# Patient Record
Sex: Female | Born: 1945 | Race: White | Hispanic: No | Marital: Married | Smoking: Former smoker
Health system: Southern US, Community
[De-identification: ages and names within clinical notes are randomized; demographics above are authoritative.]

## PROBLEM LIST (undated history)

## (undated) DIAGNOSIS — R609 Edema, unspecified: Secondary | ICD-10-CM

## (undated) DIAGNOSIS — Z720 Tobacco use: Secondary | ICD-10-CM

## (undated) DIAGNOSIS — T8859XA Other complications of anesthesia, initial encounter: Secondary | ICD-10-CM

## (undated) DIAGNOSIS — I4892 Unspecified atrial flutter: Secondary | ICD-10-CM

## (undated) DIAGNOSIS — E785 Hyperlipidemia, unspecified: Secondary | ICD-10-CM

## (undated) DIAGNOSIS — R112 Nausea with vomiting, unspecified: Secondary | ICD-10-CM

## (undated) DIAGNOSIS — Z9889 Other specified postprocedural states: Secondary | ICD-10-CM

## (undated) DIAGNOSIS — Z9289 Personal history of other medical treatment: Secondary | ICD-10-CM

## (undated) DIAGNOSIS — I5032 Chronic diastolic (congestive) heart failure: Secondary | ICD-10-CM

## (undated) DIAGNOSIS — I864 Gastric varices: Secondary | ICD-10-CM

## (undated) DIAGNOSIS — K7469 Other cirrhosis of liver: Secondary | ICD-10-CM

## (undated) DIAGNOSIS — IMO0001 Reserved for inherently not codable concepts without codable children: Secondary | ICD-10-CM

## (undated) DIAGNOSIS — K219 Gastro-esophageal reflux disease without esophagitis: Secondary | ICD-10-CM

## (undated) DIAGNOSIS — D649 Anemia, unspecified: Secondary | ICD-10-CM

## (undated) DIAGNOSIS — R188 Other ascites: Secondary | ICD-10-CM

## (undated) DIAGNOSIS — T4145XA Adverse effect of unspecified anesthetic, initial encounter: Secondary | ICD-10-CM

## (undated) DIAGNOSIS — Q249 Congenital malformation of heart, unspecified: Secondary | ICD-10-CM

## (undated) DIAGNOSIS — D136 Benign neoplasm of pancreas: Secondary | ICD-10-CM

## (undated) DIAGNOSIS — E119 Type 2 diabetes mellitus without complications: Secondary | ICD-10-CM

## (undated) DIAGNOSIS — R0600 Dyspnea, unspecified: Secondary | ICD-10-CM

## (undated) HISTORY — PX: CHOLECYSTECTOMY: SHX55

## (undated) HISTORY — PX: OTHER SURGICAL HISTORY: SHX169

## (undated) HISTORY — DX: Hyperlipidemia, unspecified: E78.5

## (undated) HISTORY — PX: ABDOMINAL HYSTERECTOMY: SHX81

## (undated) HISTORY — PX: HERNIA REPAIR: SHX51

## (undated) HISTORY — DX: Anemia, unspecified: D64.9

## (undated) HISTORY — DX: Reserved for inherently not codable concepts without codable children: IMO0001

## (undated) HISTORY — DX: Type 2 diabetes mellitus without complications: E11.9

## (undated) HISTORY — DX: Edema, unspecified: R60.9

## (undated) HISTORY — DX: Congenital malformation of heart, unspecified: Q24.9

---

## 1977-02-15 HISTORY — PX: OTHER SURGICAL HISTORY: SHX169

## 1984-02-16 HISTORY — PX: BACK SURGERY: SHX140

## 2001-02-01 ENCOUNTER — Ambulatory Visit (HOSPITAL_COMMUNITY): Admission: RE | Admit: 2001-02-01 | Discharge: 2001-02-01 | Payer: Self-pay | Admitting: Emergency Medicine

## 2001-02-01 ENCOUNTER — Encounter: Payer: Self-pay | Admitting: Emergency Medicine

## 2001-02-17 ENCOUNTER — Encounter: Payer: Self-pay | Admitting: Neurosurgery

## 2001-02-17 ENCOUNTER — Ambulatory Visit (HOSPITAL_COMMUNITY): Admission: RE | Admit: 2001-02-17 | Discharge: 2001-02-18 | Payer: Self-pay | Admitting: Neurosurgery

## 2001-08-28 ENCOUNTER — Encounter: Admission: RE | Admit: 2001-08-28 | Discharge: 2001-11-26 | Payer: Self-pay | Admitting: Family Medicine

## 2002-02-06 ENCOUNTER — Encounter: Payer: Self-pay | Admitting: Family Medicine

## 2002-02-06 ENCOUNTER — Encounter: Admission: RE | Admit: 2002-02-06 | Discharge: 2002-02-06 | Payer: Self-pay | Admitting: Family Medicine

## 2002-02-14 ENCOUNTER — Encounter: Payer: Self-pay | Admitting: Family Medicine

## 2002-02-14 ENCOUNTER — Encounter: Admission: RE | Admit: 2002-02-14 | Discharge: 2002-02-14 | Payer: Self-pay | Admitting: Family Medicine

## 2002-08-13 ENCOUNTER — Encounter: Payer: Self-pay | Admitting: Emergency Medicine

## 2002-08-13 ENCOUNTER — Encounter: Admission: RE | Admit: 2002-08-13 | Discharge: 2002-08-13 | Payer: Self-pay | Admitting: Emergency Medicine

## 2002-08-28 ENCOUNTER — Encounter: Payer: Self-pay | Admitting: Surgery

## 2002-09-05 ENCOUNTER — Observation Stay (HOSPITAL_COMMUNITY): Admission: RE | Admit: 2002-09-05 | Discharge: 2002-09-06 | Payer: Self-pay | Admitting: Surgery

## 2002-09-05 ENCOUNTER — Encounter: Payer: Self-pay | Admitting: Surgery

## 2002-09-05 ENCOUNTER — Encounter (INDEPENDENT_AMBULATORY_CARE_PROVIDER_SITE_OTHER): Payer: Self-pay | Admitting: Specialist

## 2002-12-05 ENCOUNTER — Emergency Department (HOSPITAL_COMMUNITY): Admission: EM | Admit: 2002-12-05 | Discharge: 2002-12-05 | Payer: Self-pay | Admitting: Emergency Medicine

## 2002-12-05 ENCOUNTER — Encounter: Payer: Self-pay | Admitting: Emergency Medicine

## 2003-08-26 ENCOUNTER — Ambulatory Visit (HOSPITAL_BASED_OUTPATIENT_CLINIC_OR_DEPARTMENT_OTHER): Admission: RE | Admit: 2003-08-26 | Discharge: 2003-08-26 | Payer: Self-pay | Admitting: Surgery

## 2003-08-26 ENCOUNTER — Ambulatory Visit (HOSPITAL_COMMUNITY): Admission: RE | Admit: 2003-08-26 | Discharge: 2003-08-26 | Payer: Self-pay | Admitting: Surgery

## 2004-05-04 ENCOUNTER — Emergency Department (HOSPITAL_COMMUNITY): Admission: EM | Admit: 2004-05-04 | Discharge: 2004-05-04 | Payer: Self-pay | Admitting: Emergency Medicine

## 2005-11-18 ENCOUNTER — Encounter: Admission: RE | Admit: 2005-11-18 | Discharge: 2005-11-18 | Payer: Self-pay | Admitting: Emergency Medicine

## 2005-11-26 ENCOUNTER — Encounter: Admission: RE | Admit: 2005-11-26 | Discharge: 2005-11-26 | Payer: Self-pay | Admitting: Emergency Medicine

## 2006-02-15 DIAGNOSIS — IMO0001 Reserved for inherently not codable concepts without codable children: Secondary | ICD-10-CM

## 2006-02-15 HISTORY — DX: Reserved for inherently not codable concepts without codable children: IMO0001

## 2008-12-05 ENCOUNTER — Emergency Department (HOSPITAL_COMMUNITY): Admission: EM | Admit: 2008-12-05 | Discharge: 2008-12-05 | Payer: Self-pay | Admitting: Family Medicine

## 2009-11-15 HISTORY — PX: ATRIAL ABLATION SURGERY: SHX560

## 2009-12-10 ENCOUNTER — Inpatient Hospital Stay (HOSPITAL_COMMUNITY)
Admission: AD | Admit: 2009-12-10 | Discharge: 2009-12-15 | Payer: Self-pay | Source: Home / Self Care | Admitting: Cardiology

## 2009-12-10 ENCOUNTER — Ambulatory Visit: Payer: Self-pay | Admitting: Cardiology

## 2009-12-11 ENCOUNTER — Encounter: Payer: Self-pay | Admitting: Cardiology

## 2009-12-17 ENCOUNTER — Ambulatory Visit: Payer: Self-pay | Admitting: Cardiology

## 2009-12-26 ENCOUNTER — Ambulatory Visit: Payer: Self-pay | Admitting: Cardiology

## 2009-12-30 ENCOUNTER — Ambulatory Visit: Payer: Self-pay | Admitting: Cardiology

## 2010-01-06 ENCOUNTER — Ambulatory Visit: Payer: Self-pay | Admitting: Cardiology

## 2010-01-13 ENCOUNTER — Ambulatory Visit: Payer: Self-pay | Admitting: Cardiology

## 2010-01-21 ENCOUNTER — Ambulatory Visit: Payer: Self-pay | Admitting: Cardiology

## 2010-01-30 ENCOUNTER — Ambulatory Visit (HOSPITAL_COMMUNITY)
Admission: RE | Admit: 2010-01-30 | Discharge: 2010-01-30 | Payer: Self-pay | Source: Home / Self Care | Attending: Family Medicine | Admitting: Family Medicine

## 2010-02-26 ENCOUNTER — Ambulatory Visit: Payer: Self-pay | Admitting: Cardiology

## 2010-03-04 ENCOUNTER — Ambulatory Visit (HOSPITAL_COMMUNITY)
Admission: RE | Admit: 2010-03-04 | Discharge: 2010-03-04 | Payer: Self-pay | Source: Home / Self Care | Attending: Gastroenterology | Admitting: Gastroenterology

## 2010-03-04 LAB — BASIC METABOLIC PANEL
BUN: 11 mg/dL (ref 6–23)
CO2: 27 mEq/L (ref 19–32)
Calcium: 10 mg/dL (ref 8.4–10.5)
Chloride: 104 mEq/L (ref 96–112)
Creatinine, Ser: 0.65 mg/dL (ref 0.4–1.2)
GFR calc Af Amer: >60 mL/min (ref >60)
GFR calc non Af Amer: >60 mL/min (ref >60)
Glucose, Bld: 177 mg/dL — ABNORMAL HIGH (ref 70–99)
Potassium: 4.4 mEq/L (ref 3.5–5.1)
Sodium: 140 mEq/L (ref 135–145)

## 2010-03-04 LAB — HEMOGLOBIN AND HEMATOCRIT, BLOOD
HCT: 37.6 % (ref 36.0–46.0)
Hemoglobin: 11.9 g/dL — ABNORMAL LOW (ref 12.0–15.0)

## 2010-03-07 ENCOUNTER — Encounter: Payer: Self-pay | Admitting: Emergency Medicine

## 2010-03-09 LAB — GLUCOSE, CAPILLARY: Glucose-Capillary: 276 mg/dL — ABNORMAL HIGH (ref 70–99)

## 2010-04-29 LAB — COMPREHENSIVE METABOLIC PANEL
ALT: 53 U/L — ABNORMAL HIGH (ref 0–35)
AST: 50 U/L — ABNORMAL HIGH (ref 0–37)
Albumin: 3.7 g/dL (ref 3.5–5.2)
Alkaline Phosphatase: 70 U/L (ref 39–117)
BUN: 9 mg/dL (ref 6–23)
CO2: 28 mEq/L (ref 19–32)
Calcium: 9.2 mg/dL (ref 8.4–10.5)
Chloride: 106 mEq/L (ref 96–112)
Creatinine, Ser: 0.65 mg/dL (ref 0.4–1.2)
GFR calc Af Amer: >60 mL/min (ref >60)
GFR calc non Af Amer: >60 mL/min (ref >60)
Glucose, Bld: 142 mg/dL — ABNORMAL HIGH (ref 70–99)
Potassium: 4 mEq/L (ref 3.5–5.1)
Sodium: 143 mEq/L (ref 135–145)
Total Bilirubin: 1.2 mg/dL (ref 0.3–1.2)
Total Protein: 6.8 g/dL (ref 6.0–8.3)

## 2010-04-29 LAB — CBC
HCT: 32.4 % — ABNORMAL LOW (ref 36.0–46.0)
HCT: 42.7 % (ref 36.0–46.0)
Hemoglobin: 10.8 g/dL — ABNORMAL LOW (ref 12.0–15.0)
Hemoglobin: 12.1 g/dL (ref 12.0–15.0)
Hemoglobin: 14.1 g/dL (ref 12.0–15.0)
Hemoglobin: 14.6 g/dL (ref 12.0–15.0)
MCH: 32.1 pg (ref 26.0–34.0)
MCH: 32.2 pg (ref 26.0–34.0)
MCH: 32.9 pg (ref 26.0–34.0)
MCH: 33.3 pg (ref 26.0–34.0)
MCHC: 32.4 g/dL (ref 30.0–36.0)
MCHC: 33.3 g/dL (ref 30.0–36.0)
MCHC: 33.5 g/dL (ref 30.0–36.0)
MCHC: 33.5 g/dL (ref 30.0–36.0)
MCHC: 34.2 g/dL (ref 30.0–36.0)
MCV: 96.1 fL (ref 78.0–100.0)
MCV: 97.3 fL (ref 78.0–100.0)
Platelets: 137 10*3/uL — ABNORMAL LOW (ref 150–400)
Platelets: 143 10*3/uL — ABNORMAL LOW (ref 150–400)
Platelets: 153 10*3/uL (ref 150–400)
Platelets: 159 10*3/uL (ref 150–400)
RBC: 3.66 MIL/uL — ABNORMAL LOW (ref 3.87–5.11)
RBC: 4.38 MIL/uL (ref 3.87–5.11)
RBC: 4.39 MIL/uL (ref 3.87–5.11)
RDW: 14.5 % (ref 11.5–15.5)
RDW: 15 % (ref 11.5–15.5)
RDW: 15 % (ref 11.5–15.5)
WBC: 7.8 10*3/uL (ref 4.0–10.5)
WBC: 8 10*3/uL (ref 4.0–10.5)
WBC: 8.7 10*3/uL (ref 4.0–10.5)

## 2010-04-29 LAB — HEPARIN LEVEL (UNFRACTIONATED)
Heparin Unfractionated: 0.16 IU/mL — ABNORMAL LOW (ref 0.30–0.70)
Heparin Unfractionated: 0.26 IU/mL — ABNORMAL LOW (ref 0.30–0.70)
Heparin Unfractionated: 0.32 IU/mL (ref 0.30–0.70)
Heparin Unfractionated: 0.32 IU/mL (ref 0.30–0.70)
Heparin Unfractionated: 0.46 IU/mL (ref 0.30–0.70)

## 2010-04-29 LAB — CARDIAC PANEL(CRET KIN+CKTOT+MB+TROPI)
CK, MB: 1 ng/mL (ref 0.3–4.0)
CK, MB: 1.4 ng/mL (ref 0.3–4.0)
Relative Index: INVALID (ref 0.0–2.5)
Relative Index: INVALID (ref 0.0–2.5)
Relative Index: INVALID (ref 0.0–2.5)
Total CK: 67 U/L (ref 7–177)
Total CK: 75 U/L (ref 7–177)
Troponin I: 0.04 ng/mL (ref 0.00–0.06)
Troponin I: <0.01 ng/mL (ref 0.00–0.06)

## 2010-04-29 LAB — BASIC METABOLIC PANEL
CO2: 28 mEq/L (ref 19–32)
Calcium: 8.4 mg/dL (ref 8.4–10.5)
Glucose, Bld: 180 mg/dL — ABNORMAL HIGH (ref 70–99)
Potassium: 4.4 mEq/L (ref 3.5–5.1)
Sodium: 140 mEq/L (ref 135–145)

## 2010-04-29 LAB — HEMOCCULT GUIAC POC 1CARD (OFFICE): Fecal Occult Bld: POSITIVE

## 2010-04-29 LAB — GLUCOSE, CAPILLARY
Glucose-Capillary: 161 mg/dL — ABNORMAL HIGH (ref 70–99)
Glucose-Capillary: 196 mg/dL — ABNORMAL HIGH (ref 70–99)
Glucose-Capillary: 209 mg/dL — ABNORMAL HIGH (ref 70–99)
Glucose-Capillary: 210 mg/dL — ABNORMAL HIGH (ref 70–99)
Glucose-Capillary: 221 mg/dL — ABNORMAL HIGH (ref 70–99)
Glucose-Capillary: 228 mg/dL — ABNORMAL HIGH (ref 70–99)
Glucose-Capillary: 232 mg/dL — ABNORMAL HIGH (ref 70–99)
Glucose-Capillary: 234 mg/dL — ABNORMAL HIGH (ref 70–99)
Glucose-Capillary: 249 mg/dL — ABNORMAL HIGH (ref 70–99)
Glucose-Capillary: 262 mg/dL — ABNORMAL HIGH (ref 70–99)
Glucose-Capillary: 284 mg/dL — ABNORMAL HIGH (ref 70–99)

## 2010-04-29 LAB — PROTIME-INR
INR: 1.21 (ref 0.00–1.49)
Prothrombin Time: 15.5 seconds — ABNORMAL HIGH (ref 11.6–15.2)
Prothrombin Time: 20.3 seconds — ABNORMAL HIGH (ref 11.6–15.2)

## 2010-04-29 LAB — MRSA PCR SCREENING: MRSA by PCR: NEGATIVE

## 2010-04-29 LAB — TSH: TSH: 1.637 u[IU]/mL (ref 0.350–4.500)

## 2010-04-29 LAB — APTT: aPTT: >200 seconds (ref 24–37)

## 2010-05-21 LAB — CBC
HCT: 42.7 % (ref 36.0–46.0)
MCV: 96.5 fL (ref 78.0–100.0)
RBC: 4.42 MIL/uL (ref 3.87–5.11)
WBC: 6.8 10*3/uL (ref 4.0–10.5)

## 2010-05-21 LAB — BASIC METABOLIC PANEL
Chloride: 102 mEq/L (ref 96–112)
GFR calc Af Amer: >60 mL/min (ref >60)
Potassium: 4.5 mEq/L (ref 3.5–5.1)
Sodium: 139 mEq/L (ref 135–145)

## 2010-05-21 LAB — DIFFERENTIAL
Eosinophils Absolute: 0.1 10*3/uL (ref 0.0–0.7)
Eosinophils Relative: 1 % (ref 0–5)
Lymphs Abs: 1.6 10*3/uL (ref 0.7–4.0)
Monocytes Relative: 8 % (ref 3–12)

## 2010-05-21 LAB — POCT CARDIAC MARKERS
Myoglobin, poc: 72.1 ng/mL (ref 12–200)
Troponin i, poc: <0.05 ng/mL (ref 0.00–0.09)

## 2010-06-25 ENCOUNTER — Ambulatory Visit (INDEPENDENT_AMBULATORY_CARE_PROVIDER_SITE_OTHER): Payer: 59 | Admitting: Cardiology

## 2010-06-25 ENCOUNTER — Encounter: Payer: Self-pay | Admitting: Cardiology

## 2010-06-25 ENCOUNTER — Encounter: Payer: Self-pay | Admitting: *Deleted

## 2010-06-25 DIAGNOSIS — R609 Edema, unspecified: Secondary | ICD-10-CM

## 2010-06-25 DIAGNOSIS — Q249 Congenital malformation of heart, unspecified: Secondary | ICD-10-CM

## 2010-06-25 DIAGNOSIS — E1142 Type 2 diabetes mellitus with diabetic polyneuropathy: Secondary | ICD-10-CM | POA: Insufficient documentation

## 2010-06-25 DIAGNOSIS — E119 Type 2 diabetes mellitus without complications: Secondary | ICD-10-CM

## 2010-06-25 DIAGNOSIS — I4892 Unspecified atrial flutter: Secondary | ICD-10-CM

## 2010-06-25 DIAGNOSIS — E785 Hyperlipidemia, unspecified: Secondary | ICD-10-CM | POA: Insufficient documentation

## 2010-06-25 MED ORDER — METOPROLOL TARTRATE 50 MG PO TABS: 50.0000 mg | ORAL_TABLET | Freq: Every day | ORAL | Status: DC

## 2010-06-25 NOTE — Assessment & Plan Note (Signed)
No documented recurrence of atrial flutter. She has had no sustained arrhythmias. I would continue with her beta blocker therapy. She is cleared for her planned esophageal surgery.

## 2010-06-25 NOTE — Patient Instructions (Signed)
You are cleared for esophageal surgery  Continue medications.  We will see you back in 6 months.

## 2010-06-25 NOTE — Assessment & Plan Note (Signed)
The type of her previous repair is on none. We have no old records and the patient does not recall. She is asymptomatic. Prior echocardiograms have been unremarkable.

## 2010-06-25 NOTE — Progress Notes (Signed)
   Natalie Lane Date of Birth: [birth year]   History of Present Illness: Natalie Lane is seen for preoperative clearance today for removal of a large esophageal polyp. She has done well from a cardiac standpoint. She does note an occasional flutter sensation in her chest that may last a few seconds. She is not sure whether this is related to her heart rhythm or to indigestion. She does have a lot of belching associated with this. She has had no sustained arrhythmias.  Current Outpatient Prescriptions on File Prior to Visit  Medication Sig Dispense Refill  . Acetaminophen (TYLENOL PO) Take by mouth as needed.        Marland Kitchen aspirin 81 MG tablet Take 81 mg by mouth daily.        . DiphenhydrAMINE HCl (BENADRYL PO) Take 1 tablet by mouth as needed.        . furosemide (LASIX) 20 MG tablet Take 20 mg by mouth as needed.        . metFORMIN (GLUCOPHAGE) 1000 MG tablet Take 1,000 mg by mouth 2 (two) times daily with a meal.        . Ranitidine HCl (ZANTAC PO) Take 120 mg by mouth as needed.        . Simethicone (GAS-X PO) Take 1 tablet by mouth as needed.        . vitamin B-12 (CYANOCOBALAMIN) 1000 MCG tablet Take 1,000 mcg by mouth daily.          Allergies  Allergen Reactions  . Erythromycin   . Penicillins   . Red Dye     Past Medical History  Diagnosis Date  . Atrial flutter   . 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 Edema   . Diabetes mellitus, type 2   . Hyperlipidemia     Past Surgical History  Procedure Date  . Asd versus vsd repair   . Atrial flutter ablation     History  Smoking status  . Former Smoker -- 1.0 packs/day for 36 years  . Types: Cigarettes  . Quit date: 06/25/2007  Smokeless tobacco  . Never Used    History  Alcohol Use No    Family History  Problem Relation Age of Onset  . Kidney disease Sister   . Depression Paternal Grandfather   . Diabetes Brother   . Hypertension Mother     Review of Systems: The  review of systems is positive for belching and gas.  All other systems were reviewed and are negative.  Physical Exam: BP 134/76  Pulse 64 She is an overweight white female in no acute distress. Her HEENT exam is unremarkable. She has no JVD, adenopathy, thyromegaly, or bruits. Lungs are clear. Cardiac exam reveals a regular rate and rhythm without gallop, murmur, or click. Abdomen is obese, soft, nontender. She has no edema. LABORATORY DATA:   Assessment / Plan:

## 2010-07-03 ENCOUNTER — Other Ambulatory Visit: Payer: Self-pay | Admitting: Family Medicine

## 2010-07-03 NOTE — Op Note (Signed)
NAMEGORDIE, Natalie Lane                         ACCOUNT NO.:  [id number]   MEDICAL RECORD NO.:  [id number]                   PATIENT TYPE:  AMB   LOCATION:  DAY                                  FACILITY:  [facility]   PHYSICIAN:  Currie Paris, M.D.           DATE OF BIRTH:  01-Dec-1945   DATE OF PROCEDURE:  09/05/2002  DATE OF DISCHARGE:                                 OPERATIVE REPORT   OFFICE MEDICAL RECORD NUMBER:  [id number].   PREOPERATIVE DIAGNOSIS:  Chronic cholecystitis.   POSTOPERATIVE DIAGNOSES:  1. Chronic cholecystitis.  2. Question cirrhosis.   OPERATION/PROCEDURE:  Laparoscopic cholecystectomy with operative  cholangiogram.   SURGEON:  Currie Paris, M.D.   ASSISTANT:  Rose Phi. Maple Hudson, M.D.   ANESTHESIA:  General endotracheal anesthesia.   CLINICAL HISTORY:  This patient is a 65 year old lady who has biliary-type  symptoms and an ultrasound which has shown what could be chronic  cholecystitis with possible adenomyomatosis.  Given her symptoms, she  elected to proceed to laparoscopic cholecystectomy.   DESCRIPTION OF PROCEDURE:  The patient was seen in the holding area and had  no further questions.  She was taken to prior and after satisfactory general  endotracheal anesthesia had been obtained, the abdomen was prepped and  draped and 0.25% plain Marcaine was used for each incision.  Initial  umbilical incision made, the fascia identified and opened and the peritoneal  cavity entered under direct vision.  A pursestring was placed, the Hasson  introduced and the abdomen insufflated to 15.  There were no gross  abnormalities noted looking down toward the pelvis but they were not able to  really visualize pelvic organs.  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  It did not appear particularly shrunken, just very  nodular.  The gallbladder appeared somewhat edematous and with some evidence  of what looked like chronic inflammation but no  evidence of any acute  inflammation.   Additional trocars were placed under direct vision with epigastric 10/11 and  two 5's laterally.  The gallbladder was retracted over the liver.  Peritoneum over the cystic duct opened both anteriorly and posteriorly and  the cystic artery and what appeared to be the cystic duct and cystic artery  both dissected out.  I was able to then identify the junction of the cystic  duct with this gallbladder and then down further into the common duct.  Likewise the anterior branches of the cystic artery was seen coming up on  the gallbladder.  I made a bigger window to make sure that he had the  anatomy identified correctly.   At this point I put a clip on the cystic artery and one on the cystic duct  near the gallbladder.  The cystic duct was opened and a little bit of bile  milked out.  A Cook catheter was introduced percutaneously and placed in the  cystic duct, held with a clip and operative cholangiogram done which showed  good filling of both the hepatic and common bile duct as well as pancreatic  duct.  No stones and no filling defects.  No evidence of any obstruction.  Good flow under the duodenum and a fairly long cystic duct with filling up  into the hepatic radicals.   Cystic duct catheter was removed.  We insufflated the abdomen.  There is a  fair amount of blood that clotted and this was all suctioned out.  There  were some fresh clots.  Once we put three clips on the cystic duct stay side  and divided it.  Two additional clips on the anterior branch of the cystic  artery and divided.  We found some oozing where we had opened the peritoneum  posteriorly and clipped the posterior branch of the artery and divided it.  The bleeding ceased.  The gallbladder was then removed from below to above  with another vessel, crossing into the gallbladder, the gallbladder clipped  and divided.  Just prior to disconnecting the irrigator, we irrigated and  made  sure it was dry and took some photographs of the liver for  documentation of what appeared to be some nodular cirrhosis.  The  gallbladder was then disconnected and brought out the umbilical port.  We  irrigated and made sure that it was dry and then removed the lateral ports  under direct vision.  The umbilical port was closed with a pursestring. The  abdomen was deflated through the epigastric port.  Skin was closed with 4-0  Monocryl subcuticular and Dermabond.   The patient tolerated the procedure well with no obvious complications.  All  counts were correct.                                                 Currie Paris, M.D.    CJS/MEDQ  D:  09/05/2002  T:  09/05/2002  Job:  [id number]   cc:   Reuben Likes, M.D.  [address]  [place]  [place] [address]  Fax: [phone]

## 2010-07-03 NOTE — Op Note (Signed)
[facility]  Patient:    Natalie Lane, Natalie Lane Visit Number: [id number] MRN: [id number]          Service Type: DSU Location: 3000 [id number] Attending Physician:  Coletta Memos Dictated by:   Mena Goes. Franky Macho, M.D. Proc. Date: 02/17/01 Admit Date:  02/17/2001                             Operative Report  PREOPERATIVE DIAGNOSES: 1. Recurrent herniated disk L5-S1 right. 2. Right S1 radiculopathy.  POSTOPERATIVE DIAGNOSES: 1. Recurrent herniated disk L5-S1 right. 2. Right S1 radiculopathy.  PROCEDURE:  Right L5-S1 semihemilaminectomy and diskectomy with microdissection.  COMPLICATIONS:  None.  SURGEON:  Kyle L. Franky Macho, M.D.  ANESTHESIA:  General endotracheal.  INDICATIONS:  Natalie Lane is a 65 year old woman, who underwent a lumbar laminectomy and diskectomy in 1986.  She has had pain in the right lower extremity since January 27, 2001.  MRI showed a recurrent disk with a free fragment at L5-S1 on the right side.  I have recommended and she agreed to undergo operative removal of this secondary to weakness in the right gastrocnemius.  DESCRIPTION OF PROCEDURE:  Natalie Lane was brought to the operating room, intubated, and placed under general anesthesia without difficulty.  She was rolled prone onto a Wilson frame and all pressure points were properly padded. Her skin was prepped over the area of the old scar.  I infiltrated 10 cc of 0.5% lidocaine, 1:200,000 strength epinephrine into the region of the old scar.  Using the bottom third of the scar, I draped the patient sterilely.  I then made a skin incision with a #10-blade and took this down through this tissue to the thoracolumbar fascia.  I controlled some bleeding in the subcutaneous tissue with monopolar cautery.  I placed a self-retaining retractor, then exposed the lamina of what I believed to be L5 and S1.  I placed a Penfield #4 dissector inferior to the lamina of L5.  I took an  x-ray and I was at the correct position.  I then used the high-speed air drill to perform a semihemilaminectomy of L5.  I was able to remove the ligamentum flavum with Kerrison rongeurs in a rostral to caudal direction.  I brought the microscope into position and used that for microdissection.  I then retracted the thecal sac and the S1 nerve root and a large piece of disk extruded under its own pressure.  This was removed and with further dissection using a hook, another large piece of disk was removed.  I did not feel that there was any need to go into the disk space as there was no pressure after that on the nerve roots and no other fragments.  I then irrigated the wound and closed the wound in layered fashion, reapproximating the thoracolumbar fascia with Vicryl sutures, then subcutaneous tissues with Vicryl sutures.  Sterile dressing was applied, which was Dermabond.  The patient was then rolled supine, extubated, and moving all extremities. Dictated by:   Mena Goes. Franky Macho, M.D. Attending Physician:  Coletta Memos DD:  02/17/01 TD:  02/17/01 Job: [id number]

## 2010-07-03 NOTE — Op Note (Signed)
Natalie Lane, Natalie Lane                         ACCOUNT NO.:  [id number]   MEDICAL RECORD NO.:  [id number]                   PATIENT TYPE:  AMB   LOCATION:  DSC                                  FACILITY:  [facility]   PHYSICIAN:  Currie Paris, M.D.           DATE OF BIRTH:  June 08, 1945   DATE OF PROCEDURE:  08/26/2003  DATE OF DISCHARGE:                                 OPERATIVE REPORT   CCS [id number]   PREOPERATIVE DIAGNOSIS:  Umbilical hernia, status post laparoscopic  cholecystectomy.   POSTOPERATIVE DIAGNOSIS:  Umbilical hernia, status post laparoscopic  cholecystectomy.   OPERATION PERFORMED:  Repair of umbilical hernia.   SURGEON:  Currie Paris, M.D.   ANESTHESIA:  General LMA.   INDICATIONS FOR PROCEDURE:  _____________ laparoscopic cholecystectomy about  a year ago and has presented with umbilical hernia.  It reduced easily and  appeared to contain only omentum.  She decided to have this repaired because  she was having occasional pain where it would get tender and she would have  to push it back in.   DESCRIPTION OF PROCEDURE:  The patient was seen in the holding area and had  no further questions.  The patient was taken to the operating room and after  satisfactory general LMA anesthesia was obtained, the abdomen was prepped  and draped.  I injected 0.25% plain Marcaine around the area and used the  old vertical umbilical incision.  I immediately entered the sac which had  omentum and the defect apparently went from the umbilicus inferiorly and so  I extended the incision a little bit inferiorly.  I freed up the omentum off  the subcutaneous tissue and off of the fascial edges and reduced it.  The  defect was about 2.5 cm long.  Once this was cleared off and the fascia  identified, I cleaned off the fascia from the subcutaneous so that I had an  area of about 2 cm in all directions around the defect.  Fascia exposed.  By  palpation there were no other fascial  defects within the reach of my finger.  The fascial defect was closed with interrupted 0 Prolenes leaving the middle  and the two ends tied but uncut.  I then placed a circular piece of mesh  over the defect anchoring it with the three previously placed Prolenes in  the midline.  It was then tacked circumferentially to the edges of the  fascia so that it extended well beyond the level of the defect.  This was also done with 0 Prolene.  Everything here appeared to be dry.  I  then closed with some 3-0 Vicryls to close the subcutaneous space and then 4-  0 Monocryl subcuticular plus Dermabond on the skin.  The patient tolerated  the procedure well.  There were no obvious immediate complication.  All  counts were correct.  Currie Paris, M.D.    CJS/MEDQ  D:  08/26/2003  T:  08/26/2003  Job:  [id number]   cc:   Reuben Likes, M.D.  [address]  [place]  [place] [address]  Fax: [phone]

## 2010-08-30 ENCOUNTER — Encounter (HOSPITAL_COMMUNITY): Payer: Self-pay

## 2010-08-30 ENCOUNTER — Emergency Department (HOSPITAL_COMMUNITY): Payer: 59

## 2010-08-30 ENCOUNTER — Emergency Department (HOSPITAL_COMMUNITY)
Admission: EM | Admit: 2010-08-30 | Discharge: 2010-08-30 | Disposition: A | Discharge status: Home / Self Care | Payer: 59 | Attending: Emergency Medicine | Admitting: Emergency Medicine

## 2010-08-30 DIAGNOSIS — M79609 Pain in unspecified limb: Secondary | ICD-10-CM | POA: Insufficient documentation

## 2010-08-30 DIAGNOSIS — S8010XA Contusion of unspecified lower leg, initial encounter: Secondary | ICD-10-CM | POA: Insufficient documentation

## 2010-08-30 DIAGNOSIS — IMO0002 Reserved for concepts with insufficient information to code with codable children: Secondary | ICD-10-CM | POA: Insufficient documentation

## 2010-08-30 DIAGNOSIS — M25559 Pain in unspecified hip: Secondary | ICD-10-CM | POA: Insufficient documentation

## 2010-08-30 DIAGNOSIS — E785 Hyperlipidemia, unspecified: Secondary | ICD-10-CM | POA: Insufficient documentation

## 2010-08-30 DIAGNOSIS — E119 Type 2 diabetes mellitus without complications: Secondary | ICD-10-CM | POA: Insufficient documentation

## 2010-08-30 DIAGNOSIS — T148XXA Other injury of unspecified body region, initial encounter: Secondary | ICD-10-CM

## 2010-08-30 DIAGNOSIS — Z79899 Other long term (current) drug therapy: Secondary | ICD-10-CM | POA: Insufficient documentation

## 2010-08-30 DIAGNOSIS — Z87891 Personal history of nicotine dependence: Secondary | ICD-10-CM | POA: Insufficient documentation

## 2010-08-30 DIAGNOSIS — W108XXA Fall (on) (from) other stairs and steps, initial encounter: Secondary | ICD-10-CM | POA: Insufficient documentation

## 2010-08-30 MED ORDER — TETANUS-DIPHTH-ACELL PERTUSSIS 5-2-15.5 LF-MCG/0.5 IM SUSP
0.5000 mL | Freq: Once | INTRAMUSCULAR | Status: AC
  Administered 2010-08-30: 0.5 mL via INTRAMUSCULAR
  Filled 2010-08-30: qty 0.5

## 2010-08-30 MED ORDER — OXYCODONE HCL 5 MG PO TABS
5.0000 mg | ORAL_TABLET | Freq: Once | ORAL | Status: AC
  Administered 2010-08-30: 5 mg via ORAL
  Filled 2010-08-30: qty 1

## 2010-08-30 MED ORDER — OXYCODONE HCL 5 MG PO TABS: ORAL_TABLET | ORAL | Status: DC

## 2010-08-30 MED ORDER — ONDANSETRON 8 MG PO TBDP
8.0000 mg | ORAL_TABLET | Freq: Once | ORAL | Status: AC
  Administered 2010-08-30: 8 mg via ORAL
  Filled 2010-08-30: qty 1

## 2010-08-30 NOTE — ED Notes (Signed)
Pt fell down stairs today and pt hit right shin. Pt has dressing over wound. Pt concerned she may have a "hairline fracture". Pt to triage via w/c.

## 2010-08-30 NOTE — ED Notes (Signed)
PA J. Idol  at bedside discussed test results and plan of care. Pt verbalized understanding.

## 2010-08-30 NOTE — ED Notes (Signed)
Pt states fell down several steps with dog in her arms this morning landing on her left hip with rt leg and elbow under her. Required assistance in getting up after fall.  Noted swelling and bruising around rt elbow, 3-4" abrasion to mid shin area on rt leg. Denies ankle and knee pain/injury.

## 2010-08-30 NOTE — ED Provider Notes (Signed)
History     Chief Complaint  Patient presents with  . Leg Pain   HPI Comments: She slipped going down a 3 step outdoor stoop letting her dog outside.  She fell with her right leg underneath her,  Yet fell onto her left hip.  She denies head injury,  Denies loc.  Patient is a 65 y.o. female presenting with leg pain.  Leg Pain  The incident occurred less than 1 hour ago. The incident occurred at home. The injury mechanism was a fall. The pain is present in the left hip and right leg. The quality of the pain is described as sharp. The pain is severe. The pain has been constant since onset. Associated symptoms include inability to bear weight. Pertinent negatives include no numbness, no muscle weakness, no loss of sensation and no tingling. She reports no foreign bodies present. The symptoms are aggravated by bearing weight. She has tried nothing for the symptoms.    Past Medical History  Diagnosis Date  . Atrial flutter   . 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 Edema   . Diabetes mellitus, type 2   . Hyperlipidemia     Past Surgical History  Procedure Date  . Asd versus vsd repair   . Atrial flutter ablation   . Ablation of dysrhythmic focus     Family History  Problem Relation Age of Onset  . Kidney disease Sister   . Depression Paternal Grandfather   . Diabetes Brother   . Hypertension Mother     History  Substance Use Topics  . Smoking status: Former Smoker -- 1.0 packs/day for 36 years    Types: Cigarettes    Quit date: 06/25/2007  . Smokeless tobacco: Never Used  . Alcohol Use: No    OB History    Grav Para Term Preterm Abortions TAB SAB Ect Mult Living                  Review of Systems  Neurological: Negative for tingling and numbness.  All other systems reviewed and are negative.    Physical Exam  BP 109/64  Pulse 66  Temp(Src) 98.4 F (36.9 C) (Oral)  Resp 18  Ht 5\' 3"  (1.6 m)  Wt 186 lb (84.369 kg)  BMI  32.95 kg/m2  SpO2 98%  Physical Exam  Vitals reviewed. Constitutional: She is oriented to person, place, and time. She appears well-developed and well-nourished.  HENT:  Head: Normocephalic and atraumatic.  Eyes: EOM are normal.  Neck: Normal range of motion. Neck supple.  Cardiovascular: Normal rate, regular rhythm and intact distal pulses.   Pulmonary/Chest: Effort normal and breath sounds normal.  Abdominal: Soft. Bowel sounds are normal. There is no tenderness.  Musculoskeletal:       Left hip: She exhibits tenderness. She exhibits normal range of motion, no swelling and no deformity.       Right lower leg: She exhibits tenderness, bony tenderness and swelling. She exhibits no edema and no deformity.       Legs:      Full pulses in right foot,  Nontender,  Less than 2 sec cap refill.  Abrasion on mid tibia is clean and dressed (by pt prior to arrival,  Neosporin in place).  Neurological: She is alert and oriented to person, place, and time.  Skin: Skin is warm and dry. Abrasion noted.     Psychiatric: She has a normal mood and affect.  ED Course  Procedures  MDM        Candis Musa, [place] 08/30/10 1302

## 2010-09-28 NOTE — ED Provider Notes (Signed)
History     CSN: [id number] Arrival date & time: 08/30/2010 11:10 AM  Chief Complaint  Patient presents with  . Leg Pain   HPI  Past Medical History  Diagnosis Date  . Atrial flutter   . 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 Edema   . Diabetes mellitus, type 2   . Hyperlipidemia     Past Surgical History  Procedure Date  . Asd versus vsd repair   . Atrial flutter ablation   . Ablation of dysrhythmic focus     Family History  Problem Relation Age of Onset  . Kidney disease Sister   . Depression Paternal Grandfather   . Diabetes Brother   . Hypertension Mother     History  Substance Use Topics  . Smoking status: Former Smoker -- 1.0 packs/day for 36 years    Types: Cigarettes    Quit date: 06/25/2007  . Smokeless tobacco: Never Used  . Alcohol Use: No    OB History    Grav Para Term Preterm Abortions TAB SAB Ect Mult Living                  Review of Systems  Physical Exam  BP 109/64  Pulse 66  Temp(Src) 98.4 F (36.9 C) (Oral)  Resp 18  Ht 5\' 3"  (1.6 m)  Wt 186 lb (84.369 kg)  BMI 32.95 kg/m2  SpO2 98%  Physical Exam  ED Course  Procedures  MDM  Evaluation and management procedures were performed by the PA/NP under my supervision/collaboration.        Felisa Bonier, MD 09/28/10 [phone]

## 2010-11-19 ENCOUNTER — Ambulatory Visit: Payer: 59 | Admitting: Nurse Practitioner

## 2010-12-02 ENCOUNTER — Encounter: Payer: Self-pay | Admitting: *Deleted

## 2010-12-02 DIAGNOSIS — D649 Anemia, unspecified: Secondary | ICD-10-CM | POA: Insufficient documentation

## 2010-12-03 ENCOUNTER — Ambulatory Visit (INDEPENDENT_AMBULATORY_CARE_PROVIDER_SITE_OTHER): Payer: 59 | Admitting: Nurse Practitioner

## 2010-12-03 ENCOUNTER — Encounter: Payer: Self-pay | Admitting: Nurse Practitioner

## 2010-12-03 VITALS — BP 122/82 | HR 76 | Ht 64.0 in | Wt 192.0 lb

## 2010-12-03 DIAGNOSIS — R609 Edema, unspecified: Secondary | ICD-10-CM

## 2010-12-03 DIAGNOSIS — Q249 Congenital malformation of heart, unspecified: Secondary | ICD-10-CM

## 2010-12-03 DIAGNOSIS — R002 Palpitations: Secondary | ICD-10-CM

## 2010-12-03 MED ORDER — METOPROLOL TARTRATE 50 MG PO TABS: ORAL_TABLET | ORAL | Status: DC

## 2010-12-03 NOTE — Progress Notes (Signed)
Natalie Lane Date of Birth: 12/07/1945 Medical Record #[id number]  History of Present Illness: Natalie Lane is seen back today for her routine visit. She is seen for Dr. [place]. She is not doing well. Her husband left her this past weekend. He had not been paying the bills. Her house is in foreclosure. She has moved in with her son. She is clearly stressed. She is having more in the way of palpitations. Happening more at night. No dizziness, presyncope or syncope. No chest pain. She will still have some occasional edema. She was given some sleeping pills but fell the first night she took them. Blood sugars have been ok. She is on metoprolol tartrate but taking once a day.    Current Outpatient Prescriptions on File Prior to Visit  Medication Sig Dispense Refill  . Acetaminophen (TYLENOL PO) Take by mouth as needed.        Marland Kitchen aspirin 81 MG tablet Take 81 mg by mouth daily.        . DiphenhydrAMINE HCl (BENADRYL PO) Take 1 tablet by mouth as needed.        . furosemide (LASIX) 20 MG tablet Take 20 mg by mouth as needed.        Marland Kitchen glipiZIDE (GLUCOTROL) 10 MG tablet Take 10 mg by mouth 1 day or 1 dose.        . metFORMIN (GLUCOPHAGE) 1000 MG tablet Take 1,000 mg by mouth 2 (two) times daily with a meal.        . Ranitidine HCl (ZANTAC PO) Take 120 mg by mouth as needed.        . Simethicone (GAS-X PO) Take 1 tablet by mouth as needed.          Allergies  Allergen Reactions  . Erythromycin   . Other     MSG  . Penicillins   . Red Dye   . Sulfa Antibiotics Hives    Past Medical History  Diagnosis Date  . Atrial flutter   . 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   . Edema   . Diabetes mellitus, type 2   . Hyperlipidemia   . Anemia   . Normal stress echocardiogram 2008    Past Surgical History  Procedure Date  . Asd versus vsd repair 1978    Operative notes not available.  . Atrial ablation surgery 10 of 2011  . Other  surgical history     partial hysterectomy  . Cholecystectomy   . Back surgery 1986  . Hernia repair     History  Smoking status  . Former Smoker -- 1.0 packs/day for 36 years  . Types: Cigarettes  . Quit date: 06/25/2007  Smokeless tobacco  . Never Used    History  Alcohol Use No    Family History  Problem Relation Age of Onset  . Kidney disease Sister   . Depression Paternal Grandfather   . Diabetes Brother   . Hypertension Mother     Review of Systems: The review of systems is per the HPI.  All other systems were reviewed and are negative.  Physical Exam: BP 122/82  Pulse 76  Ht 5\' 4"  (1.626 m)  Wt 192 lb (87.091 kg)  BMI 32.96 kg/m2 Patient is very pleasant and in no acute distress. She is obese. Skin is warm and dry. Color is normal.  HEENT is unremarkable. Normocephalic/atraumatic. PERRL. Sclera are nonicteric. Neck  is supple. No masses. No JVD. Lungs are clear. Cardiac exam shows a regular rate and rhythm. She has a soft systolic outflow murmur. Abdomen is soft. Extremities are without significant edema today. Gait and ROM are intact. No gross neurologic deficits noted.   LABORATORY DATA:   Assessment / Plan:

## 2010-12-03 NOTE — Assessment & Plan Note (Signed)
No real details are available from her remote surgery. Last stress echo was in 2008. She does have a soft murmur noted today. Will arrange for echo when I see her back.

## 2010-12-03 NOTE — Assessment & Plan Note (Signed)
She feels like this is more stress related. She is going to take her 50 mg of metoprolol in the mornings and 25 mg in the evening. I will see her back in a month. If symptoms persist, we will need to place event monitor. Patient is agreeable to this plan and will call if any problems develop in the interim.

## 2010-12-03 NOTE — Patient Instructions (Signed)
We are going to increase your metoprolol to 50 mg in the am and 25 mg in the evening  Support stockings will help with your swelling  I will see you in a month. If you are still having palpitations we will need to do more tests.

## 2010-12-03 NOTE — Assessment & Plan Note (Signed)
This has been a chronic issue. I have suggested support stockings along with her prn use of Lasix.

## 2010-12-08 ENCOUNTER — Inpatient Hospital Stay (HOSPITAL_COMMUNITY)
Admission: EM | Admit: 2010-12-08 | Discharge: 2010-12-11 | DRG: 378 | Disposition: A | Discharge status: Home / Self Care | Payer: 59 | Source: Ambulatory Visit | Attending: Internal Medicine | Admitting: Internal Medicine

## 2010-12-08 ENCOUNTER — Emergency Department (HOSPITAL_COMMUNITY): Payer: 59

## 2010-12-08 DIAGNOSIS — I10 Essential (primary) hypertension: Secondary | ICD-10-CM | POA: Diagnosis present

## 2010-12-08 DIAGNOSIS — D62 Acute posthemorrhagic anemia: Secondary | ICD-10-CM | POA: Diagnosis present

## 2010-12-08 DIAGNOSIS — K766 Portal hypertension: Secondary | ICD-10-CM | POA: Diagnosis present

## 2010-12-08 DIAGNOSIS — R0789 Other chest pain: Secondary | ICD-10-CM | POA: Diagnosis present

## 2010-12-08 DIAGNOSIS — K319 Disease of stomach and duodenum, unspecified: Secondary | ICD-10-CM | POA: Diagnosis present

## 2010-12-08 DIAGNOSIS — I4891 Unspecified atrial fibrillation: Secondary | ICD-10-CM | POA: Diagnosis present

## 2010-12-08 DIAGNOSIS — Z951 Presence of aortocoronary bypass graft: Secondary | ICD-10-CM

## 2010-12-08 DIAGNOSIS — I8510 Secondary esophageal varices without bleeding: Secondary | ICD-10-CM | POA: Diagnosis present

## 2010-12-08 DIAGNOSIS — E119 Type 2 diabetes mellitus without complications: Secondary | ICD-10-CM | POA: Diagnosis present

## 2010-12-08 DIAGNOSIS — Z7982 Long term (current) use of aspirin: Secondary | ICD-10-CM

## 2010-12-08 DIAGNOSIS — K7460 Unspecified cirrhosis of liver: Secondary | ICD-10-CM | POA: Diagnosis present

## 2010-12-08 DIAGNOSIS — E7800 Pure hypercholesterolemia, unspecified: Secondary | ICD-10-CM | POA: Diagnosis present

## 2010-12-08 DIAGNOSIS — K921 Melena: Principal | ICD-10-CM | POA: Diagnosis present

## 2010-12-08 DIAGNOSIS — J309 Allergic rhinitis, unspecified: Secondary | ICD-10-CM | POA: Diagnosis present

## 2010-12-08 LAB — BASIC METABOLIC PANEL
Calcium: 8.6 mg/dL (ref 8.4–10.5)
GFR calc non Af Amer: >90 mL/min (ref >90)
Potassium: 4 mEq/L (ref 3.5–5.1)
Sodium: 138 mEq/L (ref 135–145)

## 2010-12-08 LAB — POCT I-STAT TROPONIN I: Troponin i, poc: 0 ng/mL (ref 0.00–0.08)

## 2010-12-08 LAB — DIFFERENTIAL
Basophils Absolute: 0.1 10*3/uL (ref 0.0–0.1)
Basophils Relative: 1 % (ref 0–1)
Eosinophils Absolute: 0.2 10*3/uL (ref 0.0–0.7)
Lymphocytes Relative: 36 % (ref 12–46)
Monocytes Relative: 12 % (ref 3–12)
Neutrophils Relative %: 48 % (ref 43–77)

## 2010-12-08 LAB — CBC
Hemoglobin: 5 g/dL — CL (ref 12.0–15.0)
MCV: 81.5 fL (ref 78.0–100.0)
Platelets: 173 10*3/uL (ref 150–400)
RBC: 2.11 MIL/uL — ABNORMAL LOW (ref 3.87–5.11)
WBC: 8.2 10*3/uL (ref 4.0–10.5)

## 2010-12-08 LAB — ABO/RH: ABO/RH(D): AB POS

## 2010-12-08 LAB — GLUCOSE, CAPILLARY: Glucose-Capillary: 195 mg/dL — ABNORMAL HIGH (ref 70–99)

## 2010-12-08 LAB — OCCULT BLOOD, POC DEVICE: Fecal Occult Bld: POSITIVE

## 2010-12-09 LAB — CBC
HCT: 26.5 % — ABNORMAL LOW (ref 36.0–46.0)
Hemoglobin: 8.5 g/dL — ABNORMAL LOW (ref 12.0–15.0)
MCH: 25.8 pg — ABNORMAL LOW (ref 26.0–34.0)
MCHC: 32.1 g/dL (ref 30.0–36.0)
MCV: 79.2 fL (ref 78.0–100.0)
MCV: 80.5 fL (ref 78.0–100.0)
Platelets: 144 10*3/uL — ABNORMAL LOW (ref 150–400)
RDW: 15.7 % — ABNORMAL HIGH (ref 11.5–15.5)
WBC: 5.8 10*3/uL (ref 4.0–10.5)

## 2010-12-09 LAB — GLUCOSE, CAPILLARY: Glucose-Capillary: 225 mg/dL — ABNORMAL HIGH (ref 70–99)

## 2010-12-09 LAB — PREPARE RBC (CROSSMATCH)

## 2010-12-09 LAB — MRSA PCR SCREENING: MRSA by PCR: NEGATIVE

## 2010-12-09 LAB — BASIC METABOLIC PANEL
BUN: 13 mg/dL (ref 6–23)
Creatinine, Ser: 0.5 mg/dL (ref 0.50–1.10)
GFR calc Af Amer: >90 mL/min (ref >90)
GFR calc non Af Amer: >90 mL/min (ref >90)
Potassium: 3.8 mEq/L (ref 3.5–5.1)

## 2010-12-09 LAB — HEMOGLOBIN A1C
Hgb A1c MFr Bld: 7.1 % — ABNORMAL HIGH (ref <5.7)
Mean Plasma Glucose: 157 mg/dL — ABNORMAL HIGH (ref <117)

## 2010-12-10 LAB — COMPREHENSIVE METABOLIC PANEL
CO2: 23 mEq/L (ref 19–32)
Calcium: 8.4 mg/dL (ref 8.4–10.5)
Chloride: 105 mEq/L (ref 96–112)
Creatinine, Ser: 0.48 mg/dL — ABNORMAL LOW (ref 0.50–1.10)
GFR calc Af Amer: >90 mL/min (ref >90)
GFR calc non Af Amer: >90 mL/min (ref >90)
Glucose, Bld: 221 mg/dL — ABNORMAL HIGH (ref 70–99)
Total Bilirubin: 0.7 mg/dL (ref 0.3–1.2)

## 2010-12-10 LAB — GLUCOSE, CAPILLARY
Glucose-Capillary: 162 mg/dL — ABNORMAL HIGH (ref 70–99)
Glucose-Capillary: 185 mg/dL — ABNORMAL HIGH (ref 70–99)

## 2010-12-10 LAB — CBC
HCT: 28.3 % — ABNORMAL LOW (ref 36.0–46.0)
Hemoglobin: 8.9 g/dL — ABNORMAL LOW (ref 12.0–15.0)
MCH: 25.3 pg — ABNORMAL LOW (ref 26.0–34.0)
MCV: 80.4 fL (ref 78.0–100.0)
RBC: 3.52 MIL/uL — ABNORMAL LOW (ref 3.87–5.11)

## 2010-12-10 LAB — CROSSMATCH
ABO/RH(D): AB POS
Antibody Screen: NEGATIVE
Unit division: 0

## 2010-12-11 LAB — COMPREHENSIVE METABOLIC PANEL
Albumin: 3 g/dL — ABNORMAL LOW (ref 3.5–5.2)
Alkaline Phosphatase: 56 U/L (ref 39–117)
BUN: 6 mg/dL (ref 6–23)
CO2: 24 mEq/L (ref 19–32)
Chloride: 107 mEq/L (ref 96–112)
Creatinine, Ser: 0.58 mg/dL (ref 0.50–1.10)
GFR calc Af Amer: >90 mL/min (ref >90)
GFR calc non Af Amer: >90 mL/min (ref >90)
Glucose, Bld: 171 mg/dL — ABNORMAL HIGH (ref 70–99)
Potassium: 3.9 mEq/L (ref 3.5–5.1)
Total Bilirubin: 0.7 mg/dL (ref 0.3–1.2)

## 2010-12-11 LAB — CBC
Hemoglobin: 8.7 g/dL — ABNORMAL LOW (ref 12.0–15.0)
MCH: 25.5 pg — ABNORMAL LOW (ref 26.0–34.0)
MCHC: 31 g/dL (ref 30.0–36.0)
MCV: 82.4 fL (ref 78.0–100.0)

## 2010-12-11 LAB — PROTIME-INR: Prothrombin Time: 16.8 seconds — ABNORMAL HIGH (ref 11.6–15.2)

## 2010-12-11 LAB — GLUCOSE, CAPILLARY
Glucose-Capillary: 167 mg/dL — ABNORMAL HIGH (ref 70–99)
Glucose-Capillary: 181 mg/dL — ABNORMAL HIGH (ref 70–99)
Glucose-Capillary: 229 mg/dL — ABNORMAL HIGH (ref 70–99)

## 2010-12-14 NOTE — H&P (Signed)
NAMEMARVELLE, SPAN               ACCOUNT NO.:  [id number]  MEDICAL RECORD NO.:  [id number]  LOCATION:  3313                         FACILITY:  [facility]  PHYSICIAN:  Thomasenia Bottoms, MDDATE OF BIRTH:  1945/03/12  DATE OF ADMISSION:  12/08/2010 DATE OF DISCHARGE:                             HISTORY & PHYSICAL   PRIMARY CARE PHYSICIAN:  Tammy R. Collins Scotland, MD  GASTROENTEROLOGIST:  Shirley Friar, MD at [facility]  CHIEF COMPLAINT:  Black tarry stools for the last 3 days.  HISTORY OF PRESENTING ILLNESS:  Natalie Lane is a pleasant 65 year old woman who presents today to the emergency department because of chest pressure.  This is in the setting of having black tarry stools for the last 3 days.  She has been feeling very weak, very dizzy.  She has had significant shortness of breath and then today she developed chest pressure that was enough to scare her to finally come in.  She has had 1 episode of black tarry stools before about a year ago.  She had a colonoscopy and EGD at that time.  She tells me she was found to have a polyp that was difficult to reach, so she was sent to [facility] where the polyp was removed.  Since that time, she has not had any trouble with black tarry stools.  She denies taking any NSAID.  She said she took 1 Aleve today, but that is the first one she had had in some time.  PAST MEDICAL HISTORY:  Significant for atrial fibrillation.  She is actually status post cardioversion about 8-9 months ago.  Dr. Swaziland is her cardiologist.  She has history of polyp removal.  She says the polyp was near her pancreas that was removed at [facility] but she is to follow up with Dr. Bosie Clos.  She has a history of bypass surgery in 1978, history of diabetes mellitus type 2, hypertension, hypercholesterolemia.  MEDICATIONS ON ARRIVAL: 1. Over-the-counter Tums. 2. Zantac over-the-counter as needed. 3. Over-the-counter eye moisturizing drops. 4. Diphenhydramine  over-the-counter. 5. Benadryl 25 mg p.o. as needed daily. 6. Glucotrol XL 10 mg p.o. daily. 7. Furosemide 20 mg p.o. daily. 8. Aspirin 81 mg p.o. daily. 9. Metformin 1000 mg p.o. twice daily. 10.Metoprolol 50 mg 1 tablet in the morning and she was just increased     to an additional half tablet at night. 11.The patient also takes fluocinonide topical 0.05% twice daily.  SOCIAL HISTORY:  She does not smoke cigarettes, drink alcohol, or use any illicit drugs.  She works in Clinical biochemist.  FAMILY HISTORY:  Significant for father who had liver cancer and aunt had stomach cancer.  Sister had kidney failure.  REVIEW OF SYSTEMS:  The patient has felt terrible in the last 3 days. Please see HPI.  Prior to that, however, she did have trouble with diarrhea alternating with constipation, but no significant lower extremity edema, chest pain, shortness of breath, fevers, chills.  Her appetite was fine in the past.  The patient is undergoing a lot of stress recently because of separation from her husband.  All other systems reviewed and are negative.  PHYSICAL EXAMINATION:  VITAL SIGNS:  In the emergency department  on arrival, her temperature is 98.8; her initial blood pressure 107/58, later 117/56; pulse 105; respiratory rate 16; O2 sats 100% on 2 L nasal cannula. GENERAL:  The patient is well nourished, well developed, and in no acute distress.  She is pale. HEENT:  Normocephalic, atraumatic.  Her pupils are equal and round.  Her sclerae are nonicteric.  Oral mucosa moist.  Conjunctivae pale, but nonicteric sclerae. NECK:  Supple.  No lymphadenopathy.  No thyromegaly.  No jugular venous distention. CARDIAC:  Regular rate and rhythm. LUNGS:  Clear to auscultation bilaterally.  No wheezes, rhonchi, or rales. ABDOMEN:  Soft.  She has some mild tenderness in the midepigastric area, but no rebound or guarding.  No hepatosplenomegaly appreciated. EXTREMITIES:  No evidence of clubbing,  cyanosis, or pitting edema.  She does have palpable DP pulses bilaterally. SKIN:  Warm, dry, and intact.  No open lesions or rashes. MUSCULOSKELETAL:  No evidence of effusion of her joints. NEURO:  She has normal muscle tone and bulk.  DATA:  The patient's EKG reveals sinus tachycardia.  The rate was 103 on the EKG.  She has early R-wave progression, but no ST-segment elevation or depression.  I did review her EKG.  She does have deep T-wave inversions in the septal leads, but this is unchanged from her EKG from 2011.  The patient's sodium is 138, potassium 4.0, chloride 105, bicarb 21, glucose is 232, BUN 20, creatinine 0.5.  Her white count is 8.2, hemoglobin is 5.0, hematocrit is 17.2, platelet count is 173.  Her stool was green in color, but positive on her fecal occult blood test.  Her troponin 0.0.  ASSESSMENT AND PLAN: 1. Gastrointestinal bleed with severe symptomatic hemorrhagic anemia.     The plan is to admit her to a step-down bed.  We will transfuse 3     units of packed red blood cells to start.  I have consulted [facility] to see her.  We will make her n.p.o., follow her hemoglobins.     We will put her on IV Protonix empirically.  Certainly, we will     hold her aspirin and her metoprolol for now and we will monitor     carefully. 2. Diabetes mellitus.  Her blood sugar is high, but she will be n.p.o.     and certainly her metformin is going to be held for now and we will     monitor. 3. Hypercholesterolemia.  All of her medications will be held in the     short term. 4. Hypertension.  As above, her metoprolol will be held for now.  The     patient's hemoglobin is quite low, but hemodynamically she seems to     be holding her own.  We will follow her carefully. All this was discussed with the patient and her family.     Thomasenia Bottoms, MD     CVC/MEDQ  D:  12/08/2010  T:  12/09/2010  Job:  [id number]  cc:   Tammy R. Collins Scotland, M.D. Shirley Friar,  MD Peter M. Swaziland, M.D.  Electronically Signed by Buena Irish MD on 12/14/2010 09:46:19 AM

## 2010-12-14 NOTE — Op Note (Signed)
  Natalie Lane, COBURN NO.:  [id number]  MEDICAL RECORD NO.:  [id number]  LOCATION:  3313                         FACILITY:  [facility]  PHYSICIAN:  Graylin Shiver, M.D.   DATE OF BIRTH:  05/31/1945  DATE OF PROCEDURE:  12/09/2010 DATE OF DISCHARGE:                              OPERATIVE REPORT   INDICATION FOR PROCEDURE:  Melena and anemia rule out upper GI lesion.  Informed consent was obtained after explanation of the risks of bleeding, infection and perforation.  PREMEDICATIONS:  Fentanyl 100 mcg IV, Versed 8 mg IV.  PROCEDURE:  With the patient in the left lateral decubitus position, the Pentax gastroscope was inserted into the oropharynx and passed into the esophagus.  It was advanced down the esophagus, then into the stomach and into the duodenum.  The second portion of the duodenum looked normal, and the bulb of the duodenum looked normal.  The stomach showed some mild portal hypertensive gastropathy, but no evidence of ulcers, erosions or any signs of active bleeding.  There was a little bit of friability in the upper portion of the stomach where the scope rubbed across with advancing it but this was not present on initial advancement of the scope.  There were no obvious lesions seen in the duodenum or the stomach to explain the bleeding.  The esophagus revealed small distal esophageal varices but no stigmata of bleeding.  The mid and proximal esophagus looked normal.  The scope was removed, and then I reinserted a lateral viewing duodenoscope and advanced it down to the papilla of Vater region to look at the site where the previous polypectomy from the ampulla of Vater was.  This area looked fine with no ulcers, no redness, no bleeding, and it did not appear that there was any hemobilia either. I could see yellow bile draining from the papillary area.  The scope was removed.  She tolerated the procedure well without complications.  IMPRESSION: 1.  Distal esophageal varices, but no signs of active bleeding or     stigmata of bleeding. 2. Mild portal hypertensive gastropathy, but no evidence of active     bleeding. 3. Prior site of ampullary polypectomy looks good with no evidence of     bleeding, and there is no evidence of hemobilia.          ______________________________ Graylin Shiver, M.D.     SFG/MEDQ  D:  12/09/2010  T:  12/10/2010  Job:  [id number]  cc:   [facility] Shirley Friar, MD Tammy R. Collins Scotland, M.D.  Electronically Signed by Herbert Moors MD on 12/14/2010 09:17:56 AM

## 2010-12-14 NOTE — Consult Note (Signed)
  NAMEMASYN, FULLAM NO.:  [id number]  MEDICAL RECORD NO.:  [id number]  LOCATION:  3313                         FACILITY:  [facility]  PHYSICIAN:  Graylin Shiver, M.D.   DATE OF BIRTH:  [birth year]  DATE OF CONSULTATION:  12/09/2010 DATE OF DISCHARGE:                                CONSULTATION   REASON FOR CONSULTATION:  The patient is a 65 year old female who presented to the emergency department with some complaints of chest pressure and feeling very weak and dizzy.  She had been noticing black tarry stools for 3 days prior to coming into be checked out.  In the emergency room, she was evaluated and her stool was found to be positive for occult blood and her initial hemoglobin and hematocrit were 5 and 17.3.  She has been transfused 3 units of packed red cells and today her blood count is 8.8 and 27.  She is feeling much better.  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  This was done at [facility].  After that she had a pancreatic stent placed temporarily which was later removed.  PAST MEDICAL HISTORY:  Significant for cirrhosis of the liver, atrial fibrillation, type 2 diabetes mellitus.  PRIOR SURGERIES:  Appendectomy, hysterectomy, cholecystectomy, back surgeries, coronary artery bypass graft in 1974, cardiac ablation in 2011.  ALLERGIES:  RED DYE, ERYTHROMYCIN, IVP DYE, PENICILLIN, LIPITOR.  MEDICATIONS PRIOR TO ADMISSION:  Noted on H and P.  SOCIAL HISTORY:  Does not smoke or drink alcohol.  SYSTEMS REVIEW:  Currently not complaining of chest pain or shortness of breath.  PHYSICAL EXAMINATION:  GENERAL:  She does not appear in any acute distress. EYES:  She is nonicteric. HEART:  Regular rhythm.  No murmurs. LUNGS:  Clear. ABDOMEN:  Bowel sounds normal, soft, nontender, no hepatosplenomegaly.  IMPRESSION: 1. Melena. 2. Anemia.  PLAN:  Proceed  with EGD to evaluate the upper GI tract to look for a source of melena.          ______________________________ Graylin Shiver, M.D.     SFG/MEDQ  D:  12/09/2010  T:  12/10/2010  Job:  [id number]  cc:   [facility] Shirley Friar, MD Tammy R. Collins Scotland, M.D.  Electronically Signed by Herbert Moors MD on 12/14/2010 09:17:54 AM

## 2010-12-18 NOTE — Discharge Summary (Signed)
Natalie Lane, Natalie Lane NO.:  [id number]  MEDICAL RECORD NO.:  [id number]  LOCATION:  3739                         FACILITY:  [facility]  PHYSICIAN:  Rosanna Randy, MDDATE OF BIRTH:  1945-03-03  DATE OF ADMISSION:  12/08/2010 DATE OF DISCHARGE:  12/11/2010                              DISCHARGE SUMMARY   DISCHARGE DIAGNOSES: 1. Acute blood loss anemia with melena status post 3 units of PRBCs     transfused. 2. Diabetes with a hemoglobin A1c of 7.1. 3. History of paroxysmal atrial fibrillation/atrial flutter status     post ablation. 4. History of hypertension. 5. History of nonalcoholic steatohepatitis. 6. History of esophageal varices. 7. History of ampullary polyps status post removal. 8. History of atrial septal defect status post repair in 1978. 9. Obesity. 10.Dry eyes. 11.Allergic rhinitis. 12.Chest pressure, dizzy, and weakness associated to gastrointestinal     bleed.  DISCHARGE MEDICATIONS:1. Ferrous sulfate 325 mg 1 tablet by mouth 3 times a day. 2. Protonix 40 mg 1 tablet by mouth twice a day. 3. Shellia Cleverly moisturizing ophthalmic drops over the counter 1-2 drops in     both eyes as needed for dry eyes. 4. Benadryl 25 mg 1 tablet by mouth daily as needed for allergies. 5. Dramamine 1 tablet by mouth daily as needed for nausea. 6. Fluocinonide topical 0.05% 1 application twice a day. 7. Furosemide 20 mg 1 tablet by mouth daily as needed for fluid     retention. 8. Gas-X over the counter 1 capsule by mouth daily as needed for     gas/bloating. 9. Glucotrol XL 10 mg 1 tablet by mouth daily. 10.Metformin 1000 mg 1 tablet by mouth twice a day. 11.Metoprolol tartrate 50 mg 1 tablet by mouth daily. 12.Tums over the counter 2 tablets by mouth daily as needed for     indigestion.  The patient had been advised to stop using her aspirin until she follows up with primary care physician.  DISPOSITION AND FOLLOWUP:  The patient had been discharged in  stable and improved condition.  Currently, not having any significant melanotic stool or any bleeding.  She is not having any chest pain, any shortness of breath, and is feeling a lot stronger.  No dizziness.  The patient is going to arrange a followup appointment over the next 7-10 days with primary care physician in order to follow her symptoms and also on her hemoglobin levels.  She will follow up with her gastroenterologist, Dr. Bosie Clos and for that, she is going to call the office in order for them to arrange that visit.  The patient has been instructed to follow a low- carbohydrate diet, a low-calorie diet, and also a heart-healthy diet. She will take her medication as prescribed.  At this point, new medications includes ferrous sulfate 325 mg 1 tablet by mouth 3 times a day, and the patient has been now placed on Protonix 40 mg 1 tablet by mouth twice a day.  PROCEDURE PERFORMED DURING THIS HOSPITALIZATION:  On December 08, 2010, she had a chest x-ray that demonstrated borderline cardiomegaly without any failure and there was also a stable prominence of the pulmonary hilar.  The patient had an  EGD and also a colonoscopy, both performed by Dr. Evette Cristal, [facility], please refer to his operative report for details of these procedures, but essentially there were both without significant findings that will explain the patient's bleeding.  There was an esophageal varices at the lower end of the esophagus without any active bleeding or stigmata of prior bleeding.  No other procedures were performed during this hospitalization.  GI was consulted during this admission.  HISTORY OF PRESENT ILLNESS:  For full details, please refer to dictation done by Dr. Gasper Sells on December 08, 2010, but briefly, Natalie Lane is a 65 year old woman who presented to the emergency department because of chest pressure in the setting of having black tarry stools for the last 3 days.  The patient was feeling very  weak and also very dizzy.  In the emergency department, the patient had a positive fecal occult blood test, and she also had a hemoglobin level of 5.6.  She was admitted to the hospital for further evaluation and treatment.  PERTINENT LABORATORY DATA:  Throughout this hospitalization includes cardiac markers negative x3.  Positive fecal occult blood test.  On admission, a CBC with a hemoglobin of 5.0, white blood cells 8.2, platelets 173.  BMET with a sodium of 138, potassium 4.0, chloride 105, bicarb 21, glucose 232, BUN 20, creatinine 0.55.  MRSA PCR screening was negative.  Hemoglobin A1c 7.1.  PT 16.8, INR 1.34.  At discharge, the patient had a comprehensive metabolic panel showing a sodium of 138, potassium 4.3, chloride 105, bicarb 23, glucose 221, BUN 9, creatinine 0.48.  LFTs with a mild elevation on her AST and ALT in the 48 and 46 range respectively, otherwise normal.  CBC with a white blood cell of 6.1, hemoglobin 8.7, platelets 165.  HOSPITAL COURSE BY PROBLEM: 1. GI bleed with melena and acute blood loss anemia, severely     symptomatic with a chest pressure associated.  The patient received     3 units of PRBCs.  Hemoglobin has remained stable over the last 72     hours.  The patient had an EGD and also a colonoscopy that failed     to demonstrate any acute source of bleeding.  At this point,     recommendations is to continue the patient on Protonix twice a day,     start the patient on iron sulfate, and to follow with primary care     physician and also Dr. Bosie Clos as an outpatient.  At discharge,     the patient's hemoglobin was 8.7. 2. Diabetes with a hemoglobin A1c of 7.1.  We are going to continue     her glipizide and also her metformin. 3. History of paroxysmal atrial fibrillation.  We are going to     continue the use of metoprolol.  The patient is status post     ablation and has been in normal sinus rhythm throughout this     hospitalization. 4. History of  NASH with changes compatible with cirrhosis on an MRI of     her abdomen in 2007.  Her quads demonstrating mild elevation of the     PT to 16.8 and an INR of 1.34.  This is something that needs to be     followed as an outpatient closely but at this moment, no further     workup will be required. 5. Hypertension which is stable.  We are going to continue using her     chronic medications. 6. History  of dry eyes.  We are going to continue using Bosch     moisturizer and ophthalmologic drops. 7. History of seasonal allergies.  We are going to continue using     Benadryl. 8. Chest pressure, weakness, and dizziness, all associated to problem     #1 and her acute blood loss anemia.  At the moment of discharge,     her symptoms were completely resolved.  Hemoglobin stable and     throughout this hospitalization, no abnormalities seen on EKG or     telemetry, and she had negative cardiac enzymes x3.  PHYSICAL EXAMINATION:  VITAL SIGNS:  At discharge, temperature 98.2, heart rate 72, respiratory rate 18, blood pressure 103/57, oxygen saturation 96% on room air. GENERAL:  No acute distress. RESPIRATORY SYSTEM:  Clear to auscultation. HEART:  With S1 and S2.  Sinus rhythm. ABDOMEN:  Positive bowel sounds.  Soft, nontender.  Tolerating p.o.'s without any problems. EXTREMITIES:  No edema. NEUROLOGIC:  Nonfocal.     Rosanna Randy, MD     CEM/MEDQ  D:  12/11/2010  T:  12/11/2010  Job:  [id number]  cc:   Tammy R. Collins Scotland, M.D.  Electronically Signed by Vassie Loll MD on 12/18/2010 10:36:10 PM

## 2010-12-22 SURGERY — EGD (ESOPHAGOGASTRODUODENOSCOPY)
Anesthesia: Moderate Sedation

## 2010-12-23 ENCOUNTER — Other Ambulatory Visit: Payer: Self-pay | Admitting: *Deleted

## 2010-12-23 MED ORDER — METOPROLOL TARTRATE 50 MG PO TABS: ORAL_TABLET | ORAL | Status: DC

## 2010-12-31 ENCOUNTER — Ambulatory Visit: Payer: 59 | Admitting: Nurse Practitioner

## 2011-08-31 ENCOUNTER — Observation Stay (HOSPITAL_COMMUNITY)
Admission: EM | Admit: 2011-08-31 | Discharge: 2011-09-02 | DRG: 310 | Disposition: A | Discharge status: Home / Self Care | Payer: 59 | Attending: Internal Medicine | Admitting: Internal Medicine

## 2011-08-31 ENCOUNTER — Encounter (HOSPITAL_COMMUNITY): Payer: Self-pay | Admitting: Emergency Medicine

## 2011-08-31 ENCOUNTER — Emergency Department (HOSPITAL_COMMUNITY): Payer: 59

## 2011-08-31 DIAGNOSIS — I059 Rheumatic mitral valve disease, unspecified: Secondary | ICD-10-CM | POA: Insufficient documentation

## 2011-08-31 DIAGNOSIS — E1165 Type 2 diabetes mellitus with hyperglycemia: Secondary | ICD-10-CM | POA: Diagnosis present

## 2011-08-31 DIAGNOSIS — E785 Hyperlipidemia, unspecified: Secondary | ICD-10-CM | POA: Insufficient documentation

## 2011-08-31 DIAGNOSIS — E1142 Type 2 diabetes mellitus with diabetic polyneuropathy: Secondary | ICD-10-CM | POA: Diagnosis present

## 2011-08-31 DIAGNOSIS — M542 Cervicalgia: Secondary | ICD-10-CM | POA: Insufficient documentation

## 2011-08-31 DIAGNOSIS — Q249 Congenital malformation of heart, unspecified: Secondary | ICD-10-CM

## 2011-08-31 DIAGNOSIS — E119 Type 2 diabetes mellitus without complications: Secondary | ICD-10-CM | POA: Insufficient documentation

## 2011-08-31 DIAGNOSIS — I4892 Unspecified atrial flutter: Principal | ICD-10-CM | POA: Diagnosis present

## 2011-08-31 DIAGNOSIS — M79609 Pain in unspecified limb: Secondary | ICD-10-CM | POA: Insufficient documentation

## 2011-08-31 DIAGNOSIS — R079 Chest pain, unspecified: Secondary | ICD-10-CM | POA: Diagnosis present

## 2011-08-31 DIAGNOSIS — K7460 Unspecified cirrhosis of liver: Secondary | ICD-10-CM | POA: Insufficient documentation

## 2011-08-31 DIAGNOSIS — R0602 Shortness of breath: Secondary | ICD-10-CM | POA: Insufficient documentation

## 2011-08-31 LAB — CBC
MCH: 32 pg (ref 26.0–34.0)
MCHC: 33.8 g/dL (ref 30.0–36.0)
MCV: 94.7 fL (ref 78.0–100.0)
Platelets: 128 10*3/uL — ABNORMAL LOW (ref 150–400)
RDW: 14.7 % (ref 11.5–15.5)

## 2011-08-31 LAB — BASIC METABOLIC PANEL
BUN: 19 mg/dL (ref 6–23)
CO2: 23 mEq/L (ref 19–32)
Calcium: 9.4 mg/dL (ref 8.4–10.5)
Chloride: 100 mEq/L (ref 96–112)
Creatinine, Ser: 0.49 mg/dL — ABNORMAL LOW (ref 0.50–1.10)
Glucose, Bld: 148 mg/dL — ABNORMAL HIGH (ref 70–99)

## 2011-08-31 MED ORDER — ENOXAPARIN SODIUM 40 MG/0.4ML ~~LOC~~ SOLN
40.0000 mg | SUBCUTANEOUS | Status: DC
  Administered 2011-09-01 – 2011-09-02 (×2): 40 mg via SUBCUTANEOUS
  Filled 2011-08-31 (×2): qty 0.4

## 2011-08-31 MED ORDER — SODIUM CHLORIDE 0.9 % IJ SOLN: 3.0000 mL | Freq: Two times a day (BID) | INTRAMUSCULAR | Status: DC

## 2011-08-31 MED ORDER — ACETAMINOPHEN 650 MG RE SUPP: 650.0000 mg | Freq: Four times a day (QID) | RECTAL | Status: DC | PRN

## 2011-08-31 MED ORDER — ONDANSETRON HCL 4 MG/2ML IJ SOLN: 4.0000 mg | Freq: Four times a day (QID) | INTRAMUSCULAR | Status: DC | PRN

## 2011-08-31 MED ORDER — GLIPIZIDE 10 MG PO TABS
10.0000 mg | ORAL_TABLET | Freq: Every day | ORAL | Status: DC
  Administered 2011-09-01 – 2011-09-02 (×2): 10 mg via ORAL
  Filled 2011-08-31 (×2): qty 1

## 2011-08-31 MED ORDER — PANTOPRAZOLE SODIUM 20 MG PO TBEC
20.0000 mg | DELAYED_RELEASE_TABLET | Freq: Two times a day (BID) | ORAL | Status: DC
  Administered 2011-09-01 – 2011-09-02 (×4): 20 mg via ORAL
  Filled 2011-08-31 (×6): qty 1

## 2011-08-31 MED ORDER — ONDANSETRON HCL 4 MG PO TABS: 4.0000 mg | ORAL_TABLET | Freq: Four times a day (QID) | ORAL | Status: DC | PRN

## 2011-08-31 MED ORDER — METOPROLOL TARTRATE 25 MG PO TABS
25.0000 mg | ORAL_TABLET | Freq: Every day | ORAL | Status: DC
  Administered 2011-09-01: 25 mg via ORAL
  Filled 2011-08-31 (×2): qty 1

## 2011-08-31 MED ORDER — NITROGLYCERIN 0.4 MG SL SUBL: 0.4000 mg | SUBLINGUAL_TABLET | SUBLINGUAL | Status: DC | PRN

## 2011-08-31 MED ORDER — METOPROLOL TARTRATE 50 MG PO TABS
50.0000 mg | ORAL_TABLET | Freq: Every morning | ORAL | Status: DC
  Administered 2011-09-01 – 2011-09-02 (×2): 50 mg via ORAL
  Filled 2011-08-31 (×2): qty 1

## 2011-08-31 MED ORDER — HYDROXYZINE HCL 25 MG PO TABS
25.0000 mg | ORAL_TABLET | Freq: Every day | ORAL | Status: DC
  Administered 2011-09-01: 25 mg via ORAL
  Filled 2011-08-31 (×2): qty 1

## 2011-08-31 MED ORDER — SODIUM CHLORIDE 0.9 % IJ SOLN
3.0000 mL | Freq: Two times a day (BID) | INTRAMUSCULAR | Status: DC
  Administered 2011-09-01 (×3): 3 mL via INTRAVENOUS

## 2011-08-31 MED ORDER — ACETAMINOPHEN 325 MG PO TABS: 650.0000 mg | ORAL_TABLET | Freq: Four times a day (QID) | ORAL | Status: DC | PRN

## 2011-08-31 NOTE — ED Notes (Signed)
Report given to [facility]. Pt to transport to CDU. No CP or respiratory issues at this time. Pt transported by RN on the monitor. Will continue to monitor.

## 2011-08-31 NOTE — ED Notes (Signed)
Pt has been having left sided CP that radiates down her left arm. She also has been having increased weakness since this AM. She is also having generalized abdominal pain. No n/v/d. No SOB or diaphoresis. Currently pt stated that she is not having and CP. Will continue to monitor.

## 2011-08-31 NOTE — ED Provider Notes (Signed)
History     CSN: [id number]  Arrival date & time 08/31/11  1539   First MD Initiated Contact with Patient 08/31/11 1701      Chief Complaint  Patient presents with  . Chest Pain    (Consider location/radiation/quality/duration/timing/severity/associated sxs/prior treatment) HPI Patient presents emergency department with chest pain, that radiated to her neck and left arm that began earlier today.  Patient, states her last 2 days she has had shortness of breath.  Patient denies, nausea, vomiting, diarrhea, abdominal pain dizziness syncope, fever, or cough.  Patient, states nothing seems to make her shortness of breath, worse.  Patient, states she has had some mild headache with diarrhea.  Patient, states that her chest pain is resolved and she is still having arm pain.  Past Medical History  Diagnosis Date  . Atrial flutter   . 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   . Edema   . Diabetes mellitus, type 2   . Hyperlipidemia   . Anemia   . Normal stress echocardiogram 2008  . Cirrhosis     Past Surgical History  Procedure Date  . Asd versus vsd repair 1978    Operative notes not available.  . Atrial ablation surgery 10 of 2011  . Other surgical history     partial hysterectomy  . Cholecystectomy   . Back surgery 1986  . Hernia repair   . Abdominal hysterectomy     Family History  Problem Relation Age of Onset  . Kidney disease Sister   . Depression Paternal Grandfather   . Diabetes Brother   . Hypertension Mother     History  Substance Use Topics  . Smoking status: Former Smoker -- 1.0 packs/day for 36 years    Types: Cigarettes    Quit date: 06/25/2007  . Smokeless tobacco: Never Used  . Alcohol Use: No    OB History    Grav Para Term Preterm Abortions TAB SAB Ect Mult Living                  Review of Systems All other systems negative except as documented in the HPI. All pertinent  positives and negatives as reviewed in the HPI.  Allergies  Other; Red dye; Erythromycin; Penicillins; and Sulfa antibiotics  Home Medications   Current Outpatient Rx  Name Route Sig Dispense Refill  . VITAMIN D PO Oral Take 1 tablet by mouth daily.    Marland Kitchen GLIPIZIDE 10 MG PO TABS Oral Take 10 mg by mouth daily.     Marland Kitchen HYDROXYZINE HCL 25 MG PO TABS Oral Take 25 mg by mouth at bedtime.    Marland Kitchen METFORMIN HCL 1000 MG PO TABS Oral Take 1,000 mg by mouth 2 (two) times daily with a meal.      . METOPROLOL TARTRATE 50 MG PO TABS  Take 50 mg in the morning and 25 mg in the evening. 45 tablet 5  . ZINC PO Oral Take 1 tablet by mouth daily.    Marland Kitchen OVER THE COUNTER MEDICATION Oral Take 1 tablet by mouth daily. Vitamin C/Zinc    . OVER THE COUNTER MEDICATION Both Eyes Place 2 drops into both eyes every morning. OTC allergy eye drops    . PANTOPRAZOLE SODIUM 20 MG PO TBEC Oral Take 20 mg by mouth 2 (two) times daily.      BP 122/56  Pulse 90  Temp 98.6 F (37 C) (Oral)  Resp 23  Ht 5\' 3"  (1.6 m)  Wt 170 lb (77.111 kg)  BMI 30.11 kg/m2  SpO2 96%  Physical Exam  Nursing note and vitals reviewed. Constitutional: She is oriented to person, place, and time. She appears well-developed and well-nourished.  HENT:  Head: Normocephalic and atraumatic.  Eyes: Pupils are equal, round, and reactive to light.  Neck: Normal range of motion. Neck supple.  Cardiovascular: Normal rate, regular rhythm and normal heart sounds.  Exam reveals no gallop and no friction rub.   No murmur heard. Pulmonary/Chest: Effort normal and breath sounds normal. No respiratory distress.  Abdominal: Soft. Bowel sounds are normal. There is no rigidity, no rebound and no guarding.    Neurological: She is alert and oriented to person, place, and time.  Skin: Skin is warm and dry. No rash noted.    ED Course  Procedures (including critical care time)  Labs Reviewed  CBC - Abnormal; Notable for the following:    Platelets 128  (*)     All other components within normal limits  BASIC METABOLIC PANEL - Abnormal; Notable for the following:    Glucose, Bld 148 (*)     Creatinine, Ser 0.49 (*)     All other components within normal limits  POCT I-STAT TROPONIN I   Dg Chest 2 View  08/31/2011  *RADIOLOGY REPORT*  Clinical Data: Chest pain.  Left arm pain.  Neck pain.  CHEST - 2 VIEW  Comparison: 12/08/2010 and 12/05/2008  Findings: The heart size and pulmonary vascularity are normal and the lungs are clear.  No acute osseous abnormality.  Evidence of prior median sternotomy.  IMPRESSION: No acute disease.  Original Report Authenticated By: Gwynn Burly, M.D.    The patient will be admitted to the hospital for further evaluation of her chest pain. The patient is given the plan and voiced an understanding. The patient questions were answered.     MDM  MDM Reviewed: nursing note and vitals Interpretation: labs, ECG and x-ray Consults: admitting MD    Date: 08/31/2011  Rate: 97  Rhythm: normal sinus rhythm  QRS Axis: normal  Intervals: normal  ST/T Wave abnormalities: nonspecific T wave changes  Conduction Disutrbances:none  Narrative Interpretation:   Old EKG Reviewed: unchanged            Carlyle Dolly, PA-C 08/31/11 2236

## 2011-08-31 NOTE — ED Notes (Signed)
Complaining of pain in left arm with radiation to jaw, heaviness in chest. Started today at work became progressively worse. States past weekend nausea, abdominal pain and diarrhea. Related today symptoms with those. Also had SOB. States she called PCP and referred to ED

## 2011-08-31 NOTE — ED Notes (Signed)
Pt c/o of heaviness in her chest and pain in her neck that radiates down her left arm.Pt c/o headache and diarrhea.

## 2011-08-31 NOTE — ED Notes (Signed)
Ambulatory to bathroom without difficulty.   

## 2011-09-01 ENCOUNTER — Inpatient Hospital Stay (HOSPITAL_COMMUNITY): Payer: 59

## 2011-09-01 DIAGNOSIS — I519 Heart disease, unspecified: Secondary | ICD-10-CM

## 2011-09-01 LAB — COMPREHENSIVE METABOLIC PANEL
CO2: 25 mEq/L (ref 19–32)
Calcium: 9 mg/dL (ref 8.4–10.5)
Creatinine, Ser: 0.49 mg/dL — ABNORMAL LOW (ref 0.50–1.10)
GFR calc Af Amer: >90 mL/min (ref >90)
GFR calc non Af Amer: >90 mL/min (ref >90)
Glucose, Bld: 214 mg/dL — ABNORMAL HIGH (ref 70–99)

## 2011-09-01 LAB — GLUCOSE, CAPILLARY
Glucose-Capillary: 198 mg/dL — ABNORMAL HIGH (ref 70–99)
Glucose-Capillary: 398 mg/dL — ABNORMAL HIGH (ref 70–99)

## 2011-09-01 LAB — CARDIAC PANEL(CRET KIN+CKTOT+MB+TROPI)
CK, MB: 1.4 ng/mL (ref 0.3–4.0)
CK, MB: 1.4 ng/mL (ref 0.3–4.0)
CK, MB: 1.3 ng/mL (ref 0.3–4.0)
Relative Index: INVALID (ref 0.0–2.5)
Total CK: 47 U/L (ref 7–177)
Total CK: 45 U/L (ref 7–177)
Troponin I: <0.3 ng/mL (ref <0.30)
Troponin I: <0.3 ng/mL (ref <0.30)

## 2011-09-01 LAB — HEPATIC FUNCTION PANEL
AST: 21 U/L (ref 0–37)
Albumin: 3.4 g/dL — ABNORMAL LOW (ref 3.5–5.2)
Bilirubin, Direct: 0.2 mg/dL (ref 0.0–0.3)

## 2011-09-01 LAB — CBC
HCT: 38.3 % (ref 36.0–46.0)
HCT: 38.7 % (ref 36.0–46.0)
Hemoglobin: 12.9 g/dL (ref 12.0–15.0)
MCHC: 33.3 g/dL (ref 30.0–36.0)
MCV: 95.3 fL (ref 78.0–100.0)
Platelets: 131 10*3/uL — ABNORMAL LOW (ref 150–400)
RBC: 4.02 MIL/uL (ref 3.87–5.11)
RBC: 4.05 MIL/uL (ref 3.87–5.11)
WBC: 5.9 10*3/uL (ref 4.0–10.5)

## 2011-09-01 LAB — CREATININE, SERUM: GFR calc non Af Amer: >90 mL/min (ref >90)

## 2011-09-01 LAB — LIPASE, BLOOD: Lipase: 35 U/L (ref 11–59)

## 2011-09-01 MED ORDER — INSULIN ASPART 100 UNIT/ML ~~LOC~~ SOLN
0.0000 [IU] | Freq: Three times a day (TID) | SUBCUTANEOUS | Status: DC
  Administered 2011-09-01: 3 [IU] via SUBCUTANEOUS
  Administered 2011-09-02: 5 [IU] via SUBCUTANEOUS
  Administered 2011-09-02: 8 [IU] via SUBCUTANEOUS

## 2011-09-01 MED ORDER — INSULIN ASPART 100 UNIT/ML ~~LOC~~ SOLN
0.0000 [IU] | Freq: Every day | SUBCUTANEOUS | Status: DC
  Administered 2011-09-01: 2 [IU] via SUBCUTANEOUS

## 2011-09-01 MED ORDER — PREDNISONE 50 MG PO TABS
50.0000 mg | ORAL_TABLET | Freq: Once | ORAL | Status: AC
  Administered 2011-09-01: 50 mg via ORAL
  Filled 2011-09-01: qty 1

## 2011-09-01 MED ORDER — DIPHENHYDRAMINE HCL 50 MG/ML IJ SOLN
50.0000 mg | Freq: Once | INTRAMUSCULAR | Status: DC
  Filled 2011-09-01: qty 1

## 2011-09-01 MED ORDER — DIPHENHYDRAMINE HCL 50 MG PO CAPS: 50.0000 mg | ORAL_CAPSULE | Freq: Once | ORAL | Status: DC

## 2011-09-01 MED ORDER — DIPHENHYDRAMINE HCL 50 MG PO CAPS
50.0000 mg | ORAL_CAPSULE | Freq: Once | ORAL | Status: DC
  Filled 2011-09-01: qty 1

## 2011-09-01 MED ORDER — DIPHENHYDRAMINE HCL 50 MG/ML IJ SOLN
50.0000 mg | Freq: Once | INTRAMUSCULAR | Status: AC
  Administered 2011-09-01: 50 mg via INTRAVENOUS

## 2011-09-01 NOTE — H&P (Signed)
Natalie Lane is an 66 y.o. female.   PCP - Dr.Tammy Spears. Chief Complaint: Chest pain. 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 The whole incident lasted for one hour after which chest pressure got relieved but her left arm discomfort still persist. Patient drove to the ER. Cardiac enzymes and chest x-ray and EKG does not show anything acute and has been admitted for further management. Of recent last few days patient has been experiencing exertional shortness of breath but denies any cough or phlegm or fever chills. Denies any nausea vomiting or abdominal pain or diarrhea.   Past Medical History  Diagnosis Date  . Atrial flutter   . 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   . Edema   . Diabetes mellitus, type 2   . Hyperlipidemia   . Anemia   . Normal stress echocardiogram 2008  . Cirrhosis     Past Surgical History  Procedure Date  . Asd versus vsd repair 1978    Operative notes not available.  . Atrial ablation surgery 10 of 2011  . Other surgical history     partial hysterectomy  . Cholecystectomy   . Back surgery 1986  . Hernia repair   . Abdominal hysterectomy     Family History  Problem Relation Age of Onset  . Kidney disease Sister   . Depression Paternal Grandfather   . Diabetes Brother   . Hypertension Mother    Social History:  reports that she quit smoking about 4 years ago. Her smoking use included Cigarettes. She has a 36 pack-year smoking history. She has never used smokeless tobacco. She reports that she does not drink alcohol or use illicit drugs.  Allergies:  Allergies  Allergen Reactions  . Other Anaphylaxis    MSG  . Red Dye  Anaphylaxis  . Erythromycin Other (See Comments)    migraine  . Penicillins Other (See Comments)    headaches  . Sulfa Antibiotics Hives    Medications Prior to Admission  Medication Sig Dispense Refill  . Cholecalciferol (VITAMIN D PO) Take 1 tablet by mouth daily.      Marland Kitchen glipiZIDE (GLUCOTROL) 10 MG tablet Take 10 mg by mouth daily.       . hydrOXYzine (ATARAX/VISTARIL) 25 MG tablet Take 25 mg by mouth at bedtime.      . metFORMIN (GLUCOPHAGE) 1000 MG tablet Take 1,000 mg by mouth 2 (two) times daily with a meal.        . metoprolol (LOPRESSOR) 50 MG tablet Take 50 mg in the morning and 25 mg in the evening.  45 tablet  5  . Multiple Vitamins-Minerals (ZINC PO) Take 1 tablet by mouth daily.      Marland Kitchen OVER THE COUNTER MEDICATION Take 1 tablet by mouth daily. Vitamin C/Zinc      . OVER THE COUNTER MEDICATION Place 2 drops into both eyes every morning. OTC allergy eye drops      . pantoprazole (PROTONIX) 20 MG tablet Take 20 mg by mouth 2 (two) times daily.        Results for orders placed during the hospital encounter of 08/31/11 (from the past 48 hour(s))  CBC     Status:  Abnormal   Collection Time   08/31/11  5:14 PM      Component Value Range Comment   WBC 7.3  4.0 - 10.5 K/uL    RBC 4.16  3.87 - 5.11 MIL/uL    Hemoglobin 13.3  12.0 - 15.0 g/dL    HCT 11.9  14.7 - 82.9 %    MCV 94.7  78.0 - 100.0 fL    MCH 32.0  26.0 - 34.0 pg    MCHC 33.8  30.0 - 36.0 g/dL    RDW 56.2  13.0 - 86.5 %    Platelets 128 (*) 150 - 400 K/uL   BASIC METABOLIC PANEL     Status: Abnormal   Collection Time   08/31/11  5:14 PM      Component Value Range Comment   Sodium 137  135 - 145 mEq/L    Potassium 4.1  3.5 - 5.1 mEq/L    Chloride 100  96 - 112 mEq/L    CO2 23  19 - 32 mEq/L    Glucose, Bld 148 (*) 70 - 99 mg/dL    BUN 19  6 - 23 mg/dL    Creatinine, Ser 7.84 (*) 0.50 - 1.10 mg/dL    Calcium 9.4  8.4 - 69.6 mg/dL    GFR calc non Af Amer >90  >90 mL/min    GFR calc Af Amer >90  >90 mL/min     POCT I-STAT TROPONIN I     Status: Normal   Collection Time   08/31/11  5:29 PM      Component Value Range Comment   Troponin i, poc 0.00  0.00 - 0.08 ng/mL    Comment 3            GLUCOSE, CAPILLARY     Status: Abnormal   Collection Time   08/31/11 11:56 PM      Component Value Range Comment   Glucose-Capillary 210 (*) 70 - 99 mg/dL    Dg Chest 2 View  2/95/2841  *RADIOLOGY REPORT*  Clinical Data: Chest pain.  Left arm pain.  Neck pain.  CHEST - 2 VIEW  Comparison: 12/08/2010 and 12/05/2008  Findings: The heart size and pulmonary vascularity are normal and the lungs are clear.  No acute osseous abnormality.  Evidence of prior median sternotomy.  IMPRESSION: No acute disease.  Original Report Authenticated By: Gwynn Burly, M.D.    Review of Systems  Constitutional: Negative.   HENT: Negative.   Eyes: Negative.   Respiratory: Positive for shortness of breath.   Cardiovascular: Positive for chest pain.  Genitourinary: Negative.   Musculoskeletal:       Left arm and neck pain.  Skin: Negative.   Neurological: Negative.   Psychiatric/Behavioral: Negative.     Blood pressure 125/76, pulse 87, temperature 98.2 F (36.8 C), temperature source Oral, resp. rate 21, height 5\' 3"  (1.6 m), weight 79.1 kg (174 lb 6.1 oz), SpO2 95.00%. Physical Exam  Constitutional: She is oriented to person, place, and time. She appears well-developed and well-nourished. No distress.  HENT:  Head: Normocephalic and atraumatic.  Right Ear: External ear normal.  Left Ear: External ear normal.  Nose: Nose normal.  Mouth/Throat: Oropharynx is clear and moist. No oropharyngeal exudate.  Eyes: Conjunctivae are normal. Pupils are equal, round, and reactive to light. Right eye exhibits no discharge. Left eye exhibits no discharge. No scleral icterus.  Neck: Normal range of motion.  Cardiovascular: Normal rate and regular rhythm.   Respiratory:  Effort normal and breath sounds normal. No respiratory  distress. She has no wheezes. She has no rales.  GI: Soft. Bowel sounds are normal. She exhibits no distension. There is no tenderness.  Musculoskeletal: Normal range of motion. She exhibits no edema and no tenderness.  Neurological: She is alert and oriented to person, place, and time.       Moves all extremities.  Skin: Skin is warm and dry. She is not diaphoretic.  Psychiatric: Her behavior is normal.     Assessment/Plan #1. Chest pain to rule out ACS  - we'll cycle cardiac markers and place patient on aspirin. Patient's EKG shows low-voltage and also has exertional shortness of breath for which we will check 2-D echo and also check a d-dimer level. If d-dimer levels are high we'll get a CT angiogram of the chest. Patient still has persistent neck pain and left arm pain for which I have ordered C-spine x-ray and left upper extremity Doppler. #2. History of atrial flutter status post ablation presenting in controlled rate - we'll continue her Toprol home dose. #3. History of congenital heart disease (ASD versus VSD) status post closure. #4. History of diabetes mellitus2 - continue Glucotrol with sliding scale coverage. We will hold metformin for now. #5. 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 #6. History of ampullary polyp status post removal.  CODE STATUS - full code.   Prajwal Fellner N. 09/01/2011, 12:18 AM

## 2011-09-01 NOTE — Care Management Note (Unsigned)
    Page 1 of 1   09/01/2011     11:48:40 AM   CARE MANAGEMENT NOTE 09/01/2011  Patient:  Natalie Lane, Natalie Lane   Account Number:  [id number]  Date Initiated:  09/01/2011  Documentation initiated by:  SIMMONS,Bodin Gorka  Subjective/Objective Assessment:   ADMITTED WITH CHEST PAIN; LIVES AT HOME ALONE; USES [facility] ON [address] FOR MEDS; VERBALIZED THAT SHE NEEDED A CANE FOR D/C.     Action/Plan:   DISCHARGE PLANNING DISCUSSED AT BEDSIDE.   Anticipated DC Date:  09/02/2011   Anticipated DC Plan:  HOME/SELF CARE      DC Planning Services  CM consult      Choice offered to / List presented to:             Status of service:  In process, will continue to follow Medicare Important Message given?   (If response is "NO", the following Medicare IM given date fields will be blank) Date Medicare IM given:   Date Additional Medicare IM given:    Discharge Disposition:    Per UR Regulation:  Reviewed for med. necessity/level of care/duration of stay  If discussed at Long Length of Stay Meetings, dates discussed:    Comments:  09/01/11  1144  Anelia Carriveau SIMMONS RN, BSN [phone] MD-   PLEASE ORDER CANE FOR PT, THANKS.   NCM WILL FOLLOW.

## 2011-09-01 NOTE — Progress Notes (Signed)
PCP: Herb Grays, MD Primary Cardiologist: Dr. Peter Swaziland  Brief HPI:  66 year-old female with history of atrial flutter status post ablation, diabetes mellitus2, congenital heart disease (ASD versus VSD) status post closure, cirrhosis of liver secondary to [place]. Started having chest pressure afternoon of admission while at work with radiation to left arm and neck. The whole incident lasted for one hour after which chest pressure got relieved but her left arm discomfort still persist. Patient drove to the ER. Cardiac enzymes and chest x-ray and EKG does not show anything acute and was admitted for further management. Of recent last few days patient has been experiencing exertional shortness of breath but denies any cough or phlegm or fever chills. Denies any nausea vomiting or abdominal pain or diarrhea.   Past medical history:  Past Medical History  Diagnosis Date  . Atrial flutter   . 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   . Edema   . Diabetes mellitus, type 2   . Hyperlipidemia   . Anemia   . Normal stress echocardiogram 2008  . Cirrhosis     Consultants: None so far  Procedures: None  Subjective: Patient says pain is better in the chest. Persists in the arm. Also reports occasional sharp pains in the chest as well. Denies any SOB or nausea.  Objective: Vital signs in last 24 hours: Temp:  [98.2 F (36.8 C)-98.6 F (37 C)] 98.2 F (36.8 C) (07/16 2347) Pulse Rate:  [84-99] 87  (07/16 2347) Resp:  [15-23] 21  (07/16 2347) BP: (106-125)/(51-76) 125/76 mmHg (07/16 2347) SpO2:  [91 %-97 %] 95 % (07/16 2347) Weight:  [77.111 kg (170 lb)-79.1 kg (174 lb 6.1 oz)] 79.1 kg (174 lb 6.1 oz) (07/16 2347) Weight change:  Last BM Date: 08/31/11  Intake/Output from previous day:   Intake/Output this shift: Total I/O In: 240 [P.O.:240] Out: -   General appearance: alert, cooperative, appears stated age and no  distress Head: Normocephalic, without obvious abnormality, atraumatic Resp: clear to auscultation bilaterally Cardio: regular rate and rhythm, S1, S2 normal. Systolic murmur at apex. No click, rub or gallop GI: soft, non-tender; bowel sounds normal; no masses,  no organomegaly Extremities: extremities normal, atraumatic, no cyanosis or edema Pulses: 2+ and symmetric Neurologic: Alert and oriented X 3, normal strength and tone. Normal symmetric reflexes. Normal coordination and gait  Lab Results:  Basename 09/01/11 0540 09/01/11 0015  WBC 5.9 5.7  HGB 12.7 12.9  HCT 38.3 38.7  PLT 131* 126*   BMET  Basename 09/01/11 0540 09/01/11 0015 08/31/11 1714  NA 141 -- 137  K 4.3 -- 4.1  CL 102 -- 100  CO2 25 -- 23  GLUCOSE 214* -- 148*  BUN 13 -- 19  CREATININE 0.49* 0.52 --  CALCIUM 9.0 -- 9.4  ALT 23 23 --    Studies/Results: Dg Chest 2 View  08/31/2011  *RADIOLOGY REPORT*  Clinical Data: Chest pain.  Left arm pain.  Neck pain.  CHEST - 2 VIEW  Comparison: 12/08/2010 and 12/05/2008  Findings: The heart size and pulmonary vascularity are normal and the lungs are clear.  No acute osseous abnormality.  Evidence of prior median sternotomy.  IMPRESSION: No acute disease.  Original Report Authenticated By: Gwynn Burly, M.D.   Dg Cervical Spine Complete  09/01/2011  *RADIOLOGY REPORT*  Clinical Data: Left neck and shoulder pain  CERVICAL SPINE - 4+ VIEWS  Comparison:  None.  Findings:  There is no evidence of cervical spine fracture or prevertebral soft tissue swelling.  Alignment is normal.  No other significant bone abnormalities are identified.  IMPRESSION: Negative cervical spine radiographs.  Original Report Authenticated By: Harrel Lemon, M.D.    Medications:  Scheduled:   . diphenhydrAMINE  50 mg Oral Once  . enoxaparin (LOVENOX) injection  40 mg Subcutaneous Q24H  . glipiZIDE  10 mg Oral Daily  . hydrOXYzine  25 mg Oral QHS  . metoprolol  50 mg Oral q morning - 10a    . metoprolol tartrate  25 mg Oral QHS  . pantoprazole  20 mg Oral BID  . predniSONE  50 mg Oral Once   Followed by  . predniSONE  50 mg Oral Once   Followed by  . predniSONE  50 mg Oral Once  . sodium chloride  3 mL Intravenous Q12H  . sodium chloride  3 mL Intravenous Q12H   Continuous:  YNW:[id number], acetaminophen, nitroGLYCERIN, ondansetron (ZOFRAN) IV, ondansetron  Assessment/Plan:  Principal Problem:  *Chest pain Active Problems:  Atrial flutter  CHD (congenital heart disease)  Diabetes mellitus, type 2    Chest Pain Etiology remains uncertain. She is ruling out for ACS. EKG is similar to previous. D-dimer is elevated. Will proceed with CTA Chest. Patient reports allergy to contrast dye. She said she has a rash but cannot recall anaphylactic reaction. She did have a CT done in 2007 with contrast. Cannot recall the date of her reaction. Will proceed with premedication with Prednisone. If all tests are negative she will need follow up with her cardiologist.  History of Atrial Flutter Stable. Continue metoprolol  DM2 Start SSI. On OHA at home  Code Status [facility] code  DVT Prophylaxis Enoxaparin    LOS: 1 day   [facility]  [facility] Pager [phone] 09/01/2011, 10:22 AM

## 2011-09-02 ENCOUNTER — Encounter (HOSPITAL_COMMUNITY): Payer: Self-pay | Admitting: Radiology

## 2011-09-02 ENCOUNTER — Inpatient Hospital Stay (HOSPITAL_COMMUNITY): Payer: 59

## 2011-09-02 LAB — GLUCOSE, CAPILLARY
Glucose-Capillary: 233 mg/dL — ABNORMAL HIGH (ref 70–99)
Glucose-Capillary: 262 mg/dL — ABNORMAL HIGH (ref 70–99)

## 2011-09-02 MED ORDER — METFORMIN HCL 1000 MG PO TABS: 1000.0000 mg | ORAL_TABLET | Freq: Two times a day (BID) | ORAL | Status: DC

## 2011-09-02 MED ORDER — ASPIRIN EC 81 MG PO TBEC: 81.0000 mg | DELAYED_RELEASE_TABLET | Freq: Every day | ORAL | Status: AC

## 2011-09-02 MED ORDER — IOHEXOL 350 MG/ML SOLN
100.0000 mL | Freq: Once | INTRAVENOUS | Status: AC | PRN
  Administered 2011-09-02: 100 mL via INTRAVENOUS

## 2011-09-02 NOTE — Discharge Summary (Signed)
Physician Discharge Summary  Patient ID: Natalie Lane MRN: [id number] DOB/AGE: [birth year] 66 y.o.  Admit date: 08/31/2011 Discharge date: 09/02/2011  PCP: Herb Grays, MD  DISCHARGE DIAGNOSES:  Active Problems:  Atrial flutter  CHD (congenital heart disease)  Diabetes mellitus, type 2   DISCHARGE CONDITION: fair  Recommendations to PCP Consider referral to GI for Liver Cirrhosis  INITIAL HISTORY: 66 year-old female with history of atrial flutter status post ablation, diabetes mellitus2, congenital heart disease (ASD versus VSD) status post closure, cirrhosis of liver secondary to [place]. Started having chest pressure afternoon of admission while at work with radiation to left arm and neck. The whole incident lasted for one hour after which chest pressure got relieved but her left arm discomfort still persist. Patient drove to the ER. Cardiac enzymes and chest x-ray and EKG does not show anything acute and was admitted for further management. Of recent last few days patient has been experiencing exertional shortness of breath but denies any cough or phlegm or fever chills. Denies any nausea vomiting or abdominal pain or diarrhea.    HOSPITAL COURSE:   Chest Pain  Resolved. Etiology remains uncertain. She ruled out for ACS. CT was negative for PE. Patient's left arm pain appears to be musculoskeletal. EKG is similar to previous. She will need to follow up with her cardiologist for further management of this chest pain. ECHO was also done which revealed no wall motion abnormalities. It did suggest diastolic dysfunction. See report below.  History of Atrial Flutter  This remained stable. Continue metoprolol.  DM2  This was stable. She may resume Metformin on 7/20 (Due to IV contrast).   CTA did suggest liver cirrhosis. Patient is aware of this. Will discuss with her PCP.  Patient is chest pain free today. She is keen on going home. Does mention a a lot of stressors at home and  work. Can return to work on July 22.     2D ECHOCARDIOGRAM 09/01/11 Study Conclusions  - Left ventricle: The cavity size was normal. Wall thickness was normal. Systolic function was normal. The estimated ejection fraction was in the range of 60% to 65%. Wall motion was normal; there were no regional wall motion abnormalities. Features are consistent with a pseudonormal left ventricular filling pattern, with concomitant abnormal relaxation and increased filling pressure (grade 2 diastolic dysfunction). - Aortic valve: There was no stenosis. - Mitral valve: Mildly calcified annulus. Mildly calcified leaflets . Trivial regurgitation. - Left atrium: The atrium was mildly dilated. - Right ventricle: The cavity size was normal. Systolic function was normal. - Right atrium: The atrium was mildly dilated. - Tricuspid valve: Peak RV-RA gradient: 27mm Hg (S). - Pulmonary arteries: PA systolic pressure 33-37 mmHg. - Systemic veins: IVC measured 2.2 cm with normal respirophasic variation, suggesting RA pressure 6-10 mmHg.  Impressions:  - Normal LV size and systolic function, EF 60-65%. Moderate diastolic dysfunction. Normal RV size and systolic function. Borderline pulmonary hypertension.   IMAGING STUDIES Dg Chest 2 View  08/31/2011  *RADIOLOGY REPORT*  Clinical Data: Chest pain.  Left arm pain.  Neck pain.  CHEST - 2 VIEW  Comparison: 12/08/2010 and 12/05/2008  Findings: The heart size and pulmonary vascularity are normal and the lungs are clear.  No acute osseous abnormality.  Evidence of prior median sternotomy.  IMPRESSION: No acute disease.  Original Report Authenticated By: Gwynn Burly, M.D.   Dg Cervical Spine Complete  09/01/2011  *RADIOLOGY REPORT*  Clinical Data: Left neck and shoulder  pain  CERVICAL SPINE - 4+ VIEWS  Comparison:  None.  Findings:  There is no evidence of cervical spine fracture or prevertebral soft tissue swelling.  Alignment is normal.  No other significant bone  abnormalities are identified.  IMPRESSION: Negative cervical spine radiographs.  Original Report Authenticated By: Harrel Lemon, M.D.   Ct Angio Chest W/cm &/or Wo Cm  09/02/2011  *RADIOLOGY REPORT*  Clinical Data: Elevated D-dimer and chest pain.  The patient received 13 hour premedication for contrast allergy history.  CT ANGIOGRAPHY CHEST  Technique:  Multidetector CT imaging of the chest using the standard protocol during bolus administration of intravenous contrast. Multiplanar reconstructed images including MIPs were obtained and reviewed to evaluate the vascular anatomy.  Contrast: OMNIPAQUE IOHEXOL 350 MG/ML SOLN  Comparison: None.  Findings: Technically adequate study with moderately good opacification of the central and segmental pulmonary arteries.  No focal filling defects.  No evidence of significant pulmonary embolus.  There is dilatation of the pulmonary arteries consistent with arterial hypertension.  Postoperative changes in the mediastinum.  Cardiac enlargement.  Calcification of coronary arteries and aorta.  No aortic aneurysm.  No significant lymphadenopathy in the chest.  The esophagus is decompressed. 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  Visualized portions of the upper abdomen demonstrate diffuse nodular contour of the liver with lateral segment left lobe enlargement consistent with cirrhosis. 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 There is loss of distinction of fat planes around the celiac axis which might be due to edema or inflammatory process.  This is incompletely evaluated on this study.  No focal airspace consolidation in the lungs.  Mild dependent changes and atelectasis in the lung bases.  Airways appear patent. No pleural effusions.  No pneumothorax.  Degenerative changes in the thoracic spine.  IMPRESSION: No evidence of significant pulmonary embolus.  Cardiac enlargement with evidence of  pulmonary arterial hypertension.  No evidence of active consolidation in the lungs.  Incidental note of changes of cirrhosis and portal venous hypertension in the upper abdomen. Nonspecific hazy infiltration around the celiac axis is incompletely visualized.  Original Report Authenticated By: Marlon Pel, M.D.    DISCHARGE EXAMINATION: Blood pressure 121/74, pulse 71, temperature 97.8 F (36.6 C), temperature source Oral, resp. rate 16, height 5\' 3"  (1.6 m), weight 79.1 kg (174 lb 6.1 oz), SpO2 95.00%. General appearance: alert, cooperative, appears stated age and no distress Resp: clear to auscultation bilaterally Cardio: regular rate and rhythm, S1, S2 normal. Systolic murmur at apex. No click, rub or gallop GI: soft, non-tender; bowel sounds normal; no masses,  no organomegaly Neurologic: Grossly normal. No focal deficits.  DISPOSITION: Home  Discharge Orders    Future Orders Please Complete By Expires   Diet Carb Modified      Increase activity slowly      Discharge instructions      Comments:   Don't resume Metformin before 09/04/11     Current Discharge Medication List    START taking these medications   Details  aspirin EC 81 MG tablet Take 1 tablet (81 mg total) by mouth daily. Qty: 30 tablet, Refills: 1      CONTINUE these medications which have CHANGED   Details  metFORMIN (GLUCOPHAGE) 1000 MG tablet Take 1 tablet (1,000 mg total) by mouth 2 (two) times daily with a meal. Resume on 09/04/11      CONTINUE these medications which have NOT CHANGED   Details  Cholecalciferol (  VITAMIN D PO) Take 1 tablet by mouth daily.    glipiZIDE (GLUCOTROL) 10 MG tablet Take 10 mg by mouth daily.     hydrOXYzine (ATARAX/VISTARIL) 25 MG tablet Take 25 mg by mouth at bedtime.    metoprolol (LOPRESSOR) 50 MG tablet Take 50 mg in the morning and 25 mg in the evening. Qty: 45 tablet, Refills: 5    Multiple Vitamins-Minerals (ZINC PO) Take 1 tablet by mouth daily.    !! OVER  THE COUNTER MEDICATION Take 1 tablet by mouth daily. Vitamin C/Zinc    !! OVER THE COUNTER MEDICATION Place 2 drops into both eyes every morning. OTC allergy eye drops    pantoprazole (PROTONIX) 20 MG tablet Take 20 mg by mouth 2 (two) times daily.     !! - Potential duplicate medications found. Please discuss with provider.     Follow-up Information    Follow up with Peter Swaziland, MD. Schedule an appointment as soon as possible for a visit in 2 weeks. (for chest pain)    Contact information:   [address] [place] [address] [phone]       Follow up with Herb Grays, MD. Schedule an appointment as soon as possible for a visit in 1 week. (post hospitalization follow up)    Contact information:   [address] [place] [address] [phone]          TOTAL DISCHARGE TIME: 35 mins  [facility]  [facility] Pager [phone]  09/02/2011, 10:18 AM

## 2011-09-02 NOTE — ED Provider Notes (Signed)
Medical screening examination/treatment/procedure(s) were performed by non-physician practitioner and as supervising physician I was immediately available for consultation/collaboration.   Maegan Buller M Ramces Shomaker, DO 09/02/11 1417 

## 2011-09-23 ENCOUNTER — Ambulatory Visit (INDEPENDENT_AMBULATORY_CARE_PROVIDER_SITE_OTHER): Payer: 59 | Admitting: Cardiology

## 2011-09-23 ENCOUNTER — Encounter: Payer: Self-pay | Admitting: Cardiology

## 2011-09-23 VITALS — BP 124/64 | HR 63 | Ht 63.0 in | Wt 182.0 lb

## 2011-09-23 DIAGNOSIS — R079 Chest pain, unspecified: Secondary | ICD-10-CM | POA: Insufficient documentation

## 2011-09-23 DIAGNOSIS — Q249 Congenital malformation of heart, unspecified: Secondary | ICD-10-CM

## 2011-09-23 NOTE — Assessment & Plan Note (Signed)
I think her chest pain symptoms are atypical. Clearly her left neck and arm pain are musculoskeletal. Given her negative evaluation in the hospital I do not feel that she needs further testing at this point unless she were to have recurrent chest discomfort. We will continue with her current medication and I'll plan on seeing her again in 6 months. If she has recurrent chest pain I asked that she call.

## 2011-09-23 NOTE — Patient Instructions (Signed)
Continue your current therapy.  Call if you have more chest pain.  I will see you again in 6 months.

## 2011-09-23 NOTE — Progress Notes (Signed)
Natalie Lane Date of Birth: Sep 02, 1945 Medical Record #[id number]  History of Present Illness: Natalie Lane is seen today for followup after recent hospitalization. She was in her usual state of health until she awoke one morning with complaints of left neck and left arm pain. Later in the day she was working at her desk when she developed a heaviness in her mid chest. This did not go away and so she presented to the emergency room. She was admitted overnight. Cardiac enzymes were negative. ECG showed no acute changes. She had a CT of the chest which showed evidence of pulmonary emboli. It did mention some cardiomegaly and evidence of pulmonary hypertension. An echocardiogram showed normal left jugular size and function. There was mild biatrial enlargement. Estimated pulmonary artery pressure was normal. Her chest pain resolved and has not recurred. She continues to have a constant pain in her left neck and left arm. When she turns her arm a certain way she gets shooting pains all the way down her arm. She denies any dyspnea. She has no history of coronary disease. She did have a normal stress echo several years ago. She reports that she walks her dogs 30 minutes twice a day without any difficulty.   Current Outpatient Prescriptions on File Prior to Visit  Medication Sig Dispense Refill  . aspirin EC 81 MG tablet Take 1 tablet (81 mg total) by mouth daily.  30 tablet  1  . CALCIUM PO Take by mouth as directed.      . Cholecalciferol (VITAMIN D PO) Take 1 tablet by mouth daily.      Marland Kitchen glipiZIDE (GLUCOTROL) 10 MG tablet Take 10 mg by mouth daily.       . hydrOXYzine (ATARAX/VISTARIL) 25 MG tablet Take 25 mg by mouth at bedtime.      . metFORMIN (GLUCOPHAGE) 1000 MG tablet Take 1 tablet (1,000 mg total) by mouth 2 (two) times daily with a meal. Resume on 09/04/11      . metoprolol (LOPRESSOR) 50 MG tablet Take 50 mg in the morning and 25 mg in the evening.  45 tablet  5  . Multiple  Vitamins-Minerals (ZINC PO) Take 1 tablet by mouth daily.      Marland Kitchen OVER THE COUNTER MEDICATION Take 1 tablet by mouth daily. Vitamin C/Zinc      . OVER THE COUNTER MEDICATION Place 2 drops into both eyes every morning. OTC allergy eye drops      . pantoprazole (PROTONIX) 20 MG tablet Take 20 mg by mouth 2 (two) times daily.        Allergies  Allergen Reactions  . Other Anaphylaxis    MSG  . Red Dye Anaphylaxis  . Erythromycin Other (See Comments)    migraine  . Penicillins Other (See Comments)    headaches  . Sulfa Antibiotics Hives  . Iohexol Rash    Past Medical History  Diagnosis Date  . Atrial flutter   . 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   . Edema   . Diabetes mellitus, type 2   . Hyperlipidemia   . Anemia   . Normal stress echocardiogram 2008  . Cirrhosis     Past Surgical History  Procedure Date  . Asd versus vsd repair 1978    Operative notes not available.  . Atrial ablation surgery 10 of 2011  . Other surgical history     partial hysterectomy  .  Cholecystectomy   . Back surgery 1986  . Hernia repair   . Abdominal hysterectomy     History  Smoking status  . Former Smoker -- 1.0 packs/day for 36 years  . Types: Cigarettes  . Quit date: 06/25/2007  Smokeless tobacco  . Never Used    History  Alcohol Use No    Family History  Problem Relation Age of Onset  . Kidney disease Sister   . Depression Paternal Grandfather   . Diabetes Brother   . Hypertension Mother     Review of Systems: The review of systems is per the HPI.  All other systems were reviewed and are negative.  Physical Exam: BP 124/64  Pulse 63  Ht 5\' 3"  (1.6 m)  Wt 82.555 kg (182 lb)  BMI 32.24 kg/m2  SpO2 98% Patient is very pleasant and in no acute distress. She is obese. Skin is warm and dry. Color is normal.  HEENT is unremarkable. Normocephalic/atraumatic. PERRL. Sclera are nonicteric. Neck is supple. No  masses. No JVD. Lungs are clear. Cardiac exam shows a regular rate and rhythm. She has a soft 2/6 systolic murmur at the right and left upper sternal border.. Abdomen is soft. Extremities are without significant edema today. Gait and ROM are intact. No gross neurologic deficits noted.   LABORATORY DATA:   Assessment / Plan:

## 2011-09-27 ENCOUNTER — Telehealth: Payer: Self-pay

## 2011-09-27 NOTE — Telephone Encounter (Signed)
Received phone call from Dr.Tammy Spears office asking question about Dr.Jordan's 09/23/11 office note,in note says chest ct from hospital showed evidence of pulmonary emboli.Discharge summary 09/02/11 says ct negative for PE.

## 2013-03-28 DIAGNOSIS — IMO0001 Reserved for inherently not codable concepts without codable children: Secondary | ICD-10-CM | POA: Diagnosis not present

## 2013-03-28 DIAGNOSIS — M25519 Pain in unspecified shoulder: Secondary | ICD-10-CM | POA: Diagnosis not present

## 2013-03-28 DIAGNOSIS — L259 Unspecified contact dermatitis, unspecified cause: Secondary | ICD-10-CM | POA: Diagnosis not present

## 2013-04-05 ENCOUNTER — Ambulatory Visit: Payer: 59 | Admitting: Cardiology

## 2013-04-18 DIAGNOSIS — M25819 Other specified joint disorders, unspecified shoulder: Secondary | ICD-10-CM | POA: Diagnosis not present

## 2013-05-09 DIAGNOSIS — Z23 Encounter for immunization: Secondary | ICD-10-CM | POA: Diagnosis not present

## 2013-05-09 DIAGNOSIS — IMO0001 Reserved for inherently not codable concepts without codable children: Secondary | ICD-10-CM | POA: Diagnosis not present

## 2013-05-16 ENCOUNTER — Ambulatory Visit: Payer: 59 | Admitting: Cardiology

## 2013-05-17 ENCOUNTER — Ambulatory Visit (INDEPENDENT_AMBULATORY_CARE_PROVIDER_SITE_OTHER): Payer: Medicare Other | Admitting: Cardiology

## 2013-05-17 ENCOUNTER — Encounter: Payer: Self-pay | Admitting: Cardiology

## 2013-05-17 VITALS — BP 126/60 | HR 86 | Ht 63.0 in | Wt 171.8 lb

## 2013-05-17 DIAGNOSIS — I4892 Unspecified atrial flutter: Secondary | ICD-10-CM | POA: Diagnosis not present

## 2013-05-17 DIAGNOSIS — Q249 Congenital malformation of heart, unspecified: Secondary | ICD-10-CM | POA: Diagnosis not present

## 2013-05-17 DIAGNOSIS — E119 Type 2 diabetes mellitus without complications: Secondary | ICD-10-CM

## 2013-05-17 NOTE — Patient Instructions (Signed)
Continue your current therapy  I will see you in one year   

## 2013-05-18 NOTE — Progress Notes (Signed)
Natalie Lane Date of Birth: 08-Oct-1945 Medical Record #[id number]  History of Present Illness: Natalie Lane is seen today for followup. She is a pleasant 68 yo WF that I have seen in the past. She has a remote history of atrial flutter. 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 She had a normal stress Echo in the past. Echo in 2013 showed no shunt and normal LV/RV function. On follow up today she states she is doing very well. She is now divorced and liberated from a very difficult situation. Is very upbeat now. Denies any chest pain, SOB, palpitations. Mild ankle edema on occasion.    Current Outpatient Prescriptions on File Prior to Visit  Medication Sig Dispense Refill  . furosemide (LASIX) 20 MG tablet Take 20 mg by mouth as needed.       . metoprolol (LOPRESSOR) 50 MG tablet Take 50 mg in the morning and 25 mg in the evening.  45 tablet  5  . Multiple Vitamins-Minerals (ZINC PO) Take 1 tablet by mouth daily.      . pantoprazole (PROTONIX) 20 MG tablet Take 20 mg by mouth 2 (two) times daily.       No current facility-administered medications on file prior to visit.    Allergies  Allergen Reactions  . Other Anaphylaxis    MSG  . Red Dye Anaphylaxis  . Erythromycin Other (See Comments)    migraine  . Penicillins Other (See Comments)    headaches  . Sulfa Antibiotics Hives  . Iohexol Rash    Past Medical History  Diagnosis Date  . Atrial flutter   . 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   . Edema   . Diabetes mellitus, type 2   . Hyperlipidemia   . Anemia   . Normal stress echocardiogram 2008  . Cirrhosis     Past Surgical History  Procedure Laterality Date  . Asd versus vsd repair  1978    Operative notes not available.  . Atrial ablation surgery  10 of 2011  . Other surgical history      partial hysterectomy  . Cholecystectomy    . Back surgery  1986  . Hernia repair    . Abdominal  hysterectomy      History  Smoking status  . Former Smoker -- 1.00 packs/day for 36 years  . Types: Cigarettes  . Quit date: 06/25/2007  Smokeless tobacco  . Never Used    History  Alcohol Use No    Family History  Problem Relation Age of Onset  . Kidney disease Sister   . Depression Paternal Grandfather   . Diabetes Brother   . Hypertension Mother     Review of Systems: The review of systems is per the HPI.  All other systems were reviewed and are negative.  Physical Exam: BP 126/60  Pulse 86  Ht 5\' 3"  (1.6 m)  Wt 171 lb 12.8 oz (77.928 kg)  BMI 30.44 kg/m2 Patient is very pleasant and in no acute distress. She is obese. Skin is warm and dry. Color is normal.  HEENT is unremarkable. Normocephalic/atraumatic. PERRL. Sclera are nonicteric. Neck is supple. No masses. No JVD. Lungs are clear. Cardiac exam shows a regular rate and rhythm. She has a soft 2/6 systolic murmur at the right and left upper sternal border.. Abdomen is soft. Extremities are without significant edema today. Gait and ROM are intact.  No gross neurologic deficits noted.   LABORATORY DATA:  Ecg: NSR with old septal infarct. T wave inversion consistent with anterior ischemia. No change from 08/31/11  Assessment / Plan: 1. Remote VSD repair. No shunt by last Echo. Asymptomatic.  2. Remote history of atrial flutter. Resolved.   I will follow up in one year.

## 2013-07-02 ENCOUNTER — Other Ambulatory Visit: Payer: Self-pay | Admitting: Family Medicine

## 2013-07-02 ENCOUNTER — Ambulatory Visit
Admission: RE | Admit: 2013-07-02 | Discharge: 2013-07-02 | Disposition: A | Discharge status: Home / Self Care | Payer: Medicare Other | Source: Ambulatory Visit | Attending: Family Medicine | Admitting: Family Medicine

## 2013-07-02 DIAGNOSIS — IMO0001 Reserved for inherently not codable concepts without codable children: Secondary | ICD-10-CM | POA: Diagnosis not present

## 2013-07-02 DIAGNOSIS — R140 Abdominal distension (gaseous): Secondary | ICD-10-CM

## 2013-07-02 DIAGNOSIS — K7460 Unspecified cirrhosis of liver: Secondary | ICD-10-CM | POA: Diagnosis not present

## 2013-07-02 DIAGNOSIS — R109 Unspecified abdominal pain: Secondary | ICD-10-CM

## 2013-07-02 DIAGNOSIS — R143 Flatulence: Secondary | ICD-10-CM | POA: Diagnosis not present

## 2013-07-02 DIAGNOSIS — R141 Gas pain: Secondary | ICD-10-CM | POA: Diagnosis not present

## 2013-07-04 ENCOUNTER — Other Ambulatory Visit (HOSPITAL_COMMUNITY): Payer: Self-pay | Admitting: Gastroenterology

## 2013-07-04 DIAGNOSIS — R188 Other ascites: Secondary | ICD-10-CM

## 2013-07-04 DIAGNOSIS — K7460 Unspecified cirrhosis of liver: Secondary | ICD-10-CM | POA: Diagnosis not present

## 2013-07-06 ENCOUNTER — Ambulatory Visit (HOSPITAL_COMMUNITY)
Admission: RE | Admit: 2013-07-06 | Discharge: 2013-07-06 | Disposition: A | Discharge status: Home / Self Care | Payer: Medicare Other | Source: Ambulatory Visit | Attending: Gastroenterology | Admitting: Gastroenterology

## 2013-07-06 DIAGNOSIS — R188 Other ascites: Secondary | ICD-10-CM | POA: Insufficient documentation

## 2013-07-06 DIAGNOSIS — R899 Unspecified abnormal finding in specimens from other organs, systems and tissues: Secondary | ICD-10-CM | POA: Diagnosis not present

## 2013-07-06 LAB — BODY FLUID CELL COUNT WITH DIFFERENTIAL
Eos, Fluid: NONE SEEN %
Lymphs, Fluid: 38 %
Monocyte-Macrophage-Serous Fluid: 56 % (ref 50–90)
Neutrophil Count, Fluid: 6 % (ref 0–25)
WBC FLUID: 530 uL (ref 0–1000)

## 2013-07-06 LAB — ALBUMIN, FLUID (OTHER): ALBUMIN FL: 1.2 g/dL

## 2013-07-06 MED ORDER — ALBUMIN HUMAN 25 % IV SOLN
50.0000 g | Freq: Once | INTRAVENOUS | Status: AC
  Administered 2013-07-06: 50 g via INTRAVENOUS
  Filled 2013-07-06: qty 200

## 2013-07-06 NOTE — Procedures (Signed)
Successful US guided paracentesis from RLQ.  Yielded 4.4 liters of clear yellow fluid.  No immediate complications.  Pt tolerated well.   Specimen was sent for labs.  Hedy Jacob PA-C 07/06/2013 2:37 PM

## 2013-07-10 DIAGNOSIS — R188 Other ascites: Secondary | ICD-10-CM | POA: Diagnosis not present

## 2013-07-10 DIAGNOSIS — K7460 Unspecified cirrhosis of liver: Secondary | ICD-10-CM | POA: Diagnosis not present

## 2013-07-31 DIAGNOSIS — K7460 Unspecified cirrhosis of liver: Secondary | ICD-10-CM | POA: Diagnosis not present

## 2013-08-13 DIAGNOSIS — K7460 Unspecified cirrhosis of liver: Secondary | ICD-10-CM | POA: Diagnosis not present

## 2013-08-13 DIAGNOSIS — IMO0001 Reserved for inherently not codable concepts without codable children: Secondary | ICD-10-CM | POA: Diagnosis not present

## 2013-08-28 DIAGNOSIS — R42 Dizziness and giddiness: Secondary | ICD-10-CM | POA: Diagnosis not present

## 2013-08-28 DIAGNOSIS — K7460 Unspecified cirrhosis of liver: Secondary | ICD-10-CM | POA: Diagnosis not present

## 2013-08-28 DIAGNOSIS — IMO0001 Reserved for inherently not codable concepts without codable children: Secondary | ICD-10-CM | POA: Diagnosis not present

## 2013-08-28 DIAGNOSIS — M79609 Pain in unspecified limb: Secondary | ICD-10-CM | POA: Diagnosis not present

## 2013-09-27 DIAGNOSIS — IMO0001 Reserved for inherently not codable concepts without codable children: Secondary | ICD-10-CM | POA: Diagnosis not present

## 2013-10-05 ENCOUNTER — Other Ambulatory Visit: Payer: Self-pay | Admitting: Gastroenterology

## 2013-10-05 DIAGNOSIS — D1330 Benign neoplasm of unspecified part of small intestine: Secondary | ICD-10-CM | POA: Diagnosis not present

## 2013-10-05 DIAGNOSIS — K319 Disease of stomach and duodenum, unspecified: Secondary | ICD-10-CM | POA: Diagnosis not present

## 2013-10-05 DIAGNOSIS — D131 Benign neoplasm of stomach: Secondary | ICD-10-CM | POA: Diagnosis not present

## 2013-10-05 DIAGNOSIS — I8500 Esophageal varices without bleeding: Secondary | ICD-10-CM | POA: Diagnosis not present

## 2013-10-05 DIAGNOSIS — K766 Portal hypertension: Secondary | ICD-10-CM | POA: Diagnosis not present

## 2013-10-12 DIAGNOSIS — I8501 Esophageal varices with bleeding: Secondary | ICD-10-CM | POA: Diagnosis not present

## 2013-10-12 DIAGNOSIS — D135 Benign neoplasm of extrahepatic bile ducts: Secondary | ICD-10-CM | POA: Diagnosis not present

## 2013-10-12 DIAGNOSIS — K7460 Unspecified cirrhosis of liver: Secondary | ICD-10-CM | POA: Diagnosis not present

## 2013-10-12 DIAGNOSIS — D134 Benign neoplasm of liver: Secondary | ICD-10-CM | POA: Diagnosis not present

## 2013-10-15 DIAGNOSIS — IMO0001 Reserved for inherently not codable concepts without codable children: Secondary | ICD-10-CM | POA: Diagnosis not present

## 2013-10-15 DIAGNOSIS — H00019 Hordeolum externum unspecified eye, unspecified eyelid: Secondary | ICD-10-CM | POA: Diagnosis not present

## 2013-10-23 DIAGNOSIS — K7460 Unspecified cirrhosis of liver: Secondary | ICD-10-CM | POA: Diagnosis not present

## 2013-10-31 ENCOUNTER — Telehealth: Payer: Self-pay | Admitting: Cardiology

## 2013-10-31 NOTE — Telephone Encounter (Signed)
Received a call from [facility] at [place] office.She stated Dr.Schooler wanted to ask Dr.Jordan if ok to change metoprolol to propranolol 20 mg twice a day.Stated patient cirrhosis and she is high risk for bleeding.Stated propranolol is preferred.Dr.Jordan out of office this week will check with him next week and call back.

## 2013-11-06 ENCOUNTER — Encounter (HOSPITAL_COMMUNITY): Payer: Self-pay | Admitting: Pharmacy Technician

## 2013-11-08 MED ORDER — PROPRANOLOL HCL 20 MG PO TABS: 20.0000 mg | ORAL_TABLET | Freq: Two times a day (BID) | ORAL | Status: DC

## 2013-11-08 NOTE — Telephone Encounter (Signed)
Returned call to patient spoke to [place] he advised ok to stop metoprolol and start propranolol 20 mg twice a day.

## 2013-11-13 ENCOUNTER — Other Ambulatory Visit: Payer: Self-pay | Admitting: Gastroenterology

## 2013-11-13 DIAGNOSIS — R944 Abnormal results of kidney function studies: Secondary | ICD-10-CM | POA: Diagnosis not present

## 2013-11-13 NOTE — Addendum Note (Signed)
Addended by: Wilford Corner on: 11/13/2013 01:43 PM   Modules accepted: Orders

## 2013-11-15 ENCOUNTER — Encounter (HOSPITAL_COMMUNITY): Payer: Self-pay | Admitting: *Deleted

## 2013-11-16 ENCOUNTER — Ambulatory Visit (HOSPITAL_COMMUNITY): Payer: Medicare Other

## 2013-11-16 ENCOUNTER — Encounter (HOSPITAL_COMMUNITY): Payer: Self-pay | Admitting: Anesthesiology

## 2013-11-16 ENCOUNTER — Encounter (HOSPITAL_COMMUNITY): Payer: Medicare Other | Admitting: Anesthesiology

## 2013-11-16 ENCOUNTER — Ambulatory Visit (HOSPITAL_COMMUNITY)
Admission: RE | Admit: 2013-11-16 | Discharge: 2013-11-16 | Disposition: A | Discharge status: Home / Self Care | Payer: Medicare Other | Source: Ambulatory Visit | Attending: Gastroenterology | Admitting: Gastroenterology

## 2013-11-16 ENCOUNTER — Encounter (HOSPITAL_COMMUNITY)
Admission: RE | Disposition: A | Discharge status: Home / Self Care | Payer: Self-pay | Source: Ambulatory Visit | Attending: Gastroenterology

## 2013-11-16 ENCOUNTER — Ambulatory Visit (HOSPITAL_COMMUNITY): Payer: Medicare Other | Admitting: Anesthesiology

## 2013-11-16 DIAGNOSIS — Z87891 Personal history of nicotine dependence: Secondary | ICD-10-CM | POA: Insufficient documentation

## 2013-11-16 DIAGNOSIS — E119 Type 2 diabetes mellitus without complications: Secondary | ICD-10-CM | POA: Insufficient documentation

## 2013-11-16 DIAGNOSIS — Q248 Other specified congenital malformations of heart: Secondary | ICD-10-CM | POA: Insufficient documentation

## 2013-11-16 DIAGNOSIS — I8510 Secondary esophageal varices without bleeding: Secondary | ICD-10-CM | POA: Diagnosis not present

## 2013-11-16 DIAGNOSIS — K7460 Unspecified cirrhosis of liver: Secondary | ICD-10-CM | POA: Diagnosis not present

## 2013-11-16 DIAGNOSIS — R12 Heartburn: Secondary | ICD-10-CM | POA: Insufficient documentation

## 2013-11-16 DIAGNOSIS — Z882 Allergy status to sulfonamides status: Secondary | ICD-10-CM | POA: Diagnosis not present

## 2013-11-16 DIAGNOSIS — I8500 Esophageal varices without bleeding: Secondary | ICD-10-CM | POA: Diagnosis not present

## 2013-11-16 DIAGNOSIS — Z91041 Radiographic dye allergy status: Secondary | ICD-10-CM | POA: Insufficient documentation

## 2013-11-16 DIAGNOSIS — R002 Palpitations: Secondary | ICD-10-CM | POA: Insufficient documentation

## 2013-11-16 DIAGNOSIS — R079 Chest pain, unspecified: Secondary | ICD-10-CM | POA: Diagnosis not present

## 2013-11-16 DIAGNOSIS — K2950 Unspecified chronic gastritis without bleeding: Secondary | ICD-10-CM | POA: Diagnosis not present

## 2013-11-16 DIAGNOSIS — Z888 Allergy status to other drugs, medicaments and biological substances status: Secondary | ICD-10-CM | POA: Diagnosis not present

## 2013-11-16 DIAGNOSIS — Z880 Allergy status to penicillin: Secondary | ICD-10-CM | POA: Insufficient documentation

## 2013-11-16 DIAGNOSIS — D132 Benign neoplasm of duodenum: Secondary | ICD-10-CM | POA: Insufficient documentation

## 2013-11-16 DIAGNOSIS — R188 Other ascites: Secondary | ICD-10-CM

## 2013-11-16 DIAGNOSIS — D135 Benign neoplasm of extrahepatic bile ducts: Secondary | ICD-10-CM | POA: Diagnosis not present

## 2013-11-16 DIAGNOSIS — R918 Other nonspecific abnormal finding of lung field: Secondary | ICD-10-CM | POA: Diagnosis not present

## 2013-11-16 DIAGNOSIS — E785 Hyperlipidemia, unspecified: Secondary | ICD-10-CM | POA: Insufficient documentation

## 2013-11-16 DIAGNOSIS — K317 Polyp of stomach and duodenum: Secondary | ICD-10-CM | POA: Diagnosis not present

## 2013-11-16 DIAGNOSIS — Z881 Allergy status to other antibiotic agents status: Secondary | ICD-10-CM | POA: Diagnosis not present

## 2013-11-16 DIAGNOSIS — I4892 Unspecified atrial flutter: Secondary | ICD-10-CM | POA: Diagnosis not present

## 2013-11-16 DIAGNOSIS — K2970 Gastritis, unspecified, without bleeding: Secondary | ICD-10-CM | POA: Diagnosis not present

## 2013-11-16 HISTORY — DX: Adverse effect of unspecified anesthetic, initial encounter: T41.45XA

## 2013-11-16 HISTORY — DX: Other specified postprocedural states: Z98.890

## 2013-11-16 HISTORY — DX: Other specified postprocedural states: R11.2

## 2013-11-16 HISTORY — DX: Other complications of anesthesia, initial encounter: T88.59XA

## 2013-11-16 HISTORY — DX: Gastro-esophageal reflux disease without esophagitis: K21.9

## 2013-11-16 HISTORY — PX: GASTRIC VARICES BANDING: SHX5519

## 2013-11-16 HISTORY — PX: ESOPHAGOGASTRODUODENOSCOPY (EGD) WITH PROPOFOL: SHX5813

## 2013-11-16 HISTORY — DX: Personal history of other medical treatment: Z92.89

## 2013-11-16 LAB — GLUCOSE, CAPILLARY: Glucose-Capillary: 142 mg/dL — ABNORMAL HIGH (ref 70–99)

## 2013-11-16 SURGERY — ESOPHAGOGASTRODUODENOSCOPY (EGD) WITH PROPOFOL
Anesthesia: Monitor Anesthesia Care

## 2013-11-16 MED ORDER — MIDAZOLAM HCL 5 MG/5ML IJ SOLN
INTRAMUSCULAR | Status: DC | PRN
  Administered 2013-11-16: 1 mg via INTRAVENOUS

## 2013-11-16 MED ORDER — SODIUM CHLORIDE 0.9 % IV SOLN: INTRAVENOUS | Status: DC

## 2013-11-16 MED ORDER — PROPOFOL INFUSION 10 MG/ML OPTIME
INTRAVENOUS | Status: DC | PRN
  Administered 2013-11-16: 100 ug/kg/min via INTRAVENOUS

## 2013-11-16 MED ORDER — LACTATED RINGERS IV SOLN
INTRAVENOUS | Status: DC | PRN
  Administered 2013-11-16 (×2): via INTRAVENOUS

## 2013-11-16 MED ORDER — LACTATED RINGERS IV SOLN
INTRAVENOUS | Status: DC
  Administered 2013-11-16: 08:00:00 via INTRAVENOUS

## 2013-11-16 MED ORDER — FENTANYL CITRATE 0.05 MG/ML IJ SOLN
INTRAMUSCULAR | Status: DC | PRN
  Administered 2013-11-16: 50 ug via INTRAVENOUS

## 2013-11-16 NOTE — Transfer of Care (Signed)
Immediate Anesthesia Transfer of Care Note  Patient: Natalie Lane  Procedure(s) Performed: Procedure(s): ESOPHAGOGASTRODUODENOSCOPY (EGD) WITH PROPOFOL (N/A) GASTRIC VARICES BANDING (N/A)  Patient Location: PACU  Anesthesia Type:MAC  Level of Consciousness: awake, alert  and oriented  Airway & Oxygen Therapy: Patient Spontanous Breathing and Patient connected to nasal cannula oxygen  Post-op Assessment: Report given to PACU RN  Post vital signs: Reviewed and stable  Complications: No apparent anesthesia complications

## 2013-11-16 NOTE — Discharge Instructions (Addendum)
Clear liquids only today. Advance to soft foods this weekend and then advance further to carb modified diet.  Esophageal Varices Esophageal varices are blood vessels in the esophagus (the tube that carries food to the stomach). Under normal circumstances, these blood vessels carry very small amounts of blood. If the liver is damaged, and the main vein (portal vein) that carries blood is blocked, larger amounts of blood might back up into these esophageal varices. The esophageal varices are too fragile for this extra blood flow and pressure. They may swell and then break, causing life-threatening bleeding (hemorrhage). CAUSES  Any kind of liver disease can cause esophageal varices. Cirrhosis of the liver, usually due to alcoholism, is the most common reason. Other reasons include:  Severe heart failure: When the heart cannot pump blood around the body effectively enough, pressure may rise in the portal vein.  A blood clot in the portal vein.  Sarcoidosis. This is an inflammatory disease that can affect the liver.  Schistosomiasis. A parasitic infection that can cause liver damage. SYMPTOMS  Symptoms may include:  Vomiting bright red or black coffee ground like material.  Black, tarry stools.  Low blood pressure.  Dizziness.  Loss of consciousness. DIAGNOSIS  When someone has known cirrhosis, their caregiver may screen them for the presence of esophageal varices. Tests that are used include:  Endoscopy (esophagogastroduodenoscopy or EGD). A thin, lighted tube is inserted through the mouth and into the esophagus. The caregiver will rate the varices according to their size and the presence of red streaks. These characteristics help determine the risk of bleeding.  Imaging tests. CT scans and MRI scans can both show esophageal varices. However, they cannot predict likelihood of bleeding. TREATMENT  There are different types of treatment used for esophageal varices. These  include:  Variceal ligation. During EGD, the caregiver places a rubber band around the vein to prevent bleeding.  Injection therapy. During EGD, the caregiver can inject the veins with a solution that shrinks them and scars them closed.  Medications can decrease the pressure in the esophageal varices and prevent bleeding.  Balloon tamponade. A tube is put into the esophagus and a balloon is passed down it. When the balloon is inflated, it puts pressures on the veins and stops the bleeding.  Shunt. A small tube is placed within the liver veins. This decreases the blood flow and pressure to the varices, decreasing bleeding risk.  Liver transplant may be done as a last resort. HOME CARE INSTRUCTIONS   Take all medications exactly as directed.  Follow any prescribed diet. Avoid alcohol if recommended.  Follow instructions regarding both rest and physical activity.  Seek help to treat a drinking problem. SEEK IMMEDIATE MEDICAL CARE IF:  You vomit blood or coffee-ground material.  You pass black tarry stools or bright red blood in the stools.  You are dizzy, lightheaded or faint.  You are unable to eat or drink.  You experience chest pain. Document Released: 04/24/2003 Document Revised: 04/26/2011 Document Reviewed: 04/04/2008 [facility] Patient Information 2015 [place], [place]. This information is not intended to replace advice given to you by your health care provider. Make sure you discuss any questions you have with your health care provider.  Esophagogastroduodenoscopy Care After Refer to this sheet in the next few weeks. These instructions provide you with information on caring for yourself after your procedure. Your caregiver may also give you more specific instructions. Your treatment has been planned according to current medical practices, but problems sometimes occur. Call  your caregiver if you have any problems or questions after your procedure.  HOME CARE INSTRUCTIONS  Do  not eat or drink anything until the numbing medicine (local anesthetic) has worn off and your gag reflex has returned. You will know that the local anesthetic has worn off when you can swallow comfortably.  Do not drive for 12 hours after the procedure or as directed by your caregiver.  Only take medicines as directed by your caregiver. SEEK MEDICAL CARE IF:   You cannot stop coughing.  You are not urinating at all or less than usual. SEEK IMMEDIATE MEDICAL CARE IF:  You have difficulty swallowing.  You cannot eat or drink.  You have worsening throat or chest pain.  You have dizziness, lightheadedness, or you faint.  You have nausea or vomiting.  You have chills.  You have a fever.  You have severe abdominal pain.  You have black, tarry, or bloody stools. Document Released: 01/19/2012 Document Reviewed: 01/19/2012 [facility] Patient Information 2015 [place]. This information is not intended to replace advice given to you by your health care provider. Make sure you discuss any questions you have with your health care provider. rther to carb modified diet as tolerated.

## 2013-11-16 NOTE — Anesthesia Postprocedure Evaluation (Signed)
  Anesthesia Post-op Note  Patient: Natalie Lane  Procedure(s) Performed: Procedure(s): ESOPHAGOGASTRODUODENOSCOPY (EGD) WITH PROPOFOL (N/A) GASTRIC VARICES BANDING (N/A)  Patient Location: PACU  Anesthesia Type:MAC  Level of Consciousness: awake and sedated  Airway and Oxygen Therapy: Patient Spontanous Breathing  Post-op Pain: mild  Post-op Assessment: Post-op Vital signs reviewed  Post-op Vital Signs: stable  Last Vitals:  Filed Vitals:   11/16/13 1053  BP: 129/68  Pulse: 67  Temp:   Resp: 15    Complications: No apparent anesthesia complications

## 2013-11-16 NOTE — H&P (Signed)
  Date of Initial H&P: 11/09/13  History reviewed, patient examined, no change in status, stable for surgery.

## 2013-11-16 NOTE — Anesthesia Preprocedure Evaluation (Addendum)
Anesthesia Evaluation  Patient identified by MRN, date of birth, ID band Patient awake    Reviewed: Allergy & Precautions, H&P , NPO status , Patient's Chart, lab work & pertinent test results  History of Anesthesia Complications (+) PONV  Airway Mallampati: I TM Distance: >3 FB Neck ROM: Limited    Dental  (+) Teeth Intact, Dental Advisory Given   Pulmonary Current Smoker,  breath sounds clear to auscultation        Cardiovascular hypertension, + dysrhythmias Atrial Fibrillation Rhythm:Regular Rate:Normal  S/p ASD repair.   Neuro/Psych    GI/Hepatic GERD-  ,(+) Cirrhosis -  Esophageal Varices    ,   Endo/Other  diabetes, Type 2, Insulin Dependent, Oral Hypoglycemic Agents  Renal/GU      Musculoskeletal   Abdominal   Peds  Hematology  (+) anemia ,   Anesthesia Other Findings Transthoracic Echocardiography  Patient:    Natalie Lane, Natalie Lane MR #:       [id number] Study Date: 09/01/2011 Gender:     F Age:        68 Height:     160cm Weight:     78.9kg BSA:        1.25m^2 Pt. Status: Room:       2011    [place], [place], [name]  [place], [place], [place]  SONOGRAPHER  [place], RDCS cc:  ------------------------------------------------------------ LV EF: 60% -   65%  ------------------------------------------------------------ Indications:      Shortness of breath 786.05.  ------------------------------------------------------------ History:   PMH:   Atrial flutter.  Congestive heart failure.  Risk factors:  Edema. Anemia. Palpitations. Former tobacco use. Diabetes mellitus.  ------------------------------------------------------------ Study Conclusions  - Left ventricle: The cavity size was normal. Wall thickness   was normal. Systolic function was normal. The estimated   ejection fraction  was in the range of 60% to 65%. Wall   motion was normal; there were no regional wall motion   abnormalities. Features are consistent with a pseudonormal   left ventricular filling pattern, with concomitant   abnormal relaxation and increased filling pressure (grade   2 diastolic dysfunction). - Aortic valve: There was no stenosis. - Mitral valve: Mildly calcified annulus. Mildly calcified   leaflets . Trivial regurgitation. - Left atrium: The atrium was mildly dilated. - Right ventricle: The cavity size was normal. Systolic   function was normal. - Right atrium: The atrium was mildly dilated. - Tricuspid valve: Peak RV-RA gradient: 64mm Hg (S). - Pulmonary arteries: PA systolic pressure 74-94 mmHg. - Systemic veins: IVC measured 2.2 cm with normal   respirophasic variation, suggesting RA pressure 6-10 mmHg. Impressions:  - Normal LV size and systolic function, EF 49-67%. Moderate   diastolic dysfunction. Normal RV size and systolic   function. Borderline pulmonary hypertension. Transthoracic echocardiography.  M-mode, complete 2D, spectral Doppler, and color Doppler.  Height:  Height: 160cm. Height: 63in.  Weight:  Weight: 78.9kg. Weight: 173.6lb.  Body mass index:  BMI: 30.8kg/m^2.  Body surface area:    BSA: 1.27m^2.  Blood pressure:     125/76.  Patient status:  Inpatient.  Location:  Bedside.   Reproductive/Obstetrics                         Anesthesia Physical Anesthesia Plan  ASA: III  Anesthesia Plan: MAC  Post-op Pain Management:    Induction: Intravenous  Airway Management Planned: Simple Face Mask  Additional Equipment:   Intra-op Plan:   Post-operative Plan:   Informed Consent: I have reviewed the patients History and Physical, chart, labs and discussed the procedure including the risks, benefits and alternatives for the proposed anesthesia with the patient or authorized representative who has indicated his/her understanding and  acceptance.   Dental advisory given  Plan Discussed with: CRNA, Anesthesiologist and Surgeon  Anesthesia Plan Comments:         Anesthesia Quick Evaluation

## 2013-11-16 NOTE — Op Note (Signed)
[facility] [place], [address]   ENDOSCOPY PROCEDURE REPORT  PATIENT: Natalie Lane, Natalie Lane  MR#: [id number] BIRTHDATE: 12-01-1945 , 62  yrs. old GENDER: female ENDOSCOPIST: Wilford Corner, MD REFERRED BY: PROCEDURE DATE:  11-18-13 PROCEDURE:  EGD w/ band ligation of varices ASA CLASS:     Class III INDICATIONS:  cirrhosis; esophageal varices banding. MEDICATIONS: Monitored anesthesia care and Per Anesthesia TOPICAL ANESTHETIC:  DESCRIPTION OF PROCEDURE: After the risks benefits and alternatives of the procedure were thoroughly explained, informed consent was obtained.  The [id number] ([id number]) endoscope was introduced through the mouth and advanced to the second portion of the duodenum , Without limitations.  The instrument was slowly withdrawn as the mucosa was fully examined.    Endoscope was advanced into the esophagus were medium-sized esophageal varices were noted that extended from 32 cm - 40 cm from the incisors. The endoscope was inserted into the stomach, which revealed mild antral gastritis. The large adenoma at the ampulla was again noted in the 2nd part of the duodenum. The smaller polyp in the duodenum also was noted.       Retroflexed views revealed no abnormalities.    The endoscope was withdrawn and then reinserted with the bander device attached. Four bands were placed in the distal and mid esophagus on the varices. Esophageal variceal band ligation X 4 completed. The scope was then withdrawn from the patient and the procedure completed.  COMPLICATIONS: There were no immediate complications.  ENDOSCOPIC IMPRESSION:     1. 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 Small sessile duodenal polyp 4. Antral gastritis  RECOMMENDATIONS:     PPI QD; F/U with [facility] regarding ampulla adenoma as previously planned; F/U in office in 3 months   eSigned:  Wilford Corner, MD 11-18-2013 11:03  AM    CC:  CPT CODES: ICD CODES:  The ICD and CPT codes recommended by this software are interpretations from the data that the clinical staff has captured with the software.  The verification of the translation of this report to the ICD and CPT codes and modifiers is the sole responsibility of the health care institution and practicing physician where this report was generated.  [employer] will not be held responsible for the validity of the ICD and CPT codes included on this report.  AMA assumes no liability for data contained or not contained herein. CPT is a Designer, television/film set of the [employer].  PATIENT NAME:  Natalie Lane, Natalie Lane MR#: [id number]

## 2013-11-16 NOTE — Brief Op Note (Signed)
Due to chest pain (7/10 in intensity) and heartburn will do a CXR to rule out free air. Likely due to the banding but will do CXR prior to discharge.

## 2013-11-16 NOTE — Interval H&P Note (Signed)
History and Physical Interval Note:  11/16/2013 10:04 AM  Natalie Lane  has presented today for surgery, with the diagnosis of cirrhosis  The various methods of treatment have been discussed with the patient and family. After consideration of risks, benefits and other options for treatment, the patient has consented to  Procedure(s): ESOPHAGOGASTRODUODENOSCOPY (EGD) WITH PROPOFOL (N/A) GASTRIC VARICES BANDING (N/A) as a surgical intervention .  The patient's history has been reviewed, patient examined, no change in status, stable for surgery.  I have reviewed the patient's chart and labs.  Questions were answered to the patient's satisfaction.     [name]

## 2013-11-19 ENCOUNTER — Encounter (HOSPITAL_COMMUNITY): Payer: Self-pay | Admitting: Gastroenterology

## 2013-11-27 DIAGNOSIS — R8290 Unspecified abnormal findings in urine: Secondary | ICD-10-CM | POA: Diagnosis not present

## 2013-11-27 DIAGNOSIS — E1165 Type 2 diabetes mellitus with hyperglycemia: Secondary | ICD-10-CM | POA: Diagnosis not present

## 2013-11-27 DIAGNOSIS — Z23 Encounter for immunization: Secondary | ICD-10-CM | POA: Diagnosis not present

## 2013-12-12 DIAGNOSIS — E860 Dehydration: Secondary | ICD-10-CM | POA: Diagnosis not present

## 2014-01-04 DIAGNOSIS — D135 Benign neoplasm of extrahepatic bile ducts: Secondary | ICD-10-CM | POA: Diagnosis not present

## 2014-01-22 ENCOUNTER — Encounter (HOSPITAL_COMMUNITY): Payer: Self-pay | Admitting: *Deleted

## 2014-01-22 ENCOUNTER — Other Ambulatory Visit: Payer: Self-pay | Admitting: Gastroenterology

## 2014-01-23 ENCOUNTER — Ambulatory Visit (HOSPITAL_COMMUNITY): Payer: Medicare Other | Admitting: Anesthesiology

## 2014-01-23 ENCOUNTER — Ambulatory Visit (HOSPITAL_COMMUNITY)
Admission: RE | Admit: 2014-01-23 | Discharge: 2014-01-23 | Disposition: A | Discharge status: Home / Self Care | Payer: Medicare Other | Source: Ambulatory Visit | Attending: Gastroenterology | Admitting: Gastroenterology

## 2014-01-23 ENCOUNTER — Encounter (HOSPITAL_COMMUNITY)
Admission: RE | Disposition: A | Discharge status: Home / Self Care | Payer: Self-pay | Source: Ambulatory Visit | Attending: Gastroenterology

## 2014-01-23 ENCOUNTER — Encounter (HOSPITAL_COMMUNITY): Payer: Self-pay

## 2014-01-23 DIAGNOSIS — K219 Gastro-esophageal reflux disease without esophagitis: Secondary | ICD-10-CM | POA: Diagnosis not present

## 2014-01-23 DIAGNOSIS — I4891 Unspecified atrial fibrillation: Secondary | ICD-10-CM | POA: Insufficient documentation

## 2014-01-23 DIAGNOSIS — Z951 Presence of aortocoronary bypass graft: Secondary | ICD-10-CM | POA: Insufficient documentation

## 2014-01-23 DIAGNOSIS — K7460 Unspecified cirrhosis of liver: Secondary | ICD-10-CM | POA: Diagnosis not present

## 2014-01-23 DIAGNOSIS — E119 Type 2 diabetes mellitus without complications: Secondary | ICD-10-CM | POA: Insufficient documentation

## 2014-01-23 DIAGNOSIS — Z794 Long term (current) use of insulin: Secondary | ICD-10-CM | POA: Insufficient documentation

## 2014-01-23 DIAGNOSIS — K319 Disease of stomach and duodenum, unspecified: Secondary | ICD-10-CM | POA: Insufficient documentation

## 2014-01-23 DIAGNOSIS — I8500 Esophageal varices without bleeding: Secondary | ICD-10-CM | POA: Insufficient documentation

## 2014-01-23 DIAGNOSIS — K317 Polyp of stomach and duodenum: Secondary | ICD-10-CM | POA: Diagnosis not present

## 2014-01-23 DIAGNOSIS — F17210 Nicotine dependence, cigarettes, uncomplicated: Secondary | ICD-10-CM | POA: Insufficient documentation

## 2014-01-23 HISTORY — PX: ESOPHAGOGASTRODUODENOSCOPY (EGD) WITH PROPOFOL: SHX5813

## 2014-01-23 HISTORY — PX: GASTRIC VARICES BANDING: SHX5519

## 2014-01-23 LAB — GLUCOSE, CAPILLARY
Glucose-Capillary: 160 mg/dL — ABNORMAL HIGH (ref 70–99)
Glucose-Capillary: 161 mg/dL — ABNORMAL HIGH (ref 70–99)

## 2014-01-23 SURGERY — ESOPHAGOGASTRODUODENOSCOPY (EGD) WITH PROPOFOL
Anesthesia: Monitor Anesthesia Care

## 2014-01-23 MED ORDER — SODIUM CHLORIDE 0.9 % IV SOLN: INTRAVENOUS | Status: DC

## 2014-01-23 MED ORDER — EPHEDRINE SULFATE 50 MG/ML IJ SOLN
INTRAMUSCULAR | Status: DC | PRN
  Administered 2014-01-23: 10 mg via INTRAVENOUS

## 2014-01-23 MED ORDER — FENTANYL CITRATE 0.05 MG/ML IJ SOLN
INTRAMUSCULAR | Status: DC | PRN
  Administered 2014-01-23: 50 ug via INTRAVENOUS

## 2014-01-23 MED ORDER — MIDAZOLAM HCL 5 MG/5ML IJ SOLN
INTRAMUSCULAR | Status: DC | PRN
  Administered 2014-01-23: 1 mg via INTRAVENOUS

## 2014-01-23 MED ORDER — LACTATED RINGERS IV SOLN
INTRAVENOUS | Status: DC | PRN
  Administered 2014-01-23: 09:00:00 via INTRAVENOUS

## 2014-01-23 MED ORDER — PROPOFOL INFUSION 10 MG/ML OPTIME
INTRAVENOUS | Status: DC | PRN
  Administered 2014-01-23: 75 ug/kg/min via INTRAVENOUS

## 2014-01-23 MED ORDER — LACTATED RINGERS IV SOLN
INTRAVENOUS | Status: DC
  Administered 2014-01-23: 1000 mL via INTRAVENOUS

## 2014-01-23 NOTE — Op Note (Signed)
[facility] [place], [address]   ENDOSCOPY PROCEDURE REPORT  PATIENT: Natalie Lane, Natalie Lane  MR#: [id number] BIRTHDATE: Jul 23, 1945 , 81  yrs. old GENDER: female ENDOSCOPIST: Wilford Corner, MD REFERRED BY: PROCEDURE DATE:  02-07-2014 PROCEDURE:  EGD, diagnostic ASA CLASS:     Class III INDICATIONS:  cirrhosis; history of esophageal varices. MEDICATIONS: Monitored anesthesia care and Per Anesthesia TOPICAL ANESTHETIC:  DESCRIPTION OF PROCEDURE: After the risks benefits and alternatives of the procedure were thoroughly explained, informed consent was obtained.  The Pentax Gastroscope [id number] endoscope was introduced through the mouth and advanced to the second portion of the duodenum , Without limitations.  The instrument was slowly withdrawn as the mucosa was fully examined.    Evaluation of the esophagus revealed scarring in the distal esophagus with trace to minimal esophageal varices that was not amenable to repeat banding. The endoscope was advanced into the stomach, which revealed mild portal gastropathy. The duodenal bulb was intubated and was normal in appearance. In the second part of the duodenum the previously seen large duodenal mass was noted, which was friable and bleeding occurred after contact with the endoscope. A small sessile duodenal polyp was also seen. Retroflexion revealed mild portal gastropathy with a mosaic pattern noted.          The scope was then withdrawn from the patient and the procedure completed.  COMPLICATIONS: There were no immediate complications.  ENDOSCOPIC IMPRESSION:     1. Minimal to trace distal esophageal varices with scarring not amenable to repeat banding 2. Mild portal gastropathy 3. 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 Sessile duodenal polyp  RECOMMENDATIONS:     F/U with [facility] as already scheduled   eSigned:  Wilford Corner, MD 07-Feb-2014  10:54 AM    CC:  CPT CODES: ICD CODES:  The ICD and CPT codes recommended by this software are interpretations from the data that the clinical staff has captured with the software.  The verification of the translation of this report to the ICD and CPT codes and modifiers is the sole responsibility of the health care institution and practicing physician where this report was generated.  [employer]. will not be held responsible for the validity of the ICD and CPT codes included on this report.  AMA assumes no liability for data contained or not contained herein. CPT is a Designer, television/film set of the [employer].  PATIENT NAME:  Artia, Singley MR#: [id number]

## 2014-01-23 NOTE — Anesthesia Postprocedure Evaluation (Signed)
  Anesthesia Post-op Note  Patient: Natalie Lane  Procedure(s) Performed: Procedure(s): ESOPHAGOGASTRODUODENOSCOPY (EGD) WITH PROPOFOL (N/A) GASTRIC VARICES BANDING (N/A)  Patient Location: PACU  Anesthesia Type:General  Level of Consciousness: awake and alert   Airway and Oxygen Therapy: Patient Spontanous Breathing and Patient connected to nasal cannula oxygen  Post-op Pain: none  Post-op Assessment: Post-op Vital signs reviewed  Post-op Vital Signs: Reviewed and stable  Last Vitals:  Filed Vitals:   01/23/14 1110  BP: 100/78  Pulse:   Temp:   Resp:     Complications: No apparent anesthesia complications

## 2014-01-23 NOTE — Discharge Instructions (Addendum)
Gastrointestinal Endoscopy, Care After °Refer to this sheet in the next few weeks. These instructions provide you with information on caring for yourself after your procedure. Your caregiver may also give you more specific instructions. Your treatment has been planned according to current medical practices, but problems sometimes occur. Call your caregiver if you have any problems or questions after your procedure. °HOME CARE INSTRUCTIONS °· If you were given medicine to help you relax (sedative), do not drive, operate machinery, or sign important documents for 24 hours. °· Avoid alcohol and hot or warm beverages for the first 24 hours after the procedure. °· Only take over-the-counter or prescription medicines for pain, discomfort, or fever as directed by your caregiver. You may resume taking your normal medicines unless your caregiver tells you otherwise. Ask your caregiver when you may resume taking medicines that may cause bleeding, such as aspirin, clopidogrel, or warfarin. °· You may return to your normal diet and activities on the day after your procedure, or as directed by your caregiver. Walking may help to reduce any bloated feeling in your abdomen. °· Drink enough fluids to keep your urine clear or pale yellow. °· You may gargle with salt water if you have a sore throat. °SEEK IMMEDIATE MEDICAL CARE IF: °· You have severe nausea or vomiting. °· You have severe abdominal pain, abdominal cramps that last longer than 6 hours, or abdominal swelling (distention). °· You have severe shoulder or back pain. °· You have trouble swallowing. °· You have shortness of breath, your breathing is shallow, or you are breathing faster than normal. °· You have a fever or a rapid heartbeat. °· You vomit blood or material that looks like coffee grounds. °· You have bloody, black, or tarry stools. °MAKE SURE YOU: °· Understand these instructions. °· Will watch your condition. °· Will get help right away if you are not doing  well or get worse. °Document Released: 09/16/2003 Document Revised: 06/18/2013 Document Reviewed: 05/04/2011 °ExitCare® Patient Information ©2015 [employer]. This information is not intended to replace advice given to you by your health care provider. Make sure you discuss any questions you have with your health care provider. ° °

## 2014-01-23 NOTE — Anesthesia Procedure Notes (Signed)
Procedure Name: MAC Date/Time: 01/23/2014 10:32 AM Performed by: Eligha Bridegroom Pre-anesthesia Checklist: Patient identified, Emergency Drugs available, Suction available, Patient being monitored and Timeout performed Patient Re-evaluated:Patient Re-evaluated prior to inductionOxygen Delivery Method: Nasal cannula Preoxygenation: Pre-oxygenation with 100% oxygen Intubation Type: IV induction

## 2014-01-23 NOTE — Addendum Note (Signed)
Addended by: Wilford Corner on: 01/23/2014 08:21 AM   Modules accepted: Orders

## 2014-01-23 NOTE — H&P (Signed)
  Date of Initial H&P: 01/18/14  History reviewed, patient examined, no change in status, stable for surgery. EGD with possible banding for eradication of esophageal varices.

## 2014-01-23 NOTE — Anesthesia Preprocedure Evaluation (Addendum)
Anesthesia Evaluation  Patient identified by MRN, date of birth, ID band Patient awake    History of Anesthesia Complications (+) PROLONGED EMERGENCE  Airway        Dental   Pulmonary Current Smoker,          Cardiovascular + CABG + dysrhythmias Atrial Fibrillation     Neuro/Psych    GI/Hepatic GERD-  Medicated,(+) Cirrhosis -      ,   Endo/Other  diabetes, Poorly Controlled, Type 2, Oral Hypoglycemic Agents, Insulin Dependent  Renal/GU      Musculoskeletal   Abdominal   Peds  Hematology  (+) anemia ,   Anesthesia Other Findings   Reproductive/Obstetrics                          Anesthesia Physical Anesthesia Plan  ASA: II  Anesthesia Plan: MAC   Post-op Pain Management:    Induction: Intravenous  Airway Management Planned: Nasal Cannula  Additional Equipment:   Intra-op Plan:   Post-operative Plan:   Informed Consent:   Dental advisory given  Plan Discussed with:   Anesthesia Plan Comments:         Anesthesia Quick Evaluation

## 2014-01-23 NOTE — Transfer of Care (Signed)
Immediate Anesthesia Transfer of Care Note  Patient: Natalie Lane  Procedure(s) Performed: Procedure(s): ESOPHAGOGASTRODUODENOSCOPY (EGD) WITH PROPOFOL (N/A) GASTRIC VARICES BANDING (N/A)  Patient Location: PACU and Endoscopy Unit  Anesthesia Type:MAC  Level of Consciousness: awake, alert  and oriented  Airway & Oxygen Therapy: Patient Spontanous Breathing and Patient connected to nasal cannula oxygen  Post-op Assessment: Report given to PACU RN and Post -op Vital signs reviewed and stable  Post vital signs: Reviewed and stable  Complications: No apparent anesthesia complications

## 2014-01-23 NOTE — Interval H&P Note (Signed)
History and Physical Interval Note:  01/23/2014 9:49 AM  Natalie Lane  has presented today for surgery, with the diagnosis of varicies/cirrhosis  The various methods of treatment have been discussed with the patient and family. After consideration of risks, benefits and other options for treatment, the patient has consented to  Procedure(s): ESOPHAGOGASTRODUODENOSCOPY (EGD) WITH PROPOFOL (N/A) GASTRIC VARICES BANDING (N/A) as a surgical intervention .  The patient's history has been reviewed, patient examined, no change in status, stable for surgery.  I have reviewed the patient's chart and labs.  Questions were answered to the patient's satisfaction.     Torboy C.

## 2014-01-24 ENCOUNTER — Encounter (HOSPITAL_COMMUNITY): Payer: Self-pay | Admitting: Gastroenterology

## 2014-02-18 DIAGNOSIS — K7460 Unspecified cirrhosis of liver: Secondary | ICD-10-CM | POA: Diagnosis not present

## 2014-02-18 DIAGNOSIS — E118 Type 2 diabetes mellitus with unspecified complications: Secondary | ICD-10-CM | POA: Diagnosis not present

## 2014-02-18 DIAGNOSIS — Z880 Allergy status to penicillin: Secondary | ICD-10-CM | POA: Diagnosis not present

## 2014-02-18 DIAGNOSIS — D135 Benign neoplasm of extrahepatic bile ducts: Secondary | ICD-10-CM | POA: Diagnosis not present

## 2014-02-18 DIAGNOSIS — Z882 Allergy status to sulfonamides status: Secondary | ICD-10-CM | POA: Diagnosis not present

## 2014-02-18 DIAGNOSIS — Z720 Tobacco use: Secondary | ICD-10-CM | POA: Diagnosis not present

## 2014-02-18 DIAGNOSIS — Z794 Long term (current) use of insulin: Secondary | ICD-10-CM | POA: Diagnosis not present

## 2014-02-18 DIAGNOSIS — Z86010 Personal history of colonic polyps: Secondary | ICD-10-CM | POA: Diagnosis not present

## 2014-02-18 DIAGNOSIS — D132 Benign neoplasm of duodenum: Secondary | ICD-10-CM | POA: Diagnosis not present

## 2014-02-18 DIAGNOSIS — K3189 Other diseases of stomach and duodenum: Secondary | ICD-10-CM | POA: Diagnosis not present

## 2014-02-18 DIAGNOSIS — K759 Inflammatory liver disease, unspecified: Secondary | ICD-10-CM | POA: Diagnosis not present

## 2014-02-18 DIAGNOSIS — Z91041 Radiographic dye allergy status: Secondary | ICD-10-CM | POA: Diagnosis not present

## 2014-02-28 DIAGNOSIS — E1165 Type 2 diabetes mellitus with hyperglycemia: Secondary | ICD-10-CM | POA: Diagnosis not present

## 2014-02-28 LAB — BASIC METABOLIC PANEL
BUN: 20 mg/dL (ref 4–21)
CREATININE: 0.9 mg/dL (ref 0.5–1.1)
Glucose: 177 mg/dL
Potassium: 5.1 mmol/L (ref 3.4–5.3)
Sodium: 133 mmol/L — AB (ref 137–147)

## 2014-02-28 LAB — LIPID PANEL
Cholesterol: 287 mg/dL — AB (ref 0–200)
HDL: 62 mg/dL (ref 35–70)
LDL CALC: 196 mg/dL
LDl/HDL Ratio: 4.6
Triglycerides: 148 mg/dL (ref 40–160)

## 2014-02-28 LAB — HEMOGLOBIN A1C: Hgb A1c MFr Bld: 10.1 % — AB (ref 4.0–6.0)

## 2014-03-15 ENCOUNTER — Encounter: Payer: Self-pay | Admitting: Internal Medicine

## 2014-03-18 ENCOUNTER — Ambulatory Visit (INDEPENDENT_AMBULATORY_CARE_PROVIDER_SITE_OTHER): Payer: Medicare Other | Admitting: Internal Medicine

## 2014-03-18 ENCOUNTER — Encounter: Payer: Self-pay | Admitting: Internal Medicine

## 2014-03-18 ENCOUNTER — Other Ambulatory Visit: Payer: Self-pay | Admitting: *Deleted

## 2014-03-18 VITALS — BP 114/76 | HR 72 | Temp 97.9°F | Resp 12 | Ht 63.5 in | Wt 167.4 lb

## 2014-03-18 DIAGNOSIS — E1165 Type 2 diabetes mellitus with hyperglycemia: Principal | ICD-10-CM

## 2014-03-18 DIAGNOSIS — E1140 Type 2 diabetes mellitus with diabetic neuropathy, unspecified: Secondary | ICD-10-CM | POA: Diagnosis not present

## 2014-03-18 DIAGNOSIS — E1142 Type 2 diabetes mellitus with diabetic polyneuropathy: Secondary | ICD-10-CM

## 2014-03-18 MED ORDER — EMPAGLIFLOZIN 10 MG PO TABS: 10.0000 mg | ORAL_TABLET | Freq: Every day | ORAL | Status: DC

## 2014-03-18 MED ORDER — METFORMIN HCL ER 500 MG PO TB24: 500.0000 mg | ORAL_TABLET | Freq: Two times a day (BID) | ORAL | Status: DC

## 2014-03-18 MED ORDER — CANAGLIFLOZIN 100 MG PO TABS: 100.0000 mg | ORAL_TABLET | Freq: Every day | ORAL | Status: DC

## 2014-03-18 NOTE — Progress Notes (Signed)
Patient ID: Natalie Lane, female   DOB: 1945-12-24, 69 y.o.   MRN: [id number]  HPI: Natalie Lane is a 69 y.o.-year-old female, referred by her PCP, Dr. Carol Ada, for management of DM2, dx in 2004, insulin-dependent since 2015, uncontrolled, with complications (PN). She has [facility] part A and B. She has [employer] [facility] for drugs.  Last hemoglobin A1c was: Lab Results  Component Value Date   HGBA1C 10.1* 02/28/2014   HGBA1C 7.1* 12/09/2010  11/27/2013: 8.4%  Pt is on a regimen of: - Lantus 40 units qhs - Welchol 3x a week She was on Metformin - taken off as she had cirrhosis. She had diarrhea with regular metformin. She was on Tradjenta 5 mg in am  Pt checks her sugars 1x a day and they are: - am: 160, lower 200s, 220 - 2h after b'fast: n/c - before lunch: n/c - 2h after lunch: n/c - before dinner: n/c - 2h after dinner: 348 x1 - bedtime: n/c - nighttime: n/c No lows. Lowest sugar was 144 (40s x2 in the past - 2005); she has hypoglycemia awareness at 70.  Highest sugar was 348.  Glucometer: One Touch   Pt's meals are: - Breakfast: oatmeal or [place] bacon or sausage + eggs - Lunch: chilli beans; vegetable or hamburger soup - Dinner: salad or bowl of cereals  - no CKD, last BUN/creatinine:  Lab Results  Component Value Date   BUN 20 02/28/2014   CREATININE 0.9 02/28/2014   - last set of lipids: Lab Results  Component Value Date   CHOL 287* 02/28/2014   HDL 62 02/28/2014   LDLCALC 196 02/28/2014   TRIG 148 02/28/2014  On Welchol. Statins >> leg and foot cramps.  - last eye exam was in Summer 2015. No DR.  - + numbness and tingling in her feet.  Pt has FH of DM in GF, brother.  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   She also has a h/o cirrhosis. Latest LFTs reviewed: 10/23/2013: AST/ALT: 24/24  ROS: Constitutional: no weight  gain/loss, + fatigue,+ subjective hyperthermia, + poor sleep; + nocturia Eyes: + blurry vision, no xerophthalmia ENT: no sore throat, no nodules palpated in throat, no dysphagia/odynophagia, no hoarseness Cardiovascular: no CP/SOB/palpitations/+ leg swelling Respiratory: no cough/SOB Gastrointestinal: no N/V/D/C/+ heartburn Musculoskeletal: + both muscle/joint aches Skin: no rashes Neurological: no tremors/numbness/tingling/dizziness, + HA Psychiatric: no depression/anxiety  Past Medical History  Diagnosis Date  . Atrial flutter   . 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   . Edema   . Diabetes mellitus, type 2   . Hyperlipidemia   . Anemia   . Normal stress echocardiogram 2008  . Cirrhosis   . History of blood transfusion   . GERD (gastroesophageal reflux disease)   . Dysrhythmia     AFlutter  . Complication of anesthesia   . PONV (postoperative nausea and vomiting)     with Cholecystectomy   Past Surgical History  Procedure Laterality Date  . Asd versus vsd repair  1978    Operative notes not available.  . Atrial ablation surgery  10 /2011  . Other surgical history      partial hysterectomy  . Cholecystectomy    . Hernia repair    . Abdominal hysterectomy    .  Coronary artery bypass graft  1978  . Back surgery  1986    x2  . Esophagogastroduodenoscopy (egd) with propofol N/A 11/16/2013    Procedure: ESOPHAGOGASTRODUODENOSCOPY (EGD) WITH PROPOFOL;  Surgeon: Lear Ng, MD;  Location: [place];  Service: Endoscopy;  Laterality: N/A;  . Gastric varices banding N/A 11/16/2013    Procedure: GASTRIC VARICES BANDING;  Surgeon: Lear Ng, MD;  Location: [place];  Service: Endoscopy;  Laterality: N/A;  . Esophagogastroduodenoscopy (egd) with propofol N/A 01/23/2014    Procedure: ESOPHAGOGASTRODUODENOSCOPY (EGD) WITH PROPOFOL;  Surgeon: Lear Ng, MD;  Location: [place];   Service: Endoscopy;  Laterality: N/A;  . Gastric varices banding N/A 01/23/2014    Procedure: GASTRIC VARICES BANDING;  Surgeon: Lear Ng, MD;  Location: [place];  Service: Endoscopy;  Laterality: N/A;   History   Social History  . Marital Status: Married    Spouse Name: N/A    Number of Children: 3   Occupational History  . retired    Social History Main Topics  . Smoking status: Current Every Day Smoker -- 0.50 packs/day for 36 years    Types: Cigarettes  . Smokeless tobacco: Never Used  . Alcohol Use: No  . Drug Use: No   Current Outpatient Prescriptions on File Prior to Visit  Medication Sig Dispense Refill  . Carboxymethylcellul-Glycerin (CLEAR EYES FOR DRY EYES OP) Apply 1 drop to eye daily.    . diphenhydrAMINE (BENADRYL) 25 MG tablet Take 25 mg by mouth every 6 (six) hours as needed for allergies.    Mariane Baumgarten Calcium (STOOL SOFTENER PO) Take 2 tablets by mouth daily as needed (for constipation).    . furosemide (LASIX) 40 MG tablet Take 40 mg by mouth daily.    Marland Kitchen glucose blood test strip 1 each by Other route as needed for other. Use to test blood sugar 1 time daily as instructed    . insulin glargine (LANTUS) 100 UNIT/ML injection Inject 40 Units into the skin at bedtime.     . pantoprazole (PROTONIX) 40 MG tablet Take 40 mg by mouth daily.    . propranolol (INDERAL) 20 MG tablet Take 1 tablet (20 mg total) by mouth 2 (two) times daily. 60 tablet 6  . spironolactone (ALDACTONE) 100 MG tablet Take 100 mg by mouth daily.    Marland Kitchen trimethoprim-polymyxin b (POLYTRIM) ophthalmic solution 1 drop every 4 (four) hours.    . Colesevelam HCl 3.75 G PACK Take 1 packet by mouth 3 (three) times a week.    . linagliptin (TRADJENTA) 5 MG TABS tablet Take 5 mg by mouth daily.    Marland Kitchen loperamide (IMODIUM A-D) 2 MG tablet Take 2 mg by mouth 4 (four) times daily as needed for diarrhea or loose stools.    . metoprolol succinate (TOPROL-XL) 50 MG 24 hr tablet Take 50 mg by mouth  daily. Take 1 tablet in the AM and 1/2 tablet in the PM.    . traMADol (ULTRAM) 50 MG tablet Take 50 mg by mouth every 6 (six) hours as needed.     No current facility-administered medications on file prior to visit.   Allergies  Allergen Reactions  . Other Anaphylaxis    MSG  . Red Dye Anaphylaxis  . Erythromycin Other (See Comments)    migraine  . Penicillins Other (See Comments)    headaches  . Statins   . Sulfa Antibiotics Hives  . Iohexol Rash   Family History  Problem Relation Age of  Onset  . Kidney disease Sister   . Depression Paternal Grandfather   . Diabetes Brother   . Hypertension Mother    PE: BP 114/76 mmHg  Pulse 72  Temp(Src) 97.9 F (36.6 C) (Oral)  Resp 12  Ht 5' 3.5" (1.613 m)  Wt 167 lb 6.4 oz (75.932 kg)  BMI 29.18 kg/m2  SpO2 98% Wt Readings from Last 3 Encounters:  03/18/14 167 lb 6.4 oz (75.932 kg)  01/23/14 160 lb (72.576 kg)  11/16/13 151 lb (68.493 kg)   Constitutional: overweight, in NAD Eyes: PERRLA, EOMI, no exophthalmos ENT: moist mucous membranes, no thyromegaly, no cervical lymphadenopathy Cardiovascular: RRR, No MRG Respiratory: CTA B Gastrointestinal: abdomen soft, NT, ND, BS+ Musculoskeletal: no deformities, strength intact in all 4 Skin: moist, warm, no rashes Neurological: no tremor with outstretched hands, DTR normal in all 4  ASSESSMENT: 1. DM2, insulin-dependent, uncontrolled, with complications - PN  PLAN:  1. Patient with long-standing, uncontrolled diabetes, on oral antidiabetic regimen, which became insufficient. She had a recent pancreatic mass resected (and apparently has a stent paced) and also has cirrhosis >> we are a little limited in the med options. Price is also an issue Scientist, water quality).  Will try to add 1/2 target Metformin XR (latest LFTs  - 10/2013 - reviewed >> normal, per records from PCP) an also Invokana.  - We discussed about options for treatment, and I suggested to:  Patient Instructions  Please  schedule an appointment with Antonieta Iba (nutrition).  Please continue Lantus 40 units at bedtime.  Please add metformin XR 500 mg at dinner x 4 days, then add another tablet with breakfast. Continue 500 mg 2x a day with meals.  Please add Invokana 100 mg before breakfast.  Please return in 1 month with your sugar log.   - we discussed about SEs of Invokana, which are: dizziness (advised to be careful when stands from sitting position), decreased BP - usually not < normal (BP today is not low), and fungal UTIs (advised to let me know if develops one).  - given discount card for Invokana - Strongly advised her to start checking sugars at different times of the day - check 1-2 times a day, rotating checks - given sugar log and advised how to fill it and to bring it at next appt  - given foot care handout and explained the principles  - given instructions for hypoglycemia management "15-15 rule"  - advised for yearly eye exams >> she is UTD - at next visit we need a BMP (Invokana and Spironolactone) - Return to clinic in 1 mo with sugar log

## 2014-03-18 NOTE — Patient Instructions (Signed)
Please schedule an appointment with Antonieta Iba (nutrition).  Please continue Lantus 40 units at bedtime.  Please add metformin XR 500 mg at dinner x 4 days, then add another tablet with breakfast. Continue 500 mg 2x a day with meals.  Please add Invokana 100 mg before breakfast.  Please return in 1 month with your sugar log.   PATIENT INSTRUCTIONS FOR TYPE 2 DIABETES:  **Please join MyChart!** - see attached instructions about how to join if you have not done so already.  DIET AND EXERCISE Diet and exercise is an important part of diabetic treatment.  We recommended aerobic exercise in the form of brisk walking (working between 40-60% of maximal aerobic capacity, similar to brisk walking) for 150 minutes per week (such as 30 minutes five days per week) along with 3 times per week performing 'resistance' training (using various gauge rubber tubes with handles) 5-10 exercises involving the major muscle groups (upper body, lower body and core) performing 10-15 repetitions (or near fatigue) each exercise. Start at half the above goal but build slowly to reach the above goals. If limited by weight, joint pain, or disability, we recommend daily walking in a swimming pool with water up to waist to reduce pressure from joints while allow for adequate exercise.    BLOOD GLUCOSES Monitoring your blood glucoses is important for continued management of your diabetes. Please check your blood glucoses 2-4 times a day: fasting, before meals and at bedtime (you can rotate these measurements - e.g. one day check before the 3 meals, the next day check before 2 of the meals and before bedtime, etc.).   HYPOGLYCEMIA (low blood sugar) Hypoglycemia is usually a reaction to not eating, exercising, or taking too much insulin/ other diabetes drugs.  Symptoms include tremors, sweating, hunger, confusion, headache, etc. Treat IMMEDIATELY with 15 grams of Carbs: . 4 glucose tablets .  cup regular juice/soda . 2  tablespoons raisins . 4 teaspoons sugar . 1 tablespoon honey Recheck blood glucose in 15 mins and repeat above if still symptomatic/blood glucose <100.  RECOMMENDATIONS TO REDUCE YOUR RISK OF DIABETIC COMPLICATIONS: * Take your prescribed MEDICATION(S) * Follow a DIABETIC diet: Complex carbs, fiber rich foods, (monounsaturated and polyunsaturated) fats * AVOID saturated/trans fats, high fat foods, >2,300 mg salt per day. * EXERCISE at least 5 times a week for 30 minutes or preferably daily.  * DO NOT SMOKE OR DRINK more than 1 drink a day. * Check your FEET every day. Do not wear tightfitting shoes. Contact us if you develop an ulcer * See your EYE doctor once a year or more if needed * Get a FLU shot once a year * Get a PNEUMONIA vaccine once before and once after age 16 years  GOALS:  * Your Hemoglobin A1c of <7%  * fasting sugars need to be <130 * after meals sugars need to be <180 (2h after you start eating) * Your Systolic BP should be 563 or lower  * Your Diastolic BP should be 80 or lower  * Your HDL (Good Cholesterol) should be 40 or higher  * Your LDL (Bad Cholesterol) should be 100 or lower. * Your Triglycerides should be 150 or lower  * Your Urine microalbumin (kidney function) should be <30 * Your Body Mass Index should be 25 or lower    Please consider the following ways to cut down carbs and fat and increase fiber and micronutrients in your diet: - substitute whole grain for white bread or pasta -  substitute brown rice for white rice - substitute 90-calorie flat bread pieces for slices of bread when possible - substitute sweet potatoes or yams for white potatoes - substitute humus for margarine - substitute tofu for cheese when possible - substitute almond or rice milk for regular milk (would not drink soy milk daily due to concern for soy estrogen influence on breast cancer risk) - substitute dark chocolate for other sweets when possible - substitute water - can  add lemon or orange slices for taste - for diet sodas (artificial sweeteners will trick your body that you can eat sweets without getting calories and will lead you to overeating and weight gain in the long run) - do not skip breakfast or other meals (this will slow down the metabolism and will result in more weight gain over time)  - can try smoothies made from fruit and almond/rice milk in am instead of regular breakfast - can also try old-fashioned (not instant) oatmeal made with almond/rice milk in am - order the dressing on the side when eating salad at a restaurant (pour less than half of the dressing on the salad) - eat as little meat as possible - can try juicing, but should not forget that juicing will get rid of the fiber, so would alternate with eating raw veg./fruits or drinking smoothies - use as little oil as possible, even when using olive oil - can dress a salad with a mix of balsamic vinegar and lemon juice, for e.g. - use agave nectar, stevia sugar, or regular sugar rather than artificial sweateners - steam or broil/roast veggies  - snack on veggies/fruit/nuts (unsalted, preferably) when possible, rather than processed foods - reduce or eliminate aspartame in diet (it is in diet sodas, chewing gum, etc) Read the labels!  Try to read Dr. Janene Harvey book: "Program for Reversing Diabetes" for other ideas for healthy eating.

## 2014-03-21 ENCOUNTER — Encounter: Payer: Medicare Other | Attending: Internal Medicine | Admitting: Dietician

## 2014-03-21 ENCOUNTER — Encounter: Payer: Self-pay | Admitting: Dietician

## 2014-03-21 VITALS — Ht 63.5 in | Wt 167.0 lb

## 2014-03-21 DIAGNOSIS — E1140 Type 2 diabetes mellitus with diabetic neuropathy, unspecified: Secondary | ICD-10-CM | POA: Insufficient documentation

## 2014-03-21 DIAGNOSIS — E1142 Type 2 diabetes mellitus with diabetic polyneuropathy: Secondary | ICD-10-CM

## 2014-03-21 DIAGNOSIS — E1165 Type 2 diabetes mellitus with hyperglycemia: Secondary | ICD-10-CM

## 2014-03-21 DIAGNOSIS — Z794 Long term (current) use of insulin: Secondary | ICD-10-CM | POA: Diagnosis not present

## 2014-03-21 DIAGNOSIS — Z713 Dietary counseling and surveillance: Secondary | ICD-10-CM | POA: Insufficient documentation

## 2014-03-21 NOTE — Progress Notes (Signed)
  Medical Nutrition Therapy:  Appt start time: 1030 end time:  0211.   Assessment:  Primary concerns today: Patient wants help with what to eat and how to prepare foods to better control blood sugar. Patient has had type 2 DM since 2004 and has been on insulin since 2015.  She had a benign pancrease mass removed 1 month ago.  HgbA1C 11/27/13 was 8.4% and has increased to 10.1% 02/28/14.  Patient has been restarted on Metformin.  She had severe diarrhea in the past and is worried about this currently but tolerating well at current dose.  Patient is accompanied by her husband who also has DM.  Preferred Learning Style:   No preference indicated   Learning Readiness:   Ready  Change in progress   MEDICATIONS: see list, insulin, invocana, metformin   DIETARY INTAKE:  Usual eating pattern includes 2-3 meals and 1-2 snacks per day.  Everyday foods include roasted vegetables.  Avoided foods include salt.    24-hr recall:  B (6-8 AM): [employer] toast with hummus, coffee with 1 tsp powdered creamer  Snk ( AM): apple  L ( PM): none (too busy) Snk ( PM): carrots D ( PM): roasted vegetables and breaded shrimp Snk ( PM): granola bar Beverages: water and coffee with dry creamer, occasional pepsi free  Usual physical activity: walks dogs 3 times per day for a short period  Estimated energy needs: 1500 calories 170 g carbohydrates 94 g protein 50 g fat  Progress Towards Goal(s):  In progress.   Nutritional Diagnosis:  NB-1.1 Food and nutrition-related knowledge deficit As related to balance of carbohydrates, protein, and fats.  As evidenced by patient report.    Intervention:  Nutrition counseling and diabetes education initiated. Discussed Carb Counting by food group as method of portion control, reading food labels, and benefits of increased activity.  Also discussed increasing more plant protein's and meal planning related to this.  Discussed the benefits of regular meal schedule.  Low  sodium disscused as well.    Teaching Method Utilized:  Visual Auditory Hands on  Handouts given during visit include:  Arm Chair exercises.  Meal planning card  Vegetarian protein options  Low CHO/protein snack options.  Barriers to learning/adherence to lifestyle change: none  Demonstrated degree of understanding via:  Teach Back   Monitoring/Evaluation:  Dietary intake, exercise, label reading, and body weight prn.

## 2014-04-17 ENCOUNTER — Ambulatory Visit (INDEPENDENT_AMBULATORY_CARE_PROVIDER_SITE_OTHER): Payer: Medicare Other | Admitting: Internal Medicine

## 2014-04-17 ENCOUNTER — Encounter: Payer: Self-pay | Admitting: Internal Medicine

## 2014-04-17 ENCOUNTER — Telehealth: Payer: Self-pay | Admitting: Internal Medicine

## 2014-04-17 VITALS — BP 102/60 | HR 66 | Temp 97.7°F | Resp 12 | Wt 164.8 lb

## 2014-04-17 DIAGNOSIS — E1140 Type 2 diabetes mellitus with diabetic neuropathy, unspecified: Secondary | ICD-10-CM | POA: Diagnosis not present

## 2014-04-17 DIAGNOSIS — E1165 Type 2 diabetes mellitus with hyperglycemia: Principal | ICD-10-CM

## 2014-04-17 DIAGNOSIS — E1142 Type 2 diabetes mellitus with diabetic polyneuropathy: Secondary | ICD-10-CM

## 2014-04-17 LAB — BASIC METABOLIC PANEL
BUN: 30 mg/dL — ABNORMAL HIGH (ref 6–23)
CALCIUM: 9.9 mg/dL (ref 8.4–10.5)
CO2: 26 mEq/L (ref 19–32)
Chloride: 103 mEq/L (ref 96–112)
Creatinine, Ser: 1.02 mg/dL (ref 0.40–1.20)
GFR: 57.36 mL/min — ABNORMAL LOW (ref >60.00)
Glucose, Bld: 117 mg/dL — ABNORMAL HIGH (ref 70–99)
Potassium: 4.7 mEq/L (ref 3.5–5.1)
SODIUM: 137 meq/L (ref 135–145)

## 2014-04-17 NOTE — Telephone Encounter (Signed)
Patient called and was advised to call office with her Rx and dosage   Rx: Jardiance 10mg     Thank you

## 2014-04-17 NOTE — Progress Notes (Signed)
Patient ID: Natalie Lane, female   DOB: [birth year], 69 y.o.   MRN: [id number]  HPI: Natalie Lane is a 69 y.o.-year-old female, returning for f/u for DM2, dx in 2004, insulin-dependent since 2015, uncontrolled, with complications (PN). Last visit 1 mo ago. She has M'care part A and B. She has [employer] [facility] for drugs.  Since last visit, she saw Antonieta Iba (nutrition).  Last hemoglobin A1c was: Lab Results  Component Value Date   HGBA1C 10.1* 02/28/2014   HGBA1C 7.1* 12/09/2010  11/27/2013: 8.4%  Pt is on a regimen of: - Lantus 40 units qhs - Welchol 3x a week - Metformin XR 500 mg 2x a day -added 03/18/2014 - Jardiance 10 mg daily in am -added 03/18/2014 (tried Invokana, not covered). She is also on lasix 40.  She was on Metformin - taken off as she had cirrhosis. She had diarrhea with regular metformin. She was on Tradjenta 5 mg in am  Pt checks her sugars 1x a day and they are: - am: 160, lower 200s, 220 >> 89, 107-160 - 2h after b'fast: n/c >> 201, 207 - before lunch: n/c >> 134-168, 207x1 - 2h after lunch: n/c257 - before dinner: n/c >> 160, 189 - 2h after dinner: 348 x1 >> 137, 194, 206 - bedtime: n/c >> 142, 203, 208 - nighttime: n/c No lows. Lowest sugar was 89x1; she has hypoglycemia awareness at 70.  Highest sugar was 348 >> 257.  Glucometer: One Touch   Pt's meals are: - Breakfast: oatmeal or [place] bacon or sausage + eggs - Lunch: chilli beans; vegetable or hamburger soup - Dinner: salad or bowl of cereals  - no CKD, last BUN/creatinine:  Lab Results  Component Value Date   BUN 20 02/28/2014   CREATININE 0.9 02/28/2014   - last set of lipids: Lab Results  Component Value Date   CHOL 287* 02/28/2014   HDL 62 02/28/2014   LDLCALC 196 02/28/2014   TRIG 148 02/28/2014  On Welchol. Statins >> leg and foot cramps.  - last eye exam was in Summer 2015. No DR.  - + numbness and tingling in her feet.  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   She also has a h/o cirrhosis. Latest LFTs reviewed: 10/23/2013: AST/ALT: 24/24  ROS: Constitutional: no weight gain/loss, no fatigue, + subjective hyperthermia, + poor sleep; no nocturia Eyes: no blurry vision, no xerophthalmia ENT: no sore throat, no nodules palpated in throat, no dysphagia/odynophagia, no hoarseness Cardiovascular: no CP/SOB/palpitations/+ leg swelling Respiratory: no cough/SOB Gastrointestinal: no N/V/D/C/heartburn Musculoskeletal: no muscle/joint aches Skin: no rashes Neurological: no tremors/numbness/tingling/dizziness, + HA  I reviewed pt's medications, allergies, PMH, social hx, family hx, and changes were documented in the history of present illness. Otherwise, unchanged from my initial visit note:  Past Medical History  Diagnosis Date  . Atrial flutter   . 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   . Edema   . Diabetes mellitus, type 2   . Hyperlipidemia   . Anemia   . Normal stress echocardiogram 2008  . Cirrhosis   . History of blood transfusion   . GERD (gastroesophageal reflux disease)   . Dysrhythmia     AFlutter  . Complication of anesthesia   . PONV (postoperative nausea  and vomiting)     with Cholecystectomy   Past Surgical History  Procedure Laterality Date  . Asd versus vsd repair  1978    Operative notes not available.  . Atrial ablation surgery  10 /2011  . Other surgical history      partial hysterectomy  . Cholecystectomy    . Hernia repair    . Abdominal hysterectomy    . Coronary artery bypass graft  1978  . Back surgery  1986    x2  . Esophagogastroduodenoscopy (egd) with propofol N/A 11/16/2013    Procedure: ESOPHAGOGASTRODUODENOSCOPY (EGD) WITH PROPOFOL;  Surgeon: Lear Ng, MD;  Location: [place];  Service: Endoscopy;  Laterality: N/A;   . Gastric varices banding N/A 11/16/2013    Procedure: GASTRIC VARICES BANDING;  Surgeon: Lear Ng, MD;  Location: [place];  Service: Endoscopy;  Laterality: N/A;  . Esophagogastroduodenoscopy (egd) with propofol N/A 01/23/2014    Procedure: ESOPHAGOGASTRODUODENOSCOPY (EGD) WITH PROPOFOL;  Surgeon: Lear Ng, MD;  Location: [place];  Service: Endoscopy;  Laterality: N/A;  . Gastric varices banding N/A 01/23/2014    Procedure: GASTRIC VARICES BANDING;  Surgeon: Lear Ng, MD;  Location: [place];  Service: Endoscopy;  Laterality: N/A;   History   Social History  . Marital Status: Married    Spouse Name: N/A    Number of Children: 3   Occupational History  . retired    Social History Main Topics  . Smoking status: Current Every Day Smoker -- 0.50 packs/day for 36 years    Types: Cigarettes  . Smokeless tobacco: Never Used  . Alcohol Use: No  . Drug Use: No   Current Outpatient Prescriptions on File Prior to Visit  Medication Sig Dispense Refill  . canagliflozin (INVOKANA) 100 MG TABS tablet Take 1 tablet (100 mg total) by mouth daily. 30 tablet 2  . Carboxymethylcellul-Glycerin (CLEAR EYES FOR DRY EYES OP) Apply 1 drop to eye daily.    . Colesevelam HCl 3.75 G PACK Take 1 packet by mouth 3 (three) times a week.    . diphenhydrAMINE (BENADRYL) 25 MG tablet Take 25 mg by mouth every 6 (six) hours as needed for allergies.    Mariane Baumgarten Calcium (STOOL SOFTENER PO) Take 2 tablets by mouth daily as needed (for constipation).    Marland Kitchen empagliflozin (JARDIANCE) 10 MG TABS tablet Take 10 mg by mouth daily. 30 tablet 2  . furosemide (LASIX) 40 MG tablet Take 40 mg by mouth daily.    Marland Kitchen glucose blood test strip 1 each by Other route as needed for other. Use to test blood sugar 1 time daily as instructed    . insulin glargine (LANTUS) 100 UNIT/ML injection Inject 40 Units into the skin at bedtime.     Marland Kitchen linagliptin (TRADJENTA) 5 MG TABS tablet Take 5 mg by  mouth daily.    Marland Kitchen loperamide (IMODIUM A-D) 2 MG tablet Take 2 mg by mouth 4 (four) times daily as needed for diarrhea or loose stools.    . metFORMIN (GLUCOPHAGE XR) 500 MG 24 hr tablet Take 1 tablet (500 mg total) by mouth 2 (two) times daily with a meal. 60 tablet 2  . metoprolol succinate (TOPROL-XL) 50 MG 24 hr tablet Take 50 mg by mouth daily. Take 1 tablet in the AM and 1/2 tablet in the PM.    . propranolol (INDERAL) 20 MG tablet Take 1 tablet (20 mg total) by mouth 2 (two) times daily. 60 tablet 6  .  spironolactone (ALDACTONE) 100 MG tablet Take 100 mg by mouth daily.    . traMADol (ULTRAM) 50 MG tablet Take 50 mg by mouth every 6 (six) hours as needed.    . trimethoprim-polymyxin b (POLYTRIM) ophthalmic solution 1 drop every 4 (four) hours.    . pantoprazole (PROTONIX) 40 MG tablet Take 40 mg by mouth daily.     No current facility-administered medications on file prior to visit.   Allergies  Allergen Reactions  . Other Anaphylaxis    MSG  . Red Dye Anaphylaxis  . Erythromycin Other (See Comments)    migraine  . Penicillins Other (See Comments)    headaches  . Statins   . Sulfa Antibiotics Hives  . Iohexol Rash   Family History  Problem Relation Age of Onset  . Kidney disease Sister   . Depression Paternal Grandfather   . Diabetes Brother   . Hypertension Mother    PE: BP 102/60 mmHg  Pulse 66  Temp(Src) 97.7 F (36.5 C) (Oral)  Resp 12  Wt 164 lb 12.8 oz (74.753 kg)  SpO2 96% Wt Readings from Last 3 Encounters:  04/17/14 164 lb 12.8 oz (74.753 kg)  03/21/14 167 lb (75.751 kg)  03/18/14 167 lb 6.4 oz (75.932 kg)   Constitutional: overweight, in NAD Eyes: PERRLA, EOMI, no exophthalmos ENT: moist mucous membranes, no thyromegaly, no cervical lymphadenopathy Cardiovascular: RRR, No MRG Respiratory: CTA B Gastrointestinal: abdomen soft, NT, ND, BS+ Musculoskeletal: no deformities, strength intact in all 4 Skin: moist, warm, no rashes Neurological: no tremor  with outstretched hands, DTR normal in all 4  ASSESSMENT: 1. DM2, insulin-dependent, uncontrolled, with complications - PN  PLAN:  1. Patient with long-standing, uncontrolled diabetes, on oral antidiabetic regimen, wnow with better control after adding Jardiance and Metformin. - We discussed about options for treatment, and I suggested to:  Patient Instructions  Please continue: - Lantus 40 units at bedtime. - Metformin XR 500 mg 2x a day with meals. - Jardiance 10 mg before breakfast.  Please stop at the lab.  If labs normal >> we can increase Jardiance to 25 mg daily.  Please return in 1.5-2 months with your sugar log.   - continue checking sugars at different times of the day - check 1-2 times a day, rotating checks - advised for yearly eye exams >> she is UTD - at next visit we need a BMP (Invokana and Spironolactone) - if BMP normal >> increase Jardiance to 25. We may need to decrease Lasix then. - Return to clinic in 2 mo with sugar log   Office Visit on 04/17/2014  Component Date Value Ref Range Status  . Sodium 04/17/2014 137  135 - 145 mEq/L Final  . Potassium 04/17/2014 4.7  3.5 - 5.1 mEq/L Final  . Chloride 04/17/2014 103  96 - 112 mEq/L Final  . CO2 04/17/2014 26  19 - 32 mEq/L Final  . Glucose, Bld 04/17/2014 117* 70 - 99 mg/dL Final  . BUN 04/17/2014 30* 6 - 23 mg/dL Final  . Creatinine, Ser 04/17/2014 1.02  0.40 - 1.20 mg/dL Final  . Calcium 04/17/2014 9.9  8.4 - 10.5 mg/dL Final  . GFR 04/17/2014 57.36* >60.00 mL/min Final   Potassium is not high, however, BUN and creatinine are a little higher. I will advise the patient to stay very well-hydrated and will repeat the labs at next visit. She is only taking 10 mg of Jardiance now, and I would like to increase this to 25, but  in the meantime, I will advise her to decrease her Lasix to 20 mg daily, since Jardiance also has a potent diuretic effect.

## 2014-04-17 NOTE — Patient Instructions (Signed)
Please continue: - Lantus 40 units at bedtime. - Metformin XR 500 mg 2x a day with meals. - Jardiance 10 mg before breakfast.  Please stop at the lab.  If labs normal >> we can increase Jardiance to 25 mg daily.  Please return in 1.5-2 months with your sugar log.

## 2014-04-18 MED ORDER — EMPAGLIFLOZIN 25 MG PO TABS: 25.0000 mg | ORAL_TABLET | Freq: Every day | ORAL | Status: DC

## 2014-04-22 DIAGNOSIS — Z79899 Other long term (current) drug therapy: Secondary | ICD-10-CM | POA: Diagnosis not present

## 2014-04-22 DIAGNOSIS — Z91041 Radiographic dye allergy status: Secondary | ICD-10-CM | POA: Diagnosis not present

## 2014-04-22 DIAGNOSIS — D135 Benign neoplasm of extrahepatic bile ducts: Secondary | ICD-10-CM | POA: Diagnosis not present

## 2014-04-22 DIAGNOSIS — I8500 Esophageal varices without bleeding: Secondary | ICD-10-CM | POA: Diagnosis not present

## 2014-04-22 DIAGNOSIS — F17200 Nicotine dependence, unspecified, uncomplicated: Secondary | ICD-10-CM | POA: Diagnosis not present

## 2014-04-22 DIAGNOSIS — Z794 Long term (current) use of insulin: Secondary | ICD-10-CM | POA: Diagnosis not present

## 2014-04-22 DIAGNOSIS — Z9089 Acquired absence of other organs: Secondary | ICD-10-CM | POA: Diagnosis not present

## 2014-04-22 DIAGNOSIS — E119 Type 2 diabetes mellitus without complications: Secondary | ICD-10-CM | POA: Diagnosis not present

## 2014-04-22 DIAGNOSIS — Z882 Allergy status to sulfonamides status: Secondary | ICD-10-CM | POA: Diagnosis not present

## 2014-04-22 DIAGNOSIS — Z48815 Encounter for surgical aftercare following surgery on the digestive system: Secondary | ICD-10-CM | POA: Diagnosis not present

## 2014-04-22 DIAGNOSIS — K7460 Unspecified cirrhosis of liver: Secondary | ICD-10-CM | POA: Diagnosis not present

## 2014-04-22 DIAGNOSIS — Z86010 Personal history of colonic polyps: Secondary | ICD-10-CM | POA: Diagnosis not present

## 2014-04-22 DIAGNOSIS — Z91048 Other nonmedicinal substance allergy status: Secondary | ICD-10-CM | POA: Diagnosis not present

## 2014-06-11 ENCOUNTER — Ambulatory Visit: Payer: Medicare Other | Admitting: Nurse Practitioner

## 2014-06-11 ENCOUNTER — Telehealth: Payer: Self-pay

## 2014-06-11 MED ORDER — PROPRANOLOL HCL 20 MG PO TABS: 20.0000 mg | ORAL_TABLET | Freq: Two times a day (BID) | ORAL | Status: DC

## 2014-06-11 NOTE — Telephone Encounter (Signed)
Returned call to patient Dr.Jordan advised Propranolol preferred due to cirrhosis.Refill sent to pharmacy.

## 2014-06-11 NOTE — Telephone Encounter (Signed)
Propranolol is preferred due to her cirrhosis as per GI recommendations.  Marshell Rieger [place] MD, [facility]

## 2014-06-11 NOTE — Addendum Note (Signed)
Addended by: Golden Hurter D on: 06/11/2014 06:20 PM   Modules accepted: Orders, Medications

## 2014-06-14 ENCOUNTER — Other Ambulatory Visit: Payer: Self-pay | Admitting: Internal Medicine

## 2014-06-19 ENCOUNTER — Encounter: Payer: Self-pay | Admitting: Internal Medicine

## 2014-06-19 ENCOUNTER — Ambulatory Visit (INDEPENDENT_AMBULATORY_CARE_PROVIDER_SITE_OTHER): Payer: Medicare Other | Admitting: Internal Medicine

## 2014-06-19 VITALS — BP 110/64 | HR 79 | Temp 98.1°F | Resp 12 | Wt 170.0 lb

## 2014-06-19 DIAGNOSIS — E1165 Type 2 diabetes mellitus with hyperglycemia: Principal | ICD-10-CM

## 2014-06-19 DIAGNOSIS — E1140 Type 2 diabetes mellitus with diabetic neuropathy, unspecified: Secondary | ICD-10-CM | POA: Diagnosis not present

## 2014-06-19 DIAGNOSIS — E1142 Type 2 diabetes mellitus with diabetic polyneuropathy: Secondary | ICD-10-CM

## 2014-06-19 LAB — HEMOGLOBIN A1C: Hgb A1c MFr Bld: 8 % — ABNORMAL HIGH (ref 4.6–6.5)

## 2014-06-19 LAB — BASIC METABOLIC PANEL
BUN: 21 mg/dL (ref 6–23)
CO2: 28 mEq/L (ref 19–32)
Calcium: 10.1 mg/dL (ref 8.4–10.5)
Chloride: 101 mEq/L (ref 96–112)
Creatinine, Ser: 1.05 mg/dL (ref 0.40–1.20)
GFR: 55.44 mL/min — ABNORMAL LOW (ref >60.00)
Glucose, Bld: 252 mg/dL — ABNORMAL HIGH (ref 70–99)
Potassium: 4.9 mEq/L (ref 3.5–5.1)
Sodium: 134 mEq/L — ABNORMAL LOW (ref 135–145)

## 2014-06-19 MED ORDER — GLIPIZIDE ER 5 MG PO TB24: 5.0000 mg | ORAL_TABLET | Freq: Every day | ORAL | Status: DC

## 2014-06-19 NOTE — Progress Notes (Signed)
Patient ID: EMYA PICADO, female   DOB: February 24, 1945, 69 y.o.   MRN: [id number]  HPI: Natalie Lane is a 69 y.o.-year-old female, returning for f/u for DM2, dx in 2004, insulin-dependent since 2015, uncontrolled, with complications (PN). Last visit 1 mo ago. She has M'care part A and B. She has [employer] [facility] for drugs.  Lately has had dietary indiscretions.  Last hemoglobin A1c was: Lab Results  Component Value Date   HGBA1C 10.1* 02/28/2014   HGBA1C 7.1* 12/09/2010  11/27/2013: 8.4%  Pt is on a regimen of: - Lantus 40 units qhs - Metformin XR 500 mg 2x a day -added 03/18/2014 - Jardiance 10 >> 25 mg daily in am -added 03/18/2014 (tried Invokana, not covered).  She is also on lasix 40 >> 20.  She was on Metformin - taken off as she had cirrhosis. She had diarrhea with regular metformin. She was on Tradjenta 5 mg in am She was on Welchol 3x a week >> not taking it frequently  Pt checks her sugars 1-2x a day and they are: - am: 160, lower 200s, 220 >> 89, 107-160 >> 98-198 - 2h after b'fast: n/c >> 201, 207 >> n/c - before lunch: n/c >> 134-168, 207x1 >> n/c - 2h after lunch: n/c >> 257 >> n/c - before dinner: n/c >> 160, 189 >> 110-168 - 2h after dinner: 348 x1 >> 137, 194, 206 >> n/c - bedtime: n/c >> 142, 203, 208 >> 145-202 - nighttime: n/c No lows. Lowest sugar was 98; she has hypoglycemia awareness at 70.  Highest sugar was 348 >> 257 >> 202.  Glucometer: One Touch   Pt's meals are: - Breakfast: oatmeal or [place] bacon or sausage + eggs - Lunch: chilli beans; vegetable or hamburger soup - Dinner: salad or bowl of cereals  She saw Antonieta Iba (nutrition). - no CKD, last BUN/creatinine:  Lab Results  Component Value Date   BUN 30* 04/17/2014   CREATININE 1.02 04/17/2014   - last set of lipids: Lab Results  Component Value Date   CHOL 287* 02/28/2014   HDL 62 02/28/2014   LDLCALC 196 02/28/2014   TRIG 148 02/28/2014  On Welchol. Statins >> leg and foot  cramps.  - last eye exam was in Summer 2015. No DR.  - + numbness and tingling in her feet.  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 Latest LFTs reviewed: 10/23/2013: AST/ALT: 24/24  ROS: Constitutional: + weight gain, + fatigue, no subjective hyperthermia, + poor sleep; no nocturia Eyes: no blurry vision, no xerophthalmia ENT: no sore throat, no nodules palpated in throat, no dysphagia/odynophagia, no hoarseness Cardiovascular: no CP/SOB/palpitations/leg swelling Respiratory: no cough/SOB Gastrointestinal: no N/V/D/C/heartburn Musculoskeletal: no muscle/joint aches Skin: + rash Neurological: no tremors/numbness/tingling/dizziness, no HA  I reviewed pt's medications, allergies, PMH, social hx, family hx, and changes were documented in the history of present illness. Otherwise, unchanged from my initial visit note:  Past Medical History  Diagnosis Date  . Atrial flutter   . 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   . Edema   . Diabetes mellitus, type 2   . Hyperlipidemia   . Anemia   . Normal stress echocardiogram 2008  .  Cirrhosis   . History of blood transfusion   . GERD (gastroesophageal reflux disease)   . Dysrhythmia     AFlutter  . Complication of anesthesia   . PONV (postoperative nausea and vomiting)     with Cholecystectomy   Past Surgical History  Procedure Laterality Date  . Asd versus vsd repair  1978    Operative notes not available.  . Atrial ablation surgery  10 /2011  . Other surgical history      partial hysterectomy  . Cholecystectomy    . Hernia repair    . Abdominal hysterectomy    . Coronary artery bypass graft  1978  . Back surgery  1986    x2  . Esophagogastroduodenoscopy (egd) with propofol N/A  11/16/2013    Procedure: ESOPHAGOGASTRODUODENOSCOPY (EGD) WITH PROPOFOL;  Surgeon: Lear Ng, MD;  Location: [place];  Service: Endoscopy;  Laterality: N/A;  . Gastric varices banding N/A 11/16/2013    Procedure: GASTRIC VARICES BANDING;  Surgeon: Lear Ng, MD;  Location: [place];  Service: Endoscopy;  Laterality: N/A;  . Esophagogastroduodenoscopy (egd) with propofol N/A 01/23/2014    Procedure: ESOPHAGOGASTRODUODENOSCOPY (EGD) WITH PROPOFOL;  Surgeon: Lear Ng, MD;  Location: [place];  Service: Endoscopy;  Laterality: N/A;  . Gastric varices banding N/A 01/23/2014    Procedure: GASTRIC VARICES BANDING;  Surgeon: Lear Ng, MD;  Location: [place];  Service: Endoscopy;  Laterality: N/A;   History   Social History  . Marital Status: Married    Spouse Name: N/A    Number of Children: 3   Occupational History  . retired    Social History Main Topics  . Smoking status: Current Every Day Smoker -- 0.50 packs/day for 36 years    Types: Cigarettes  . Smokeless tobacco: Never Used  . Alcohol Use: No  . Drug Use: No   Current Outpatient Prescriptions on File Prior to Visit  Medication Sig Dispense Refill  . Carboxymethylcellul-Glycerin (CLEAR EYES FOR DRY EYES OP) Apply 1 drop to eye daily.    . Colesevelam HCl 3.75 G PACK Take 1 packet by mouth 3 (three) times a week.    . diphenhydrAMINE (BENADRYL) 25 MG tablet Take 25 mg by mouth every 6 (six) hours as needed for allergies.    Mariane Baumgarten Calcium (STOOL SOFTENER PO) Take 2 tablets by mouth daily as needed (for constipation).    Marland Kitchen empagliflozin (JARDIANCE) 25 MG TABS tablet Take 25 mg by mouth daily. 30 tablet 2  . furosemide (LASIX) 40 MG tablet Take 20 mg by mouth daily.    Marland Kitchen glucose blood test strip 1 each by Other route as needed for other. Use to test blood sugar 1 time daily as instructed    . insulin glargine (LANTUS) 100 UNIT/ML injection Inject 40 Units into the skin at  bedtime.     Marland Kitchen loperamide (IMODIUM A-D) 2 MG tablet Take 2 mg by mouth 4 (four) times daily as needed for diarrhea or loose stools.    . metFORMIN (GLUCOPHAGE-XR) 500 MG 24 hr tablet TAKE ONE TABLET BY MOUTH TWICE DAILY WITH MEALS 60 tablet 0  . pantoprazole (PROTONIX) 40 MG tablet Take 40 mg by mouth daily.    . propranolol (INDERAL) 20 MG tablet Take 1 tablet (20 mg total) by mouth 2 (two) times daily. 60 tablet 6  . spironolactone (ALDACTONE) 100 MG tablet Take 100 mg by mouth daily.    . traMADol (ULTRAM) 50 MG tablet Take 50  mg by mouth every 6 (six) hours as needed.    . trimethoprim-polymyxin b (POLYTRIM) ophthalmic solution 1 drop every 4 (four) hours.     No current facility-administered medications on file prior to visit.   Allergies  Allergen Reactions  . Other Anaphylaxis    MSG  . Red Dye Anaphylaxis  . Erythromycin Other (See Comments)    migraine  . Penicillins Other (See Comments)    headaches  . Statins   . Sulfa Antibiotics Hives  . Iohexol Rash   Family History  Problem Relation Age of Onset  . Kidney disease Sister   . Depression Paternal Grandfather   . Diabetes Brother   . Hypertension Mother    PE: BP 110/64 mmHg  Pulse 79  Temp(Src) 98.1 F (36.7 C) (Oral)  Resp 12  Wt 170 lb (77.111 kg)  SpO2 97% Wt Readings from Last 3 Encounters:  06/19/14 170 lb (77.111 kg)  04/17/14 164 lb 12.8 oz (74.753 kg)  03/21/14 167 lb (75.751 kg)   Constitutional: overweight, in NAD Eyes: PERRLA, EOMI, no exophthalmos ENT: moist mucous membranes, no thyromegaly, no cervical lymphadenopathy Cardiovascular: RRR, No MRG Respiratory: CTA B Gastrointestinal: abdomen soft, NT, ND, BS+ Musculoskeletal: no deformities, strength intact in all 4 Skin: moist, warm, no rashes Neurological: no tremor with outstretched hands, DTR normal in all 4  ASSESSMENT: 1. DM2, insulin-dependent, uncontrolled, with complications - PN  PLAN:  1. Patient with long-standing,  uncontrolled diabetes, on oral antidiabetic regimen, with sugars still high due to recent dietary indiscretions. She will start switching to a healthier diet next week, when her guests leave. - We discussed about options for treatment, and I suggested to add Glipizide XL 5 mg daily to help with her higher sugars later in the day. I anticipate we can stop this when she starts controlling her diet more.  Patient Instructions  Please continue: - Lantus 40 units at bedtime. - Metformin XR 500 mg 2x a day with meals. - Jardiance 25 mg before breakfast.  Please add Glipizide XL 5 mg daily in am.  Please stop at the lab.  Please return in 3 months with your sugar log.   - continue checking sugars at different times of the day - check 1-2 times a day, rotating checks - advised for yearly eye exams >> she is UTD - will check a BMP and HbA1c - Return to clinic in 3 mo with sugar log   Office Visit on 06/19/2014  Component Date Value Ref Range Status  . Sodium 06/19/2014 134* 135 - 145 mEq/L Final  . Potassium 06/19/2014 4.9  3.5 - 5.1 mEq/L Final  . Chloride 06/19/2014 101  96 - 112 mEq/L Final  . CO2 06/19/2014 28  19 - 32 mEq/L Final  . Glucose, Bld 06/19/2014 252* 70 - 99 mg/dL Final  . BUN 06/19/2014 21  6 - 23 mg/dL Final  . Creatinine, Ser 06/19/2014 1.05  0.40 - 1.20 mg/dL Final  . Calcium 06/19/2014 10.1  8.4 - 10.5 mg/dL Final  . GFR 06/19/2014 55.44* >60.00 mL/min Final  . Hgb A1c MFr Bld 06/19/2014 8.0* 4.6 - 6.5 % Final   Glycemic Control Guidelines for People with Diabetes:Non Diabetic:  <6%Goal of Therapy: <7%Additional Action Suggested:  >8%     Hemoglobin A1c definitely improved, despite glucose today being high. Her kidney function is still low, but stable. We'll continue to monitor.

## 2014-06-19 NOTE — Patient Instructions (Signed)
Please continue: - Lantus 40 units at bedtime. - Metformin XR 500 mg 2x a day with meals. - Jardiance 25 mg before breakfast.  Please add Glipizide XL 5 mg daily in am.  Please stop at the lab.  Please return in 3 months with your sugar log.

## 2014-07-13 ENCOUNTER — Telehealth: Payer: Self-pay | Admitting: Internal Medicine

## 2014-07-17 DIAGNOSIS — L821 Other seborrheic keratosis: Secondary | ICD-10-CM | POA: Diagnosis not present

## 2014-07-17 DIAGNOSIS — L570 Actinic keratosis: Secondary | ICD-10-CM | POA: Diagnosis not present

## 2014-07-17 DIAGNOSIS — L719 Rosacea, unspecified: Secondary | ICD-10-CM | POA: Diagnosis not present

## 2014-07-17 NOTE — Telephone Encounter (Signed)
Done

## 2014-07-17 NOTE — Telephone Encounter (Signed)
Patient need a refill of her metformin, send to [facility]  on [address].

## 2014-07-26 ENCOUNTER — Other Ambulatory Visit: Payer: Self-pay | Admitting: Internal Medicine

## 2014-07-30 ENCOUNTER — Ambulatory Visit (INDEPENDENT_AMBULATORY_CARE_PROVIDER_SITE_OTHER): Payer: Medicare Other | Admitting: Cardiology

## 2014-07-30 ENCOUNTER — Other Ambulatory Visit: Payer: Self-pay | Admitting: *Deleted

## 2014-07-30 ENCOUNTER — Encounter: Payer: Self-pay | Admitting: Cardiology

## 2014-07-30 VITALS — BP 132/74 | HR 74 | Ht 63.0 in | Wt 173.0 lb

## 2014-07-30 DIAGNOSIS — K7460 Unspecified cirrhosis of liver: Secondary | ICD-10-CM | POA: Diagnosis not present

## 2014-07-30 DIAGNOSIS — I483 Typical atrial flutter: Secondary | ICD-10-CM | POA: Diagnosis not present

## 2014-07-30 DIAGNOSIS — Q249 Congenital malformation of heart, unspecified: Secondary | ICD-10-CM | POA: Diagnosis not present

## 2014-07-30 DIAGNOSIS — R188 Other ascites: Secondary | ICD-10-CM

## 2014-07-30 MED ORDER — EMPAGLIFLOZIN 25 MG PO TABS: 25.0000 mg | ORAL_TABLET | Freq: Every day | ORAL | Status: DC

## 2014-07-30 NOTE — Patient Instructions (Signed)
Continue your current therapy  I will see you in one year   

## 2014-07-30 NOTE — Telephone Encounter (Signed)
[facility] says rx did not go through. Resending.

## 2014-07-30 NOTE — Progress Notes (Signed)
Natalie Lane Date of Birth: 1946-01-19 Medical Record #[id number]  History of Present Illness: Mrs. Robertshaw is seen today for followup of atrial flutter.  She has a remote history of atrial flutter. 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 She had a normal stress Echo in the past. Echo in 2013 showed no shunt and normal LV/RV function. On follow up today she states she is doing very well from a cardiac standpoint. 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 She is s/p banding. Due to her varices she was switched from metoprolol to propranolol. Initially she states she became very weak on this and tired. This has improved but she still liked the metoprolol better. 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 This was benign. She notes some swelling in her abdomen. She is on aldactone and lasix. She has very infrequent symptoms of flutter that do not last long.    Current Outpatient Prescriptions on File Prior to Visit  Medication Sig Dispense Refill  . Carboxymethylcellul-Glycerin (CLEAR EYES FOR DRY EYES OP) Apply 1 drop to eye daily.    . Colesevelam HCl 3.75 G PACK Take 1 packet by mouth 3 (three) times a week.    . diphenhydrAMINE (BENADRYL) 25 MG tablet Take 25 mg by mouth every 6 (six) hours as needed for allergies.    Mariane Baumgarten Calcium (STOOL SOFTENER PO) Take 2 tablets by mouth daily as needed (for constipation).    . furosemide (LASIX) 40 MG tablet Take 20 mg by mouth daily.    Marland Kitchen glipiZIDE (GLIPIZIDE XL) 5 MG 24 hr tablet Take 1 tablet (5 mg total) by mouth daily with breakfast. 30 tablet 2  . glucose blood test strip 1 each by Other route as needed for other. Use to test blood sugar 1 time daily as instructed    . insulin glargine (LANTUS) 100 UNIT/ML injection Inject 40 Units into the skin at bedtime.     Marland Kitchen JARDIANCE 25 MG TABS tablet TAKE ONE TABLET BY MOUTH ONCE DAILY 30 tablet 1  . loperamide (IMODIUM A-D) 2 MG tablet Take  2 mg by mouth 4 (four) times daily as needed for diarrhea or loose stools.    . metFORMIN (GLUCOPHAGE-XR) 500 MG 24 hr tablet TAKE ONE TABLET BY MOUTH TWICE DAILY WITH MEALS 60 tablet 2  . pantoprazole (PROTONIX) 40 MG tablet Take 40 mg by mouth daily.    . propranolol (INDERAL) 20 MG tablet Take 1 tablet (20 mg total) by mouth 2 (two) times daily. 60 tablet 6  . spironolactone (ALDACTONE) 100 MG tablet Take 100 mg by mouth daily.    . traMADol (ULTRAM) 50 MG tablet Take 50 mg by mouth every 6 (six) hours as needed.    . trimethoprim-polymyxin b (POLYTRIM) ophthalmic solution 1 drop every 4 (four) hours.     No current facility-administered medications on file prior to visit.    Allergies  Allergen Reactions  . Other Anaphylaxis    MSG  . Red Dye Anaphylaxis  . Erythromycin Other (See Comments)    migraine  . Penicillins Other (See Comments)    headaches  . Statins   . Sulfa Antibiotics Hives  . Iohexol Rash    Past Medical History  Diagnosis Date  . Atrial flutter   . 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   . Edema   .  Diabetes mellitus, type 2   . Hyperlipidemia   . Anemia   . Normal stress echocardiogram 2008  . Cirrhosis   . History of blood transfusion   . GERD (gastroesophageal reflux disease)   . Dysrhythmia     AFlutter  . Complication of anesthesia   . PONV (postoperative nausea and vomiting)     with Cholecystectomy    Past Surgical History  Procedure Laterality Date  . Asd versus vsd repair  1978    Operative notes not available.  . Atrial ablation surgery  10 /2011  . Other surgical history      partial hysterectomy  . Cholecystectomy    . Hernia repair    . Abdominal hysterectomy    . Coronary artery bypass graft  1978  . Back surgery  1986    x2  . Esophagogastroduodenoscopy (egd) with propofol N/A 11/16/2013    Procedure: ESOPHAGOGASTRODUODENOSCOPY (EGD) WITH PROPOFOL;  Surgeon:  Lear Ng, MD;  Location: [place];  Service: Endoscopy;  Laterality: N/A;  . Gastric varices banding N/A 11/16/2013    Procedure: GASTRIC VARICES BANDING;  Surgeon: Lear Ng, MD;  Location: [facility];  Service: Endoscopy;  Laterality: N/A;  . Esophagogastroduodenoscopy (egd) with propofol N/A 01/23/2014    Procedure: ESOPHAGOGASTRODUODENOSCOPY (EGD) WITH PROPOFOL;  Surgeon: Lear Ng, MD;  Location: [place];  Service: Endoscopy;  Laterality: N/A;  . Gastric varices banding N/A 01/23/2014    Procedure: GASTRIC VARICES BANDING;  Surgeon: Lear Ng, MD;  Location: [place];  Service: Endoscopy;  Laterality: N/A;    History  Smoking status  . Current Every Day Smoker -- 0.50 packs/day for 36 years  . Types: Cigarettes  Smokeless tobacco  . Never Used    History  Alcohol Use No    Family History  Problem Relation Age of Onset  . Kidney disease Sister   . Depression Paternal Grandfather   . Diabetes Brother   . Hypertension Mother     Review of Systems: The review of systems is per the HPI.  All other systems were reviewed and are negative.  Physical Exam: BP 132/74 mmHg  Pulse 74  Ht 5\' 3"  (1.6 m)  Wt 78.472 kg (173 lb)  BMI 30.65 kg/m2 Patient is very pleasant and in no acute distress. She is obese. Skin is warm and dry. Color is normal.  HEENT is unremarkable. Normocephalic/atraumatic. PERRL. Sclera are nonicteric. Neck is supple. No masses. No JVD. Lungs are clear. Cardiac exam shows a regular rate and rhythm. She has a soft 1/6 systolic murmur at the right and left upper sternal border.. Abdomen is soft, NT. Extremities are without significant edema today. Gait and ROM are intact. No gross neurologic deficits noted.   LABORATORY DATA:  Ecg: NSR with low voltage. T wave inversion consistent with anterior ischemia. No change from 08/31/11. I have personally reviewed and interpreted this study.  Assessment / Plan: 1.  Remote VSD repair. No shunt by last Echo. Asymptomatic.  2. Remote history of atrial flutter. Resolved. No sustained arrhythmia. She is not a candidate for anticoagulation due to cirrhosis and gastric varices.   3. Cirrhosis with gastric varices.   4. Pancreatic ampulla adenoma.   I will follow up in one year.

## 2014-07-30 NOTE — Addendum Note (Signed)
Addended by: Venetia Maxon on: 07/30/2014 10:31 AM   Modules accepted: Orders

## 2014-07-31 NOTE — Addendum Note (Signed)
Addended by: Venetia Maxon on: 07/31/2014 02:41 PM   Modules accepted: Orders

## 2014-09-19 ENCOUNTER — Other Ambulatory Visit (INDEPENDENT_AMBULATORY_CARE_PROVIDER_SITE_OTHER): Payer: Medicare Other | Admitting: *Deleted

## 2014-09-19 ENCOUNTER — Encounter: Payer: Self-pay | Admitting: Internal Medicine

## 2014-09-19 ENCOUNTER — Ambulatory Visit (INDEPENDENT_AMBULATORY_CARE_PROVIDER_SITE_OTHER): Payer: Medicare Other | Admitting: Internal Medicine

## 2014-09-19 VITALS — BP 118/62 | Temp 98.4°F | Resp 12 | Wt 176.6 lb

## 2014-09-19 DIAGNOSIS — E1142 Type 2 diabetes mellitus with diabetic polyneuropathy: Secondary | ICD-10-CM

## 2014-09-19 DIAGNOSIS — E1140 Type 2 diabetes mellitus with diabetic neuropathy, unspecified: Secondary | ICD-10-CM

## 2014-09-19 DIAGNOSIS — E1165 Type 2 diabetes mellitus with hyperglycemia: Principal | ICD-10-CM

## 2014-09-19 LAB — POCT GLYCOSYLATED HEMOGLOBIN (HGB A1C): Hemoglobin A1C: 7.4

## 2014-09-19 NOTE — Patient Instructions (Addendum)
Please continue: - Lantus 40 units at bedtime. If sugars in am are still >130, you can try to increase the Lantus to 45 units  - Metformin XR 500 mg 2x a day with meals. - Jardiance 25 mg before breakfast.  Please let me know when you are close to running out of Lantus so we can send NPH insulin. Let me know at that time how your sugars are doing.  Please return in 3 months with your sugar log.

## 2014-09-19 NOTE — Progress Notes (Signed)
Patient ID: Natalie Lane, female   DOB: 04-Apr-1945, 69 y.o.   MRN: [id number]  HPI: Natalie Lane is a 69 y.o.-year-old female, returning for f/u for DM2, dx in 2004, insulin-dependent since 2015, uncontrolled, with complications (PN). Last visit 3 mo ago. She has M'care part A and B. She has [facility] for drugs.  At last visit, we started Glipizide XL >> rash, palpitations >> she had to stop it after a week.  Last hemoglobin A1c was: Lab Results  Component Value Date   HGBA1C 8.0* 06/19/2014   HGBA1C 10.1* 02/28/2014   HGBA1C 7.1* 12/09/2010  11/27/2013: 8.4%  Pt is on a regimen of: - Lantus 40 units qhs - Metformin XR 500 mg 2x a day -added 03/18/2014 - Jardiance 10 >> 25 mg daily in am -added 03/18/2014 (tried Invokana, not covered).  She is also on lasix 40 >> 20.  She was on Metformin - taken off as she had cirrhosis. She had diarrhea with regular metformin. She was on Tradjenta 5 mg in am She was on Welchol 3x a week >> not taking it frequently  Pt checks her sugars 1-2x a day and they are (per log): - am: 160, lower 200s, 220 >> 89, 107-160 >> 98-198 >> 96, 125-150s, 180, 196 - 2h after b'fast: n/c >> 201, 207 >> n/c >> 140 - before lunch: n/c >> 134-168, 207x1 >> n/c >> 145 - 2h after lunch: n/c >> 257 >> n/c - before dinner: n/c >> 160, 189 >> 110-168 >> n/c - 2h after dinner: 348 x1 >> 137, 194, 206 >> n/c - bedtime: n/c >> 142, 203, 208 >> 145-202 >> n/c - nighttime: n/c No lows. Lowest sugar was 98; she has hypoglycemia awareness at 70.  Highest sugar was 348 >> 257 >> 202 >> 198.  Glucometer: One Touch   Pt's meals are: - Breakfast: oatmeal or [place] bacon or sausage + eggs - Lunch: chilli beans; vegetable or hamburger soup - Dinner: salad or bowl of cereals  She saw Antonieta Iba (nutrition).  - no CKD, last BUN/creatinine:  Lab Results  Component Value Date   BUN 21 06/19/2014   CREATININE 1.05 06/19/2014   - last set of lipids: Lab Results   Component Value Date   CHOL 287* 02/28/2014   HDL 62 02/28/2014   LDLCALC 196 02/28/2014   TRIG 148 02/28/2014  On Welchol. Statins >> leg and foot cramps.  - last eye exam was in Summer 2015. No DR.  - + numbness and tingling in her feet.  She had a mass removed from the pancreas (Dr Amalia Hailey at [facility]) >> she was called that it was benign but cannot give details >> she went back in 04/2014 >> removed again (larger) by EGD   She also has a h/o cirrhosis. Latest LFTs reviewed: 10/23/2013: AST/ALT: 24/24  ROS: Constitutional: + weight gain, + fatigue, no subjective hyperthermia, + poor sleep; no nocturia Eyes: no blurry vision, no xerophthalmia ENT: no sore throat, no nodules palpated in throat, no dysphagia/odynophagia, no hoarseness Cardiovascular: no CP/SOB/+ palpitations - resolved/leg swelling Respiratory: no cough/SOB Gastrointestinal: + N/V/D/C/heartburn Musculoskeletal: + muscle aches/ no joint aches Skin: + rash - resolved Neurological: no tremors/numbness/tingling/dizziness, no HA  I reviewed pt's medications, allergies, PMH, social hx, family hx, and changes were documented in the history of present illness. Otherwise, unchanged from my initial visit note:  Past Medical History  Diagnosis Date  . Atrial flutter   . CHD (  congenital heart disease)     with previous ASD versus VSD repair; old op notes not available.   . Edema   . Diabetes mellitus, type 2   . Hyperlipidemia   . Anemia   . Normal stress echocardiogram 2008  . Cirrhosis   . History of blood transfusion   . GERD (gastroesophageal reflux disease)   . Dysrhythmia     AFlutter  . Complication of anesthesia   . PONV (postoperative nausea and vomiting)     with Cholecystectomy   Past Surgical History  Procedure Laterality Date  . Asd versus vsd repair  1978    Operative notes not available.  . Atrial ablation surgery  10 /2011  . Other surgical history      partial hysterectomy  .  Cholecystectomy    . Hernia repair    . Abdominal hysterectomy    . Coronary artery bypass graft  1978  . Back surgery  1986    x2  . Esophagogastroduodenoscopy (egd) with propofol N/A 11/16/2013    Procedure: ESOPHAGOGASTRODUODENOSCOPY (EGD) WITH PROPOFOL;  Surgeon: Lear Ng, MD;  Location: [place];  Service: Endoscopy;  Laterality: N/A;  . Gastric varices banding N/A 11/16/2013    Procedure: GASTRIC VARICES BANDING;  Surgeon: Lear Ng, MD;  Location: [place];  Service: Endoscopy;  Laterality: N/A;  . Esophagogastroduodenoscopy (egd) with propofol N/A 01/23/2014    Procedure: ESOPHAGOGASTRODUODENOSCOPY (EGD) WITH PROPOFOL;  Surgeon: Lear Ng, MD;  Location: [place];  Service: Endoscopy;  Laterality: N/A;  . Gastric varices banding N/A 01/23/2014    Procedure: GASTRIC VARICES BANDING;  Surgeon: Lear Ng, MD;  Location: [place];  Service: Endoscopy;  Laterality: N/A;   History   Social History  . Marital Status: Married    Spouse Name: N/A    Number of Children: 3   Occupational History  . retired    Social History Main Topics  . Smoking status: Current Every Day Smoker -- 0.50 packs/day for 36 years    Types: Cigarettes  . Smokeless tobacco: Never Used  . Alcohol Use: No  . Drug Use: No   Current Outpatient Prescriptions on File Prior to Visit  Medication Sig Dispense Refill  . Carboxymethylcellul-Glycerin (CLEAR EYES FOR DRY EYES OP) Apply 1 drop to eye daily.    . Colesevelam HCl 3.75 G PACK Take 1 packet by mouth 3 (three) times a week.    . diphenhydrAMINE (BENADRYL) 25 MG tablet Take 25 mg by mouth every 6 (six) hours as needed for allergies.    Mariane Baumgarten Calcium (STOOL SOFTENER PO) Take 2 tablets by mouth daily as needed (for constipation).    Marland Kitchen empagliflozin (JARDIANCE) 25 MG TABS tablet Take 25 mg by mouth daily. 30 tablet 1  . furosemide (LASIX) 40 MG tablet Take 20 mg by mouth daily.    Marland Kitchen glipiZIDE  (GLIPIZIDE XL) 5 MG 24 hr tablet Take 1 tablet (5 mg total) by mouth daily with breakfast. 30 tablet 2  . glucose blood test strip 1 each by Other route as needed for other. Use to test blood sugar 1 time daily as instructed    . insulin glargine (LANTUS) 100 UNIT/ML injection Inject 40 Units into the skin at bedtime.     Marland Kitchen loperamide (IMODIUM A-D) 2 MG tablet Take 2 mg by mouth 4 (four) times daily as needed for diarrhea or loose stools.    . metFORMIN (GLUCOPHAGE-XR) 500 MG 24 hr tablet TAKE ONE TABLET  BY MOUTH TWICE DAILY WITH MEALS 60 tablet 2  . pantoprazole (PROTONIX) 40 MG tablet Take 40 mg by mouth daily.    . propranolol (INDERAL) 20 MG tablet Take 1 tablet (20 mg total) by mouth 2 (two) times daily. 60 tablet 6  . spironolactone (ALDACTONE) 100 MG tablet Take 100 mg by mouth daily.    . traMADol (ULTRAM) 50 MG tablet Take 50 mg by mouth every 6 (six) hours as needed.    . trimethoprim-polymyxin b (POLYTRIM) ophthalmic solution 1 drop every 4 (four) hours.     No current facility-administered medications on file prior to visit.   Allergies  Allergen Reactions  . Other Anaphylaxis    MSG  . Red Dye Anaphylaxis  . Erythromycin Other (See Comments)    migraine  . Penicillins Other (See Comments)    headaches  . Statins   . Sulfa Antibiotics Hives  . Iohexol Rash   Family History  Problem Relation Age of Onset  . Kidney disease Sister   . Depression Paternal Grandfather   . Diabetes Brother   . Hypertension Mother    PE: BP 118/62 mmHg  Temp(Src) 98.4 F (36.9 C) (Oral)  Resp 12  Wt 176 lb 9.6 oz (80.105 kg) Body mass index is 31.29 kg/(m^2).  Wt Readings from Last 3 Encounters:  09/19/14 176 lb 9.6 oz (80.105 kg)  07/30/14 173 lb (78.472 kg)  06/19/14 170 lb (77.111 kg)   Constitutional: overweight, in NAD Eyes: PERRLA, EOMI, no exophthalmos ENT: moist mucous membranes, no thyromegaly, no cervical lymphadenopathy Cardiovascular: RRR, No MRG Respiratory: CTA  B Gastrointestinal: abdomen soft, NT, ND, BS+ Musculoskeletal: no deformities, strength intact in all 4 Skin: moist, warm, no rashes Neurological: no tremor with outstretched hands, DTR normal in all 4  ASSESSMENT: 1. DM2, insulin-dependent, uncontrolled, with complications - PN  PLAN:  1. Patient with long-standing, uncontrolled diabetes, on oral antidiabetic regimen - sugars continue to improve - at last visit we added Glipizide XL 5 mg in am, however, she had a rash after 1 week of taking it >> saw dermatologist >> now off Glipizide >> rash better. - I suggested:  Patient Instructions  Please continue: - Lantus 40 units at bedtime. - Metformin XR 500 mg 2x a day with meals. - Jardiance 25 mg before breakfast.  Please return in 3 months with your sugar log.   - continue checking sugars at different times of the day - check 1-2 times a day, rotating checks - advised for yearly eye exams >> she is UTD - will check a HbA1c >> 7.4% (improved further!) - will need to change to NPH when she is close to running out of Lantus - donut hole - Return to clinic in 3 mo with sugar log

## 2014-10-07 LAB — HM DIABETES EYE EXAM

## 2014-10-14 ENCOUNTER — Other Ambulatory Visit: Payer: Self-pay | Admitting: Internal Medicine

## 2014-10-15 DIAGNOSIS — K219 Gastro-esophageal reflux disease without esophagitis: Secondary | ICD-10-CM | POA: Diagnosis not present

## 2014-10-15 DIAGNOSIS — D135 Benign neoplasm of extrahepatic bile ducts: Secondary | ICD-10-CM | POA: Diagnosis not present

## 2014-10-15 DIAGNOSIS — K317 Polyp of stomach and duodenum: Secondary | ICD-10-CM | POA: Diagnosis not present

## 2014-10-15 DIAGNOSIS — Z13818 Encounter for screening for other digestive system disorders: Secondary | ICD-10-CM | POA: Diagnosis not present

## 2014-10-15 DIAGNOSIS — D132 Benign neoplasm of duodenum: Secondary | ICD-10-CM | POA: Diagnosis not present

## 2014-10-15 DIAGNOSIS — K766 Portal hypertension: Secondary | ICD-10-CM | POA: Diagnosis not present

## 2014-10-15 DIAGNOSIS — Z794 Long term (current) use of insulin: Secondary | ICD-10-CM | POA: Diagnosis not present

## 2014-10-15 DIAGNOSIS — E119 Type 2 diabetes mellitus without complications: Secondary | ICD-10-CM | POA: Diagnosis not present

## 2014-10-15 DIAGNOSIS — K3189 Other diseases of stomach and duodenum: Secondary | ICD-10-CM | POA: Diagnosis not present

## 2014-10-15 DIAGNOSIS — Z79899 Other long term (current) drug therapy: Secondary | ICD-10-CM | POA: Diagnosis not present

## 2014-10-15 DIAGNOSIS — I8500 Esophageal varices without bleeding: Secondary | ICD-10-CM | POA: Diagnosis not present

## 2014-10-15 DIAGNOSIS — K635 Polyp of colon: Secondary | ICD-10-CM | POA: Diagnosis not present

## 2014-10-17 ENCOUNTER — Other Ambulatory Visit: Payer: Self-pay

## 2014-10-17 DIAGNOSIS — Z13820 Encounter for screening for osteoporosis: Secondary | ICD-10-CM | POA: Diagnosis not present

## 2014-10-17 DIAGNOSIS — Z0000 Encounter for general adult medical examination without abnormal findings: Secondary | ICD-10-CM | POA: Diagnosis not present

## 2014-10-17 DIAGNOSIS — F17200 Nicotine dependence, unspecified, uncomplicated: Secondary | ICD-10-CM | POA: Diagnosis not present

## 2014-10-17 DIAGNOSIS — E1165 Type 2 diabetes mellitus with hyperglycemia: Secondary | ICD-10-CM | POA: Diagnosis not present

## 2014-10-17 DIAGNOSIS — K219 Gastro-esophageal reflux disease without esophagitis: Secondary | ICD-10-CM | POA: Diagnosis not present

## 2014-10-17 DIAGNOSIS — Z23 Encounter for immunization: Secondary | ICD-10-CM | POA: Diagnosis not present

## 2014-10-17 DIAGNOSIS — K7460 Unspecified cirrhosis of liver: Secondary | ICD-10-CM | POA: Diagnosis not present

## 2014-10-17 DIAGNOSIS — Z1389 Encounter for screening for other disorder: Secondary | ICD-10-CM | POA: Diagnosis not present

## 2014-10-17 DIAGNOSIS — Z13220 Encounter for screening for lipoid disorders: Secondary | ICD-10-CM | POA: Diagnosis not present

## 2014-10-18 DIAGNOSIS — Z1231 Encounter for screening mammogram for malignant neoplasm of breast: Secondary | ICD-10-CM | POA: Diagnosis not present

## 2014-11-06 DIAGNOSIS — Z780 Asymptomatic menopausal state: Secondary | ICD-10-CM | POA: Diagnosis not present

## 2014-11-23 ENCOUNTER — Other Ambulatory Visit: Payer: Self-pay | Admitting: Internal Medicine

## 2014-12-18 DIAGNOSIS — E789 Disorder of lipoprotein metabolism, unspecified: Secondary | ICD-10-CM | POA: Diagnosis not present

## 2014-12-20 ENCOUNTER — Other Ambulatory Visit: Payer: Self-pay | Admitting: *Deleted

## 2014-12-20 ENCOUNTER — Other Ambulatory Visit (INDEPENDENT_AMBULATORY_CARE_PROVIDER_SITE_OTHER): Payer: Medicare Other | Admitting: *Deleted

## 2014-12-20 ENCOUNTER — Ambulatory Visit (INDEPENDENT_AMBULATORY_CARE_PROVIDER_SITE_OTHER): Payer: Medicare Other | Admitting: Internal Medicine

## 2014-12-20 ENCOUNTER — Encounter: Payer: Self-pay | Admitting: Internal Medicine

## 2014-12-20 ENCOUNTER — Ambulatory Visit
Admission: RE | Admit: 2014-12-20 | Discharge: 2014-12-20 | Disposition: A | Discharge status: Home / Self Care | Payer: Medicare Other | Source: Ambulatory Visit | Attending: Internal Medicine | Admitting: Internal Medicine

## 2014-12-20 VITALS — BP 118/62 | Temp 97.8°F | Resp 12 | Wt 176.8 lb

## 2014-12-20 DIAGNOSIS — E1165 Type 2 diabetes mellitus with hyperglycemia: Secondary | ICD-10-CM

## 2014-12-20 DIAGNOSIS — E1142 Type 2 diabetes mellitus with diabetic polyneuropathy: Secondary | ICD-10-CM

## 2014-12-20 DIAGNOSIS — M79672 Pain in left foot: Secondary | ICD-10-CM | POA: Diagnosis not present

## 2014-12-20 DIAGNOSIS — M25572 Pain in left ankle and joints of left foot: Secondary | ICD-10-CM

## 2014-12-20 LAB — POCT GLYCOSYLATED HEMOGLOBIN (HGB A1C): HEMOGLOBIN A1C: 7.5

## 2014-12-20 MED ORDER — GLUCOSE BLOOD VI STRP: ORAL_STRIP | Status: DC

## 2014-12-20 MED ORDER — ONETOUCH DELICA LANCETS FINE MISC
Status: DC
Start: 2014-12-20 — End: 2016-04-26

## 2014-12-20 MED ORDER — INSULIN NPH (HUMAN) (ISOPHANE) 100 UNIT/ML ~~LOC~~ SUSP
SUBCUTANEOUS | Status: DC
Start: 2014-12-20 — End: 2014-12-31

## 2014-12-20 NOTE — Progress Notes (Addendum)
Patient ID: Natalie Lane, female   DOB: 1945/04/21, 69 y.o.   MRN: [id number]  HPI: Natalie Lane is a 69 y.o.-year-old female, returning for f/u for DM2, dx in 2004, insulin-dependent since 2015, uncontrolled, with complications (PN). Last visit 3 mo ago. She has M'care part A and B. She has [facility] for drugs.  8 mo ago, she started to have pain in her L last 3 toes + midfoot. This is getting worse. Happening when she puts pressure on the foot.   Last hemoglobin A1c was: Lab Results  Component Value Date   HGBA1C 7.4 09/19/2014   HGBA1C 8.0* 06/19/2014   HGBA1C 10.1* 02/28/2014  11/27/2013: 8.4%  Pt is on a regimen of: - Lantus 40 units qhs - Metformin XR 500 mg 2x a day -added 03/18/2014 - Jardiance 10 >> 25 mg daily in am -added 03/18/2014 (tried Invokana, not covered).  - Welchol 1 package a day We tried Glipizide XL >> rash, palpitations >> she had to stop it after a week.  She was on Metformin - taken off as she had cirrhosis. She had diarrhea with regular metformin. She was on Tradjenta 5 mg in am She was on Welchol 3x a week >> not taking it frequently  Pt checks her sugars 1-2x a day and they are (per log): - am: 160, lower 200s, 220 >> 89, 107-160 >> 98-198 >> 96, 125-150s, 180, 196 >> 110-216 - 2h after b'fast: n/c >> 201, 207 >> n/c >> 140 >> 175-240 - before lunch: n/c >> 134-168, 207x1 >> n/c >> 145 >> 142 - 2h after lunch: n/c >> 257 >> n/c >> 165 - before dinner: n/c >> 160, 189 >> 110-168 >> n/c >> 147 - 2h after dinner: 348 x1 >> 137, 194, 206 >> n/c >> 152-339, 477 - bedtime: n/c >> 142, 203, 208 >> 145-202 >> n/c >> see above - nighttime: n/c No lows. Lowest sugar was 98; she has hypoglycemia awareness at 70.  Highest sugar was 348 >> 257 >> 202 >> 198 >> 477 x1  Glucometer: One Touch   Pt's meals are: - Breakfast: oatmeal or [place] bacon or sausage + eggs - Lunch: chilli beans; vegetable or hamburger soup - Dinner: salad or bowl of  cereals  She saw Antonieta Iba (nutrition).  - no CKD, last BUN/creatinine:  Lab Results  Component Value Date   BUN 21 06/19/2014   CREATININE 1.05 06/19/2014   - last set of lipids: Lab Results  Component Value Date   CHOL 287* 02/28/2014   HDL 62 02/28/2014   LDLCALC 196 02/28/2014   TRIG 148 02/28/2014  On Welchol. Statins >> leg and foot cramps.  - last eye exam was in 09/2014. No DR.  - + numbness and tingling in her feet.  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 Goes back in 01/2015.  She also has a h/o cirrhosis. Latest LFTs reviewed: 10/23/2013: AST/ALT: 24/24  ROS: Constitutional: no weight gain, + fatigue, no subjective hyperthermia; no nocturia Eyes: no blurry vision, no xerophthalmia ENT: no sore throat, no nodules palpated in throat, + dysphagia/no odynophagia, no hoarseness Cardiovascular: no CP/SOB/palpitations/leg swelling Respiratory: no cough/SOB Gastrointestinal: + N/no V/D/C/heartburn Musculoskeletal: no muscle aches/+joint aches (foot) Skin: no rash, + hair loss Neurological: no tremors/numbness/tingling/dizziness, no HA  I  reviewed pt's medications, allergies, PMH, social hx, family hx, and changes were documented in the history of present illness. Otherwise, unchanged from my initial visit note:  Past Medical History  Diagnosis Date  . Atrial flutter   . 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   . Edema   . Diabetes mellitus, type 2   . Hyperlipidemia   . Anemia   . Normal stress echocardiogram 2008  . Cirrhosis   . History of blood transfusion   . GERD (gastroesophageal reflux disease)   . Dysrhythmia     AFlutter  . Complication of anesthesia   . PONV (postoperative nausea and vomiting)     with Cholecystectomy   Past  Surgical History  Procedure Laterality Date  . Asd versus vsd repair  1978    Operative notes not available.  . Atrial ablation surgery  10 /2011  . Other surgical history      partial hysterectomy  . Cholecystectomy    . Hernia repair    . Abdominal hysterectomy    . Coronary artery bypass graft  1978  . Back surgery  1986    x2  . Esophagogastroduodenoscopy (egd) with propofol N/A 11/16/2013    Procedure: ESOPHAGOGASTRODUODENOSCOPY (EGD) WITH PROPOFOL;  Surgeon: Lear Ng, MD;  Location: [place];  Service: Endoscopy;  Laterality: N/A;  . Gastric varices banding N/A 11/16/2013    Procedure: GASTRIC VARICES BANDING;  Surgeon: Lear Ng, MD;  Location: [place];  Service: Endoscopy;  Laterality: N/A;  . Esophagogastroduodenoscopy (egd) with propofol N/A 01/23/2014    Procedure: ESOPHAGOGASTRODUODENOSCOPY (EGD) WITH PROPOFOL;  Surgeon: Lear Ng, MD;  Location: [place];  Service: Endoscopy;  Laterality: N/A;  . Gastric varices banding N/A 01/23/2014    Procedure: GASTRIC VARICES BANDING;  Surgeon: Lear Ng, MD;  Location: [place];  Service: Endoscopy;  Laterality: N/A;   History   Social History  . Marital Status: Married    Spouse Name: N/A    Number of Children: 3   Occupational History  . retired    Social History Main Topics  . Smoking status: Current Every Day Smoker -- 0.50 packs/day for 36 years    Types: Cigarettes  . Smokeless tobacco: Never Used  . Alcohol Use: No  . Drug Use: No   Current Outpatient Prescriptions on File Prior to Visit  Medication Sig Dispense Refill  . Carboxymethylcellul-Glycerin (CLEAR EYES FOR DRY EYES OP) Apply 1 drop to eye daily.    . Colesevelam HCl 3.75 G PACK Take 1 packet by mouth 3 (three) times a week.    . diphenhydrAMINE (BENADRYL) 25 MG tablet Take 25 mg by mouth every 6 (six) hours as needed for allergies.    Mariane Baumgarten Calcium (STOOL SOFTENER PO) Take 2 tablets by mouth  daily as needed (for constipation).    Marland Kitchen empagliflozin (JARDIANCE) 25 MG TABS tablet Take 25 mg by mouth daily. 30 tablet 1  . furosemide (LASIX) 40 MG tablet Take 20 mg by mouth daily.    Marland Kitchen glipiZIDE (GLIPIZIDE XL) 5 MG 24 hr tablet Take 1 tablet (5 mg total) by mouth daily with breakfast. (Patient not taking: Reported on 09/19/2014) 30 tablet 2  . glucose blood test strip 1 each by Other route as needed for other. Use to test blood sugar 1 time daily as instructed    . insulin glargine (LANTUS) 100 UNIT/ML injection Inject 40  Units into the skin at bedtime.     Marland Kitchen JARDIANCE 25 MG TABS tablet TAKE ONE TABLET BY MOUTH ONCE DAILY 30 tablet 1  . loperamide (IMODIUM A-D) 2 MG tablet Take 2 mg by mouth 4 (four) times daily as needed for diarrhea or loose stools.    . metFORMIN (GLUCOPHAGE-XR) 500 MG 24 hr tablet TAKE ONE TABLET BY MOUTH TWICE DAILY WITH MEALS 60 tablet 2  . pantoprazole (PROTONIX) 40 MG tablet Take 40 mg by mouth daily.    . propranolol (INDERAL) 20 MG tablet Take 1 tablet (20 mg total) by mouth 2 (two) times daily. 60 tablet 6  . spironolactone (ALDACTONE) 100 MG tablet Take 100 mg by mouth daily.    . traMADol (ULTRAM) 50 MG tablet Take 50 mg by mouth every 6 (six) hours as needed.    . trimethoprim-polymyxin b (POLYTRIM) ophthalmic solution 1 drop every 4 (four) hours.     No current facility-administered medications on file prior to visit.   Allergies  Allergen Reactions  . Other Anaphylaxis    MSG  . Red Dye Anaphylaxis  . Erythromycin Other (See Comments)    migraine  . Penicillins Other (See Comments)    headaches  . Statins   . Sulfa Antibiotics Hives  . Glipizide Rash  . Iohexol Rash   Family History  Problem Relation Age of Onset  . Kidney disease Sister   . Depression Paternal Grandfather   . Diabetes Brother   . Hypertension Mother    PE: BP 118/62 mmHg  Temp(Src) 97.8 F (36.6 C) (Oral)  Resp 12  Wt 176 lb 12.8 oz (80.196 kg) Body mass index is 31.33  kg/(m^2).  Wt Readings from Last 3 Encounters:  12/20/14 176 lb 12.8 oz (80.196 kg)  09/19/14 176 lb 9.6 oz (80.105 kg)  07/30/14 173 lb (78.472 kg)   Constitutional: overweight, in NAD Eyes: PERRLA, EOMI, no exophthalmos ENT: moist mucous membranes, no thyromegaly, no cervical lymphadenopathy Cardiovascular: RRR, No MRG Respiratory: CTA B Gastrointestinal: abdomen soft, NT, ND, BS+ Musculoskeletal: no deformities, strength intact in all 4 Skin: moist, warm, no rashes Neurological: no tremor with outstretched hands, DTR normal in all 4  ASSESSMENT: 1. DM2, insulin-dependent, uncontrolled, with complications - PN  2. Foot pain  PLAN:  1. Patient with long-standing, uncontrolled diabetes, on oral antidiabetic regimen - sugars worse in last 3 weeks. Will switch to NPH as she cannot afford Lantus anymore. Will use 20 units 2x a day. - I suggested:  Patient Instructions  Please return in 1.5 months with your sugar log.   Please stop at [place] for a foot Xray.  Please continue: - Metformin XR 500 mg 2x a day with meals. - Jardiance 25 mg before breakfast.  Stop Lantus and start NPH insulin:  - 20 units in am and 20 units at bedtime   - continue checking sugars at different times of the day - check 1-2 times a day, rotating checks - advised for yearly eye exams >> she is UTD - given new meter as hers is very old - will check a HbA1c >> 7.5% (~same) - Return to clinic in 1.5 mo with sugar log   2. 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 No known injury. Initial evaluation.  EXAM: LEFT FOOT - COMPLETE 3+ VIEW  COMPARISON: No available  prior.  FINDINGS: No acute bony or joint abnormality identified . Soft tissue structures are unremarkable.  IMPRESSION: No acute or focal abnormality.   Electronically Signed  By: Pillager  On:  12/20/2014 11:40   Normal Xray.

## 2014-12-20 NOTE — Patient Instructions (Addendum)
Please return in 1.5 months with your sugar log.   Please stop at [place] for a foot Xray.  Please continue: - Metformin XR 500 mg 2x a day with meals. - Jardiance 25 mg before breakfast.  Stop Lantus and start NPH insulin:  - 20 units in am and 20 units at bedtime

## 2014-12-31 ENCOUNTER — Telehealth: Payer: Self-pay | Admitting: Internal Medicine

## 2014-12-31 MED ORDER — LANTUS SOLOSTAR 100 UNIT/ML ~~LOC~~ SOPN: PEN_INJECTOR | SUBCUTANEOUS | Status: DC

## 2014-12-31 MED ORDER — INSULIN PEN NEEDLE 32G X 4 MM MISC
Status: DC
Start: 2014-12-31 — End: 2016-04-26

## 2014-12-31 NOTE — Telephone Encounter (Signed)
Please read message below and advise.  

## 2014-12-31 NOTE — Telephone Encounter (Signed)
She would get a better deal for vials of NPH at [facility]... We can call in Lantus for now.

## 2014-12-31 NOTE — Telephone Encounter (Signed)
Called pt and advised her that Lantus has been sent to her pharmacy.

## 2014-12-31 NOTE — Telephone Encounter (Signed)
Patient stated that the NPH is more expensive than the  Lantus, she ask if you could call in a refill of the Lantus she can afford that.  Send to [facility] on [address],  the card she was given was only good for people that didn't have insurance.

## 2015-01-14 ENCOUNTER — Other Ambulatory Visit: Payer: Self-pay | Admitting: Cardiology

## 2015-01-14 ENCOUNTER — Other Ambulatory Visit: Payer: Self-pay | Admitting: Internal Medicine

## 2015-01-15 ENCOUNTER — Other Ambulatory Visit: Payer: Self-pay

## 2015-01-15 MED ORDER — PROPRANOLOL HCL 20 MG PO TABS: 20.0000 mg | ORAL_TABLET | Freq: Two times a day (BID) | ORAL | Status: DC

## 2015-01-15 NOTE — Telephone Encounter (Signed)
[name], MD at 07/30/2014 10:10 AM  propranolol (INDERAL) 20 MG tablet Take 1 tablet (20 mg total) by mouth 2 (two) times daily.  Patient Instructions     Continue your current therapy  I will see you in one year

## 2015-01-15 NOTE — Telephone Encounter (Signed)
Rx(s) sent to pharmacy electronically.  

## 2015-01-22 ENCOUNTER — Other Ambulatory Visit: Payer: Self-pay | Admitting: Internal Medicine

## 2015-01-31 ENCOUNTER — Encounter: Payer: Self-pay | Admitting: Internal Medicine

## 2015-01-31 ENCOUNTER — Ambulatory Visit (INDEPENDENT_AMBULATORY_CARE_PROVIDER_SITE_OTHER): Payer: Medicare Other | Admitting: Internal Medicine

## 2015-01-31 VITALS — BP 114/62 | HR 67 | Temp 97.9°F | Resp 12 | Wt 179.4 lb

## 2015-01-31 DIAGNOSIS — E1165 Type 2 diabetes mellitus with hyperglycemia: Secondary | ICD-10-CM | POA: Diagnosis not present

## 2015-01-31 DIAGNOSIS — E1142 Type 2 diabetes mellitus with diabetic polyneuropathy: Secondary | ICD-10-CM

## 2015-01-31 MED ORDER — INSULIN NPH (HUMAN) (ISOPHANE) 100 UNIT/ML ~~LOC~~ SUSP: 20.0000 [IU] | Freq: Two times a day (BID) | SUBCUTANEOUS | Status: DC

## 2015-01-31 NOTE — Patient Instructions (Signed)
Please return in 1.5 months with your sugar log.   Please continue: - Metformin XR 500 mg 2x a day with meals. - Jardiance 25 mg before breakfast.  Change from Lantus to NPH: - 20 units in am and 20 units at bedtime  If the sugars are not <130 in am or <180 after meals, then we need to add Glipizide XL 2.5 mg daily before b'fast. Please let me know if this happens.   Please return in 1.5 months with your sugar log.

## 2015-01-31 NOTE — Progress Notes (Signed)
Patient ID: Natalie Lane, female   DOB: [birth year], 69 y.o.   MRN: [id number]  HPI: CHUMY MOMENT is a 69 y.o.-year-old female, returning for f/u for DM2, dx in 2004, insulin-dependent since 2015, uncontrolled, with complications (PN). Last visit 1.5 mo ago. She has M'care part A and B. She has [employer] for drugs.  She fell 2 weeks ago >> hit head and shin.   Last hemoglobin A1c was: Lab Results  Component Value Date   HGBA1C 7.5 12/20/2014   HGBA1C 7.4 09/19/2014   HGBA1C 8.0* 06/19/2014  11/27/2013: 8.4%  Pt is on a regimen of: - Lantus 40 units qhs - Metformin XR 500 mg 2x a day - added 03/18/2014 - Jardiance 25 mg daily in am - added 03/18/2014  - Welchol 1 package a day She tried Invokana, not covered.  We tried Glipizide XL >> rash, palpitations >> she had to stop it after a week.  She was on Metformin - taken off as she had cirrhosis. She had diarrhea with regular metformin. She was on Tradjenta 5 mg in am She was on Welchol 3x a week >> not taking it frequently  Pt checks her sugars 1-2x a day and they are (per log): - am: 160, lower 200s, 220 >> 89, 107-160 >> 98-198 >> 96, 125-150s, 180, 196 >> 110-216 >> 101, 141-179, 194 - 2h after b'fast: n/c >> 201, 207 >> n/c >> 140 >> 175-240 >> n/c - before lunch: n/c >> 134-168, 207x1 >> n/c >> 145 >> 142 >> 188 - 2h after lunch: n/c >> 257 >> n/c >> 165 >> n/c - before dinner: n/c >> 160, 189 >> 110-168 >> n/c >> 147 >> 188 - 2h after dinner: 348 x1 >> 137, 194, 206 >> n/c >> 152-339, 477 >> 207 - bedtime: n/c >> 142, 203, 208 >> 145-202 >> n/c >> see above >> n/c - nighttime: n/c No lows. Lowest sugar was 98; she has hypoglycemia awareness at 70.  Highest sugar was 348 >> 257 >> 202 >> 198 >> 477 x1 >> 207  Glucometer: One Touch   Pt's meals are: - Breakfast: oatmeal or [place] bacon or sausage + eggs - Lunch: chilli beans; vegetable or hamburger soup - Dinner: salad or bowl of cereals  She saw Antonieta Iba  (nutrition).  - no CKD, last BUN/creatinine:  Lab Results  Component Value Date   BUN 21 06/19/2014   CREATININE 1.05 06/19/2014   - last set of lipids: Lab Results  Component Value Date   CHOL 287* 02/28/2014   HDL 62 02/28/2014   LDLCALC 196 02/28/2014   TRIG 148 02/28/2014  On Welchol. Statins >> leg and foot cramps.  - last eye exam was in 09/2014. No DR.  - + numbness and tingling in her feet.  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 Goes back in 01/2015.  She also has a h/o cirrhosis. Latest LFTs reviewed: 10/23/2013: AST/ALT: 24/24  ROS: Constitutional: no weight gain, + fatigue, no subjective hyperthermia; no nocturia Eyes: no blurry vision, no xerophthalmia ENT: no sore throat, no nodules palpated in throat, + dysphagia/no odynophagia, no hoarseness Cardiovascular: no CP/SOB/palpitations/leg swelling Respiratory: no cough/SOB Gastrointestinal: + N/no V/D/C/heartburn Musculoskeletal: no muscle aches/+joint aches (foot) Skin: no rash, + hair loss Neurological: no tremors/numbness/tingling/dizziness, no HA  I reviewed pt's medications, allergies, PMH, social hx, family hx, and changes were documented in the history of present illness. Otherwise, unchanged from my initial visit note:  Past Medical History  Diagnosis Date  . Atrial flutter ([place])   . 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   . Edema   . Diabetes mellitus, type 2 ([place])   . Hyperlipidemia   . Anemia   . Normal stress echocardiogram 2008  . Cirrhosis ([place])   . History of blood transfusion   . GERD (gastroesophageal reflux disease)   . Dysrhythmia     AFlutter  . Complication of anesthesia   . PONV (postoperative nausea and vomiting)     with Cholecystectomy   Past Surgical History   Procedure Laterality Date  . Asd versus vsd repair  1978    Operative notes not available.  . Atrial ablation surgery  10 /2011  . Other surgical history      partial hysterectomy  . Cholecystectomy    . Hernia repair    . Abdominal hysterectomy    . Coronary artery bypass graft  1978  . Back surgery  1986    x2  . Esophagogastroduodenoscopy (egd) with propofol N/A 11/16/2013    Procedure: ESOPHAGOGASTRODUODENOSCOPY (EGD) WITH PROPOFOL;  Surgeon: Lear Ng, MD;  Location: [place];  Service: Endoscopy;  Laterality: N/A;  . Gastric varices banding N/A 11/16/2013    Procedure: GASTRIC VARICES BANDING;  Surgeon: Lear Ng, MD;  Location: [place];  Service: Endoscopy;  Laterality: N/A;  . Esophagogastroduodenoscopy (egd) with propofol N/A 01/23/2014    Procedure: ESOPHAGOGASTRODUODENOSCOPY (EGD) WITH PROPOFOL;  Surgeon: Lear Ng, MD;  Location: [place];  Service: Endoscopy;  Laterality: N/A;  . Gastric varices banding N/A 01/23/2014    Procedure: GASTRIC VARICES BANDING;  Surgeon: Lear Ng, MD;  Location: [place];  Service: Endoscopy;  Laterality: N/A;   History   Social History  . Marital Status: Married    Spouse Name: N/A    Number of Children: 3   Occupational History  . retired    Social History Main Topics  . Smoking status: Current Every Day Smoker -- 0.50 packs/day for 36 years    Types: Cigarettes  . Smokeless tobacco: Never Used  . Alcohol Use: No  . Drug Use: No   Current Outpatient Prescriptions on File Prior to Visit  Medication Sig Dispense Refill  . Carboxymethylcellul-Glycerin (CLEAR EYES FOR DRY EYES OP) Apply 1 drop to eye daily.    . Colesevelam HCl 3.75 G PACK Take 1 packet by mouth 3 (three) times a week.    . diphenhydrAMINE (BENADRYL) 25 MG tablet Take 25 mg by mouth every 6 (six) hours as needed for allergies.    Mariane Baumgarten Calcium (STOOL SOFTENER PO) Take 2 tablets by mouth daily as needed (for  constipation).    Marland Kitchen empagliflozin (JARDIANCE) 25 MG TABS tablet Take 25 mg by mouth daily. 30 tablet 1  . furosemide (LASIX) 40 MG tablet Take 20 mg by mouth daily.    Marland Kitchen glipiZIDE (GLIPIZIDE XL) 5 MG 24 hr tablet - not taking Take 1 tablet (5 mg total) by mouth daily with breakfast. 30 tablet 2  . glucose blood (ONETOUCH VERIO) test strip Use to test blood sugar 2 times daily as instructed. Dx: E11.42, E11.65 100 each 4  . glucose blood test strip 1 each by Other route as  needed for other. Use to test blood sugar 1 time daily as instructed    . Insulin Pen Needle 32G X 4 MM MISC Use to inject insulin 2 times daily. 80 each 2  . JARDIANCE 25 MG TABS tablet TAKE ONE TABLET BY MOUTH ONCE DAILY 30 tablet 2  . LANTUS SOLOSTAR 100 UNIT/ML Solostar Pen Inject 20 units into the skin in the morning and 20 units at bedtime. 15 mL 2  . loperamide (IMODIUM A-D) 2 MG tablet Take 2 mg by mouth 4 (four) times daily as needed for diarrhea or loose stools.    . metFORMIN (GLUCOPHAGE-XR) 500 MG 24 hr tablet TAKE ONE TABLET BY MOUTH TWICE DAILY WITH MEALS 60 tablet 1  . ONETOUCH DELICA LANCETS FINE MISC Use to test blood sugar 2 times daily as instructed. Dx: E11.42, E11.65 100 each 4  . pantoprazole (PROTONIX) 40 MG tablet Take 40 mg by mouth daily.    . propranolol (INDERAL) 20 MG tablet TAKE ONE TABLET BY MOUTH TWICE DAILY 60 tablet 6  . propranolol (INDERAL) 20 MG tablet Take 1 tablet (20 mg total) by mouth 2 (two) times daily. 180 tablet 1  . spironolactone (ALDACTONE) 100 MG tablet Take 100 mg by mouth daily.    . traMADol (ULTRAM) 50 MG tablet Take 50 mg by mouth every 6 (six) hours as needed.    . trimethoprim-polymyxin b (POLYTRIM) ophthalmic solution 1 drop every 4 (four) hours.     No current facility-administered medications on file prior to visit.   Allergies  Allergen Reactions  . Other Anaphylaxis    MSG  . Red Dye Anaphylaxis  . Erythromycin Other (See Comments)    migraine  . Penicillins  Other (See Comments)    headaches  . Statins   . Sulfa Antibiotics Hives  . Glipizide Rash  . Iohexol Rash   Family History  Problem Relation Age of Onset  . Kidney disease Sister   . Depression Paternal Grandfather   . Diabetes Brother   . Hypertension Mother    PE: BP 114/62 mmHg  Pulse 67  Temp(Src) 97.9 F (36.6 C) (Oral)  Resp 12  Wt 179 lb 6.4 oz (81.375 kg)  SpO2 97% Body mass index is 31.79 kg/(m^2).  Wt Readings from Last 3 Encounters:  01/31/15 179 lb 6.4 oz (81.375 kg)  12/20/14 176 lb 12.8 oz (80.196 kg)  09/19/14 176 lb 9.6 oz (80.105 kg)   Constitutional: overweight, in NAD Eyes: PERRLA, EOMI, no exophthalmos ENT: moist mucous membranes, no thyromegaly, no cervical lymphadenopathy Cardiovascular: RRR,+2/6SEM, no RG Respiratory: CTA B Gastrointestinal: abdomen soft, NT, ND, BS+ Musculoskeletal: no deformities, strength intact in all 4 Skin: moist, warm, no rashes Neurological: no tremor with outstretched hands, DTR normal in all 4  ASSESSMENT: 1. DM2, insulin-dependent, uncontrolled, with complications - PN  2. Foot pain  PLAN:  1. Patient with long-standing, uncontrolled diabetes, on oral antidiabetic regimen + basal insulin - sugars better but still above goal. Not checking sugars later in the day >> advised to restart doing so. Will switch to NPH (she found out how to get it cheaper) as she cannot afford Lantus anymore. Will use 20 units 2x a day. If this is not enough >> will need Glipizide XL 2.5 mg added - I suggested:  Patient Instructions  Please return in 1.5 months with your sugar log.   Please continue: - Metformin XR 500 mg 2x a day with meals. - Jardiance 25 mg before breakfast.  Change from Lantus to NPH: - 20 units in am and 20 units at bedtime  If the sugars are not <130 in am or <180 after meals, then we need to add Glipizide XL 2.5 mg daily before b'fast. Please let me know if this happens.   Please return in 1.5 months with  your sugar log.    - continue checking sugars at different times of the day - check 1-2 times a day, rotating checks - advised for yearly eye exams >> she is UTD - Return to clinic in 2 mo with sugar log

## 2015-03-11 DIAGNOSIS — E119 Type 2 diabetes mellitus without complications: Secondary | ICD-10-CM | POA: Diagnosis not present

## 2015-03-11 DIAGNOSIS — I8500 Esophageal varices without bleeding: Secondary | ICD-10-CM | POA: Diagnosis not present

## 2015-03-11 DIAGNOSIS — Z7984 Long term (current) use of oral hypoglycemic drugs: Secondary | ICD-10-CM | POA: Diagnosis not present

## 2015-03-11 DIAGNOSIS — K7460 Unspecified cirrhosis of liver: Secondary | ICD-10-CM | POA: Diagnosis not present

## 2015-03-11 DIAGNOSIS — K766 Portal hypertension: Secondary | ICD-10-CM | POA: Diagnosis not present

## 2015-03-11 DIAGNOSIS — Z79899 Other long term (current) drug therapy: Secondary | ICD-10-CM | POA: Diagnosis not present

## 2015-03-11 DIAGNOSIS — D132 Benign neoplasm of duodenum: Secondary | ICD-10-CM | POA: Diagnosis not present

## 2015-03-11 DIAGNOSIS — R188 Other ascites: Secondary | ICD-10-CM | POA: Diagnosis not present

## 2015-03-11 DIAGNOSIS — Z794 Long term (current) use of insulin: Secondary | ICD-10-CM | POA: Diagnosis not present

## 2015-03-11 DIAGNOSIS — Z91041 Radiographic dye allergy status: Secondary | ICD-10-CM | POA: Diagnosis not present

## 2015-03-11 DIAGNOSIS — D135 Benign neoplasm of extrahepatic bile ducts: Secondary | ICD-10-CM | POA: Diagnosis not present

## 2015-03-11 DIAGNOSIS — Z882 Allergy status to sulfonamides status: Secondary | ICD-10-CM | POA: Diagnosis not present

## 2015-03-11 DIAGNOSIS — Z86010 Personal history of colonic polyps: Secondary | ICD-10-CM | POA: Diagnosis not present

## 2015-03-11 DIAGNOSIS — Z880 Allergy status to penicillin: Secondary | ICD-10-CM | POA: Diagnosis not present

## 2015-03-11 DIAGNOSIS — K3189 Other diseases of stomach and duodenum: Secondary | ICD-10-CM | POA: Diagnosis not present

## 2015-03-11 DIAGNOSIS — K219 Gastro-esophageal reflux disease without esophagitis: Secondary | ICD-10-CM | POA: Diagnosis not present

## 2015-03-13 ENCOUNTER — Other Ambulatory Visit: Payer: Self-pay | Admitting: Internal Medicine

## 2015-04-02 ENCOUNTER — Ambulatory Visit: Payer: Medicare Other | Admitting: Internal Medicine

## 2015-04-21 ENCOUNTER — Ambulatory Visit
Admission: RE | Admit: 2015-04-21 | Discharge: 2015-04-21 | Disposition: A | Discharge status: Home / Self Care | Payer: Medicare Other | Source: Ambulatory Visit | Attending: Family Medicine | Admitting: Family Medicine

## 2015-04-21 ENCOUNTER — Other Ambulatory Visit: Payer: Self-pay | Admitting: Family Medicine

## 2015-04-21 DIAGNOSIS — M25552 Pain in left hip: Secondary | ICD-10-CM

## 2015-04-21 DIAGNOSIS — M25562 Pain in left knee: Secondary | ICD-10-CM | POA: Diagnosis not present

## 2015-04-21 DIAGNOSIS — S79912A Unspecified injury of left hip, initial encounter: Secondary | ICD-10-CM | POA: Diagnosis not present

## 2015-04-21 DIAGNOSIS — R944 Abnormal results of kidney function studies: Secondary | ICD-10-CM | POA: Diagnosis not present

## 2015-04-24 ENCOUNTER — Other Ambulatory Visit: Payer: Self-pay | Admitting: Internal Medicine

## 2015-04-29 ENCOUNTER — Other Ambulatory Visit (INDEPENDENT_AMBULATORY_CARE_PROVIDER_SITE_OTHER): Payer: Medicare Other | Admitting: *Deleted

## 2015-04-29 ENCOUNTER — Ambulatory Visit (INDEPENDENT_AMBULATORY_CARE_PROVIDER_SITE_OTHER): Payer: Medicare Other | Admitting: Internal Medicine

## 2015-04-29 ENCOUNTER — Encounter: Payer: Self-pay | Admitting: Internal Medicine

## 2015-04-29 VITALS — BP 114/60 | HR 73 | Temp 98.3°F | Resp 12 | Wt 186.0 lb

## 2015-04-29 DIAGNOSIS — E1165 Type 2 diabetes mellitus with hyperglycemia: Secondary | ICD-10-CM

## 2015-04-29 DIAGNOSIS — E1142 Type 2 diabetes mellitus with diabetic polyneuropathy: Secondary | ICD-10-CM

## 2015-04-29 DIAGNOSIS — J0100 Acute maxillary sinusitis, unspecified: Secondary | ICD-10-CM | POA: Diagnosis not present

## 2015-04-29 DIAGNOSIS — R0989 Other specified symptoms and signs involving the circulatory and respiratory systems: Secondary | ICD-10-CM | POA: Diagnosis not present

## 2015-04-29 DIAGNOSIS — R05 Cough: Secondary | ICD-10-CM | POA: Diagnosis not present

## 2015-04-29 LAB — POCT GLYCOSYLATED HEMOGLOBIN (HGB A1C): HEMOGLOBIN A1C: 7.2

## 2015-04-29 MED ORDER — INSULIN NPH (HUMAN) (ISOPHANE) 100 UNIT/ML ~~LOC~~ SUSP: 20.0000 [IU] | Freq: Two times a day (BID) | SUBCUTANEOUS | Status: DC

## 2015-04-29 NOTE — Patient Instructions (Signed)
Please continue: - Metformin XR 500 mg 2x a day with meals. - Jardiance 25 mg before breakfast. - NPH 20 units in am and 20 units at bedtime  Please return in 3 months with your sugar log.    Please call Dr Thompson Caul office for a visit to evaluate your lungs.

## 2015-04-29 NOTE — Progress Notes (Addendum)
Patient ID: Natalie Lane, female   DOB: [birth year], 70 y.o.   MRN: [id number]  HPI: Natalie Lane is a 70 y.o.-year-old female, returning for f/u for DM2, dx in 2004, insulin-dependent since 2015, uncontrolled, with complications (PN). Last visit 3 mo ago. She has M'care part A and B. She has [facility] for drugs.  She had an URI for last week.  Last hemoglobin A1c was: Lab Results  Component Value Date   HGBA1C 7.5 12/20/2014   HGBA1C 7.4 09/19/2014   HGBA1C 8.0* 06/19/2014  11/27/2013: 8.4%  Pt was on a regimen of: - Lantus 40 units qhs - Metformin XR 500 mg 2x a day - added 03/18/2014 - Jardiance 25 mg daily in am - added 03/18/2014  - Welchol 1 package a day  Now on: - Metformin XR 500 mg 2x a day with meals. - Jardiance 25 mg before breakfast. - NPH 20 units in am and 20 units at bedtime Stopped Welchol. She tried Invokana, not covered.  We tried Glipizide XL >> rash, palpitations >> she had to stop it after a week.  She was on Metformin - taken off as she had cirrhosis. She had diarrhea with regular metformin. She was on Tradjenta 5 mg in am She was on Welchol 3x a week >> not taking it frequently  Pt checks her sugars 1-2x a day and they are (per log): - am: 9, 107-160 >> 98-198 >> 96, 125-150s, 180, 196 >> 110-216 >> 101, 141-179, 194 >> 88, 121-160, 185, 238 x1 - 2h after b'fast: n/c >> 201, 207 >> n/c >> 140 >> 175-240 >> n/c >> 171 - before lunch: n/c >> 134-168, 207x1 >> n/c >> 145 >> 142 >> 188 >> n/c - 2h after lunch: n/c >> 257 >> n/c >> 165 >> n/c - before dinner: n/c >> 160, 189 >> 110-168 >> n/c >> 147 >> 188 >> n/c - 2h after dinner: 348 x1 >> 137, 194, 206 >> n/c >> 152-339, 477 >> 207 >> n/c - bedtime: n/c >> 142, 203, 208 >> 145-202 >> n/c >> see above >> n/c - nighttime: n/c No lows. Lowest sugar was 98 >> 88; she has hypoglycemia awareness at 70.  Highest sugar was 348 >> 257 >> 202 >> 198 >> 477 x1 >> 207 >> 238 (forgot  insulin)  Glucometer: One Touch Verio Flex  Pt's meals are: - Breakfast: oatmeal or [place] bacon or sausage + eggs - Lunch: chilli beans; vegetable or hamburger soup - Dinner: salad or bowl of cereals  She saw Antonieta Iba (nutrition).  - no CKD, last BUN/creatinine:  Lab Results  Component Value Date   BUN 21 06/19/2014   CREATININE 1.05 06/19/2014   - last set of lipids: Lab Results  Component Value Date   CHOL 287* 02/28/2014   HDL 62 02/28/2014   LDLCALC 196 02/28/2014   TRIG 148 02/28/2014  On Welchol. Statins >> leg and foot cramps.  - last eye exam was in 09/2014. No DR.  - + numbness and tingling in her feet.  She had a mass removed from the pancreas (Dr Amalia Hailey at [facility]) >> she was called that it was benign but cannot give details >> she went back in 04/2014 >> removed again (larger) by EGD. Goes back in 01/2015.  She also has a h/o cirrhosis. Latest LFTs reviewed: 10/23/2013: AST/ALT: 24/24 Lab Results  Component Value Date   ALT 23 09/01/2011   AST 21 09/01/2011  ALKPHOS 68 09/01/2011   BILITOT 0.4 09/01/2011   ROS: Constitutional: + weight gain, + fatigue, no subjective hyperthermia; no nocturia Eyes: no blurry vision, no xerophthalmia ENT: + sore throat, no nodules palpated in throat, + dysphagia/no odynophagia, no hoarseness Cardiovascular: no CP/+ SOB/+ palpitations/+ leg swelling Respiratory: + cough/+ SOB/+ wheezing Gastrointestinal: + N/no V/+ D/no C/heartburn Musculoskeletal: +muscle aches/+ joint aches Skin: no rash, + hair loss Neurological: no tremors/numbness/tingling/dizziness, no HA  I reviewed pt's medications, allergies, PMH, social hx, family hx, and changes were documented in the history of present illness. Otherwise, unchanged from my initial visit note, exc. Stopped Lasix and started Meloxicam.  Past Medical History  Diagnosis Date  . Atrial flutter ([place])   . CHD (congenital heart disease)     with previous ASD versus VSD  repair; old op notes not available.   . Edema   . Diabetes mellitus, type 2 ([place])   . Hyperlipidemia   . Anemia   . Normal stress echocardiogram 2008  . Cirrhosis ([place])   . History of blood transfusion   . GERD (gastroesophageal reflux disease)   . Dysrhythmia     AFlutter  . Complication of anesthesia   . PONV (postoperative nausea and vomiting)     with Cholecystectomy   Past Surgical History  Procedure Laterality Date  . Asd versus vsd repair  1978    Operative notes not available.  . Atrial ablation surgery  10 /2011  . Other surgical history      partial hysterectomy  . Cholecystectomy    . Hernia repair    . Abdominal hysterectomy    . Coronary artery bypass graft  1978  . Back surgery  1986    x2  . Esophagogastroduodenoscopy (egd) with propofol N/A 11/16/2013    Procedure: ESOPHAGOGASTRODUODENOSCOPY (EGD) WITH PROPOFOL;  Surgeon: Lear Ng, MD;  Location: [place];  Service: Endoscopy;  Laterality: N/A;  . Gastric varices banding N/A 11/16/2013    Procedure: GASTRIC VARICES BANDING;  Surgeon: Lear Ng, MD;  Location: [place];  Service: Endoscopy;  Laterality: N/A;  . Esophagogastroduodenoscopy (egd) with propofol N/A 01/23/2014    Procedure: ESOPHAGOGASTRODUODENOSCOPY (EGD) WITH PROPOFOL;  Surgeon: Lear Ng, MD;  Location: [place];  Service: Endoscopy;  Laterality: N/A;  . Gastric varices banding N/A 01/23/2014    Procedure: GASTRIC VARICES BANDING;  Surgeon: Lear Ng, MD;  Location: [place];  Service: Endoscopy;  Laterality: N/A;   History   Social History  . Marital Status: Married    Spouse Name: N/A    Number of Children: 3   Occupational History  . retired    Social History Main Topics  . Smoking status: Current Every Day Smoker -- 0.50 packs/day for 36 years    Types: Cigarettes  . Smokeless tobacco: Never Used  . Alcohol Use: No  . Drug Use: No   Current Outpatient Rx  Name  Route  Sig   Dispense  Refill  . Carboxymethylcellul-Glycerin (CLEAR EYES FOR DRY EYES OP)   Ophthalmic   Apply 1 drop to eye daily.         . diphenhydrAMINE (BENADRYL) 25 MG tablet   Oral   Take 25 mg by mouth every 6 (six) hours as needed for allergies.         Mariane Baumgarten Calcium (STOOL SOFTENER PO)   Oral   Take 2 tablets by mouth daily as needed (for constipation).         Marland Kitchen  glucose blood (ONETOUCH VERIO) test strip      Use to test blood sugar 2 times daily as instructed. Dx: E11.42, E11.65   100 each   4   . glucose blood test strip   Other   1 each by Other route as needed for other. Use to test blood sugar 1 time daily as instructed         . insulin NPH Human (HUMULIN N) 100 UNIT/ML injection   Subcutaneous   Inject 0.2 mLs (20 Units total) into the skin 2 (two) times daily.   45 mL   1     Pens please   . Insulin Pen Needle 32G X 4 MM MISC      Use to inject insulin 2 times daily.   80 each   2   . JARDIANCE 25 MG TABS tablet      TAKE ONE TABLET BY MOUTH ONCE DAILY   30 tablet   1   . loperamide (IMODIUM A-D) 2 MG tablet   Oral   Take 2 mg by mouth 4 (four) times daily as needed for diarrhea or loose stools.         . meloxicam (MOBIC) 15 MG tablet   Oral   Take 15 mg by mouth daily.         . metFORMIN (GLUCOPHAGE-XR) 500 MG 24 hr tablet      TAKE ONE TABLET BY MOUTH TWICE DAILY WITH MEALS   60 tablet   2   . ONETOUCH DELICA LANCETS FINE MISC      Use to test blood sugar 2 times daily as instructed. Dx: E11.42, E11.65   100 each   4   . pantoprazole (PROTONIX) 40 MG tablet   Oral   Take 40 mg by mouth daily.         . propranolol (INDERAL) 20 MG tablet      TAKE ONE TABLET BY MOUTH TWICE DAILY   60 tablet   6   . propranolol (INDERAL) 20 MG tablet   Oral   Take 1 tablet (20 mg total) by mouth 2 (two) times daily.   180 tablet   1   . spironolactone (ALDACTONE) 100 MG tablet   Oral   Take 100 mg by mouth daily.          . traMADol (ULTRAM) 50 MG tablet   Oral   Take 50 mg by mouth every 6 (six) hours as needed.         . trimethoprim-polymyxin b (POLYTRIM) ophthalmic solution      1 drop every 4 (four) hours.         . furosemide (LASIX) 40 MG tablet   Oral   Take 20 mg by mouth daily. Reported on 04/29/2015           Allergies  Allergen Reactions  . Other Anaphylaxis    MSG  . Red Dye Anaphylaxis  . Erythromycin Other (See Comments)    migraine  . Penicillins Other (See Comments)    headaches  . Statins   . Sulfa Antibiotics Hives  . Glipizide Rash  . Iohexol Rash   Family History  Problem Relation Age of Onset  . Kidney disease Sister   . Depression Paternal Grandfather   . Diabetes Brother   . Hypertension Mother    PE: BP 114/60 mmHg  Pulse 73  Temp(Src) 98.3 F (36.8 C) (Oral)  Resp 12  Wt 186 lb (84.369 kg)  SpO2 94% Body mass index is 32.96 kg/(m^2).  Wt Readings from Last 3 Encounters:  04/29/15 186 lb (84.369 kg)  01/31/15 179 lb 6.4 oz (81.375 kg)  12/20/14 176 lb 12.8 oz (80.196 kg)   Constitutional: overweight, in NAD Eyes: PERRLA, EOMI, no exophthalmos ENT: moist mucous membranes, no thyromegaly, no cervical lymphadenopathy Cardiovascular: RRR,+2/6SEM, no RG, mild periankle edema (wears compression socks) Respiratory: crackles up to 2/3 of B lung fields Gastrointestinal: abdomen soft, NT, ND, BS+ Musculoskeletal: no deformities, strength intact in all 4 Skin: moist, warm, no rashes Neurological: no tremor with outstretched hands, DTR normal in all 4  ASSESSMENT: 1. DM2, insulin-dependent, uncontrolled, with complications - PN  2. Bilateral lung crackles  PLAN:  1. Patient with long-standing, uncontrolled diabetes, on oral antidiabetic regimen + basal insulin - sugars better - only few above goal. Did not need to add Glipizide. - Not checking sugars later in the day >> again advised to restart doing so.  - I suggested:  Patient Instructions   Please continue: - Metformin XR 500 mg 2x a day with meals. - Jardiance 25 mg before breakfast. - NPH 20 units in am and 20 units at bedtime  Please return in 3 months with your sugar log.    - continue checking sugars at different times of the day - check 1-2 times a day, rotating checks - advised for yearly eye exams >> she is UTD - will need to obtain Lipid levels from PCP - checked HbA1c today >> 7.2% (better!) - Return to clinic in 2 mo with sugar log   2. B lung Crackles - Pt with recent URI x 1 week, now a little better. She also has been off Furosemide as she ran out and had to see her cardiologist before refills >> kept postponing this. I am not sure at this point if she is only fluid overloaded or has superimposed PNA, but she has 7 lbs weight gain since last visit, has SOB, and B crackles >> I suggested that she sees PCP right away. Pt will call office to schedule appt.

## 2015-06-03 DIAGNOSIS — D135 Benign neoplasm of extrahepatic bile ducts: Secondary | ICD-10-CM | POA: Diagnosis not present

## 2015-06-03 DIAGNOSIS — K7460 Unspecified cirrhosis of liver: Secondary | ICD-10-CM | POA: Diagnosis not present

## 2015-06-03 DIAGNOSIS — E119 Type 2 diabetes mellitus without complications: Secondary | ICD-10-CM | POA: Diagnosis not present

## 2015-06-03 DIAGNOSIS — Z794 Long term (current) use of insulin: Secondary | ICD-10-CM | POA: Diagnosis not present

## 2015-06-03 DIAGNOSIS — Z7984 Long term (current) use of oral hypoglycemic drugs: Secondary | ICD-10-CM | POA: Diagnosis not present

## 2015-06-03 DIAGNOSIS — F17210 Nicotine dependence, cigarettes, uncomplicated: Secondary | ICD-10-CM | POA: Diagnosis not present

## 2015-06-03 DIAGNOSIS — Z86010 Personal history of colonic polyps: Secondary | ICD-10-CM | POA: Diagnosis not present

## 2015-06-03 DIAGNOSIS — K219 Gastro-esophageal reflux disease without esophagitis: Secondary | ICD-10-CM | POA: Diagnosis not present

## 2015-06-03 DIAGNOSIS — Z880 Allergy status to penicillin: Secondary | ICD-10-CM | POA: Diagnosis not present

## 2015-06-11 ENCOUNTER — Other Ambulatory Visit: Payer: Self-pay | Admitting: Internal Medicine

## 2015-06-26 ENCOUNTER — Other Ambulatory Visit: Payer: Self-pay | Admitting: Internal Medicine

## 2015-07-14 ENCOUNTER — Other Ambulatory Visit: Payer: Self-pay | Admitting: Cardiology

## 2015-07-15 NOTE — Telephone Encounter (Signed)
Rx(s) sent to pharmacy electronically.  

## 2015-07-16 ENCOUNTER — Other Ambulatory Visit: Payer: Self-pay | Admitting: Cardiology

## 2015-07-16 NOTE — Telephone Encounter (Signed)
RN SPOKE TO PHARMACY  PRESCRIPTION WAS E-SENT ON 07/15/15  STATED THAT  PHARMACY IS WAITING FOR A SHIPMENT TO COME INTO FILL THE COMPLETE ORDER  BUT PATIENT CAN A PARTIAL ORDER IF NEEDED  RN CALLED INFORMED PATIENT TO CONTACT PHARMACY TO INFORM THEM SHE NEEDS PARTIAL ORDER PATIENT VERBALIZED UNDERSTANDING.

## 2015-07-16 NOTE — Telephone Encounter (Signed)
New message       *STAT* If patient is at the pharmacy, call can be transferred to refill team.   1. Which medications need to be refilled? (please list name of each medication and dose if known) propranolol 20mg  2. Which pharmacy/location (including street and city if local pharmacy) is medication to be sent to? [facility] at [address]. Do they need a 30 day or 90 day supply? 30 day Pt has an appt with Truitt Merle on 08-06-15.  She states she has 2 pills left

## 2015-08-06 ENCOUNTER — Ambulatory Visit (INDEPENDENT_AMBULATORY_CARE_PROVIDER_SITE_OTHER): Payer: Medicare Other | Admitting: Nurse Practitioner

## 2015-08-06 ENCOUNTER — Encounter: Payer: Self-pay | Admitting: Nurse Practitioner

## 2015-08-06 VITALS — BP 128/78 | Ht 63.0 in | Wt 182.8 lb

## 2015-08-06 DIAGNOSIS — I483 Typical atrial flutter: Secondary | ICD-10-CM | POA: Diagnosis not present

## 2015-08-06 DIAGNOSIS — R011 Cardiac murmur, unspecified: Secondary | ICD-10-CM

## 2015-08-06 DIAGNOSIS — Z8774 Personal history of (corrected) congenital malformations of heart and circulatory system: Secondary | ICD-10-CM

## 2015-08-06 DIAGNOSIS — Q249 Congenital malformation of heart, unspecified: Secondary | ICD-10-CM

## 2015-08-06 DIAGNOSIS — Z9889 Other specified postprocedural states: Secondary | ICD-10-CM | POA: Diagnosis not present

## 2015-08-06 MED ORDER — PROPRANOLOL HCL 20 MG PO TABS: 20.0000 mg | ORAL_TABLET | Freq: Two times a day (BID) | ORAL | Status: DC

## 2015-08-06 NOTE — Progress Notes (Signed)
CARDIOLOGY OFFICE NOTE  Date:  08/06/2015    Natalie Lane Date of Birth: 12-05-1945 Medical Record [id number]  PCP:  Reginia Naas, MD  Cardiologist:  Natacha Jepsen & Martin[place]    Chief Complaint  Patient presents with  . Irregular Heart Beat    1 year check - seen for Dr. Martinique    History of Present Illness: Natalie Lane is a 70 y.o. female who presents today for a one year check. Seen for Dr. Martinique.   She has a remote history of atrial flutter. 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 She had a normal stress Echo in the past. Echo in 2013 showed no shunt and normal LV/RV function.  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 She is s/p banding. Due to her varices she was switched from metoprolol to propranolol. Initially she became very weak on this and tired. This  improved but she still liked the metoprolol better. 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   Seen a year ago and was felt to be doing well.  Comes back today. Here alone. Doing ok from our standpoint. No chest pain. Not short of breath. Has been referred to [place] for her cirrhosis/pancreatic adenoma which apparently keeps coming back. She is no longer on her diuretic therapy - notes that Dr. Michail Sermon did not refill because she has not been seeing him. She has been off these agents about 6 months with no real problem. She has not discussed this with the GI doctor in [place]. 2 falls over the past year - one was while she was standing on the edge of her bathtub cleaning a wall and fell. Lost her balance the 2nd time. Fortunately, no significant injury. No passing out spells. She notes her rhythm has been doing ok - very rare palpitations.   Past Medical History  Diagnosis Date  . Atrial flutter ([place])   . 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   . Edema   . Diabetes  mellitus, type 2 ([place])   . Hyperlipidemia   . Anemia   . Normal stress echocardiogram 2008  . Cirrhosis ([place])   . History of blood transfusion   . GERD (gastroesophageal reflux disease)   . Dysrhythmia     AFlutter  . Complication of anesthesia   . PONV (postoperative nausea and vomiting)     with Cholecystectomy    Past Surgical History  Procedure Laterality Date  . Asd versus vsd repair  1978    Operative notes not available.  . Atrial ablation surgery  10 /2011  . Other surgical history      partial hysterectomy  . Cholecystectomy    . Hernia repair    . Abdominal hysterectomy    . Coronary artery bypass graft  1978  . Back surgery  1986    x2  . Esophagogastroduodenoscopy (egd) with propofol N/A 11/16/2013    Procedure: ESOPHAGOGASTRODUODENOSCOPY (EGD) WITH PROPOFOL;  Surgeon: Lear Ng, MD;  Location: [place];  Service: Endoscopy;  Laterality: N/A;  . Gastric varices banding N/A 11/16/2013    Procedure: GASTRIC VARICES BANDING;  Surgeon: Lear Ng, MD;  Location: [place];  Service: Endoscopy;  Laterality: N/A;  . Esophagogastroduodenoscopy (egd) with propofol N/A 01/23/2014    Procedure: ESOPHAGOGASTRODUODENOSCOPY (EGD) WITH PROPOFOL;  Surgeon: Loanne Drilling.  Michail Sermon, MD;  Location: [place];  Service: Endoscopy;  Laterality: N/A;  . Gastric varices banding N/A 01/23/2014    Procedure: GASTRIC VARICES BANDING;  Surgeon: Lear Ng, MD;  Location: [place];  Service: Endoscopy;  Laterality: N/A;     Medications: Current Outpatient Prescriptions  Medication Sig Dispense Refill  . cholecalciferol (VITAMIN D) 1000 units tablet Take 1,000 Units by mouth daily.    . diphenhydrAMINE (BENADRYL) 25 MG tablet Take 25 mg by mouth every 6 (six) hours as needed for allergies.    Mariane Baumgarten Calcium (STOOL SOFTENER PO) Take 2 tablets by mouth daily as needed (for constipation).    Marland Kitchen glucose blood (ONETOUCH VERIO) test strip Use to test blood  sugar 2 times daily as instructed. Dx: E11.42, E11.65 100 each 4  . ibuprofen (ADVIL,MOTRIN) 200 MG tablet Take 200 mg by mouth every 6 (six) hours as needed for headache or moderate pain (arthritis).    . insulin NPH Human (HUMULIN N) 100 UNIT/ML injection Inject 0.2 mLs (20 Units total) into the skin 2 (two) times daily. 45 mL 1  . Insulin Pen Needle 32G X 4 MM MISC Use to inject insulin 2 times daily. 80 each 2  . JARDIANCE 25 MG TABS tablet TAKE ONE TABLET BY MOUTH ONCE DAILY 30 tablet 2  . loperamide (IMODIUM A-D) 2 MG tablet Take 2 mg by mouth 4 (four) times daily as needed for diarrhea or loose stools.    . metFORMIN (GLUCOPHAGE-XR) 500 MG 24 hr tablet TAKE ONE TABLET BY MOUTH TWICE DAILY WITH MEALS 60 tablet 2  . ONETOUCH DELICA LANCETS FINE MISC Use to test blood sugar 2 times daily as instructed. Dx: E11.42, E11.65 100 each 4  . pantoprazole (PROTONIX) 40 MG tablet Take 40 mg by mouth daily.    . propranolol (INDERAL) 20 MG tablet TAKE ONE TABLET BY MOUTH TWICE DAILY 180 tablet 2   No current facility-administered medications for this visit.    Allergies: Allergies  Allergen Reactions  . Other Anaphylaxis    MSG  . Red Dye Anaphylaxis  . Erythromycin Other (See Comments)    migraine  . Penicillins Other (See Comments)    headaches  . Statins   . Sulfa Antibiotics Hives  . Glipizide Rash  . Iohexol Rash    Social History: The patient  reports that she has been smoking Cigarettes.  She has a 18 pack-year smoking history. She has never used smokeless tobacco. She reports that she does not drink alcohol or use illicit drugs.   Family History: The patient's family history includes Depression in her paternal grandfather; Diabetes in her brother; Hypertension in her mother; Kidney disease in her sister.   Review of Systems: Please see the history of present illness.   Otherwise, the review of systems is positive for none.   All other systems are reviewed and negative.    Physical Exam: VS:  BP 128/78 mmHg  Ht 5\' 3"  (1.6 m)  Wt 182 lb 12.8 oz (82.918 kg)  BMI 32.39 kg/m2  SpO2 97% .  BMI Body mass index is 32.39 kg/(m^2).  Wt Readings from Last 3 Encounters:  08/06/15 182 lb 12.8 oz (82.918 kg)  04/29/15 186 lb (84.369 kg)  01/31/15 179 lb 6.4 oz (81.375 kg)    General: Pleasant. Well developed, well nourished and in no acute distress.  HEENT: Normal. Neck: Supple, no JVD, carotid bruits, or masses noted.  Cardiac: Regular rate and rhythm. 2/6 outflow murmur noted.  No edema.  Respiratory:  Lungs are clear to auscultation bilaterally with normal work of breathing.  GI: Soft and nontender.  MS: No deformity or atrophy. Gait and ROM intact. Skin: Warm and dry. Color is normal.  Neuro:  Strength and sensation are intact and no gross focal deficits noted.  Psych: Alert, appropriate and with normal affect.   LABORATORY DATA:  EKG:  EKG is ordered today. This demonstrates NSR with anterior T wave changes. This is unchanged from tracing dated back to 05-26-2010.   Lab Results  Component Value Date   WBC 5.9 09/01/2011   HGB 12.7 09/01/2011   HCT 38.3 09/01/2011   PLT 131* 09/01/2011   GLUCOSE 252* 06/19/2014   CHOL 287* 02/28/2014   TRIG 148 02/28/2014   HDL 62 02/28/2014   LDLCALC 196 02/28/2014   ALT 23 09/01/2011   AST 21 09/01/2011   NA 134* 06/19/2014   K 4.9 06/19/2014   CL 101 06/19/2014   CREATININE 1.05 06/19/2014   BUN 21 06/19/2014   CO2 28 06/19/2014   TSH 1.637 12/10/2009   INR 1.34 12/11/2010   HGBA1C 7.2 04/29/2015    BNP (last 3 results) No results for input(s): BNP in the last 8760 hours.  ProBNP (last 3 results) No results for input(s): PROBNP in the last 8760 hours.   Other Studies Reviewed Today:  Echo Study Conclusions from 05/26/11  - Left ventricle: The cavity size was normal. Wall thickness was normal. Systolic function was normal. The estimated ejection fraction was in the range of 60% to 65%.  Wall motion was normal; there were no regional wall motion abnormalities. Features are consistent with a pseudonormal left ventricular filling pattern, with concomitant abnormal relaxation and increased filling pressure (grade 2 diastolic dysfunction). - Aortic valve: There was no stenosis. - Mitral valve: Mildly calcified annulus. Mildly calcified leaflets . Trivial regurgitation. - Left atrium: The atrium was mildly dilated. - Right ventricle: The cavity size was normal. Systolic function was normal. - Right atrium: The atrium was mildly dilated. - Tricuspid valve: Peak RV-RA gradient: 65mm Hg (S). - Pulmonary arteries: PA systolic pressure [id number] mmHg. - Systemic veins: IVC measured 2.2 cm with normal respirophasic variation, suggesting RA pressure 6-10 mmHg. Impressions:  - Normal LV size and systolic function, EF 123456. Moderate diastolic dysfunction. Normal RV size and systolic function. Borderline pulmonary hypertension.  Assessment/Plan: 1. Remote VSD repair. No shunt by last Echo that dates back to 05-26-11. Does have more pronounced murmur no exam - will get her echo updated.   2. Remote history of atrial flutter. Resolved. No sustained arrhythmia. She is not a candidate for anticoagulation due to cirrhosis and gastric varices.   3. Cirrhosis with gastric varices.   4. Pancreatic ampulla adenoma.   5. DM  Current medicines are reviewed with the patient today.  The patient does not have concerns regarding medicines other than what has been noted above.  The following changes have been made:  See above.  Labs/ tests ordered today include:    Orders Placed This Encounter  Procedures  . EKG 12-Lead  . ECHOCARDIOGRAM COMPLETE     Disposition:   FU with Dr. Martinique in one year.   Patient is agreeable to this plan and will call if any problems develop in the interim.   Signed: Burtis Junes, RN, ANP-C 08/06/2015 8:49 AM  [facility] [address] [place], [place]  [address] Phone: [phone] Fax: [phone])  [phone]         

## 2015-08-06 NOTE — Patient Instructions (Addendum)
We will be checking the following labs today - NONE   Medication Instructions:    Continue with your current medicines.     Testing/Procedures To Be Arranged:  Echocardiogram - follow up murmur  Follow-Up:   See Dr. Martinique in one year    Other Special Instructions:   N/A    If you need a refill on your cardiac medications before your next appointment, please call your pharmacy.   Call the [place] office at [phone] if you have any questions, problems or concerns.

## 2015-08-08 ENCOUNTER — Ambulatory Visit (INDEPENDENT_AMBULATORY_CARE_PROVIDER_SITE_OTHER): Payer: Medicare Other | Admitting: Internal Medicine

## 2015-08-08 ENCOUNTER — Encounter: Payer: Self-pay | Admitting: Internal Medicine

## 2015-08-08 VITALS — BP 120/68 | HR 80 | Ht 63.0 in | Wt 181.0 lb

## 2015-08-08 DIAGNOSIS — E1165 Type 2 diabetes mellitus with hyperglycemia: Secondary | ICD-10-CM | POA: Diagnosis not present

## 2015-08-08 DIAGNOSIS — E1142 Type 2 diabetes mellitus with diabetic polyneuropathy: Secondary | ICD-10-CM

## 2015-08-08 LAB — POCT GLYCOSYLATED HEMOGLOBIN (HGB A1C): Hemoglobin A1C: 7.3

## 2015-08-08 MED ORDER — INSULIN NPH (HUMAN) (ISOPHANE) 100 UNIT/ML ~~LOC~~ SUSP: 20.0000 [IU] | Freq: Two times a day (BID) | SUBCUTANEOUS | Status: DC

## 2015-08-08 MED ORDER — INSULIN SYRINGE-NEEDLE U-100 31G X 15/64" 0.3 ML MISC
Status: DC
Start: 2015-08-08 — End: 2016-04-26

## 2015-08-08 NOTE — Patient Instructions (Signed)
Please continue: - Metformin XR 500 mg 2x a day with meals. - Jardiance 25 mg before breakfast. - NPH 20 units in am and 20 units after dinner  Try to get the NPH vials.  Please return in 3 months with your sugar log.

## 2015-08-08 NOTE — Progress Notes (Signed)
Patient ID: [name], female   DOB: Apr 08, 1945, 70 y.o.   MRN: [id number]  HPI: Natalie Lane is a 70 y.o.-year-old female, returning for f/u for DM2, dx in 2004, insulin-dependent since 2015, uncontrolled, with complications (PN). Last visit 3 mo ago. She has M'care part A and B. She has [employer] for drugs.  She has a sinusitis.  She will have a 2D Echo in July.  Last hemoglobin A1c was: Lab Results  Component Value Date   HGBA1C 7.2 04/29/2015   HGBA1C 7.5 12/20/2014   HGBA1C 7.4 09/19/2014  11/27/2013: 8.4%  Pt was on a regimen of: - Lantus 40 units qhs - Metformin XR 500 mg 2x a day - added 03/18/2014 - Jardiance 25 mg daily in am - added 03/18/2014  - Welchol 1 package a day  Now on: - Metformin XR 500 mg 2x a day with meals. - Jardiance 25 mg before breakfast. - NPH 20 units in am and 20 units at bedtime Stopped Welchol. She tried Invokana, not covered.  We tried Glipizide XL >> rash, palpitations >> she had to stop it after a week.  She was on Metformin - taken off as she had cirrhosis. She had diarrhea with regular metformin. She was on Tradjenta 5 mg in am She was on Welchol 3x a week >> not taking it frequently  Pt checks her sugars 1-2x a day and they are (per log): - am: 98-198 >> 96, 125-150s, 180, 196 >> 110-216 >> 101, 141-179, 194 >> 88, 121-160, 185, 238 x1 >> 121-200 - 2h after b'fast: n/c >> 201, 207 >> n/c >> 140 >> 175-240 >> n/c >> 171 >> n/c - before lunch: n/c >> 134-168, 207x1 >> n/c >> 145 >> 142 >> 188 >> n/c >> 127-184 - 2h after lunch: n/c >> 257 >> n/c >> 165 >> n/c - before dinner: n/c >> 160, 189 >> 110-168 >> n/c >> 147 >> 188 >> n/c - 2h after dinner: 348 x1 >> 137, 194, 206 >> n/c >> 152-339, 477 >> 207 >> n/c >> 174 - bedtime: n/c >> 142, 203, 208 >> 145-202 >> n/c >> see above >> n/c - nighttime: n/c No lows. Lowest sugar was 98 >> 88; she has hypoglycemia awareness at 70.  Highest sugar was 348 >> 257 >> 202 >> 198 >>  477 x1 >> 207 >> 238 (forgot insulin) >> 200  Glucometer: One Touch Verio Flex  Pt's meals are: - Breakfast: oatmeal or [place] bacon or sausage + eggs - Lunch: chilli beans; vegetable or hamburger soup - Dinner: salad or bowl of cereals  She saw Antonieta Iba (nutrition).  - no CKD, last BUN/creatinine:  Lab Results  Component Value Date   BUN 21 06/19/2014   CREATININE 1.05 06/19/2014   - last set of lipids: Lab Results  Component Value Date   CHOL 287* 02/28/2014   HDL 62 02/28/2014   LDLCALC 196 02/28/2014   TRIG 148 02/28/2014  On Welchol. Statins >> leg and foot cramps.  - last eye exam was in 09/2014. No DR.  - + numbness and tingling in her feet.  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 Goes back in 01/2015.  She also has a h/o cirrhosis. Latest LFTs reviewed: 10/23/2013: AST/ALT: 24/24 Lab Results  Component  Value Date   ALT 23 09/01/2011   AST 21 09/01/2011   ALKPHOS 68 09/01/2011   BILITOT 0.4 09/01/2011   ROS: Constitutional: + weight gain, + fatigue, no subjective hyperthermia; + nocturia Eyes: no blurry vision, no xerophthalmia ENT: no sore throat, no nodules palpated in throat, + dysphagia/no odynophagia, no hoarseness Cardiovascular: no CP/SOB/+ palpitations/leg swelling Respiratory: no cough/SOB/wheezing Gastrointestinal: no N/no V/+ D/no C/+ heartburn Musculoskeletal: +muscle aches/+ joint aches Skin: no rash, + hair loss Neurological: no tremors/numbness/tingling/dizziness, + HA  I reviewed pt's medications, allergies, PMH, social hx, family hx, and changes were documented in the history of present illness. Otherwise, unchanged from my initial visit note, exc. Stopped Lasix and started Meloxicam.  Past Medical History  Diagnosis Date  . Atrial flutter ([place])   . CHD (congenital heart disease)      with previous ASD versus VSD repair; old op notes not available.   . Edema   . Diabetes mellitus, type 2 ([place])   . Hyperlipidemia   . Anemia   . Normal stress echocardiogram 2008  . Cirrhosis ([place])   . History of blood transfusion   . GERD (gastroesophageal reflux disease)   . Dysrhythmia     AFlutter  . Complication of anesthesia   . PONV (postoperative nausea and vomiting)     with Cholecystectomy   Past Surgical History  Procedure Laterality Date  . Asd versus vsd repair  1978    Operative notes not available.  . Atrial ablation surgery  10 /2011  . Other surgical history      partial hysterectomy  . Cholecystectomy    . Hernia repair    . Abdominal hysterectomy    . Coronary artery bypass graft  1978  . Back surgery  1986    x2  . Esophagogastroduodenoscopy (egd) with propofol N/A 11/16/2013    Procedure: ESOPHAGOGASTRODUODENOSCOPY (EGD) WITH PROPOFOL;  Surgeon: Lear Ng, MD;  Location: [place];  Service: Endoscopy;  Laterality: N/A;  . Gastric varices banding N/A 11/16/2013    Procedure: GASTRIC VARICES BANDING;  Surgeon: Lear Ng, MD;  Location: [place];  Service: Endoscopy;  Laterality: N/A;  . Esophagogastroduodenoscopy (egd) with propofol N/A 01/23/2014    Procedure: ESOPHAGOGASTRODUODENOSCOPY (EGD) WITH PROPOFOL;  Surgeon: Lear Ng, MD;  Location: [place];  Service: Endoscopy;  Laterality: N/A;  . Gastric varices banding N/A 01/23/2014    Procedure: GASTRIC VARICES BANDING;  Surgeon: Lear Ng, MD;  Location: [place];  Service: Endoscopy;  Laterality: N/A;   History   Social History  . Marital Status: Married    Spouse Name: N/A    Number of Children: 3   Occupational History  . retired    Social History Main Topics  . Smoking status: Current Every Day Smoker -- 0.50 packs/day for 36 years    Types: Cigarettes  . Smokeless tobacco: Never Used  . Alcohol Use: No  . Drug Use: No   Current  Outpatient Rx  Name  Route  Sig  Dispense  Refill  . cholecalciferol (VITAMIN D) 1000 units tablet   Oral   Take 1,000 Units by mouth daily.         . diphenhydrAMINE (BENADRYL) 25 MG tablet   Oral   Take 25 mg by mouth every 6 (six) hours as needed for allergies.         Mariane Baumgarten Calcium (STOOL SOFTENER PO)   Oral   Take 2 tablets by mouth daily as needed (  for constipation).         Marland Kitchen glucose blood (ONETOUCH VERIO) test strip      Use to test blood sugar 2 times daily as instructed. Dx: E11.42, E11.65   100 each   4   . ibuprofen (ADVIL,MOTRIN) 200 MG tablet   Oral   Take 200 mg by mouth every 6 (six) hours as needed for headache or moderate pain (arthritis).         . insulin NPH Human (HUMULIN N) 100 UNIT/ML injection   Subcutaneous   Inject 0.2 mLs (20 Units total) into the skin 2 (two) times daily.   45 mL   1     Pens please   . Insulin Pen Needle 32G X 4 MM MISC      Use to inject insulin 2 times daily.   80 each   2   . JARDIANCE 25 MG TABS tablet      TAKE ONE TABLET BY MOUTH ONCE DAILY   30 tablet   2   . loperamide (IMODIUM A-D) 2 MG tablet   Oral   Take 2 mg by mouth 4 (four) times daily as needed for diarrhea or loose stools.         . metFORMIN (GLUCOPHAGE-XR) 500 MG 24 hr tablet      TAKE ONE TABLET BY MOUTH TWICE DAILY WITH MEALS   60 tablet   2   . ONETOUCH DELICA LANCETS FINE MISC      Use to test blood sugar 2 times daily as instructed. Dx: E11.42, E11.65   100 each   4   . pantoprazole (PROTONIX) 40 MG tablet   Oral   Take 40 mg by mouth daily.         . propranolol (INDERAL) 20 MG tablet   Oral   Take 1 tablet (20 mg total) by mouth 2 (two) times daily.   60 tablet   11     Allergies  Allergen Reactions  . Other Anaphylaxis    MSG  . Red Dye Anaphylaxis  . Erythromycin Other (See Comments)    migraine  . Penicillins Other (See Comments)    headaches  . Statins   . Sulfa Antibiotics Hives  . Glipizide  Rash  . Iohexol Rash   Family History  Problem Relation Age of Onset  . Kidney disease Sister   . Depression Paternal Grandfather   . Diabetes Brother   . Hypertension Mother    PE: BP 150/82 mmHg  Pulse 80  Ht 5\' 3"  (1.6 m)  Wt 181 lb (82.101 kg)  BMI 32.07 kg/m2  SpO2 98% Body mass index is 32.07 kg/(m^2). On repeat BP: 120/68.  Wt Readings from Last 3 Encounters:  08/08/15 181 lb (82.101 kg)  08/06/15 182 lb 12.8 oz (82.918 kg)  04/29/15 186 lb (84.369 kg)   Constitutional: overweight, in NAD Eyes: PERRLA, EOMI, no exophthalmos ENT: moist mucous membranes, no thyromegaly, no cervical lymphadenopathy Cardiovascular: RRR,+2/6SEM, no RG, mild periankle edema (wears compression socks) Respiratory: CTA B Gastrointestinal: abdomen soft, NT, ND, BS+ Musculoskeletal: no deformities, strength intact in all 4 Skin: moist, warm, no rashes Neurological: no tremor with outstretched hands, DTR normal in all 4  ASSESSMENT: 1. DM2, insulin-dependent, uncontrolled, with complications - PN  PLAN:  1. Patient with long-standing, uncontrolled diabetes, on oral antidiabetic regimen + basal insulin - sugars higher in am as she may forget the NPH dose at night. She is also not affording the NPH >>  will give her  A Rx for vials of NPH to see if cheaper. - Not checking sugars later in the day >> again advised to restart doing so.  - I suggested:  Patient Instructions  Please continue: - Metformin XR 500 mg 2x a day with meals. - Jardiance 25 mg before breakfast. - NPH 20 units in am and 20 units after dinner  Try to get the NPH vials.  Please return in 3 months with your sugar log.   - continue checking sugars at different times of the day - check 1-2 times a day, rotating checks - advised for yearly eye exams >> she is UTD - will need to obtain Lipid levels from PCP - checked HbA1c today >> 7.3% (slightly higher) - Return to clinic in 3 mo with sugar log

## 2015-08-18 DIAGNOSIS — D122 Benign neoplasm of ascending colon: Secondary | ICD-10-CM | POA: Diagnosis not present

## 2015-08-18 DIAGNOSIS — Z9689 Presence of other specified functional implants: Secondary | ICD-10-CM | POA: Diagnosis not present

## 2015-08-18 DIAGNOSIS — Z79899 Other long term (current) drug therapy: Secondary | ICD-10-CM | POA: Diagnosis not present

## 2015-08-18 DIAGNOSIS — D132 Benign neoplasm of duodenum: Secondary | ICD-10-CM | POA: Diagnosis not present

## 2015-08-18 DIAGNOSIS — K9189 Other postprocedural complications and disorders of digestive system: Secondary | ICD-10-CM | POA: Diagnosis not present

## 2015-08-18 DIAGNOSIS — D125 Benign neoplasm of sigmoid colon: Secondary | ICD-10-CM | POA: Diagnosis not present

## 2015-08-18 DIAGNOSIS — Z794 Long term (current) use of insulin: Secondary | ICD-10-CM | POA: Diagnosis not present

## 2015-08-18 DIAGNOSIS — R188 Other ascites: Secondary | ICD-10-CM | POA: Diagnosis not present

## 2015-08-18 DIAGNOSIS — Z7984 Long term (current) use of oral hypoglycemic drugs: Secondary | ICD-10-CM | POA: Diagnosis not present

## 2015-08-18 DIAGNOSIS — Z86010 Personal history of colonic polyps: Secondary | ICD-10-CM | POA: Diagnosis not present

## 2015-08-18 DIAGNOSIS — Z888 Allergy status to other drugs, medicaments and biological substances status: Secondary | ICD-10-CM | POA: Diagnosis not present

## 2015-08-18 DIAGNOSIS — K648 Other hemorrhoids: Secondary | ICD-10-CM | POA: Diagnosis not present

## 2015-08-18 DIAGNOSIS — Z90710 Acquired absence of both cervix and uterus: Secondary | ICD-10-CM | POA: Diagnosis not present

## 2015-08-18 DIAGNOSIS — I8500 Esophageal varices without bleeding: Secondary | ICD-10-CM | POA: Diagnosis not present

## 2015-08-18 DIAGNOSIS — T183XXA Foreign body in small intestine, initial encounter: Secondary | ICD-10-CM | POA: Diagnosis not present

## 2015-08-18 DIAGNOSIS — Z9889 Other specified postprocedural states: Secondary | ICD-10-CM | POA: Diagnosis not present

## 2015-08-18 DIAGNOSIS — K3189 Other diseases of stomach and duodenum: Secondary | ICD-10-CM | POA: Diagnosis not present

## 2015-08-18 DIAGNOSIS — Z880 Allergy status to penicillin: Secondary | ICD-10-CM | POA: Diagnosis not present

## 2015-08-18 DIAGNOSIS — Z91041 Radiographic dye allergy status: Secondary | ICD-10-CM | POA: Diagnosis not present

## 2015-08-18 DIAGNOSIS — K7460 Unspecified cirrhosis of liver: Secondary | ICD-10-CM | POA: Diagnosis not present

## 2015-08-18 DIAGNOSIS — E119 Type 2 diabetes mellitus without complications: Secondary | ICD-10-CM | POA: Diagnosis not present

## 2015-08-18 DIAGNOSIS — F17200 Nicotine dependence, unspecified, uncomplicated: Secondary | ICD-10-CM | POA: Diagnosis not present

## 2015-08-18 DIAGNOSIS — Z1211 Encounter for screening for malignant neoplasm of colon: Secondary | ICD-10-CM | POA: Diagnosis not present

## 2015-08-18 DIAGNOSIS — K5730 Diverticulosis of large intestine without perforation or abscess without bleeding: Secondary | ICD-10-CM | POA: Diagnosis not present

## 2015-08-18 DIAGNOSIS — Z882 Allergy status to sulfonamides status: Secondary | ICD-10-CM | POA: Diagnosis not present

## 2015-08-18 DIAGNOSIS — K766 Portal hypertension: Secondary | ICD-10-CM | POA: Diagnosis not present

## 2015-08-18 DIAGNOSIS — K635 Polyp of colon: Secondary | ICD-10-CM | POA: Diagnosis not present

## 2015-08-18 DIAGNOSIS — K219 Gastro-esophageal reflux disease without esophagitis: Secondary | ICD-10-CM | POA: Diagnosis not present

## 2015-08-18 DIAGNOSIS — D126 Benign neoplasm of colon, unspecified: Secondary | ICD-10-CM | POA: Diagnosis not present

## 2015-08-18 DIAGNOSIS — Z9102 Food additives allergy status: Secondary | ICD-10-CM | POA: Diagnosis not present

## 2015-08-26 ENCOUNTER — Encounter (INDEPENDENT_AMBULATORY_CARE_PROVIDER_SITE_OTHER): Payer: Self-pay

## 2015-08-26 ENCOUNTER — Other Ambulatory Visit: Payer: Self-pay | Admitting: Gastroenterology

## 2015-08-26 ENCOUNTER — Ambulatory Visit (HOSPITAL_COMMUNITY): Payer: Medicare Other | Attending: Cardiology

## 2015-08-26 ENCOUNTER — Other Ambulatory Visit: Payer: Self-pay

## 2015-08-26 DIAGNOSIS — Z9889 Other specified postprocedural states: Secondary | ICD-10-CM

## 2015-08-26 DIAGNOSIS — R197 Diarrhea, unspecified: Secondary | ICD-10-CM | POA: Diagnosis not present

## 2015-08-26 DIAGNOSIS — I358 Other nonrheumatic aortic valve disorders: Secondary | ICD-10-CM | POA: Insufficient documentation

## 2015-08-26 DIAGNOSIS — I350 Nonrheumatic aortic (valve) stenosis: Secondary | ICD-10-CM | POA: Insufficient documentation

## 2015-08-26 DIAGNOSIS — E669 Obesity, unspecified: Secondary | ICD-10-CM | POA: Diagnosis not present

## 2015-08-26 DIAGNOSIS — K7460 Unspecified cirrhosis of liver: Secondary | ICD-10-CM

## 2015-08-26 DIAGNOSIS — I517 Cardiomegaly: Secondary | ICD-10-CM | POA: Diagnosis not present

## 2015-08-26 DIAGNOSIS — I071 Rheumatic tricuspid insufficiency: Secondary | ICD-10-CM | POA: Diagnosis not present

## 2015-08-26 DIAGNOSIS — D132 Benign neoplasm of duodenum: Secondary | ICD-10-CM | POA: Diagnosis not present

## 2015-08-26 DIAGNOSIS — Z8774 Personal history of (corrected) congenital malformations of heart and circulatory system: Secondary | ICD-10-CM | POA: Diagnosis not present

## 2015-08-26 DIAGNOSIS — Z6832 Body mass index (BMI) 32.0-32.9, adult: Secondary | ICD-10-CM | POA: Diagnosis not present

## 2015-08-26 DIAGNOSIS — I509 Heart failure, unspecified: Secondary | ICD-10-CM | POA: Insufficient documentation

## 2015-08-26 DIAGNOSIS — Z87891 Personal history of nicotine dependence: Secondary | ICD-10-CM | POA: Insufficient documentation

## 2015-08-26 DIAGNOSIS — I483 Typical atrial flutter: Secondary | ICD-10-CM

## 2015-08-26 DIAGNOSIS — I371 Nonrheumatic pulmonary valve insufficiency: Secondary | ICD-10-CM | POA: Diagnosis not present

## 2015-08-26 DIAGNOSIS — Q249 Congenital malformation of heart, unspecified: Secondary | ICD-10-CM | POA: Insufficient documentation

## 2015-08-26 DIAGNOSIS — I059 Rheumatic mitral valve disease, unspecified: Secondary | ICD-10-CM | POA: Insufficient documentation

## 2015-08-26 DIAGNOSIS — E785 Hyperlipidemia, unspecified: Secondary | ICD-10-CM | POA: Diagnosis not present

## 2015-08-26 DIAGNOSIS — E119 Type 2 diabetes mellitus without complications: Secondary | ICD-10-CM | POA: Insufficient documentation

## 2015-08-26 DIAGNOSIS — R1084 Generalized abdominal pain: Secondary | ICD-10-CM | POA: Diagnosis not present

## 2015-08-26 DIAGNOSIS — R011 Cardiac murmur, unspecified: Secondary | ICD-10-CM | POA: Diagnosis present

## 2015-09-03 ENCOUNTER — Ambulatory Visit
Admission: RE | Admit: 2015-09-03 | Discharge: 2015-09-03 | Disposition: A | Discharge status: Home / Self Care | Payer: Medicare Other | Source: Ambulatory Visit | Attending: Gastroenterology | Admitting: Gastroenterology

## 2015-09-03 DIAGNOSIS — K7460 Unspecified cirrhosis of liver: Secondary | ICD-10-CM | POA: Diagnosis not present

## 2015-09-09 ENCOUNTER — Other Ambulatory Visit: Payer: Self-pay | Admitting: Internal Medicine

## 2015-09-29 ENCOUNTER — Other Ambulatory Visit: Payer: Self-pay | Admitting: Internal Medicine

## 2015-10-14 ENCOUNTER — Other Ambulatory Visit: Payer: Self-pay | Admitting: Internal Medicine

## 2015-11-04 ENCOUNTER — Ambulatory Visit (INDEPENDENT_AMBULATORY_CARE_PROVIDER_SITE_OTHER): Payer: Medicare Other | Admitting: Internal Medicine

## 2015-11-04 ENCOUNTER — Encounter: Payer: Self-pay | Admitting: Internal Medicine

## 2015-11-04 VITALS — BP 128/80 | HR 72 | Ht 63.5 in | Wt 183.0 lb

## 2015-11-04 DIAGNOSIS — Z23 Encounter for immunization: Secondary | ICD-10-CM

## 2015-11-04 DIAGNOSIS — E1165 Type 2 diabetes mellitus with hyperglycemia: Secondary | ICD-10-CM

## 2015-11-04 DIAGNOSIS — E1142 Type 2 diabetes mellitus with diabetic polyneuropathy: Secondary | ICD-10-CM | POA: Diagnosis not present

## 2015-11-04 LAB — HEMOGLOBIN A1C: HEMOGLOBIN A1C: 7.6 % — AB (ref 4.6–6.5)

## 2015-11-04 MED ORDER — GLIMEPIRIDE 2 MG PO TABS: 2.0000 mg | ORAL_TABLET | Freq: Every day | ORAL | 3 refills | Status: DC

## 2015-11-04 NOTE — Progress Notes (Addendum)
Patient ID: Natalie Lane, female   DOB: Oct 29, 1945, 70 y.o.   MRN: [id number]  HPI: Natalie Lane is a 70 y.o.-year-old female, returning for f/u for DM2, dx in 2004, insulin-dependent since 2015, uncontrolled, with complications (PN). Last visit 3 mo ago. She has M'care part A and B. She has [employer] [facility] for drugs.  She quit smoking 2 weeks ago.  Last hemoglobin A1c was: Lab Results  Component Value Date   HGBA1C 7.3 08/08/2015   HGBA1C 7.2 04/29/2015   HGBA1C 7.5 12/20/2014  11/27/2013: 8.4%  Pt was on a regimen of: - Lantus 40 units qhs - Metformin XR 500 mg 2x a day - added 03/18/2014 - Jardiance 25 mg daily in am - added 03/18/2014  - Welchol 1 package a day  Now on: - Metformin XR 500 mg 2x a day with meals. - Jardiance 25 mg before breakfast >> has to stop b/c expensive! In the donut hole. - Humulin NPH 20 units in am and 20 units at bedtime Stopped Welchol b/c price. She tried Invokana, not covered.  We tried Glipizide XL >> rash, palpitations >> she had to stop it after a week.  She was on Metformin - taken off as she had cirrhosis. She had diarrhea with regular metformin. She was on Tradjenta 5 mg in am She was on Welchol 3x a week >> not taking it frequently  Pt checks her sugars 1-2x a day and they are (per log): - am: 110-216 >> 101, 141-179, 194 >> 88, 121-160, 185, 238 x1 >> 121-200 >> 118, 169-218 - 2h after b'fast: n/c >> 201, 207 >> n/c >> 140 >> 175-240 >> n/c >> 171 >> n/c - before lunch: n/c >> 134-168, 207x1 >> n/c >> 145 >> 142 >> 188 >> n/c >> 127-184 >> 135, 161-169, 217 - 2h after lunch: n/c >> 257 >> n/c >> 165 >> n/c >> 203 - before dinner: n/c >> 160, 189 >> 110-168 >> n/c >> 147 >> 188 >> n/c >> 135 - 2h after dinner: 348 x1 >> 137, 194, 206 >> n/c >> 152-339, 477 >> 207 >> n/c >> 174 >> 180 - bedtime: n/c >> 142, 203, 208 >> 145-202 >> n/c >> see above >> n/c >> 109-203 - nighttime: n/c No lows. Lowest sugar was 98 >> 88 >> 118; she has  hypoglycemia awareness at 70.  Highest sugar was 348 >> 257 >> 202 >> 198 >> 477 x1 >> 207 >> 238 (forgot insulin) >> 200 >> 217  Glucometer: One Touch Verio Flex  Pt's meals are: - Breakfast: oatmeal or [place] bacon or sausage + eggs - Lunch: chilli beans; vegetable or hamburger soup - Dinner: salad or bowl of cereals  She saw Antonieta Iba (nutrition).  - no CKD, last BUN/creatinine:  08/26/2015: 15/0.72 Lab Results  Component Value Date   BUN 21 06/19/2014   CREATININE 1.05 06/19/2014  ACR  - last set of lipids: 12/18/2014: 309/175/56/218 Lab Results  Component Value Date   CHOL 287 (A) 02/28/2014   HDL 62 02/28/2014   LDLCALC 196 02/28/2014   TRIG 148 02/28/2014  Stopped Welchol b/c cramps. Statins >> leg and foot cramps.  - last eye exam was in 09/2014. No DR.  - + numbness and tingling in her feet.  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 Goes back in 01/2015.  She also has a h/o cirrhosis. Latest LFTs reviewed: 10/23/2013: AST/ALT: 24/24 Lab Results  Component Value Date   ALT 23 09/01/2011   AST 21 09/01/2011   ALKPHOS 68 09/01/2011   BILITOT 0.4 09/01/2011   ROS: Constitutional: + weight gain, no fatigue, no subjective hyperthermia; no nocturia Eyes: no blurry vision, no xerophthalmia ENT: no sore throat, no nodules palpated in throat, no dysphagia/no odynophagia, no hoarseness Cardiovascular: no CP/SOB/palpitations/no leg swelling Respiratory: no cough/SOB/wheezing Gastrointestinal: no N/no V/+ D/no C/heartburn Musculoskeletal: +muscle aches/no joint aches Skin: no rash, no hair loss Neurological: no tremors/numbness/tingling/dizziness, no HA  I reviewed pt's medications, allergies, PMH, social hx, family hx, and changes were documented in the history of present illness. Otherwise, unchanged from my  initial visit note, exc. Stopped Lasix and started Meloxicam.  Past Medical History:  Diagnosis Date  . Anemia   . Atrial flutter ([place])   . 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   . Cirrhosis ([place])   . Complication of anesthesia   . Diabetes mellitus, type 2 ([place])   . Dysrhythmia    AFlutter  . Edema   . GERD (gastroesophageal reflux disease)   . History of blood transfusion   . Hyperlipidemia   . Normal stress echocardiogram 2008  . PONV (postoperative nausea and vomiting)    with Cholecystectomy   Past Surgical History:  Procedure Laterality Date  . ABDOMINAL HYSTERECTOMY    . ASD versus VSD repair  1978   Operative notes not available.  . ATRIAL ABLATION SURGERY  10 /2011  . [place]   x2  . CHOLECYSTECTOMY    . CORONARY ARTERY BYPASS GRAFT  1978  . ESOPHAGOGASTRODUODENOSCOPY (EGD) WITH PROPOFOL N/A 11/16/2013   Procedure: ESOPHAGOGASTRODUODENOSCOPY (EGD) WITH PROPOFOL;  Surgeon: Lear Ng, MD;  Location: [place];  Service: Endoscopy;  Laterality: N/A;  . ESOPHAGOGASTRODUODENOSCOPY (EGD) WITH PROPOFOL N/A 01/23/2014   Procedure: ESOPHAGOGASTRODUODENOSCOPY (EGD) WITH PROPOFOL;  Surgeon: Lear Ng, MD;  Location: [place];  Service: Endoscopy;  Laterality: N/A;  . GASTRIC VARICES BANDING N/A 11/16/2013   Procedure: GASTRIC VARICES BANDING;  Surgeon: Lear Ng, MD;  Location: [place];  Service: Endoscopy;  Laterality: N/A;  . GASTRIC VARICES BANDING N/A 01/23/2014   Procedure: GASTRIC VARICES BANDING;  Surgeon: Lear Ng, MD;  Location: [place];  Service: Endoscopy;  Laterality: N/A;  . HERNIA REPAIR    . OTHER SURGICAL HISTORY     partial hysterectomy   History   Social History  . Marital Status: Married    Spouse Name: N/A    Number of Children: 3   Occupational History  . retired    Social History Main Topics  . Smoking status: Current  Every Day Smoker -- 0.50 packs/day for 36 years    Types: Cigarettes  . Smokeless tobacco: Never Used  . Alcohol Use: No  . Drug Use: No   Current Outpatient Prescriptions on File Prior to Visit  Medication Sig Dispense Refill  . diphenhydrAMINE (BENADRYL) 25 MG tablet Take 25 mg by mouth every 6 (six) hours as needed for allergies.    Mariane Baumgarten Calcium (STOOL SOFTENER PO) Take 2 tablets by mouth daily as needed (for constipation).    Marland Kitchen glucose blood (ONETOUCH VERIO) test strip Use to test blood sugar 2 times daily as instructed. Dx: E11.42, E11.65 100  each 4  . ibuprofen (ADVIL,MOTRIN) 200 MG tablet Take 200 mg by mouth every 6 (six) hours as needed for headache or moderate pain (arthritis).    . insulin NPH Human (HUMULIN N,NOVOLIN N) 100 UNIT/ML injection Inject 0.2 mLs (20 Units total) into the skin 2 (two) times daily before a meal. ReliOn brand 20 mL 11  . Insulin Pen Needle 32G X 4 MM MISC Use to inject insulin 2 times daily. 80 each 2  . Insulin Syringe-Needle U-100 (RELION INSULIN SYRINGE) 31G X 15/64" 0.3 ML MISC Use 2x a day 100 each 11  . loperamide (IMODIUM A-D) 2 MG tablet Take 2 mg by mouth 4 (four) times daily as needed for diarrhea or loose stools.    . metFORMIN (GLUCOPHAGE-XR) 500 MG 24 hr tablet TAKE ONE TABLET BY MOUTH TWICE DAILY WITH MEALS 60 tablet 0  . ONETOUCH DELICA LANCETS FINE MISC Use to test blood sugar 2 times daily as instructed. Dx: E11.42, E11.65 100 each 4  . pantoprazole (PROTONIX) 40 MG tablet Take 40 mg by mouth daily.    . propranolol (INDERAL) 20 MG tablet Take 1 tablet (20 mg total) by mouth 2 (two) times daily. 60 tablet 11  . cholecalciferol (VITAMIN D) 1000 units tablet Take 1,000 Units by mouth daily.    Marland Kitchen JARDIANCE 25 MG TABS tablet TAKE ONE TABLET BY MOUTH ONCE DAILY (Patient not taking: Reported on 11/04/2015) 30 tablet 2   No current facility-administered medications on file prior to visit.    Allergies  Allergen Reactions  . Other  Anaphylaxis    MSG  . Red Dye Anaphylaxis  . Erythromycin Other (See Comments)    migraine  . Penicillins Other (See Comments)    headaches  . Statins   . Sulfa Antibiotics Hives  . Glipizide Rash  . Iohexol Rash   Family History  Problem Relation Age of Onset  . Kidney disease Sister   . Depression Paternal Grandfather   . Diabetes Brother   . Hypertension Mother    PE: BP 128/80 (BP Location: Left Arm, Patient Position: Sitting)   Pulse 72   Ht 5' 3.5" (1.613 m)   Wt 183 lb (83 kg)   SpO2 97%   BMI 31.91 kg/m  Body mass index is 31.91 kg/m. On repeat BP: 120/68.  Wt Readings from Last 3 Encounters:  11/04/15 183 lb (83 kg)  08/08/15 181 lb (82.1 kg)  08/06/15 182 lb 12.8 oz (82.9 kg)   Constitutional: overweight, in NAD Eyes: PERRLA, EOMI, no exophthalmos ENT: moist mucous membranes, no thyromegaly, no cervical lymphadenopathy Cardiovascular: RRR,+2/6SEM, no RG, mild periankle edema (wears compression socks) Respiratory: CTA B Gastrointestinal: abdomen soft, NT, ND, BS+ Musculoskeletal: no deformities, strength intact in all 4 Skin: moist, warm, no rashes Neurological: no tremor with outstretched hands, DTR normal in all 4  ASSESSMENT: 1. DM2, insulin-dependent, uncontrolled, with complications - PN  2. HL  PLAN:  1. Patient with long-standing, uncontrolled diabetes, on oral antidiabetic regimen + basal insulin >> sugars higher. She will have to come off Jardiance (8$ a tab) as she is in the donut hole. Will try Amaryl. - I suggested:  Patient Instructions  Please continue: - Metformin XR 500 mg 2x a day with meals. - NPH 20 units in am and 20 units after dinner.  Stop Jardiance.   Start: - Amaryl (Glimepiride) 2 mg 2x a day, before b'fast and dinner.  Please return in 1.5 months with your sugar log.  Please  stop at the lab.  - continue checking sugars at different times of the day - check 1-2 times a day, rotating checks - advised for yearly eye  exams >> she needs one - checked HbA1c today  - Return to clinic in 1.5 mo with sugar log   2. HL - reviewed Lipids >> very high LDL >> cannot tolerate statins, cannot afford other meds. I suggested Benecol spread.  Office Visit on 11/04/2015  Component Date Value Ref Range Status  . Hgb A1c MFr Bld 11/04/2015 7.6* 4.6 - 6.5 % Final   HbA1c higher, as expected. Philemon Kingdom, MD PhD [facility]

## 2015-11-04 NOTE — Patient Instructions (Addendum)
Please continue: - Metformin XR 500 mg 2x a day with meals. - NPH 20 units in am and 20 units after dinner.  Stop Jardiance.   Start: - Amaryl (Glimepiride) 2 mg 2x a day, before b'fast and dinner.  Please return in 1.5 months with your sugar log.  Please stop at the lab.

## 2015-11-05 ENCOUNTER — Telehealth: Payer: Self-pay

## 2015-11-05 NOTE — Telephone Encounter (Signed)
Called patient and gave lab results. Patient had no questions or concerns.  

## 2015-11-14 ENCOUNTER — Other Ambulatory Visit: Payer: Self-pay | Admitting: Internal Medicine

## 2015-12-09 ENCOUNTER — Other Ambulatory Visit: Payer: Self-pay | Admitting: Internal Medicine

## 2015-12-11 DIAGNOSIS — J029 Acute pharyngitis, unspecified: Secondary | ICD-10-CM | POA: Diagnosis not present

## 2015-12-11 DIAGNOSIS — J0101 Acute recurrent maxillary sinusitis: Secondary | ICD-10-CM | POA: Diagnosis not present

## 2015-12-17 ENCOUNTER — Ambulatory Visit (INDEPENDENT_AMBULATORY_CARE_PROVIDER_SITE_OTHER): Payer: Medicare Other | Admitting: Internal Medicine

## 2015-12-17 ENCOUNTER — Encounter: Payer: Self-pay | Admitting: Internal Medicine

## 2015-12-17 VITALS — BP 132/74 | HR 78 | Ht 63.5 in | Wt 187.0 lb

## 2015-12-17 DIAGNOSIS — E1142 Type 2 diabetes mellitus with diabetic polyneuropathy: Secondary | ICD-10-CM

## 2015-12-17 DIAGNOSIS — E1165 Type 2 diabetes mellitus with hyperglycemia: Secondary | ICD-10-CM | POA: Diagnosis not present

## 2015-12-17 MED ORDER — GLIMEPIRIDE 2 MG PO TABS: 2.0000 mg | ORAL_TABLET | Freq: Two times a day (BID) | ORAL | 3 refills | Status: DC

## 2015-12-17 NOTE — Patient Instructions (Addendum)
Please continue: - Metformin XR 500 mg 2x a day with meals.  Please increase: - Amaryl (Glimepiride) 2 mg 2x a day, before b'fast and dinner. If sugars are still high after meals, and you have no lows, you can increase to 4 mg 2x a day.  Please check morning sugars fasting and also check some sugars at bedtime.  Please return in 1.5 months with your sugar log.

## 2015-12-17 NOTE — Progress Notes (Signed)
Patient ID: Natalie Lane, female   DOB: [birth year], 70 y.o.   MRN: [id number]  HPI: Natalie Lane is a 70 y.o.-year-old female, returning for f/u for DM2, dx in 2004, insulin-dependent since 2015, uncontrolled, with complications (PN). Last visit  1.5 mo ago. She has M'care part A and B. She has [facility] for drugs.  She recently had sinusitis + otitis >> was on Cefuroxime.  Last hemoglobin A1c was: Lab Results  Component Value Date   HGBA1C 7.6 (H) 11/04/2015   HGBA1C 7.3 08/08/2015   HGBA1C 7.2 04/29/2015  11/27/2013: 8.4%  Pt was on a regimen of: - Lantus 40 units qhs - Metformin XR 500 mg 2x a day - added 03/18/2014 - Jardiance 25 mg daily in am - added 03/18/2014  - Welchol 1 package a day  Now on: - Metformin XR 500 mg 2x a day with meals. - Amaryl 2 mg before b'fast (not 2x a day as advised) - Humulin NPH 20 units in am and 20 units at bedtime  Stopped Welchol b/c price. She tried Invokana, not covered. She was on Jardiance 25 mg before breakfast >> had to stop b/c expensive! In the donut hole. We tried Glipizide XL >> rash, palpitations >> she had to stop it after a week.  She was on Metformin - taken off as she had cirrhosis. She had diarrhea with regular metformin. She was on Tradjenta 5 mg in am She was on Welchol 3x a week >> not taking it frequently  Pt checks her sugars 1-2x a day and they are (per log): - am: 110-216 >> 101, 141-179, 194 >> 88, 121-160, 185, 238 x1 >> 121-200 >> 118, 169-218 >> 158-219, 233 (after 2x coffee + creamer!!!) - 2h after b'fast: n/c >> 201, 207 >> n/c >> 140 >> 175-240 >> n/c >> 171 >> n/c - before lunch: n/c >> 134-168, 207x1 >> n/c >> 145 >> 142 >> 188 >> n/c >> 127-184 >> 135, 161-169, 217 >> 164-170, 223 - 2h after lunch: n/c >> 257 >> n/c >> 165 >> n/c >> 203 - before dinner: n/c >> 160, 189 >> 110-168 >> n/c >> 147 >> 188 >> n/c >> 135 - 2h after dinner: 348 x1 >> 137, 194, 206 >> n/c >> 152-339, 477 >> 207 >> n/c >>  174 >> 180 - bedtime: n/c >> 142, 203, 208 >> 145-202 >> n/c >> see above >> n/c >> 109-203 - nighttime: n/c No lows. Lowest sugar was 98 >> 88 >> 118 >> 158; she has hypoglycemia awareness at 70.  Highest sugar was 233  Glucometer: One Touch Verio Flex  Pt's meals are: - Breakfast: oatmeal or [place] bacon or sausage + eggs - Lunch: chilli beans; vegetable or hamburger soup - Dinner: salad or bowl of cereals  She saw Antonieta Iba (nutrition).  - no CKD, last BUN/creatinine:  08/26/2015: 15/0.72 Lab Results  Component Value Date   BUN 21 06/19/2014   CREATININE 1.05 06/19/2014  ACR  - last set of lipids: 12/18/2014: 309/175/56/218 Lab Results  Component Value Date   CHOL 287 (A) 02/28/2014   HDL 62 02/28/2014   LDLCALC 196 02/28/2014   TRIG 148 02/28/2014  Stopped Welchol b/c cramps. Statins >> leg and foot cramps.  - last eye exam was in 09/2014. No DR.  - + numbness and tingling in her feet.  She also has a h/o cirrhosis.   ROS: Constitutional: + weight gain, no fatigue, no subjective hyperthermia;  no nocturia Eyes: no blurry vision, no xerophthalmia ENT: no sore throat, no nodules palpated in throat, no dysphagia/no odynophagia, no hoarseness Cardiovascular: no CP/SOB/+ palpitations/+ leg swelling Respiratory: no cough/SOB/wheezing Gastrointestinal: no N/no V/+ D/no C/heartburn Musculoskeletal: +muscle aches/no joint aches Skin: no rash, + hair loss Neurological: no tremors/numbness/tingling/dizziness,+ HA  I reviewed pt's medications, allergies, PMH, social hx, family hx, and changes were documented in the history of present illness. Otherwise, unchanged from my initial visit note, exc. Stopped Lasix and started Meloxicam.  Past Medical History:  Diagnosis Date  . Anemia   . Atrial flutter ([place])   . 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   . Cirrhosis ([place])   . Complication of anesthesia   . Diabetes  mellitus, type 2 ([place])   . Dysrhythmia    AFlutter  . Edema   . GERD (gastroesophageal reflux disease)   . History of blood transfusion   . Hyperlipidemia   . Normal stress echocardiogram 2008  . PONV (postoperative nausea and vomiting)    with Cholecystectomy   Past Surgical History:  Procedure Laterality Date  . ABDOMINAL HYSTERECTOMY    . ASD versus VSD repair  1978   Operative notes not available.  . ATRIAL ABLATION SURGERY  10 /2011  . [place]   x2  . CHOLECYSTECTOMY    . CORONARY ARTERY BYPASS GRAFT  1978  . ESOPHAGOGASTRODUODENOSCOPY (EGD) WITH PROPOFOL N/A 11/16/2013   Procedure: ESOPHAGOGASTRODUODENOSCOPY (EGD) WITH PROPOFOL;  Surgeon: Lear Ng, MD;  Location: [place];  Service: Endoscopy;  Laterality: N/A;  . ESOPHAGOGASTRODUODENOSCOPY (EGD) WITH PROPOFOL N/A 01/23/2014   Procedure: ESOPHAGOGASTRODUODENOSCOPY (EGD) WITH PROPOFOL;  Surgeon: Lear Ng, MD;  Location: [facility];  Service: Endoscopy;  Laterality: N/A;  . GASTRIC VARICES BANDING N/A 11/16/2013   Procedure: GASTRIC VARICES BANDING;  Surgeon: Lear Ng, MD;  Location: [place];  Service: Endoscopy;  Laterality: N/A;  . GASTRIC VARICES BANDING N/A 01/23/2014   Procedure: GASTRIC VARICES BANDING;  Surgeon: Lear Ng, MD;  Location: [place];  Service: Endoscopy;  Laterality: N/A;  . HERNIA REPAIR    . OTHER SURGICAL HISTORY     partial hysterectomy   History   Social History  . Marital Status: Married    Spouse Name: N/A    Number of Children: 3   Occupational History  . retired    Social History Main Topics  . Smoking status: Current Every Day Smoker -- 0.50 packs/day for 36 years    Types: Cigarettes  . Smokeless tobacco: Never Used  . Alcohol Use: No  . Drug Use: No   Current Outpatient Prescriptions on File Prior to Visit  Medication Sig Dispense Refill  . cholecalciferol (VITAMIN D) 1000 units tablet Take 1,000 Units by mouth  daily.    . diphenhydrAMINE (BENADRYL) 25 MG tablet Take 25 mg by mouth every 6 (six) hours as needed for allergies.    Mariane Baumgarten Calcium (STOOL SOFTENER PO) Take 2 tablets by mouth daily as needed (for constipation).    Marland Kitchen glimepiride (AMARYL) 2 MG tablet Take 1 tablet (2 mg total) by mouth daily with breakfast. 180 tablet 3  . glucose blood (ONETOUCH VERIO) test strip Use to test blood sugar 2 times daily as instructed. Dx: E11.42, E11.65 100 each 4  . ibuprofen (ADVIL,MOTRIN) 200 MG tablet Take 200 mg by mouth every 6 (six) hours as needed for headache or  moderate pain (arthritis).    . insulin NPH Human (HUMULIN N,NOVOLIN N) 100 UNIT/ML injection Inject 0.2 mLs (20 Units total) into the skin 2 (two) times daily before a meal. ReliOn brand 20 mL 11  . Insulin Pen Needle 32G X 4 MM MISC Use to inject insulin 2 times daily. 80 each 2  . Insulin Syringe-Needle U-100 (RELION INSULIN SYRINGE) 31G X 15/64" 0.3 ML MISC Use 2x a day 100 each 11  . loperamide (IMODIUM A-D) 2 MG tablet Take 2 mg by mouth 4 (four) times daily as needed for diarrhea or loose stools.    . metFORMIN (GLUCOPHAGE-XR) 500 MG 24 hr tablet TAKE ONE TABLET BY MOUTH TWICE DAILY WITH MEALS 60 tablet 0  . ONETOUCH DELICA LANCETS FINE MISC Use to test blood sugar 2 times daily as instructed. Dx: E11.42, E11.65 100 each 4  . pantoprazole (PROTONIX) 40 MG tablet Take 40 mg by mouth daily.    . propranolol (INDERAL) 20 MG tablet Take 1 tablet (20 mg total) by mouth 2 (two) times daily. 60 tablet 11  . JARDIANCE 25 MG TABS tablet TAKE ONE TABLET BY MOUTH ONCE DAILY (Patient not taking: Reported on 12/17/2015) 30 tablet 2   No current facility-administered medications on file prior to visit.    Allergies  Allergen Reactions  . Other Anaphylaxis    MSG  . Red Dye Anaphylaxis  . Erythromycin Other (See Comments)    migraine  . Penicillins Other (See Comments)    headaches  . Statins   . Sulfa Antibiotics Hives  . Glipizide Rash  .  Iohexol Rash   Family History  Problem Relation Age of Onset  . Kidney disease Sister   . Depression Paternal Grandfather   . Diabetes Brother   . Hypertension Mother    PE: BP 132/74 (BP Location: Left Arm, Patient Position: Sitting)   Pulse 78   Ht 5' 3.5" (1.613 m)   Wt 187 lb (84.8 kg)   SpO2 98%   BMI 32.61 kg/m  Body mass index is 32.61 kg/m. On repeat BP: 120/68.  Wt Readings from Last 3 Encounters:  12/17/15 187 lb (84.8 kg)  11/04/15 183 lb (83 kg)  08/08/15 181 lb (82.1 kg)   Constitutional: overweight, in NAD Eyes: PERRLA, EOMI, no exophthalmos ENT: moist mucous membranes, no thyromegaly, no cervical lymphadenopathy Cardiovascular: RRR,+2/6SEM, no RG, mild periankle edema (wears compression socks) Respiratory: CTA B Gastrointestinal: abdomen soft, NT, ND, BS+ Musculoskeletal: no deformities, strength intact in all 4 Skin: moist, warm, no rashes Neurological: no tremor with outstretched hands, DTR normal in all 4  ASSESSMENT: 1. DM2, insulin-dependent, uncontrolled, with complications - PN  PLAN:  1. Patient with long-standing, uncontrolled diabetes, on oral antidiabetic regimen + intermediate-acting insulin. At last visit, as she could not affordJardiance any more (doughnut hole) >> we switched to Amaryl 2x a day. She was taking it only in am, though >> sugars still high. Will increase to 2x a day. She also tells me that her am sugars are actually checked after her coffee + creamer >> advised to start checking fasting. She also needs to start checking some sugars in the evening. - I suggested:  Patient Instructions  Please continue: - Metformin XR 500 mg 2x a day with meals.  Please increase: - Amaryl (Glimepiride) 2 mg 2x a day, before b'fast and dinner. If sugars are still high after meals, and you have no lows, you can increase to 4 mg 2x a day.  Please check morning sugars fasting and also check some sugars at bedtime.  Please return in 1.5 months with  your sugar log.  - continue checking sugars at different times of the day - check 1-2 times a day, rotating checks - advised for yearly eye exams >> she needs one - checked HbA1c at next visit  - she is UTD with flu shot  - Return to clinic in 1.5 mo with sugar log   Philemon Kingdom, MD PhD [facility] Endocrinology

## 2016-01-06 ENCOUNTER — Other Ambulatory Visit: Payer: Self-pay | Admitting: Internal Medicine

## 2016-01-11 ENCOUNTER — Other Ambulatory Visit: Payer: Self-pay | Admitting: Internal Medicine

## 2016-01-28 ENCOUNTER — Encounter: Payer: Self-pay | Admitting: Internal Medicine

## 2016-01-28 ENCOUNTER — Ambulatory Visit (INDEPENDENT_AMBULATORY_CARE_PROVIDER_SITE_OTHER): Payer: Medicare Other | Admitting: Internal Medicine

## 2016-01-28 VITALS — BP 112/60 | HR 119 | Ht 63.5 in | Wt 189.0 lb

## 2016-01-28 DIAGNOSIS — E1165 Type 2 diabetes mellitus with hyperglycemia: Secondary | ICD-10-CM

## 2016-01-28 DIAGNOSIS — E1142 Type 2 diabetes mellitus with diabetic polyneuropathy: Secondary | ICD-10-CM | POA: Diagnosis not present

## 2016-01-28 LAB — POCT GLYCOSYLATED HEMOGLOBIN (HGB A1C): Hemoglobin A1C: 7.2

## 2016-01-28 MED ORDER — METFORMIN HCL ER 500 MG PO TB24: 500.0000 mg | ORAL_TABLET | Freq: Two times a day (BID) | ORAL | 3 refills | Status: DC

## 2016-01-28 MED ORDER — INSULIN NPH (HUMAN) (ISOPHANE) 100 UNIT/ML ~~LOC~~ SUSP: SUBCUTANEOUS | 11 refills | Status: DC

## 2016-01-28 NOTE — Progress Notes (Signed)
Patient ID: Natalie Lane, female   DOB: 08-09-1945, 70 y.o.   MRN: [id number]  HPI: Natalie Lane is a 70 y.o.-year-old female, returning for f/u for DM2, dx in 2004, insulin-dependent since 2015, uncontrolled, with complications (PN). Last visit  1.5 mo ago. She has M'care part A and B. She has [employer] [facility] for drugs.  Last hemoglobin A1c was: Lab Results  Component Value Date   HGBA1C 7.2 01/28/2016   HGBA1C 7.6 (H) 11/04/2015   HGBA1C 7.3 08/08/2015  11/27/2013: 8.4%  Pt was on a regimen of: - Lantus 40 units qhs - Metformin XR 500 mg 2x a day - added 03/18/2014 - Jardiance 25 mg daily in am - added 03/18/2014  - Welchol 1 package a day  Now on: - Metformin XR 500 mg 2x a day with meals. - Amaryl 2 mg 2x a day before meals - Humulin NPH 20 units in am and 20 units at bedtime  Stopped Welchol b/c price. She tried Invokana, not covered. She was on Jardiance 25 mg before breakfast >> had to stop b/c expensive! In the donut hole. We tried Glipizide XL >> rash, palpitations >> she had to stop it after a week.  She was on Metformin - taken off as she had cirrhosis. She had diarrhea with regular metformin. She was on Tradjenta 5 mg in am She was on Welchol 3x a week >> not taking it frequently She tried [place] >> too expensive.  Pt checks her sugars 1-2x a day and they are (per log): - am: 121-200 >> 118, 169-218 >> 158-219, 233 (after 2x coffee + creamer!!!) >> 99-134, 166, 188 - 2h after b'fast: n/c >> 201, 207 >> n/c >> 140 >> 175-240 >> n/c >> 171 >> n/c - before lunch:  142 >> 188 >> n/c >> 127-184 >> 135, 161-169, 217 >> 164-170, 223 >> 181 - 2h after lunch: n/c >> 257 >> n/c >> 165 >> n/c >> 203 >> n/c - before dinner: n/c >> 160, 189 >> 110-168 >> n/c >> 147 >> 188 >> n/c >> 135 >> 168-181 - 2h after dinner: 348 x1 >> 137, 194, 206 >> n/c >> 152-339, 477 >> 207 >> n/c >> 174 >> 180 >> n/c - bedtime: n/c >> 142, 203, 208 >> 145-202 >> n/c >> see above >> n/c >>  109-203 >> 108-178 - nighttime: n/c No lows. Lowest sugar was 98 >> 88 >> 118 >> 158; she has hypoglycemia awareness at 70.  Highest sugar was 233  Glucometer: One Touch Verio Flex  Pt's meals are: - Breakfast: oatmeal or [place] bacon or sausage + eggs - Lunch: chilli beans; vegetable or hamburger soup - Dinner: salad or bowl of cereals  She saw Antonieta Iba (nutrition).  - no CKD, last BUN/creatinine:  08/26/2015: 15/0.72 Lab Results  Component Value Date   BUN 21 06/19/2014   CREATININE 1.05 06/19/2014  ACR  - last set of lipids: 12/18/2014: 309/175/56/218 Lab Results  Component Value Date   CHOL 287 (A) 02/28/2014   HDL 62 02/28/2014   LDLCALC 196 02/28/2014   TRIG 148 02/28/2014  Stopped Welchol b/c cramps. Statins >> leg and foot cramps.  - last eye exam was in 09/2014. No DR.  - + numbness and tingling in her feet.  She also has a h/o cirrhosis.   ROS: Constitutional: + weight gain, + fatigue, no subjective hyperthermia; no nocturia Eyes: no blurry vision, no xerophthalmia ENT: no sore throat, no nodules  palpated in throat, no dysphagia/no odynophagia, no hoarseness Cardiovascular: no CP/+ SOB/+ palpitations/+ leg swelling Respiratory: no cough/+ SOB with exertion/no wheezing Gastrointestinal: no N/V/D/C/heartburn Musculoskeletal: + muscle aches/+ joint aches Skin: no rash, + hair loss Neurological: no tremors/numbness/tingling/dizziness,+ HA  I reviewed pt's medications, allergies, PMH, social hx, family hx, and changes were documented in the history of present illness. Otherwise, unchanged from my initial visit note, exc. Stopped Lasix and started Meloxicam.  Past Medical History:  Diagnosis Date  . Anemia   . Atrial flutter ([place])   . 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   . Cirrhosis ([place])   . Complication of anesthesia   . Diabetes mellitus, type 2 ([place])   . Dysrhythmia    AFlutter  .  Edema   . GERD (gastroesophageal reflux disease)   . History of blood transfusion   . Hyperlipidemia   . Normal stress echocardiogram 2008  . PONV (postoperative nausea and vomiting)    with Cholecystectomy   Past Surgical History:  Procedure Laterality Date  . ABDOMINAL HYSTERECTOMY    . ASD versus VSD repair  1978   Operative notes not available.  . ATRIAL ABLATION SURGERY  10 /2011  . [place]   x2  . CHOLECYSTECTOMY    . CORONARY ARTERY BYPASS GRAFT  1978  . ESOPHAGOGASTRODUODENOSCOPY (EGD) WITH PROPOFOL N/A 11/16/2013   Procedure: ESOPHAGOGASTRODUODENOSCOPY (EGD) WITH PROPOFOL;  Surgeon: Lear Ng, MD;  Location: [place] [place];  Service: Endoscopy;  Laterality: N/A;  . ESOPHAGOGASTRODUODENOSCOPY (EGD) WITH PROPOFOL N/A 01/23/2014   Procedure: ESOPHAGOGASTRODUODENOSCOPY (EGD) WITH PROPOFOL;  Surgeon: Lear Ng, MD;  Location: [place];  Service: Endoscopy;  Laterality: N/A;  . GASTRIC VARICES BANDING N/A 11/16/2013   Procedure: GASTRIC VARICES BANDING;  Surgeon: Lear Ng, MD;  Location: [place];  Service: Endoscopy;  Laterality: N/A;  . GASTRIC VARICES BANDING N/A 01/23/2014   Procedure: GASTRIC VARICES BANDING;  Surgeon: Lear Ng, MD;  Location: [place];  Service: Endoscopy;  Laterality: N/A;  . HERNIA REPAIR    . OTHER SURGICAL HISTORY     partial hysterectomy   History   Social History  . Marital Status: Married    Spouse Name: N/A    Number of Children: 3   Occupational History  . retired    Social History Main Topics  . Smoking status: Current Every Day Smoker -- 0.50 packs/day for 36 years    Types: Cigarettes  . Smokeless tobacco: Never Used  . Alcohol Use: No  . Drug Use: No   Current Outpatient Prescriptions on File Prior to Visit  Medication Sig Dispense Refill  . cholecalciferol (VITAMIN D) 1000 units tablet Take 1,000 Units by mouth daily.    . diphenhydrAMINE (BENADRYL) 25 MG tablet Take 25  mg by mouth every 6 (six) hours as needed for allergies.    Mariane Baumgarten Calcium (STOOL SOFTENER PO) Take 2 tablets by mouth daily as needed (for constipation).    Marland Kitchen glimepiride (AMARYL) 2 MG tablet Take 1 tablet (2 mg total) by mouth 2 (two) times daily before a meal. 180 tablet 3  . ibuprofen (ADVIL,MOTRIN) 200 MG tablet Take 200 mg by mouth every 6 (six) hours as needed for headache or moderate pain (arthritis).    . insulin NPH Human (HUMULIN N,NOVOLIN N) 100 UNIT/ML injection Inject 0.2 mLs (20 Units total) into the skin 2 (two) times daily before a  meal. ReliOn brand 20 mL 11  . Insulin Pen Needle 32G X 4 MM MISC Use to inject insulin 2 times daily. 80 each 2  . Insulin Syringe-Needle U-100 (RELION INSULIN SYRINGE) 31G X 15/64" 0.3 ML MISC Use 2x a day 100 each 11  . loperamide (IMODIUM A-D) 2 MG tablet Take 2 mg by mouth 4 (four) times daily as needed for diarrhea or loose stools.    . metFORMIN (GLUCOPHAGE-XR) 500 MG 24 hr tablet TAKE ONE TABLET BY MOUTH TWICE DAILY WITH MEALS 60 tablet 0  . ONETOUCH DELICA LANCETS FINE MISC Use to test blood sugar 2 times daily as instructed. Dx: E11.42, E11.65 100 each 4  . ONETOUCH VERIO test strip USE  STRIP TO CHECK GLUCOSE TWICE DAILY AS  INSTRUCTED 100 each 4  . pantoprazole (PROTONIX) 40 MG tablet Take 40 mg by mouth daily.    . propranolol (INDERAL) 20 MG tablet Take 1 tablet (20 mg total) by mouth 2 (two) times daily. 60 tablet 11  . JARDIANCE 25 MG TABS tablet TAKE ONE TABLET BY MOUTH ONCE DAILY (Patient not taking: Reported on 01/28/2016) 30 tablet 2   No current facility-administered medications on file prior to visit.    Allergies  Allergen Reactions  . Other Anaphylaxis    MSG  . Red Dye Anaphylaxis  . Erythromycin Other (See Comments)    migraine  . Penicillins Other (See Comments)    headaches  . Statins   . Sulfa Antibiotics Hives  . Glipizide Rash  . Iohexol Rash   Family History  Problem Relation Age of Onset  . Kidney  disease Sister   . Depression Paternal Grandfather   . Diabetes Brother   . Hypertension Mother    PE: BP 112/60 (BP Location: Right Arm, Patient Position: Sitting, Cuff Size: Large)   Pulse (!) 119   Ht 5' 3.5" (1.613 m)   Wt 189 lb (85.7 kg)   SpO2 98%   BMI 32.95 kg/m  Body mass index is 32.95 kg/m.   Wt Readings from Last 3 Encounters:  01/28/16 189 lb (85.7 kg)  12/17/15 187 lb (84.8 kg)  11/04/15 183 lb (83 kg)   Constitutional: overweight, in NAD Eyes: PERRLA, EOMI, no exophthalmos ENT: moist mucous membranes, no thyromegaly, no cervical lymphadenopathy Cardiovascular: tachycardia, RR,+2/6SEM, no RG, + periankle edema (wears compression socks) Respiratory: CTA B Gastrointestinal: abdomen soft, NT, ND, BS+ Musculoskeletal: no deformities, strength intact in all 4 Skin: moist, warm, no rashes Neurological: no tremor with outstretched hands, DTR normal in all 4  ASSESSMENT: 1. DM2, insulin-dependent, uncontrolled, with complications - PN  PLAN:  1. Patient with long-standing, uncontrolled diabetes, on oral antidiabetic regimen + intermediate-acting insulin. This summer, as she could not afford Jardiance any more (doughnut hole) >> we switched to Amaryl 2x a day. We increased this to 2x a day at last visit. Sugars are better, but still high before meals >> will increase am NPH. - I suggested:  Patient Instructions  Please continue: - Metformin XR 500 mg 2x a day with meals. - Amaryl (Glimepiride) 2 mg 2x a day, before b'fast and dinner.  Please increase NPH to 25 units in am and 20 units at bedtime.  Please return in 3 months with your sugar log.  - continue checking sugars at different times of the day - check 1-2 times a day, rotating checks - advised for yearly eye exams >> she needs one >> advised to schedule - checked HbA1c today >>  7.2% (better) - she is UTD with flu shot  - Return to clinic in 1.5 mo with sugar log   Philemon Kingdom, MD PhD [facility]  Endocrinology

## 2016-01-28 NOTE — Patient Instructions (Addendum)
Please continue: - Metformin XR 500 mg 2x a day with meals. - Amaryl (Glimepiride) 2 mg 2x a day, before b'fast and dinner.  Please increase NPH to 25 units in am and 20 units at bedtime.  Please return in 3 months with your sugar log.

## 2016-02-13 DIAGNOSIS — R609 Edema, unspecified: Secondary | ICD-10-CM | POA: Diagnosis not present

## 2016-02-13 DIAGNOSIS — M6780 Other specified disorders of synovium and tendon, unspecified site: Secondary | ICD-10-CM | POA: Diagnosis not present

## 2016-02-26 ENCOUNTER — Telehealth: Payer: Self-pay | Admitting: Internal Medicine

## 2016-02-26 ENCOUNTER — Other Ambulatory Visit: Payer: Self-pay

## 2016-02-26 DIAGNOSIS — I499 Cardiac arrhythmia, unspecified: Secondary | ICD-10-CM | POA: Diagnosis not present

## 2016-02-26 DIAGNOSIS — Z7984 Long term (current) use of oral hypoglycemic drugs: Secondary | ICD-10-CM | POA: Diagnosis not present

## 2016-02-26 DIAGNOSIS — Z882 Allergy status to sulfonamides status: Secondary | ICD-10-CM | POA: Diagnosis not present

## 2016-02-26 DIAGNOSIS — Z9049 Acquired absence of other specified parts of digestive tract: Secondary | ICD-10-CM | POA: Diagnosis not present

## 2016-02-26 DIAGNOSIS — Z880 Allergy status to penicillin: Secondary | ICD-10-CM | POA: Diagnosis not present

## 2016-02-26 DIAGNOSIS — Z79899 Other long term (current) drug therapy: Secondary | ICD-10-CM | POA: Diagnosis not present

## 2016-02-26 DIAGNOSIS — K3189 Other diseases of stomach and duodenum: Secondary | ICD-10-CM | POA: Diagnosis not present

## 2016-02-26 DIAGNOSIS — Z91048 Other nonmedicinal substance allergy status: Secondary | ICD-10-CM | POA: Diagnosis not present

## 2016-02-26 DIAGNOSIS — E119 Type 2 diabetes mellitus without complications: Secondary | ICD-10-CM | POA: Diagnosis not present

## 2016-02-26 DIAGNOSIS — Z86010 Personal history of colonic polyps: Secondary | ICD-10-CM | POA: Diagnosis not present

## 2016-02-26 DIAGNOSIS — K766 Portal hypertension: Secondary | ICD-10-CM | POA: Diagnosis not present

## 2016-02-26 DIAGNOSIS — I8500 Esophageal varices without bleeding: Secondary | ICD-10-CM | POA: Diagnosis not present

## 2016-02-26 DIAGNOSIS — Z09 Encounter for follow-up examination after completed treatment for conditions other than malignant neoplasm: Secondary | ICD-10-CM | POA: Diagnosis not present

## 2016-02-26 DIAGNOSIS — Z888 Allergy status to other drugs, medicaments and biological substances status: Secondary | ICD-10-CM | POA: Diagnosis not present

## 2016-02-26 DIAGNOSIS — Z9089 Acquired absence of other organs: Secondary | ICD-10-CM | POA: Diagnosis not present

## 2016-02-26 DIAGNOSIS — I8510 Secondary esophageal varices without bleeding: Secondary | ICD-10-CM | POA: Diagnosis not present

## 2016-02-26 DIAGNOSIS — Z9102 Food additives allergy status: Secondary | ICD-10-CM | POA: Diagnosis not present

## 2016-02-26 DIAGNOSIS — F17200 Nicotine dependence, unspecified, uncomplicated: Secondary | ICD-10-CM | POA: Diagnosis not present

## 2016-02-26 DIAGNOSIS — D132 Benign neoplasm of duodenum: Secondary | ICD-10-CM | POA: Diagnosis not present

## 2016-02-26 DIAGNOSIS — K7460 Unspecified cirrhosis of liver: Secondary | ICD-10-CM | POA: Diagnosis not present

## 2016-02-26 DIAGNOSIS — K317 Polyp of stomach and duodenum: Secondary | ICD-10-CM | POA: Diagnosis not present

## 2016-02-26 MED ORDER — GLIMEPIRIDE 2 MG PO TABS
2.0000 mg | ORAL_TABLET | Freq: Two times a day (BID) | ORAL | 3 refills | Status: DC
Start: 2016-02-26 — End: 2016-11-12

## 2016-02-26 NOTE — Telephone Encounter (Signed)
Pt called in and needs her Glimepiride for 2x daily refilled and sent to the [facility] on [place].

## 2016-02-26 NOTE — Telephone Encounter (Signed)
Sent in RX

## 2016-03-02 DIAGNOSIS — Z86010 Personal history of colonic polyps: Secondary | ICD-10-CM | POA: Diagnosis not present

## 2016-03-02 DIAGNOSIS — K7460 Unspecified cirrhosis of liver: Secondary | ICD-10-CM | POA: Diagnosis not present

## 2016-03-09 DIAGNOSIS — R600 Localized edema: Secondary | ICD-10-CM | POA: Diagnosis not present

## 2016-04-02 DIAGNOSIS — R2232 Localized swelling, mass and lump, left upper limb: Secondary | ICD-10-CM | POA: Diagnosis not present

## 2016-04-02 DIAGNOSIS — M65331 Trigger finger, right middle finger: Secondary | ICD-10-CM | POA: Diagnosis not present

## 2016-04-02 DIAGNOSIS — E119 Type 2 diabetes mellitus without complications: Secondary | ICD-10-CM | POA: Diagnosis not present

## 2016-04-19 ENCOUNTER — Other Ambulatory Visit: Payer: Self-pay | Admitting: Cardiology

## 2016-04-19 MED ORDER — PANTOPRAZOLE SODIUM 40 MG PO TBEC: 40.0000 mg | DELAYED_RELEASE_TABLET | Freq: Every day | ORAL | 0 refills | Status: DC

## 2016-04-19 NOTE — Telephone Encounter (Signed)
°*  STAT* If patient is at the pharmacy, call can be transferred to refill team.   1. Which medications need to be refilled? (please list name of each medication and dose if known) Pantoprazol  2. Which pharmacy/location (including street and city if local pharmacy) is medication to be sent to?[phone]  3. Do they need a 30 day or 90 day supply? 180 and refills

## 2016-04-19 NOTE — Telephone Encounter (Signed)
Rx has been sent to the pharmacy electronically. ° °

## 2016-04-19 NOTE — Telephone Encounter (Deleted)
Rx(s) sent to pharmacy electronically.  

## 2016-04-26 ENCOUNTER — Emergency Department (HOSPITAL_COMMUNITY)
Admission: EM | Admit: 2016-04-26 | Discharge: 2016-04-26 | Disposition: A | Discharge status: Home / Self Care | Payer: Medicare Other | Attending: Emergency Medicine | Admitting: Emergency Medicine

## 2016-04-26 ENCOUNTER — Emergency Department (HOSPITAL_COMMUNITY): Payer: Medicare Other

## 2016-04-26 ENCOUNTER — Encounter (HOSPITAL_COMMUNITY): Payer: Self-pay

## 2016-04-26 DIAGNOSIS — Z87891 Personal history of nicotine dependence: Secondary | ICD-10-CM | POA: Insufficient documentation

## 2016-04-26 DIAGNOSIS — Z794 Long term (current) use of insulin: Secondary | ICD-10-CM | POA: Diagnosis not present

## 2016-04-26 DIAGNOSIS — Z951 Presence of aortocoronary bypass graft: Secondary | ICD-10-CM | POA: Diagnosis not present

## 2016-04-26 DIAGNOSIS — Z79899 Other long term (current) drug therapy: Secondary | ICD-10-CM | POA: Diagnosis not present

## 2016-04-26 DIAGNOSIS — E1140 Type 2 diabetes mellitus with diabetic neuropathy, unspecified: Secondary | ICD-10-CM | POA: Insufficient documentation

## 2016-04-26 DIAGNOSIS — R0602 Shortness of breath: Secondary | ICD-10-CM | POA: Insufficient documentation

## 2016-04-26 DIAGNOSIS — R188 Other ascites: Secondary | ICD-10-CM

## 2016-04-26 DIAGNOSIS — K7460 Unspecified cirrhosis of liver: Secondary | ICD-10-CM | POA: Diagnosis not present

## 2016-04-26 LAB — BODY FLUID CELL COUNT WITH DIFFERENTIAL
EOS FL: 0 %
LYMPHS FL: 68 %
MONOCYTE-MACROPHAGE-SEROUS FLUID: 12 % — AB (ref 50–90)
Neutrophil Count, Fluid: 20 % (ref 0–25)
Total Nucleated Cell Count, Fluid: 797 cu mm (ref 0–1000)

## 2016-04-26 LAB — COMPREHENSIVE METABOLIC PANEL
ALBUMIN: 3.4 g/dL — AB (ref 3.5–5.0)
ALK PHOS: 49 U/L (ref 38–126)
ALT: 20 U/L (ref 14–54)
AST: 23 U/L (ref 15–41)
Anion gap: 8 (ref 5–15)
BUN: 11 mg/dL (ref 6–20)
CALCIUM: 9.2 mg/dL (ref 8.9–10.3)
CO2: 23 mmol/L (ref 22–32)
CREATININE: 0.63 mg/dL (ref 0.44–1.00)
Chloride: 106 mmol/L (ref 101–111)
GFR calc Af Amer: >60 mL/min (ref >60)
GFR calc non Af Amer: >60 mL/min (ref >60)
GLUCOSE: 192 mg/dL — AB (ref 65–99)
Potassium: 4.1 mmol/L (ref 3.5–5.1)
Sodium: 137 mmol/L (ref 135–145)
Total Bilirubin: 1 mg/dL (ref 0.3–1.2)
Total Protein: 6.6 g/dL (ref 6.5–8.1)

## 2016-04-26 LAB — CBC
HCT: 43.2 % (ref 36.0–46.0)
HEMOGLOBIN: 14.5 g/dL (ref 12.0–15.0)
MCH: 33.5 pg (ref 26.0–34.0)
MCHC: 33.6 g/dL (ref 30.0–36.0)
MCV: 99.8 fL (ref 78.0–100.0)
Platelets: 139 10*3/uL — ABNORMAL LOW (ref 150–400)
RBC: 4.33 MIL/uL (ref 3.87–5.11)
RDW: 14.2 % (ref 11.5–15.5)
WBC: 7.1 10*3/uL (ref 4.0–10.5)

## 2016-04-26 LAB — PROTIME-INR
INR: 1.09
PROTHROMBIN TIME: 14.1 s (ref 11.4–15.2)

## 2016-04-26 LAB — URINALYSIS, ROUTINE W REFLEX MICROSCOPIC
BACTERIA UA: NONE SEEN
BILIRUBIN URINE: NEGATIVE
Glucose, UA: NEGATIVE mg/dL
Ketones, ur: NEGATIVE mg/dL
LEUKOCYTES UA: NEGATIVE
Nitrite: NEGATIVE
Protein, ur: NEGATIVE mg/dL
SPECIFIC GRAVITY, URINE: 1.006 (ref 1.005–1.030)
pH: 6 (ref 5.0–8.0)

## 2016-04-26 LAB — BRAIN NATRIURETIC PEPTIDE: B NATRIURETIC PEPTIDE 5: 96.5 pg/mL (ref 0.0–100.0)

## 2016-04-26 LAB — I-STAT TROPONIN, ED: Troponin i, poc: 0 ng/mL (ref 0.00–0.08)

## 2016-04-26 LAB — LIPASE, BLOOD: Lipase: 23 U/L (ref 11–51)

## 2016-04-26 NOTE — Discharge Instructions (Addendum)
Follow up with your gi doctor, continue your current medications

## 2016-04-26 NOTE — ED Notes (Signed)
ED Provider at bedside. 

## 2016-04-26 NOTE — ED Notes (Signed)
Patient transported to Ultrasound 

## 2016-04-26 NOTE — ED Provider Notes (Signed)
[facility] DEPT Provider Note   CSN: [id number] Arrival date & time: 04/26/16  1138     History   Chief Complaint Chief Complaint  Patient presents with  . Ascites    HPI Natalie Lane is a 71 y.o. female.  HPI P presents to the ED with complaints of progressive shortness of breath and increasing abdominal swelling.  Pt has a history of Ascites which has required paracentesis in the past.  Patient states over the last few days she's had increasing shortness of breath. This started mostly on Friday progressed throughout the weekend. She was seen at her doctor's office today, one of the [place] [facility]. They were concerned that she was developing effusions associated with her ascites. She was sent to the ED for further evaluation. She denies any fevers. No cough. No vomiting or diarrhea. Past Medical History:  Diagnosis Date  . Anemia   . Atrial flutter ([place])   . 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   . Cirrhosis ([place])   . Complication of anesthesia   . Diabetes mellitus, type 2 ([place])   . Dysrhythmia    AFlutter  . Edema   . GERD (gastroesophageal reflux disease)   . History of blood transfusion   . Hyperlipidemia   . Normal stress echocardiogram 2008  . PONV (postoperative nausea and vomiting)    with Cholecystectomy    Patient Active Problem List   Diagnosis Date Noted  . Esophageal varices ([place]) 01/23/2014  . Cirrhosis ([place]) 11/16/2013  . Chest pain at rest 09/23/2011  . Heart palpitations 12/03/2010  . Anemia   . Atrial flutter ([place])   . CHD (congenital heart disease)   . Edema   . Hyperlipidemia   . Poorly controlled type 2 diabetes mellitus with peripheral neuropathy [facility])     Past Surgical History:  Procedure Laterality Date  . ABDOMINAL HYSTERECTOMY    . ASD versus VSD repair  1978   Operative notes not available.  . ATRIAL ABLATION SURGERY  10 /2011  . [place]   x2  .  CHOLECYSTECTOMY    . CORONARY ARTERY BYPASS GRAFT  1978  . ESOPHAGOGASTRODUODENOSCOPY (EGD) WITH PROPOFOL N/A 11/16/2013   Procedure: ESOPHAGOGASTRODUODENOSCOPY (EGD) WITH PROPOFOL;  Surgeon: Lear Ng, MD;  Location: [facility];  Service: Endoscopy;  Laterality: N/A;  . ESOPHAGOGASTRODUODENOSCOPY (EGD) WITH PROPOFOL N/A 01/23/2014   Procedure: ESOPHAGOGASTRODUODENOSCOPY (EGD) WITH PROPOFOL;  Surgeon: Lear Ng, MD;  Location: [facility];  Service: Endoscopy;  Laterality: N/A;  . GASTRIC VARICES BANDING N/A 11/16/2013   Procedure: GASTRIC VARICES BANDING;  Surgeon: Lear Ng, MD;  Location: [place];  Service: Endoscopy;  Laterality: N/A;  . GASTRIC VARICES BANDING N/A 01/23/2014   Procedure: GASTRIC VARICES BANDING;  Surgeon: Lear Ng, MD;  Location: [facility];  Service: Endoscopy;  Laterality: N/A;  . HERNIA REPAIR    . OTHER SURGICAL HISTORY     partial hysterectomy    OB History    No data available       Home Medications    Prior to Admission medications   Medication Sig Start Date End Date Taking? Authorizing Provider  diphenhydrAMINE (BENADRYL) 25 MG tablet Take 25 mg by mouth every 6 (six) hours as needed for allergies.   Yes Historical Provider, MD  docusate sodium (COLACE) 100 MG capsule Take 100 mg by mouth 2 (two) times daily as needed for mild  constipation.   Yes Historical Provider, MD  glimepiride (AMARYL) 2 MG tablet Take 1 tablet (2 mg total) by mouth 2 (two) times daily before a meal. 02/26/16  Yes Philemon Kingdom, MD  ibuprofen (ADVIL,MOTRIN) 200 MG tablet Take 200 mg by mouth every 6 (six) hours as needed for headache, mild pain or moderate pain.    Yes Historical Provider, MD  insulin NPH Human (HUMULIN N,NOVOLIN N) 100 UNIT/ML injection ReliOn brand - Inject 25 units in am and 20 units at night under skin Patient taking differently: Inject 20-25 Units into the skin 2 (two) times daily. ReliOn brand - Pt uses 25 units  in the morning and 20 units at bedtime. 01/28/16  Yes Philemon Kingdom, MD  loperamide (IMODIUM A-D) 2 MG tablet Take 2 mg by mouth 4 (four) times daily as needed for diarrhea or loose stools.   Yes Historical Provider, MD  metFORMIN (GLUCOPHAGE-XR) 500 MG 24 hr tablet Take 1 tablet (500 mg total) by mouth 2 (two) times daily with a meal. 01/28/16  Yes Philemon Kingdom, MD  pantoprazole (PROTONIX) 40 MG tablet Take 1 tablet (40 mg total) by mouth daily. 04/19/16  Yes [name], MD  propranolol (INDERAL) 20 MG tablet Take 1 tablet (20 mg total) by mouth 2 (two) times daily. 08/06/15  Yes Burtis Junes, NP    Family History Family History  Problem Relation Age of Onset  . Hypertension Mother   . Kidney disease Sister   . Depression Paternal Grandfather   . Diabetes Brother     Social History Social History  Substance Use Topics  . Smoking status: Former Smoker    Packs/day: 0.50    Years: 36.00    Types: Cigarettes  . Smokeless tobacco: Never Used  . Alcohol use No     Allergies   Other; Red dye; Erythromycin; Penicillins; Statins; Sulfa antibiotics; Glipizide; and Iohexol   Review of Systems Review of Systems  All other systems reviewed and are negative.    Physical Exam Updated Vital Signs BP 123/77 (BP Location: Right Arm)   Pulse 82   Temp 97.8 F (36.6 C) (Oral)   Resp 19   Ht 5\' 4"  (1.626 m)   Wt 92.1 kg   SpO2 98%   BMI 34.84 kg/m   Physical Exam  Constitutional: No distress.  HENT:  Head: Normocephalic and atraumatic.  Right Ear: External ear normal.  Left Ear: External ear normal.  Eyes: Conjunctivae are normal. Right eye exhibits no discharge. Left eye exhibits no discharge. No scleral icterus.  Neck: Neck supple. No tracheal deviation present.  Cardiovascular: Normal rate, regular rhythm and intact distal pulses.   Pulmonary/Chest: Effort normal. No stridor. No respiratory distress. She has no wheezes. She has rales.  Bilateral crackles    Abdominal: Soft. Bowel sounds are normal. She exhibits ascites. She exhibits no distension. There is no tenderness. There is no rebound and no guarding.  Musculoskeletal: She exhibits no edema or tenderness.  Neurological: She is alert. She has normal strength. No cranial nerve deficit (no facial droop, extraocular movements intact, no slurred speech) or sensory deficit. She exhibits normal muscle tone. She displays no seizure activity. Coordination normal.  Skin: Skin is warm and dry. No rash noted.  Psychiatric: She has a normal mood and affect.  Nursing note and vitals reviewed.    ED Treatments / Results  Labs (all labs ordered are listed, but only abnormal results are displayed) Labs Reviewed  CBC - Abnormal; Notable  for the following:       Result Value   Platelets 139 (*)    All other components within normal limits  COMPREHENSIVE METABOLIC PANEL - Abnormal; Notable for the following:    Glucose, Bld 192 (*)    Albumin 3.4 (*)    All other components within normal limits  URINALYSIS, ROUTINE W REFLEX MICROSCOPIC - Abnormal; Notable for the following:    Hgb urine dipstick SMALL (*)    Squamous Epithelial / LPF 0-5 (*)    All other components within normal limits  BODY FLUID CULTURE  LIPASE, BLOOD  BRAIN NATRIURETIC PEPTIDE  PROTIME-INR  BODY FLUID CELL COUNT WITH DIFFERENTIAL  I-STAT TROPOININ, ED    EKG  EKG Interpretation  Date/Time:  Monday April 26 2016 12:26:39 EDT Ventricular Rate:  81 PR Interval:    QRS Duration: 81 QT Interval:  397 QTC Calculation: 461 R Axis:   43 Text Interpretation:  Sinus rhythm Low voltage, extremity and precordial leads Abnormal R-wave progression, early transition Nonspecific T abnormalities, anterior leads No significant change since last tracing Confirmed by Nurah Petrides  MD-J, Besan Ketchem ([id number]) on 04/26/2016 1:04:59 PM       Radiology Dg Chest 2 View  Result Date: 04/26/2016 CLINICAL DATA:  Shortness of breath.  Ascites.  Cirrhosis.  EXAM: CHEST  2 VIEW COMPARISON:  Chest x-rays dated 11/16/2013 and 08/31/2011 FINDINGS: Heart size and pulmonary vascularity are normal. The patient has moderate linear atelectasis at both lung bases. No consolidative infiltrates or effusions. Bilateral peribronchial thickening. No acute bone abnormality. IMPRESSION: Bibasilar atelectasis and peribronchial thickening. The findings are consistent with bronchitis. Electronically Signed   By: Lorriane Shire M.D.   On: 04/26/2016 12:57    Procedures Procedures (including critical care time)  Medications Ordered in ED Medications - No data to display   Initial Impression / Assessment and Plan / ED Course  I have reviewed the triage vital signs and the nursing notes.  Pertinent labs & imaging results that were available during my care of the patient were reviewed by me and considered in my medical decision making (see chart for details).  Clinical Course as of Apr 27 1602  Mon Apr 26, 2016  1534 Labs do not show signs of CHF or pneumonia.  Her dyspnea may be related to her ascites.  Will arrange for paracentesis  [JK]  1602 Pt is feeling better after her paracentesis.   She feels well enough to go home.  [JK]    Clinical Course User Index [JK] Dorie Rank, MD      Final Clinical Impressions(s) / ED Diagnoses   Final diagnoses:  Ascites  Other ascites    New Prescriptions New Prescriptions   No medications on file     Dorie Rank, MD 04/26/16 [phone]

## 2016-04-26 NOTE — ED Triage Notes (Signed)
PT C/O ABDOMINAL DISTENTION, LUQ PAIN, AND SOB SINCE Friday. PT SEEN AT [facility] TODAY ANS SENT FOR FURTHER EVALUATION. PT HAS A HX OF ASCITES,A ND THEY ARE CONCERNED THE FLUID HAS GONE INTO HER LUNGS.

## 2016-04-26 NOTE — Procedures (Signed)
PROCEDURE SUMMARY:  Successful US guided paracentesis from left lateral abdomen.  Yielded 4.3 of chylous fluid.  No immediate complications.  Pt tolerated well.   Specimen was sent for labs.  Judie Grieve Vergie Zahm PA-C 04/26/2016 3:46 PM

## 2016-04-27 ENCOUNTER — Ambulatory Visit: Payer: Self-pay | Admitting: Internal Medicine

## 2016-04-27 LAB — GRAM STAIN

## 2016-04-29 LAB — PATHOLOGIST SMEAR REVIEW

## 2016-05-01 LAB — CULTURE, BODY FLUID W GRAM STAIN -BOTTLE: Culture: NO GROWTH

## 2016-05-03 DIAGNOSIS — K7460 Unspecified cirrhosis of liver: Secondary | ICD-10-CM | POA: Diagnosis not present

## 2016-05-03 DIAGNOSIS — R188 Other ascites: Secondary | ICD-10-CM | POA: Diagnosis not present

## 2016-05-10 ENCOUNTER — Inpatient Hospital Stay (HOSPITAL_COMMUNITY)
Admission: EM | Admit: 2016-05-10 | Discharge: 2016-05-12 | DRG: 432 | Disposition: A | Discharge status: Home / Self Care | Payer: Medicare Other | Attending: Internal Medicine | Admitting: Internal Medicine

## 2016-05-10 ENCOUNTER — Emergency Department (HOSPITAL_COMMUNITY): Payer: Medicare Other

## 2016-05-10 ENCOUNTER — Encounter (HOSPITAL_COMMUNITY): Payer: Self-pay | Admitting: *Deleted

## 2016-05-10 DIAGNOSIS — R0602 Shortness of breath: Secondary | ICD-10-CM

## 2016-05-10 DIAGNOSIS — Z794 Long term (current) use of insulin: Secondary | ICD-10-CM

## 2016-05-10 DIAGNOSIS — E8809 Other disorders of plasma-protein metabolism, not elsewhere classified: Secondary | ICD-10-CM | POA: Diagnosis present

## 2016-05-10 DIAGNOSIS — E669 Obesity, unspecified: Secondary | ICD-10-CM | POA: Diagnosis present

## 2016-05-10 DIAGNOSIS — Z888 Allergy status to other drugs, medicaments and biological substances status: Secondary | ICD-10-CM

## 2016-05-10 DIAGNOSIS — K7469 Other cirrhosis of liver: Principal | ICD-10-CM | POA: Diagnosis present

## 2016-05-10 DIAGNOSIS — R9431 Abnormal electrocardiogram [ECG] [EKG]: Secondary | ICD-10-CM | POA: Diagnosis not present

## 2016-05-10 DIAGNOSIS — Q210 Ventricular septal defect: Secondary | ICD-10-CM

## 2016-05-10 DIAGNOSIS — Z6835 Body mass index (BMI) 35.0-35.9, adult: Secondary | ICD-10-CM

## 2016-05-10 DIAGNOSIS — Z9102 Food additives allergy status: Secondary | ICD-10-CM

## 2016-05-10 DIAGNOSIS — Z880 Allergy status to penicillin: Secondary | ICD-10-CM

## 2016-05-10 DIAGNOSIS — Z90710 Acquired absence of both cervix and uterus: Secondary | ICD-10-CM

## 2016-05-10 DIAGNOSIS — J9621 Acute and chronic respiratory failure with hypoxia: Secondary | ICD-10-CM | POA: Diagnosis not present

## 2016-05-10 DIAGNOSIS — R188 Other ascites: Secondary | ICD-10-CM | POA: Diagnosis not present

## 2016-05-10 DIAGNOSIS — Z841 Family history of disorders of kidney and ureter: Secondary | ICD-10-CM

## 2016-05-10 DIAGNOSIS — I4892 Unspecified atrial flutter: Secondary | ICD-10-CM | POA: Diagnosis present

## 2016-05-10 DIAGNOSIS — K7290 Hepatic failure, unspecified without coma: Secondary | ICD-10-CM | POA: Diagnosis not present

## 2016-05-10 DIAGNOSIS — Z951 Presence of aortocoronary bypass graft: Secondary | ICD-10-CM

## 2016-05-10 DIAGNOSIS — J9601 Acute respiratory failure with hypoxia: Secondary | ICD-10-CM | POA: Diagnosis not present

## 2016-05-10 DIAGNOSIS — Z91041 Radiographic dye allergy status: Secondary | ICD-10-CM

## 2016-05-10 DIAGNOSIS — I471 Supraventricular tachycardia: Secondary | ICD-10-CM | POA: Diagnosis not present

## 2016-05-10 DIAGNOSIS — I350 Nonrheumatic aortic (valve) stenosis: Secondary | ICD-10-CM | POA: Diagnosis present

## 2016-05-10 DIAGNOSIS — R079 Chest pain, unspecified: Secondary | ICD-10-CM | POA: Diagnosis not present

## 2016-05-10 DIAGNOSIS — E1142 Type 2 diabetes mellitus with diabetic polyneuropathy: Secondary | ICD-10-CM | POA: Diagnosis not present

## 2016-05-10 DIAGNOSIS — Z881 Allergy status to other antibiotic agents status: Secondary | ICD-10-CM

## 2016-05-10 DIAGNOSIS — K7460 Unspecified cirrhosis of liver: Secondary | ICD-10-CM | POA: Diagnosis present

## 2016-05-10 DIAGNOSIS — D649 Anemia, unspecified: Secondary | ICD-10-CM | POA: Diagnosis present

## 2016-05-10 DIAGNOSIS — E1165 Type 2 diabetes mellitus with hyperglycemia: Secondary | ICD-10-CM

## 2016-05-10 DIAGNOSIS — R0600 Dyspnea, unspecified: Secondary | ICD-10-CM

## 2016-05-10 DIAGNOSIS — Z833 Family history of diabetes mellitus: Secondary | ICD-10-CM

## 2016-05-10 DIAGNOSIS — Z882 Allergy status to sulfonamides status: Secondary | ICD-10-CM

## 2016-05-10 DIAGNOSIS — E785 Hyperlipidemia, unspecified: Secondary | ICD-10-CM | POA: Diagnosis not present

## 2016-05-10 DIAGNOSIS — R0789 Other chest pain: Secondary | ICD-10-CM | POA: Diagnosis present

## 2016-05-10 DIAGNOSIS — R062 Wheezing: Secondary | ICD-10-CM | POA: Diagnosis not present

## 2016-05-10 DIAGNOSIS — Z87891 Personal history of nicotine dependence: Secondary | ICD-10-CM

## 2016-05-10 DIAGNOSIS — R069 Unspecified abnormalities of breathing: Secondary | ICD-10-CM | POA: Diagnosis not present

## 2016-05-10 DIAGNOSIS — K219 Gastro-esophageal reflux disease without esophagitis: Secondary | ICD-10-CM | POA: Diagnosis present

## 2016-05-10 DIAGNOSIS — I8500 Esophageal varices without bleeding: Secondary | ICD-10-CM | POA: Diagnosis present

## 2016-05-10 DIAGNOSIS — Z79899 Other long term (current) drug therapy: Secondary | ICD-10-CM

## 2016-05-10 DIAGNOSIS — Z8249 Family history of ischemic heart disease and other diseases of the circulatory system: Secondary | ICD-10-CM

## 2016-05-10 HISTORY — DX: Dyspnea, unspecified: R06.00

## 2016-05-10 LAB — CBC WITH DIFFERENTIAL/PLATELET
BASOS ABS: 0 10*3/uL (ref 0.0–0.1)
BASOS PCT: 0 %
Eosinophils Absolute: 0.2 10*3/uL (ref 0.0–0.7)
Eosinophils Relative: 2 %
HEMATOCRIT: 44.8 % (ref 36.0–46.0)
HEMOGLOBIN: 15.3 g/dL — AB (ref 12.0–15.0)
Lymphocytes Relative: 16 %
Lymphs Abs: 1.3 10*3/uL (ref 0.7–4.0)
MCH: 33.9 pg (ref 26.0–34.0)
MCHC: 34.2 g/dL (ref 30.0–36.0)
MCV: 99.3 fL (ref 78.0–100.0)
Monocytes Absolute: 1.4 10*3/uL — ABNORMAL HIGH (ref 0.1–1.0)
Monocytes Relative: 17 %
NEUTROS PCT: 65 %
Neutro Abs: 5.2 10*3/uL (ref 1.7–7.7)
Platelets: 204 10*3/uL (ref 150–400)
RBC: 4.51 MIL/uL (ref 3.87–5.11)
RDW: 14.1 % (ref 11.5–15.5)
WBC: 8.1 10*3/uL (ref 4.0–10.5)

## 2016-05-10 LAB — COMPREHENSIVE METABOLIC PANEL
ALBUMIN: 3.1 g/dL — AB (ref 3.5–5.0)
ALT: 32 U/L (ref 14–54)
AST: 51 U/L — AB (ref 15–41)
Alkaline Phosphatase: 115 U/L (ref 38–126)
Anion gap: 11 (ref 5–15)
BUN: 12 mg/dL (ref 6–20)
CHLORIDE: 104 mmol/L (ref 101–111)
CO2: 25 mmol/L (ref 22–32)
Calcium: 9 mg/dL (ref 8.9–10.3)
Creatinine, Ser: 0.9 mg/dL (ref 0.44–1.00)
GFR calc Af Amer: >60 mL/min (ref >60)
GFR calc non Af Amer: >60 mL/min (ref >60)
GLUCOSE: 126 mg/dL — AB (ref 65–99)
POTASSIUM: 4.8 mmol/L (ref 3.5–5.1)
SODIUM: 140 mmol/L (ref 135–145)
Total Bilirubin: 2 mg/dL — ABNORMAL HIGH (ref 0.3–1.2)
Total Protein: 6 g/dL — ABNORMAL LOW (ref 6.5–8.1)

## 2016-05-10 LAB — GLUCOSE, CAPILLARY: GLUCOSE-CAPILLARY: 115 mg/dL — AB (ref 65–99)

## 2016-05-10 LAB — APTT: aPTT: 44 seconds — ABNORMAL HIGH (ref 24–36)

## 2016-05-10 LAB — D-DIMER, QUANTITATIVE: D-Dimer, Quant: >20 ug/mL-FEU — ABNORMAL HIGH (ref 0.00–0.50)

## 2016-05-10 LAB — TROPONIN I: Troponin I: <0.03 ng/mL (ref <0.03)

## 2016-05-10 LAB — BRAIN NATRIURETIC PEPTIDE: B Natriuretic Peptide: 114.7 pg/mL — ABNORMAL HIGH (ref 0.0–100.0)

## 2016-05-10 LAB — PROTIME-INR
INR: 1.32
PROTHROMBIN TIME: 16.4 s — AB (ref 11.4–15.2)

## 2016-05-10 MED ORDER — SODIUM CHLORIDE 0.9% FLUSH: 3.0000 mL | INTRAVENOUS | Status: DC | PRN

## 2016-05-10 MED ORDER — INSULIN ASPART 100 UNIT/ML ~~LOC~~ SOLN
0.0000 [IU] | Freq: Three times a day (TID) | SUBCUTANEOUS | Status: DC
  Administered 2016-05-11 (×2): 1 [IU] via SUBCUTANEOUS
  Administered 2016-05-12: 3 [IU] via SUBCUTANEOUS
  Administered 2016-05-12: 2 [IU] via SUBCUTANEOUS

## 2016-05-10 MED ORDER — SODIUM CHLORIDE 0.9% FLUSH
3.0000 mL | Freq: Two times a day (BID) | INTRAVENOUS | Status: DC
  Administered 2016-05-10 – 2016-05-11 (×2): 3 mL via INTRAVENOUS

## 2016-05-10 MED ORDER — SODIUM CHLORIDE 0.9 % IV SOLN
250.0000 mL | INTRAVENOUS | Status: DC | PRN
Start: 2016-05-10 — End: 2016-05-12

## 2016-05-10 MED ORDER — ONDANSETRON HCL 4 MG/2ML IJ SOLN: 4.0000 mg | Freq: Four times a day (QID) | INTRAMUSCULAR | Status: DC | PRN

## 2016-05-10 MED ORDER — ACETAMINOPHEN 650 MG RE SUPP: 650.0000 mg | Freq: Four times a day (QID) | RECTAL | Status: DC | PRN

## 2016-05-10 MED ORDER — FUROSEMIDE 20 MG PO TABS
20.0000 mg | ORAL_TABLET | Freq: Every day | ORAL | Status: DC
  Administered 2016-05-11 – 2016-05-12 (×2): 20 mg via ORAL
  Filled 2016-05-10 (×2): qty 1

## 2016-05-10 MED ORDER — FUROSEMIDE 10 MG/ML IJ SOLN: 40.0000 mg | Freq: Once | INTRAMUSCULAR | Status: DC

## 2016-05-10 MED ORDER — SODIUM CHLORIDE 0.9% FLUSH
3.0000 mL | Freq: Two times a day (BID) | INTRAVENOUS | Status: DC
  Administered 2016-05-10 – 2016-05-12 (×3): 3 mL via INTRAVENOUS

## 2016-05-10 MED ORDER — ALBUTEROL SULFATE (2.5 MG/3ML) 0.083% IN NEBU: 2.5000 mg | INHALATION_SOLUTION | RESPIRATORY_TRACT | Status: DC | PRN

## 2016-05-10 MED ORDER — RIFAXIMIN 550 MG PO TABS
550.0000 mg | ORAL_TABLET | Freq: Two times a day (BID) | ORAL | Status: DC
  Administered 2016-05-11 – 2016-05-12 (×3): 550 mg via ORAL
  Filled 2016-05-10 (×4): qty 1

## 2016-05-10 MED ORDER — PROPRANOLOL HCL 40 MG PO TABS
20.0000 mg | ORAL_TABLET | Freq: Two times a day (BID) | ORAL | Status: DC
  Administered 2016-05-10 – 2016-05-12 (×4): 20 mg via ORAL
  Filled 2016-05-10 (×4): qty 1

## 2016-05-10 MED ORDER — PANTOPRAZOLE SODIUM 40 MG PO TBEC
40.0000 mg | DELAYED_RELEASE_TABLET | Freq: Every day | ORAL | Status: DC
  Administered 2016-05-10 – 2016-05-12 (×3): 40 mg via ORAL
  Filled 2016-05-10 (×3): qty 1

## 2016-05-10 MED ORDER — ONDANSETRON HCL 4 MG PO TABS: 4.0000 mg | ORAL_TABLET | Freq: Four times a day (QID) | ORAL | Status: DC | PRN

## 2016-05-10 MED ORDER — ACETAMINOPHEN 325 MG PO TABS: 650.0000 mg | ORAL_TABLET | Freq: Four times a day (QID) | ORAL | Status: DC | PRN

## 2016-05-10 MED ORDER — SIMETHICONE 80 MG PO CHEW
80.0000 mg | CHEWABLE_TABLET | Freq: Four times a day (QID) | ORAL | Status: DC | PRN
  Filled 2016-05-10: qty 1

## 2016-05-10 NOTE — Consult Note (Signed)
CARDIOLOGY CONSULT NOTE     Patient ID: EMMALIN JAQUESS MRN: [id number] DOB/AGE: [birth year] 70 y.o.  Admit date: 05/10/2016 Referring Physician Adan Sis MD Primary Physician Reginia Naas, MD Primary Cardiologist Aniello Christopoulos Martinique MD Reason for Consultation SOB  HPI: 71 yo WF seen at the request of Dr. Lindaann Pascal for evaluation of dyspnea. She has a remote history of atrial flutter. 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 She had a normal stress Echo in the past. Echo in 2013 showed no shunt and normal LV/RV function.  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 She is s/p banding. Due to her varices she was switched from metoprolol to propranolol. Initially she became very weak on this and tired. This  improved but she still liked the metoprolol better. 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 She is followed here by Dr. Michail Sermon. She did have an Echo here in July 2017 that showed normal LV function, grade 2 diastolic dysfunction, and mild Aortic stenosis. She reports recent increase in ascites with increase in abdominal girth and increase in dyspnea. She was seen in the ED on 04/26/16 and underwent paracentesis with removal of 4 liters of fluid. This gave her some mild relief but now symptoms have returned. She denies any bleeding, syncope or dizziness. Notes marked dyspnea with any activity. No cough. Had some pain under left rib cage. Has been taking lasix and aldactone without improvement.    Past Medical History:  Diagnosis Date  . Anemia   . Atrial flutter ([place])   . 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   . Cirrhosis ([place])   . Complication of anesthesia   . Diabetes mellitus, type 2 ([place])   . Dysrhythmia    AFlutter  . Edema   . GERD (gastroesophageal  reflux disease)   . History of blood transfusion   . Hyperlipidemia   . Normal stress echocardiogram 2008  . PONV (postoperative nausea and vomiting)    with Cholecystectomy    Family History  Problem Relation Age of Onset  . Hypertension Mother   . Kidney disease Sister   . Depression Paternal Grandfather   . Diabetes Brother     Social History   Social History  . Marital status: Married    Spouse name: N/A  . Number of children: 3  . Years of education: N/A   Occupational History  . Office management [employer]   Social History Main Topics  . Smoking status: Former Smoker    Packs/day: 0.50    Years: 36.00    Types: Cigarettes  . Smokeless tobacco: Never Used  . Alcohol use No  . Drug use: No  . Sexual activity: Not on file   Other Topics Concern  . Not on file   Social History Narrative  . No narrative on file    Past Surgical History:  Procedure Laterality Date  . ABDOMINAL HYSTERECTOMY    . ASD versus VSD repair  1978   Operative notes not available.  . ATRIAL ABLATION SURGERY  10 /2011  . [place]   x2  . CHOLECYSTECTOMY    . CORONARY ARTERY BYPASS GRAFT  1978  . ESOPHAGOGASTRODUODENOSCOPY (EGD) WITH PROPOFOL N/A 11/16/2013   Procedure: ESOPHAGOGASTRODUODENOSCOPY (EGD)  WITH PROPOFOL;  Surgeon: Lear Ng, MD;  Location: [facility] ENDOSCOPY;  Service: Endoscopy;  Laterality: N/A;  . ESOPHAGOGASTRODUODENOSCOPY (EGD) WITH PROPOFOL N/A 01/23/2014   Procedure: ESOPHAGOGASTRODUODENOSCOPY (EGD) WITH PROPOFOL;  Surgeon: Lear Ng, MD;  Location: [facility];  Service: Endoscopy;  Laterality: N/A;  . GASTRIC VARICES BANDING N/A 11/16/2013   Procedure: GASTRIC VARICES BANDING;  Surgeon: Lear Ng, MD;  Location: [place];  Service: Endoscopy;  Laterality: N/A;  . GASTRIC VARICES BANDING N/A 01/23/2014   Procedure: GASTRIC VARICES BANDING;  Surgeon: Lear Ng, MD;  Location: [facility];  Service: Endoscopy;   Laterality: N/A;  . HERNIA REPAIR    . OTHER SURGICAL HISTORY     partial hysterectomy     No current facility-administered medications on file prior to encounter.    Current Outpatient Prescriptions on File Prior to Encounter  Medication Sig Dispense Refill  . diphenhydrAMINE (BENADRYL) 25 MG tablet Take 25 mg by mouth every 6 (six) hours as needed for allergies.    Marland Kitchen docusate sodium (COLACE) 100 MG capsule Take 100 mg by mouth 2 (two) times daily as needed for mild constipation.    Marland Kitchen glimepiride (AMARYL) 2 MG tablet Take 1 tablet (2 mg total) by mouth 2 (two) times daily before a meal. (Patient taking differently: Take 2 mg by mouth 2 (two) times daily. ) 180 tablet 3  . ibuprofen (ADVIL,MOTRIN) 200 MG tablet Take 200 mg by mouth every 6 (six) hours as needed for headache, mild pain or moderate pain.     Marland Kitchen insulin NPH Human (HUMULIN N,NOVOLIN N) 100 UNIT/ML injection ReliOn brand - Inject 25 units in am and 20 units at night under skin (Patient taking differently: Inject 20-25 Units into the skin 2 (two) times daily. ReliOn brand - Pt uses 25 units in the morning and 20 units at bedtime.) 20 mL 11  . loperamide (IMODIUM A-D) 2 MG tablet Take 2 mg by mouth 4 (four) times daily as needed for diarrhea or loose stools.    . metFORMIN (GLUCOPHAGE-XR) 500 MG 24 hr tablet Take 1 tablet (500 mg total) by mouth 2 (two) times daily with a meal. 180 tablet 3  . propranolol (INDERAL) 20 MG tablet Take 1 tablet (20 mg total) by mouth 2 (two) times daily. 60 tablet 11   Also on aldactone and lasix- waiting to confirm dose.   Allergies  Allergen Reactions  . Monosodium Glutamate Anaphylaxis    MSG  . Red Dye Anaphylaxis  . Erythromycin Other (See Comments)    Reaction:  Migraine   . Penicillins Other (See Comments)    Reaction:  Migraine  Has patient had a PCN reaction causing immediate rash, facial/tongue/throat swelling, SOB or lightheadedness with hypotension: No Has patient had a PCN reaction  causing severe rash involving mucus membranes or skin necrosis: No Has patient had a PCN reaction that required hospitalization No Has patient had a PCN reaction occurring within the last 10 years: No If all of the above answers are "NO", then may proceed with Cephalosporin use.  . Statins Other (See Comments)    Reaction:  Severe leg cramps   . Sulfa Antibiotics Hives  . Glipizide Rash  . Iohexol Rash  . Spironolactone Palpitations and Other (See Comments)    Sweating      ROS: As noted in HPI. All other systems are reviewed and are negative unless otherwise mentioned.   Physical Exam: Blood pressure 119/85, pulse 94, temperature 98.4 F (36.9  C), temperature source Oral, resp. rate 19, weight 204 lb (92.5 kg), SpO2 100 %. Current Weight  05/10/16 204 lb (92.5 kg)  04/26/16 203 lb (92.1 kg)  01/28/16 189 lb (85.7 kg)    GENERAL:  Well appearing, overweight WF in mild respiratory distress.  HEENT:  PERRL, EOMI, sclera are clear. Oropharynx is clear. NECK:  No jugular venous distention, carotid upstroke brisk and symmetric, no bruits, no thyromegaly or adenopathy LUNGS:  basilar crackles. CHEST:  Unremarkable HEART:  RRR,  PMI not displaced or sustained,S1 and S2 within normal limits, no S3, no S4: no clicks, no rubs, no murmurs ABD:  Soft, nontender. BS +, significant ascites. EXT:  2 + pulses throughout, 1+ edema, no cyanosis no clubbing SKIN:  Warm and dry.  No rashes NEURO:  Alert and oriented x 3. Cranial nerves II through XII intact. PSYCH:  Cognitively intact    Labs:   Lab Results  Component Value Date   WBC 8.1 05/10/2016   HGB 15.3 (H) 05/10/2016   HCT 44.8 05/10/2016   MCV 99.3 05/10/2016   PLT 204 05/10/2016    Recent Labs Lab 05/10/16 1431  NA 140  K 4.8  CL 104  CO2 25  BUN 12  CREATININE 0.90  CALCIUM 9.0  PROT 6.0*  BILITOT 2.0*  ALKPHOS 115  ALT 32  AST 51*  GLUCOSE 126*   Lab Results  Component Value Date   CKTOTAL 45 09/01/2011    CKTOTAL 47 09/01/2011   CKTOTAL 45 09/01/2011   CKMB 1.3 09/01/2011   CKMB 1.4 09/01/2011   CKMB 1.4 09/01/2011   TROPONINI <0.03 05/10/2016   TROPONINI <0.30 09/01/2011   TROPONINI <0.30 09/01/2011   Lab Results  Component Value Date   CHOL 287 (A) 02/28/2014   Lab Results  Component Value Date   HDL 62 02/28/2014   Lab Results  Component Value Date   LDLCALC 196 02/28/2014   Lab Results  Component Value Date   TRIG 148 02/28/2014   No results found for: CHOLHDL No results found for: LDLDIRECT  No results found for: PROBNP Lab Results  Component Value Date   TSH 1.637 12/10/2009   Lab Results  Component Value Date   HGBA1C 7.2 01/28/2016    Radiology: Dg Chest 2 View  Result Date: 05/10/2016 CLINICAL DATA:  Difficulty breathing for 3 days. EXAM: CHEST  2 VIEW COMPARISON:  04/26/2016 FINDINGS: Low volume film. Interval progression of subsegmental atelectasis of both lung bases. No pulmonary edema or pleural effusion. The cardiopericardial silhouette is within normal limits for size. Patient is status post median sternotomy. The visualized bony structures of the thorax are intact. IMPRESSION: Low volume film with slight progression of bibasilar atelectasis. Electronically Signed   By: Misty Stanley M.D.   On: 05/10/2016 14:17   Telemetry: NSR with sinus tachycardia up to 140 bpm.   EKG: sinus tachy, early transition. T wave changes consider anterior ischemia.  ASSESSMENT AND PLAN:  1. Acute on chronic respiratory failure with hypoxia. I suspect this is related to severe ascites with inability to adequately move her diaphragm. Doubt CHF given CXR and BNP findings. Consider PE. Will check D- dimer. Echo ordered. (bedside Echo showed no pericardial effusion. May benefit from repeat paracentesis. 2. Sinus tachycardia secondary to #1. 3. 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 Plan per primary team and GI. 4. History of remote VSD  repair.  5. Remote atrial flutter. In sinus rhythm now.  Signed: Cruzito Standre Martinique, [place]  05/10/2016, 6:39 PM

## 2016-05-10 NOTE — ED Notes (Signed)
Cards at bedside

## 2016-05-10 NOTE — ED Triage Notes (Signed)
Per EMS- pt reports SOB and increased "tiredness" pt was found to be wheezing with EMS. Pt reports SOB and Chest pain last night. Pt has hx of ascites and was at [facility] last week for that. Pt states that she was also recently placed on a new diuretic. Pt speaking in full sentences and ambulatory to restroom upon arrival. Pt received 2.5mg  of albuterol en route with decrease in wheezing per EMS

## 2016-05-10 NOTE — ED Provider Notes (Addendum)
[place] DEPT Provider Note   CSN: [id number] Arrival date & time: 05/10/16  1317     History   Chief Complaint Chief Complaint  Patient presents with  . Shortness of Breath    HPI Natalie Lane is a 71 y.o. female.  HPI  Pt comes in with cc of dib. Pt has hx of DM, CHF, Non-alcoholic cirrhosis with ascites. Pt reports that Natalie Lane has dib with exertion. Natalie Lane now walks a couple of steps and gets short of breath. Pt has no chest pain.  Pt reports that Natalie Lane had 4.2 liters removed few days ago. After the fluid was removed, Natalie Lane continued to have exertional dib - but with Natalie Lane increased girth, the dib has gotten worse.   Past Medical History:  Diagnosis Date  . Anemia   . Atrial flutter ([place])   . 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   . Cirrhosis ([place])   . Complication of anesthesia   . Diabetes mellitus, type 2 ([place])   . Dysrhythmia    AFlutter  . Edema   . GERD (gastroesophageal reflux disease)   . History of blood transfusion   . Hyperlipidemia   . Normal stress echocardiogram 2008  . PONV (postoperative nausea and vomiting)    with Cholecystectomy    Patient Active Problem List   Diagnosis Date Noted  . Ascites 05/10/2016  . Decompensated hepatic cirrhosis ([place]) 05/10/2016  . Esophageal varices ([place]) 01/23/2014  . Cirrhosis ([place]) 11/16/2013  . Chest pain at rest 09/23/2011  . Heart palpitations 12/03/2010  . Atrial flutter ([place])   . CHD (congenital heart disease)   . Edema   . Hyperlipidemia   . Poorly controlled type 2 diabetes mellitus with peripheral neuropathy [facility])     Past Surgical History:  Procedure Laterality Date  . ABDOMINAL HYSTERECTOMY    . ASD versus VSD repair  1978   Operative notes not available.  . ATRIAL ABLATION SURGERY  10 /2011  . [place]   x2  . CHOLECYSTECTOMY    . CORONARY ARTERY BYPASS GRAFT  1978  . ESOPHAGOGASTRODUODENOSCOPY (EGD) WITH PROPOFOL  N/A 11/16/2013   Procedure: ESOPHAGOGASTRODUODENOSCOPY (EGD) WITH PROPOFOL;  Surgeon: Lear Ng, MD;  Location: [place];  Service: Endoscopy;  Laterality: N/A;  . ESOPHAGOGASTRODUODENOSCOPY (EGD) WITH PROPOFOL N/A 01/23/2014   Procedure: ESOPHAGOGASTRODUODENOSCOPY (EGD) WITH PROPOFOL;  Surgeon: Lear Ng, MD;  Location: [place];  Service: Endoscopy;  Laterality: N/A;  . GASTRIC VARICES BANDING N/A 11/16/2013   Procedure: GASTRIC VARICES BANDING;  Surgeon: Lear Ng, MD;  Location: [place];  Service: Endoscopy;  Laterality: N/A;  . GASTRIC VARICES BANDING N/A 01/23/2014   Procedure: GASTRIC VARICES BANDING;  Surgeon: Lear Ng, MD;  Location: [place];  Service: Endoscopy;  Laterality: N/A;  . HERNIA REPAIR    . OTHER SURGICAL HISTORY     partial hysterectomy    OB History    No data available       Home Medications    Prior to Admission medications   Medication Sig Start Date End Date Taking? Authorizing Provider  diphenhydrAMINE (BENADRYL) 25 MG tablet Take 25 mg by mouth every 6 (six) hours as needed for allergies.    Historical Provider, MD  docusate sodium (COLACE) 100 MG capsule Take 100 mg by mouth 2 (two) times daily as needed for mild constipation.    Historical Provider, MD  glimepiride (  AMARYL) 2 MG tablet Take 1 tablet (2 mg total) by mouth 2 (two) times daily before a meal. 02/26/16   Philemon Kingdom, MD  ibuprofen (ADVIL,MOTRIN) 200 MG tablet Take 200 mg by mouth every 6 (six) hours as needed for headache, mild pain or moderate pain.     Historical Provider, MD  insulin NPH Human (HUMULIN N,NOVOLIN N) 100 UNIT/ML injection ReliOn brand - Inject 25 units in am and 20 units at night under skin Patient taking differently: Inject 20-25 Units into the skin 2 (two) times daily. ReliOn brand - Pt uses 25 units in the morning and 20 units at bedtime. 01/28/16   Philemon Kingdom, MD  loperamide (IMODIUM A-D) 2 MG tablet Take 2 mg  by mouth 4 (four) times daily as needed for diarrhea or loose stools.    Historical Provider, MD  metFORMIN (GLUCOPHAGE-XR) 500 MG 24 hr tablet Take 1 tablet (500 mg total) by mouth 2 (two) times daily with a meal. 01/28/16   Philemon Kingdom, MD  pantoprazole (PROTONIX) 40 MG tablet Take 1 tablet (40 mg total) by mouth daily. 04/19/16   [name], MD  propranolol (INDERAL) 20 MG tablet Take 1 tablet (20 mg total) by mouth 2 (two) times daily. 08/06/15   Burtis Junes, NP    Family History Family History  Problem Relation Age of Onset  . Hypertension Mother   . Kidney disease Sister   . Depression Paternal Grandfather   . Diabetes Brother     Social History Social History  Substance Use Topics  . Smoking status: Former Smoker    Packs/day: 0.50    Years: 36.00    Types: Cigarettes  . Smokeless tobacco: Never Used  . Alcohol use No     Allergies   Monosodium glutamate; Red dye; Erythromycin; Penicillins; Statins; Sulfa antibiotics; Glipizide; and Iohexol   Review of Systems Review of Systems  Respiratory: Positive for shortness of breath and wheezing.   Cardiovascular: Negative for chest pain and palpitations.  Gastrointestinal: Positive for abdominal distention.  Skin: Negative for rash.  Allergic/Immunologic: Positive for immunocompromised state.  Hematological: Bruises/bleeds easily.     Physical Exam Updated Vital Signs BP (!) 106/59 (BP Location: Right Arm)   Pulse 82   Temp 98.4 F (36.9 C) (Oral)   Resp (!) 26   Wt 204 lb (92.5 kg)   SpO2 96%   BMI 35.02 kg/m   Physical Exam  Constitutional: Natalie Lane is oriented to person, place, and time. Natalie Lane appears well-developed and well-nourished.  HENT:  Head: Normocephalic and atraumatic.  Eyes: EOM are normal. Pupils are equal, round, and reactive to light.  Neck: Neck supple.  Cardiovascular: Normal rate and regular rhythm.   Murmur heard. Pulmonary/Chest: No respiratory distress. Natalie Lane has rales.  Pt has  bibasilar rales R > L  Abdominal: Soft. Natalie Lane exhibits distension. There is no tenderness. There is no rebound and no guarding.  Neurological: Natalie Lane is alert and oriented to person, place, and time.  Skin: Skin is warm and dry.  Nursing note and vitals reviewed.    ED Treatments / Results  Labs (all labs ordered are listed, but only abnormal results are displayed) Labs Reviewed  CBC WITH DIFFERENTIAL/PLATELET - Abnormal; Notable for the following:       Result Value   Hemoglobin 15.3 (*)    Monocytes Absolute 1.4 (*)    All other components within normal limits  PROTIME-INR - Abnormal; Notable for the following:    Prothrombin Time  16.4 (*)    All other components within normal limits  COMPREHENSIVE METABOLIC PANEL - Abnormal; Notable for the following:    Glucose, Bld 126 (*)    Total Protein 6.0 (*)    Albumin 3.1 (*)    AST 51 (*)    Total Bilirubin 2.0 (*)    All other components within normal limits  APTT - Abnormal; Notable for the following:    aPTT 44 (*)    All other components within normal limits  BRAIN NATRIURETIC PEPTIDE - Abnormal; Notable for the following:    B Natriuretic Peptide 114.7 (*)    All other components within normal limits  TROPONIN I    EKG  EKG Interpretation  Date/Time:  Monday May 10 2016 13:33:03 EDT Ventricular Rate:  139 PR Interval:    QRS Duration: 93 QT Interval:  327 QTC Calculation: 498 R Axis:   75 Text Interpretation:  Sinus tachycardia Low voltage, precordial leads Abnormal R-wave progression, early transition Repol abnrm suggests ischemia, anterior leads new TWI in v1-v4 TIW in inferior leads Confirmed by Kathrynn Humble, MD, Thelma Comp [phone]) on 05/10/2016 2:15:09 PM       Radiology Dg Chest 2 View  Result Date: 05/10/2016 CLINICAL DATA:  Difficulty breathing for 3 days. EXAM: CHEST  2 VIEW COMPARISON:  04/26/2016 FINDINGS: Low volume film. Interval progression of subsegmental atelectasis of both lung bases. No pulmonary edema or  pleural effusion. The cardiopericardial silhouette is within normal limits for size. Patient is status post median sternotomy. The visualized bony structures of the thorax are intact. IMPRESSION: Low volume film with slight progression of bibasilar atelectasis. Electronically Signed   By: Misty Stanley M.D.   On: 05/10/2016 14:17    Procedures Procedures (including critical care time)    EMERGENCY DEPARTMENT [place] CARDIAC EXAM "Study: Limited Ultrasound of the heart and pericardium"  INDICATIONS:Dyspnea Multiple views of the heart and pericardium were obtained in real-time with a multi-frequency probe.  PERFORMED XA:JOINOM  IMAGES ARCHIVED?: Yes  FINDINGS: No pericardial effusion, Normal contractility and Tamponade physiology absent  LIMITATIONS:  Body habitus, Emergent procedure and pt unable to lay supine  VIEWS USED: Parasternal long axis and Parasternal short axis  INTERPRETATION: Cardiac activity present, Pericardial effusioin absent, Cardiac tamponade absent and Normal contractility  CPT Code: [phone] (limited transthoracic cardiac)   Medications Ordered in ED Medications  furosemide (LASIX) injection 40 mg (not administered)     Initial Impression / Assessment and Plan / ED Course  I have reviewed the triage vital signs and the nursing notes.  Pertinent labs & imaging results that were available during my care of the patient were reviewed by me and considered in my medical decision making (see chart for details).     Pt comes in with cc of DIB. Natalie Lane has hx of DM, ascites due to cirrhosis. My initial gestalt was that pt is having high volume ascites, and the dib is directly related to that. However, pt's EKG has new TWI in the anterior leads with slight ST depression - and thus in a diabetic, elderly lady, I think differential diagnosis should be expanded to cardiac etiology as well. Natalie Lane last echo from last year was normal.  I called IR - they will get patient for  paracentesis at 9 am. I called Cards to place Natalie Lane on the list for evaluation due to new ekg changes - to see if Natalie Lane needs a stress test or outpatient f.u,  Medicine will admit the patient.  Final Clinical  Impressions(s) / ED Diagnoses   Final diagnoses:  Abnormal EKG  Dyspnea    New Prescriptions New Prescriptions   No medications on file     Varney Biles, MD 05/10/16 [place], MD 05/10/16 1816

## 2016-05-10 NOTE — ED Notes (Signed)
Pt HR back to the 80s, pt now in NAD.

## 2016-05-10 NOTE — H&P (Signed)
History and Physical    Natalie Lane ZSW:[id number] DOB: 21-Dec-1945 DOA: 05/10/2016  PCP: Reginia Naas, MD   I have briefly reviewed patients previous medical reports in [facility].  Patient coming from: Home  Chief Complaint: Progressive abdominal distention, dyspnea and chest pain 1  HPI: Natalie Lane is a 71 year old female, lives alone, independent of activities of daily living, PMH of probable VSD repair as a child, echo 2013 showed no shunt and normal LV/RV function, remote atrial flutter (Dr. 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 She had been seen in [place] ED on 04/26/16 for similar complaints of dyspnea and abdominal distention which were attributed to symptomatic ascites and she underwent therapeutic paracentesis of 4 L. The procedure provided mild relief. Since then she has had progressive dyspnea on exertion to a point where even taking a few steps causes dyspnea or talking fast causes dyspnea. She reports 2 pillow orthopnea. Mild leg edema. Minimal intermittent dry cough. No fever. Feels cold all the time. Had one episode of transient pain under the left breast 2 days ago when she was laying in bed. This was described as gnawing pain, 7/10 in intensity, no radiation, associated with diaphoresis, relieved after 10 minutes after she sat up and drank some water. No recurrence of chest pain. She states that she saw her GI M.D. after the paracentesis and was given some medications. Despite taking diuretics, sticking to no salt diet, she has noted progressive abdominal distension, weight gain 15-20 lbs over 1-2 weeks, intermittent abdominal pain at different sites, early satiety and no constipation or diarrhea. Prior to week ago, her last paracentesis was a year prior. Due to progressive  nature of her complaints, she presented to the ED.  ED Course: Lab work unremarkable, troponin 1 negative. INR 1.3 to. Chest x-ray shows low-volume film with slight progression of bibasilar atelectasis. EDP has consulted cardiology for evaluation of chest pain. EDP has also consulted and discussed with IR and is supposed to have paracentesis on 05/11/16 morning. Hospitalist admission was requested.  Review of Systems:  All other systems reviewed and apart from HPI, are negative.  Past Medical History:  Diagnosis Date  . Anemia   . Atrial flutter ([place])   . 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   . Cirrhosis ([place])   . Complication of anesthesia   . Diabetes mellitus, type 2 ([place])   . Dysrhythmia    AFlutter  . Edema   . GERD (gastroesophageal reflux disease)   . History of blood transfusion   . Hyperlipidemia   . Normal stress echocardiogram 2008  . PONV (postoperative nausea and vomiting)    with Cholecystectomy    Past Surgical History:  Procedure Laterality Date  . ABDOMINAL HYSTERECTOMY    . ASD versus VSD repair  1978   Operative notes not available.  . ATRIAL ABLATION SURGERY  10 /2011  . [place]   x2  . CHOLECYSTECTOMY    . CORONARY ARTERY BYPASS GRAFT  1978  . ESOPHAGOGASTRODUODENOSCOPY (EGD) WITH PROPOFOL N/A 11/16/2013   Procedure: ESOPHAGOGASTRODUODENOSCOPY (EGD) WITH PROPOFOL;  Surgeon: Lear Ng, MD;  Location: [place];  Service: Endoscopy;  Laterality: N/A;  . ESOPHAGOGASTRODUODENOSCOPY (EGD) WITH PROPOFOL N/A 01/23/2014  Procedure: ESOPHAGOGASTRODUODENOSCOPY (EGD) WITH PROPOFOL;  Surgeon: Lear Ng, MD;  Location: [place];  Service: Endoscopy;  Laterality: N/A;  . GASTRIC VARICES BANDING N/A 11/16/2013   Procedure: GASTRIC VARICES BANDING;  Surgeon: Lear Ng, MD;  Location: [place];  Service: Endoscopy;  Laterality: N/A;  . GASTRIC VARICES BANDING  N/A 01/23/2014   Procedure: GASTRIC VARICES BANDING;  Surgeon: Lear Ng, MD;  Location: [place];  Service: Endoscopy;  Laterality: N/A;  . HERNIA REPAIR    . OTHER SURGICAL HISTORY     partial hysterectomy    Social History  reports that she has quit smoking. Her smoking use included Cigarettes. She has a 18.00 pack-year smoking history. She has never used smokeless tobacco. She reports that she does not drink alcohol or use drugs.  Allergies  Allergen Reactions  . Other Anaphylaxis and Other (See Comments)    Pt is allergic to MSG.   . Red Dye Anaphylaxis  . Erythromycin Other (See Comments)    Reaction:  Migraine   . Penicillins Other (See Comments)    Reaction:  Migraine  Has patient had a PCN reaction causing immediate rash, facial/tongue/throat swelling, SOB or lightheadedness with hypotension: No Has patient had a PCN reaction causing severe rash involving mucus membranes or skin necrosis: No Has patient had a PCN reaction that required hospitalization No Has patient had a PCN reaction occurring within the last 10 years: No If all of the above answers are "NO", then may proceed with Cephalosporin use.  . Statins Other (See Comments)    Reaction:  Severe leg cramps   . Sulfa Antibiotics Hives  . Glipizide Rash  . Iohexol Rash    Family History  Problem Relation Age of Onset  . Hypertension Mother   . Kidney disease Sister   . Depression Paternal Grandfather   . Diabetes Brother      Prior to Admission medications   Medication Sig Start Date End Date Taking? Authorizing Provider  diphenhydrAMINE (BENADRYL) 25 MG tablet Take 25 mg by mouth every 6 (six) hours as needed for allergies.    Historical Provider, MD  docusate sodium (COLACE) 100 MG capsule Take 100 mg by mouth 2 (two) times daily as needed for mild constipation.    Historical Provider, MD  glimepiride (AMARYL) 2 MG tablet Take 1 tablet (2 mg total) by mouth 2 (two) times daily before a meal.  02/26/16   Philemon Kingdom, MD  ibuprofen (ADVIL,MOTRIN) 200 MG tablet Take 200 mg by mouth every 6 (six) hours as needed for headache, mild pain or moderate pain.     Historical Provider, MD  insulin NPH Human (HUMULIN N,NOVOLIN N) 100 UNIT/ML injection ReliOn brand - Inject 25 units in am and 20 units at night under skin Patient taking differently: Inject 20-25 Units into the skin 2 (two) times daily. ReliOn brand - Pt uses 25 units in the morning and 20 units at bedtime. 01/28/16   Philemon Kingdom, MD  loperamide (IMODIUM A-D) 2 MG tablet Take 2 mg by mouth 4 (four) times daily as needed for diarrhea or loose stools.    Historical Provider, MD  metFORMIN (GLUCOPHAGE-XR) 500 MG 24 hr tablet Take 1 tablet (500 mg total) by mouth 2 (two) times daily with a meal. 01/28/16   Philemon Kingdom, MD  pantoprazole (PROTONIX) 40 MG tablet Take 1 tablet (40 mg total) by mouth daily. 04/19/16   [name], MD  propranolol (INDERAL) 20 MG tablet Take 1 tablet (  20 mg total) by mouth 2 (two) times daily. 08/06/15   Burtis Junes, NP    Physical Exam: Vitals:   05/10/16 1400 05/10/16 1445 05/10/16 1500 05/10/16 1526  BP: 119/72 103/67 103/65 (!) 106/59  Pulse: (!) 111 81 82   Resp: (!) 23 20 (!) 24 (!) 26  Temp:    98.4 F (36.9 C)  TempSrc:    Oral  SpO2: 98% 92% 96%   Weight:          Constitutional: Pleasant middle-aged female, moderately built and overweight, sitting up comfortably in the gurney in the ED without distress. She does however gets easily short of breath with minimal activity, talking fast or lying supine. Eyes: PERTLA, lids and conjunctivae normal ENMT: Mucous membranes are moist. Posterior pharynx clear of any exudate or lesions. Normal dentition.  Neck: supple, no masses, no thyromegaly Respiratory: Diminished breath sounds in the bases with occasional fine basal crackles but otherwise clear to auscultation. Normal respiratory effort. No accessory muscle use.  Cardiovascular:  S1 & S2 heard, regular rate and rhythm, no murmurs / rubs / gallops. 1+ extremity edema. 2+ pedal pulses. No carotid bruits. Telemetry: Sinus rhythm.  Abdomen: Abdomen is moderately distended, not tense, soft without tenderness. No hepatosplenomegaly or masses appreciated . Bowel sounds normal. Lower anterior abdominal wall edema +. Ascites + +  Musculoskeletal: no clubbing / cyanosis. No joint deformity upper and lower extremities. Good ROM, no contractures. Normal muscle tone.  Skin: no rashes, lesions, ulcers. No induration Neurologic: CN 2-12 grossly intact. Sensation intact, DTR normal. Strength 5/5 in all 4 limbs.  Psychiatric: Normal judgment and insight. Alert and oriented x 3. Normal mood.     Labs on Admission: I have personally reviewed following labs and imaging studies  CBC:  Recent Labs Lab 05/10/16 1431  WBC 8.1  NEUTROABS 5.2  HGB 15.3*  HCT 44.8  MCV 99.3  PLT 854   Basic Metabolic Panel:  Recent Labs Lab 05/10/16 1431  NA 140  K 4.8  CL 104  CO2 25  GLUCOSE 126*  BUN 12  CREATININE 0.90  CALCIUM 9.0   Liver Function Tests:  Recent Labs Lab 05/10/16 1431  AST 51*  ALT 32  ALKPHOS 115  BILITOT 2.0*  PROT 6.0*  ALBUMIN 3.1*   Coagulation Profile:  Recent Labs Lab 05/10/16 1431  INR 1.32   Cardiac Enzymes:  Recent Labs Lab 05/10/16 1431  TROPONINI <0.03      Radiological Exams on Admission: Dg Chest 2 View  Result Date: 05/10/2016 CLINICAL DATA:  Difficulty breathing for 3 days. EXAM: CHEST  2 VIEW COMPARISON:  04/26/2016 FINDINGS: Low volume film. Interval progression of subsegmental atelectasis of both lung bases. No pulmonary edema or pleural effusion. The cardiopericardial silhouette is within normal limits for size. Patient is status post median sternotomy. The visualized bony structures of the thorax are intact. IMPRESSION: Low volume film with slight progression of bibasilar atelectasis. Electronically Signed   By: Misty Stanley M.D.   On: 05/10/2016 14:17    EKG: Independently reviewedSinus rhythm at 81 bpm, low voltage, normal axis, T-wave inversion in lead V1-V2 , slow R-wave progression  Assessment/Plan Principal Problem:   Ascites Active Problems:   Hyperlipidemia   Poorly controlled type 2 diabetes mellitus with peripheral neuropathy (HCC)   Chest pain at rest   Decompensated hepatic cirrhosis ([place])     1. 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 Ascitic fluid culture was negative and cytology showed reactive mesothelial cells. Since then she was seen by her outpatient GI. Despite diuretics, has rapidly reaccumulated due to unclear etiology. EDP has consulted IR and will have paracentesis on 3/27 morning. IV Lasix 40 mg 1 dose. Strict intake output and daily weights. Fluid restriction and low sodium diet. Will need Lasix and Spironolactone at discharge. Please consult patient's primary gastroenterologist/[facility] in a.m. for recommendations. 2. Dyspnea/acute respiratory failure with hypoxia: Patient saturating in the low 90s on room air. Likely secondary to ascites splinting her diaphragm. May also have an element of acute diastolic CHF. Check 2-D echo. IV Lasix. Management as problem #1. 3. Atypical chest pain: Had 1 episode 2 days ago. Troponin 1 negative. EKG without acute changes. EDP has consulted cardiology for further evaluation and management. Cycle troponins. 4. Type II DM with peripheral neuropathy: Hold oral medications. SSI. 5. Hyperlipidemia: Start home medications after pharmacy has reconciled her meds. 6. Remote atrial flutter: Resolved. No sustained arrhythmia per outpatient cardiology. Also not a candidate for anticoagulation due to cirrhosis and gastric varices. Monitor on telemetry.    DVT prophylaxis: SCDs  Code Status: Full  Family  Communication:  discussed in detail with patient's female friend at bedside.  Disposition Plan: DC home when medically improved and stable.   Consults called: EDP has consulted cardiology and IR.   Admission status: observation, telemetry.     [facility] MD [facility] Pager [phone]  If 7PM-7AM, please contact night-coverage [email or web address] Password [facility]  05/10/2016, 5:55 PM

## 2016-05-10 NOTE — ED Notes (Signed)
Pt sustained a HR of 140 and greater for several minutes. Pt is diaphoretic and SOB upon RN entering room. MD made aware, EKG captured.

## 2016-05-11 ENCOUNTER — Encounter (HOSPITAL_COMMUNITY): Payer: Self-pay

## 2016-05-11 ENCOUNTER — Inpatient Hospital Stay (HOSPITAL_COMMUNITY): Payer: Medicare Other

## 2016-05-11 ENCOUNTER — Observation Stay (HOSPITAL_COMMUNITY): Payer: Medicare Other

## 2016-05-11 DIAGNOSIS — I8500 Esophageal varices without bleeding: Secondary | ICD-10-CM | POA: Diagnosis present

## 2016-05-11 DIAGNOSIS — E1142 Type 2 diabetes mellitus with diabetic polyneuropathy: Secondary | ICD-10-CM | POA: Diagnosis not present

## 2016-05-11 DIAGNOSIS — J9621 Acute and chronic respiratory failure with hypoxia: Secondary | ICD-10-CM | POA: Diagnosis present

## 2016-05-11 DIAGNOSIS — K7290 Hepatic failure, unspecified without coma: Secondary | ICD-10-CM | POA: Diagnosis not present

## 2016-05-11 DIAGNOSIS — E1165 Type 2 diabetes mellitus with hyperglycemia: Secondary | ICD-10-CM | POA: Diagnosis not present

## 2016-05-11 DIAGNOSIS — K7469 Other cirrhosis of liver: Secondary | ICD-10-CM | POA: Diagnosis present

## 2016-05-11 DIAGNOSIS — Z6835 Body mass index (BMI) 35.0-35.9, adult: Secondary | ICD-10-CM | POA: Diagnosis not present

## 2016-05-11 DIAGNOSIS — Z880 Allergy status to penicillin: Secondary | ICD-10-CM | POA: Diagnosis not present

## 2016-05-11 DIAGNOSIS — Z841 Family history of disorders of kidney and ureter: Secondary | ICD-10-CM | POA: Diagnosis not present

## 2016-05-11 DIAGNOSIS — E8809 Other disorders of plasma-protein metabolism, not elsewhere classified: Secondary | ICD-10-CM | POA: Diagnosis present

## 2016-05-11 DIAGNOSIS — E785 Hyperlipidemia, unspecified: Secondary | ICD-10-CM | POA: Diagnosis present

## 2016-05-11 DIAGNOSIS — Z90710 Acquired absence of both cervix and uterus: Secondary | ICD-10-CM | POA: Diagnosis not present

## 2016-05-11 DIAGNOSIS — E669 Obesity, unspecified: Secondary | ICD-10-CM | POA: Diagnosis present

## 2016-05-11 DIAGNOSIS — D649 Anemia, unspecified: Secondary | ICD-10-CM | POA: Diagnosis present

## 2016-05-11 DIAGNOSIS — I471 Supraventricular tachycardia: Secondary | ICD-10-CM

## 2016-05-11 DIAGNOSIS — Z833 Family history of diabetes mellitus: Secondary | ICD-10-CM | POA: Diagnosis not present

## 2016-05-11 DIAGNOSIS — K219 Gastro-esophageal reflux disease without esophagitis: Secondary | ICD-10-CM | POA: Diagnosis present

## 2016-05-11 DIAGNOSIS — Z8249 Family history of ischemic heart disease and other diseases of the circulatory system: Secondary | ICD-10-CM | POA: Diagnosis not present

## 2016-05-11 DIAGNOSIS — R0789 Other chest pain: Secondary | ICD-10-CM | POA: Diagnosis present

## 2016-05-11 DIAGNOSIS — I4892 Unspecified atrial flutter: Secondary | ICD-10-CM | POA: Diagnosis present

## 2016-05-11 DIAGNOSIS — R9431 Abnormal electrocardiogram [ECG] [EKG]: Secondary | ICD-10-CM | POA: Diagnosis not present

## 2016-05-11 DIAGNOSIS — R0600 Dyspnea, unspecified: Secondary | ICD-10-CM | POA: Diagnosis not present

## 2016-05-11 DIAGNOSIS — Z87891 Personal history of nicotine dependence: Secondary | ICD-10-CM | POA: Diagnosis not present

## 2016-05-11 DIAGNOSIS — Q210 Ventricular septal defect: Secondary | ICD-10-CM | POA: Diagnosis not present

## 2016-05-11 DIAGNOSIS — Z951 Presence of aortocoronary bypass graft: Secondary | ICD-10-CM | POA: Diagnosis not present

## 2016-05-11 DIAGNOSIS — R079 Chest pain, unspecified: Secondary | ICD-10-CM | POA: Diagnosis not present

## 2016-05-11 DIAGNOSIS — I350 Nonrheumatic aortic (valve) stenosis: Secondary | ICD-10-CM | POA: Diagnosis present

## 2016-05-11 DIAGNOSIS — R188 Other ascites: Secondary | ICD-10-CM | POA: Diagnosis not present

## 2016-05-11 HISTORY — PX: IR GENERIC HISTORICAL: IMG1180011

## 2016-05-11 LAB — BASIC METABOLIC PANEL
Anion gap: 12 (ref 5–15)
BUN: 10 mg/dL (ref 6–20)
CO2: 23 mmol/L (ref 22–32)
Calcium: 9 mg/dL (ref 8.9–10.3)
Chloride: 103 mmol/L (ref 101–111)
Creatinine, Ser: 0.81 mg/dL (ref 0.44–1.00)
Glucose, Bld: 209 mg/dL — ABNORMAL HIGH (ref 65–99)
Potassium: 4.2 mmol/L (ref 3.5–5.1)
SODIUM: 138 mmol/L (ref 135–145)

## 2016-05-11 LAB — LACTATE DEHYDROGENASE, PLEURAL OR PERITONEAL FLUID: LD, Fluid: 44 U/L — ABNORMAL HIGH (ref 3–23)

## 2016-05-11 LAB — BODY FLUID CELL COUNT WITH DIFFERENTIAL
Eos, Fluid: 0 %
LYMPHS FL: 56 %
Monocyte-Macrophage-Serous Fluid: 35 % — ABNORMAL LOW (ref 50–90)
NEUTROPHIL FLUID: 9 % (ref 0–25)
WBC FLUID: 587 uL (ref 0–1000)

## 2016-05-11 LAB — GLUCOSE, PLEURAL OR PERITONEAL FLUID: GLUCOSE FL: 108 mg/dL

## 2016-05-11 LAB — GLUCOSE, CAPILLARY
GLUCOSE-CAPILLARY: 74 mg/dL (ref 65–99)
GLUCOSE-CAPILLARY: 125 mg/dL — AB (ref 65–99)
GLUCOSE-CAPILLARY: 184 mg/dL — AB (ref 65–99)
Glucose-Capillary: 129 mg/dL — ABNORMAL HIGH (ref 65–99)

## 2016-05-11 LAB — TROPONIN I: Troponin I: <0.03 ng/mL (ref <0.03)

## 2016-05-11 MED ORDER — ALBUMIN HUMAN 25 % IV SOLN
50.0000 g | Freq: Once | INTRAVENOUS | Status: AC
  Administered 2016-05-11: 50 g via INTRAVENOUS
  Filled 2016-05-11: qty 200

## 2016-05-11 MED ORDER — TECHNETIUM TO 99M ALBUMIN AGGREGATED
4.0000 | Freq: Once | INTRAVENOUS | Status: AC | PRN
  Administered 2016-05-11: 4 via INTRAVENOUS

## 2016-05-11 MED ORDER — TECHNETIUM TC 99M DIETHYLENETRIAME-PENTAACETIC ACID: 30.0000 | Freq: Once | INTRAVENOUS | Status: DC | PRN

## 2016-05-11 MED ORDER — LIDOCAINE HCL 1 % IJ SOLN
INTRAMUSCULAR | Status: AC
  Filled 2016-05-11: qty 20

## 2016-05-11 MED ORDER — LIDOCAINE HCL (PF) 1 % IJ SOLN
INTRAMUSCULAR | Status: DC | PRN
  Administered 2016-05-11: 5 mL

## 2016-05-11 NOTE — Procedures (Signed)
PROCEDURE SUMMARY:  Successful US guided paracentesis from RLQ.  Yielded 5.5 L of cloudy serosanguinous fluid.  No immediate complications.  Pt tolerated well.   Specimen was sent for labs.  Ascencion Dike PA-C 05/11/2016 10:02 AM

## 2016-05-11 NOTE — Progress Notes (Signed)
Progress Note  Patient Name: Natalie Lane Date of Encounter: 05/11/2016  Primary Cardiologist: Dr. Nur Rabold Martin[place]  Subjective   Pt just returned from paracentesis. Heart rate is elevated. She felt mildly lightheaded when up to the bathroom and feels tired. She feels fluttering in her chest. No chest pain. Her breathing is much improved after the procedure. She is confident that when her propranolol gets into her system it will calm down.  Inpatient Medications    Scheduled Meds: . furosemide  40 mg Intravenous Once  . furosemide  20 mg Oral Daily  . insulin aspart  0-9 Units Subcutaneous TID WC  . lidocaine      . pantoprazole  40 mg Oral Daily  . propranolol  20 mg Oral BID  . rifaximin  550 mg Oral BID  . sodium chloride flush  3 mL Intravenous Q12H  . sodium chloride flush  3 mL Intravenous Q12H   Continuous Infusions:  PRN Meds: sodium chloride, acetaminophen **OR** acetaminophen, albuterol, lidocaine (PF), ondansetron **OR** ondansetron (ZOFRAN) IV, simethicone, sodium chloride flush   Vital Signs    Vitals:   05/11/16 0935 05/11/16 0938 05/11/16 0941 05/11/16 0948  BP: (!) 90/51 (!) 98/50 (!) 98/50 (!) 95/47  Pulse:      Resp:      Temp:      TempSrc:      SpO2:      Weight:      Height:        Intake/Output Summary (Last 24 hours) at 05/11/16 1030 Last data filed at 05/11/16 0830  Gross per 24 hour  Intake              240 ml  Output              600 ml  Net             -360 ml   Filed Weights   05/10/16 1322 05/10/16 1943 05/11/16 0347  Weight: 204 lb (92.5 kg) 203 lb 1.6 oz (92.1 kg) 202 lb 4.8 oz (91.8 kg)    Telemetry    HR was down to the 70's overnight but currently ST 130's after paracentesis - Personally Reviewed  ECG    3/26 Sinus tachycardia 139 bpm non-specific ST T changes 3/27 Sinus tachycardia 133 bpm with non-specific ST/T chagnes - Personally Reviewed  Physical Exam   GEN: No acute distress.   Neck: No JVD Cardiac:  Tachycardic, no murmurs, rubs, or gallops.  Respiratory: Clear to auscultation bilaterally except few fine crackles in posterior bases. GI: Soft, nontender MS: No edema; No deformity. Neuro:  Nonfocal  Psych: Normal affect   Labs    Chemistry Recent Labs Lab 05/10/16 1431  NA 140  K 4.8  CL 104  CO2 25  GLUCOSE 126*  BUN 12  CREATININE 0.90  CALCIUM 9.0  PROT 6.0*  ALBUMIN 3.1*  AST 51*  ALT 32  ALKPHOS 115  BILITOT 2.0*  GFRNONAA >60  GFRAA >60  ANIONGAP 11     Hematology Recent Labs Lab 05/10/16 1431  WBC 8.1  RBC 4.51  HGB 15.3*  HCT 44.8  MCV 99.3  MCH 33.9  MCHC 34.2  RDW 14.1  PLT 204    Cardiac Enzymes Recent Labs Lab 05/10/16 1431 05/10/16 1953 05/11/16 0321  TROPONINI <0.03 <0.03 <0.03   No results for input(s): TROPIPOC in the last 168 hours.   BNP Recent Labs Lab 05/10/16 1431  BNP 114.7*  DDimer  Recent Labs Lab 05/10/16 1953  DDIMER >20.00*     Radiology    Dg Chest 2 View  Result Date: 05/10/2016 CLINICAL DATA:  Difficulty breathing for 3 days. EXAM: CHEST  2 VIEW COMPARISON:  04/26/2016 FINDINGS: Low volume film. Interval progression of subsegmental atelectasis of both lung bases. No pulmonary edema or pleural effusion. The cardiopericardial silhouette is within normal limits for size. Patient is status post median sternotomy. The visualized bony structures of the thorax are intact. IMPRESSION: Low volume film with slight progression of bibasilar atelectasis. Electronically Signed   By: Misty Stanley M.D.   On: 05/10/2016 14:17    Cardiac Studies   Echo pending  Patient Profile     71 y.o. female seen at the request of Dr. Lindaann Pascal for evaluation of dyspnea. She has a remote history of atrial flutter. 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 She had a normal stress Echo in the past. Echo in 2013 showed no shunt and normal LV/RV function. 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 She  is s/p banding. Due to her varices she was switched from metoprolol to propranolol.  Assessment & Plan    Acute on chronic respiratory failure with hypoxia. -Hx of remote VSD repair -CXR and BNP not indicative of CHF. -Is followed by GI for decompensated cryptogenic cirrhosis with worsening ascities. -Suspect symptoms related to severe ascites. GI had increased her lasix and added sprironolactone but it appears that the full dosing was not accomplished. Ultrasound guided paracentesis done this am yielding 5.5 liters. Breathing is much improved. -Post procedure she is tachycardic in the 130's. She had been tachycardic in the 140's on admission, but came down to the 70's. She has not had her am meds including propranolol. -GI recommends spironolactone 100 mg and lasix 40 mg daily. -IM has plans for discharge this afternoon if heart rate normalizes.  History of atrial flutter -heart rate elevated to the 140's on admission thought to be related to respiratory failure. HR came down to the 70's. -This morning post paracentesis, heart rate back up to 130's. She is lightheaded when up and tired, feels fluttering. No chest pain. She had not had her morning meds including propranolol. Nurse instructed to give.  -HR was up for just over 30 minutes and now back down to the 70's abruptly.  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 -GI recommends spironolactone 100 mg and lasix 40 mg daily.   Signed, Daune Perch, NP  05/11/2016, 10:30 AM    Patient seen and examined and history reviewed. Agree with above findings and plan. I reviewed telemetry. She is clearly having some PAT this am post paracentesis. Now back in NSR. She is on chronic propranolol for varices. Unfortunately we don't have much room to increase dose due to low BP. She states her HR is generally well controlled at home. I think the stress of her breathing and volume shift with paracentesis is  probably exacerbating her arrhythmia. I have recommended continuing on same dose of propranolol but instructed her to take an extra dose PRN for elevated HR. Echo is still pending.  Ameka Krigbaum [place], [place] 05/11/2016 11:40 AM

## 2016-05-11 NOTE — Consult Note (Signed)
Referring Provider: Dr. Algis Liming Primary Care Physician:  Reginia Naas, MD Primary Gastroenterologist:  Dr. Michail Sermon  Reason for Consultation:  Ascites; Cirrhosis  HPI: Natalie Lane is a 71 y.o. female with cryptogenic cirrhosis that who has been having refractory ascites. She has been having dyspnea on exertion for the past 2 weeks. Had left-sided chest pain 2 days prior to admit. She had a therapeutic paracentesis on 04/26/16 with 4.3 L removed. She saw me in the office on 05/03/16 and I increased her Lasix to 40 mg/day from 20 mg/day and added Spironolactone 100 mg/day. She states that the pharmacist told her not to take them together but our office never was informed of that. She took the Spironolactone for 2 days and did not increase the Lasix dose. She had worsened shortness of breath last week and worsening abdominal distention. Having lower abdominal pain as well. Following a low sodium diet. Does not drink alcohol.  Past Medical History:  Diagnosis Date  . Anemia   . Atrial flutter ([place])   . 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   . CHF (congestive heart failure) ([place])   . Cirrhosis ([place])   . Complication of anesthesia   . Diabetes mellitus, type 2 ([place])   . Dyspnea   . Dysrhythmia    AFlutter  . Edema   . GERD (gastroesophageal reflux disease)   . History of blood transfusion   . Hyperlipidemia   . Normal stress echocardiogram 2008  . PONV (postoperative nausea and vomiting)    with Cholecystectomy    Past Surgical History:  Procedure Laterality Date  . ABDOMINAL HYSTERECTOMY    . ASD versus VSD repair  1978   Operative notes not available.  . ATRIAL ABLATION SURGERY  10 /2011  . [place]   x2  . CHOLECYSTECTOMY    . CORONARY ARTERY BYPASS GRAFT  1978  . ESOPHAGOGASTRODUODENOSCOPY (EGD) WITH PROPOFOL N/A 11/16/2013   Procedure: ESOPHAGOGASTRODUODENOSCOPY (EGD) WITH PROPOFOL;  Surgeon:  Lear Ng, MD;  Location: [facility];  Service: Endoscopy;  Laterality: N/A;  . ESOPHAGOGASTRODUODENOSCOPY (EGD) WITH PROPOFOL N/A 01/23/2014   Procedure: ESOPHAGOGASTRODUODENOSCOPY (EGD) WITH PROPOFOL;  Surgeon: Lear Ng, MD;  Location: [facility];  Service: Endoscopy;  Laterality: N/A;  . GASTRIC VARICES BANDING N/A 11/16/2013   Procedure: GASTRIC VARICES BANDING;  Surgeon: Lear Ng, MD;  Location: [facility];  Service: Endoscopy;  Laterality: N/A;  . GASTRIC VARICES BANDING N/A 01/23/2014   Procedure: GASTRIC VARICES BANDING;  Surgeon: Lear Ng, MD;  Location: [facility];  Service: Endoscopy;  Laterality: N/A;  . HERNIA REPAIR    . OTHER SURGICAL HISTORY     partial hysterectomy    Prior to Admission medications   Medication Sig Start Date End Date Taking? Authorizing Provider  diphenhydrAMINE (BENADRYL) 25 MG tablet Take 25 mg by mouth every 6 (six) hours as needed for allergies.   Yes Historical Provider, MD  docusate sodium (COLACE) 100 MG capsule Take 100 mg by mouth 2 (two) times daily as needed for mild constipation.   Yes Historical Provider, MD  furosemide (LASIX) 20 MG tablet Take 20 mg by mouth daily.   Yes Historical Provider, MD  glimepiride (AMARYL) 2 MG tablet Take 1 tablet (2 mg total) by mouth 2 (two) times daily before a meal. Patient taking differently: Take 2 mg by mouth 2 (two) times daily.  02/26/16  Yes Salena Saner  Cruzita Lederer, MD  ibuprofen (ADVIL,MOTRIN) 200 MG tablet Take 200 mg by mouth every 6 (six) hours as needed for headache, mild pain or moderate pain.    Yes Historical Provider, MD  insulin NPH Human (HUMULIN N,NOVOLIN N) 100 UNIT/ML injection ReliOn brand - Inject 25 units in am and 20 units at night under skin Patient taking differently: Inject 20-25 Units into the skin 2 (two) times daily. ReliOn brand - Pt uses 25 units in the morning and 20 units at bedtime. 01/28/16  Yes Philemon Kingdom, MD  lactulose (CHRONULAC)  10 GM/15ML solution Take by mouth 2 (two) times daily.   Yes Historical Provider, MD  loperamide (IMODIUM A-D) 2 MG tablet Take 2 mg by mouth 4 (four) times daily as needed for diarrhea or loose stools.   Yes Historical Provider, MD  meloxicam (MOBIC) 15 MG tablet Take 15 mg by mouth daily.   Yes Historical Provider, MD  metFORMIN (GLUCOPHAGE-XR) 500 MG 24 hr tablet Take 1 tablet (500 mg total) by mouth 2 (two) times daily with a meal. 01/28/16  Yes Philemon Kingdom, MD  propranolol (INDERAL) 20 MG tablet Take 1 tablet (20 mg total) by mouth 2 (two) times daily. 08/06/15  Yes Burtis Junes, NP  ranitidine (ZANTAC) 150 MG capsule Take 150 mg by mouth daily as needed for heartburn.   Yes Historical Provider, MD  rifaximin (XIFAXAN) 550 MG TABS tablet Take 550 mg by mouth 2 (two) times daily.   Yes Historical Provider, MD  simethicone (MYLICON) 80 MG chewable tablet Chew 80 mg by mouth 4 (four) times daily as needed for flatulence.   Yes Historical Provider, MD    Scheduled Meds: . furosemide  40 mg Intravenous Once  . furosemide  20 mg Oral Daily  . insulin aspart  0-9 Units Subcutaneous TID WC  . pantoprazole  40 mg Oral Daily  . propranolol  20 mg Oral BID  . rifaximin  550 mg Oral BID  . sodium chloride flush  3 mL Intravenous Q12H  . sodium chloride flush  3 mL Intravenous Q12H   Continuous Infusions: PRN Meds:.sodium chloride, acetaminophen **OR** acetaminophen, albuterol, ondansetron **OR** ondansetron (ZOFRAN) IV, simethicone, sodium chloride flush  Allergies as of 05/10/2016 - Review Complete 05/10/2016  Allergen Reaction Noted  . Monosodium glutamate Anaphylaxis 05/10/2016  . Red dye Anaphylaxis 06/25/2010  . Erythromycin Other (See Comments) 06/25/2010  . Penicillins Other (See Comments) 06/25/2010  . Statins Other (See Comments) 01/23/2014  . Sulfa antibiotics Hives 08/30/2010  . Glipizide Rash 09/19/2014  . Iohexol Rash 09/01/2011  . Spironolactone Palpitations and Other  (See Comments) 05/10/2016    Family History  Problem Relation Age of Onset  . Hypertension Mother   . Kidney disease Sister   . Depression Paternal Grandfather   . Diabetes Brother     Social History   Social History  . Marital status: Married    Spouse name: N/A  . Number of children: 3  . Years of education: N/A   Occupational History  . Office management [employer]   Social History Main Topics  . Smoking status: Former Smoker    Packs/day: 0.50    Years: 36.00    Types: Cigarettes  . Smokeless tobacco: Never Used  . Alcohol use No  . Drug use: No  . Sexual activity: Not on file   Other Topics Concern  . Not on file   Social History Narrative  . No narrative on file    Review of  Systems: All negative except as stated above in HPI.  Physical Exam: Vital signs: Vitals:   05/11/16 0347 05/11/16 0852  BP: 99/64 119/67  Pulse: 82   Resp: 20   Temp: 97.9 F (36.6 C)    Last BM Date: 05/10/16 General:   Lethargic, Well-developed, well-nourished, pleasant and cooperative in NAD HEENT: anicteric sclera Neck: supple, nontender Lungs:  Clear throughout to auscultation.   No wheezes, crackles, or rhonchi. No acute distress. Heart:  Regular rate and rhythm; no murmurs, clicks, rubs,  or gallops. Abdomen: significant distention with diffuse guarding, +BS Rectal:  Deferred Ext: no LE edema Neuro: oriented Skin: no jaundice  GI:  Lab Results:  Recent Labs  05/10/16 1431  WBC 8.1  HGB 15.3*  HCT 44.8  PLT 204   BMET  Recent Labs  05/10/16 1431  NA 140  K 4.8  CL 104  CO2 25  GLUCOSE 126*  BUN 12  CREATININE 0.90  CALCIUM 9.0   LFT  Recent Labs  05/10/16 1431  PROT 6.0*  ALBUMIN 3.1*  AST 51*  ALT 32  ALKPHOS 115  BILITOT 2.0*   PT/INR  Recent Labs  05/10/16 1431  LABPROT 16.4*  INR 1.32     Studies/Results: Dg Chest 2 View  Result Date: 05/10/2016 CLINICAL DATA:  Difficulty breathing for 3 days. EXAM: CHEST  2 VIEW  COMPARISON:  04/26/2016 FINDINGS: Low volume film. Interval progression of subsegmental atelectasis of both lung bases. No pulmonary edema or pleural effusion. The cardiopericardial silhouette is within normal limits for size. Patient is status post median sternotomy. The visualized bony structures of the thorax are intact. IMPRESSION: Low volume film with slight progression of bibasilar atelectasis. Electronically Signed   By: Misty Stanley M.D.   On: 05/10/2016 14:17    Impression/Plan: 71 yo with decompensated cryptogenic cirrhosis with worsening ascites and in need of a therapeutic and diagnostic paracentesis. Seen by Dr. Martinique of cards and their plan noted. Advised to take a max of 6L off and order for IV Albumin given. Ascitic fluid to be sent for studies to check for SBP. Will need combination diuretic therapy at a minimum of Spironolactone 100 mg/day and Lasix 40 mg/day. Continue supportive care. Will follow.    LOS: 0 days   Searcy C.  05/11/2016, 8:57 AM  Pager [phone]  AFTER 5 pm or on weekends please call [phone]

## 2016-05-11 NOTE — Progress Notes (Signed)
[facility] PROGRESS NOTE  Natalie Lane JXB:[id number] DOB: Aug 21, 1945 DOA: 05/10/2016  PCP: Reginia Naas, MD  Brief History/Interval Summary: 71 year old Caucasian female who lives alone and was a past medical history of congenital heart disease, likely VSD, history of atrial flutter, cryptogenic cirrhosis with gastric varices status post banding. She is followed by Dr. Michail Sermon with gastroenterology. She presented with the complaints of worsening abdominal distention, dyspnea, and a transient episode of chest pain. Patient was hospitalized for further management. Gastroenterology and cardiology were consulted. Patient underwent paracentesis.   Reason for Visit: Ascites. Atrial flutter.  Consultants: Cardiology. Gastroenterology.  Procedures: Paracentesis  Antibiotics: None  Subjective/Interval History: Patient seen after she underwent paracentesis. She feels better and states that she is able to breathe better. Denies any chest pain or lightheadedness currently.  ROS: Denies any nausea or vomiting  Objective:  Vital Signs  Vitals:   05/11/16 0938 05/11/16 0941 05/11/16 0948 05/11/16 1057  BP: (!) 98/50 (!) 98/50 (!) 95/47 (!) 99/44  Pulse:    (!) 134  Resp:    (!) 22  Temp:    98.1 F (36.7 C)  TempSrc:    Oral  SpO2:    97%  Weight:      Height:        Intake/Output Summary (Last 24 hours) at 05/11/16 1249 Last data filed at 05/11/16 1057  Gross per 24 hour  Intake              240 ml  Output             1300 ml  Net            -1060 ml   Filed Weights   05/10/16 1322 05/10/16 1943 05/11/16 0347  Weight: 92.5 kg (204 lb) 92.1 kg (203 lb 1.6 oz) 91.8 kg (202 lb 4.8 oz)    General appearance: alert, cooperative, appears stated age and no distress Resp: clear to auscultation bilaterally Cardio: His one is still noted to be tachycardic, regular. No S3, S4. No rubs, murmurs or bruit. Minimal pedal edema. GI: Somewhat distended. Nontender. No  masses or organomegaly. Bowel sounds present. Extremities: Mildly erythematous. No erythema. Does have calf tenderness bilaterally Skin: Skin color, texture, turgor normal. No rashes or lesions Neurologic: Awake, alert. Oriented 3. No focal neurologic deficits.  Lab Results:  Data Reviewed: I have personally reviewed following labs and imaging studies  CBC:  Recent Labs Lab 05/10/16 1431  WBC 8.1  NEUTROABS 5.2  HGB 15.3*  HCT 44.8  MCV 99.3  PLT 130    Basic Metabolic Panel:  Recent Labs Lab 05/10/16 1431  NA 140  K 4.8  CL 104  CO2 25  GLUCOSE 126*  BUN 12  CREATININE 0.90  CALCIUM 9.0    GFR: Estimated Creatinine Clearance: 64.7 mL/min (by C-G formula based on SCr of 0.9 mg/dL).  Liver Function Tests:  Recent Labs Lab 05/10/16 1431  AST 51*  ALT 32  ALKPHOS 115  BILITOT 2.0*  PROT 6.0*  ALBUMIN 3.1*    Coagulation Profile:  Recent Labs Lab 05/10/16 1431  INR 1.32    Cardiac Enzymes:  Recent Labs Lab 05/10/16 1431 05/10/16 1953 05/11/16 0321  TROPONINI <0.03 <0.03 <0.03    CBG:  Recent Labs Lab 05/10/16 2230 05/11/16 0641 05/11/16 1055  GLUCAP 115* 74 125*     Radiology Studies: Dg Chest 2 View  Result Date: 05/10/2016 CLINICAL DATA:  Difficulty breathing for 3 days. EXAM: CHEST  2 VIEW COMPARISON:  04/26/2016 FINDINGS: Low volume film. Interval progression of subsegmental atelectasis of both lung bases. No pulmonary edema or pleural effusion. The cardiopericardial silhouette is within normal limits for size. Patient is status post median sternotomy. The visualized bony structures of the thorax are intact. IMPRESSION: Low volume film with slight progression of bibasilar atelectasis. Electronically Signed   By: Misty Stanley M.D.   On: 05/10/2016 14:17   Ir Paracentesis  Result Date: 05/11/2016 INDICATION: Abdominal distention secondary to recurrent ascites. Request diagnostic and therapeutic paracentesis. EXAM: ULTRASOUND  GUIDED RIGHT LOWER QUADRANT PARACENTESIS MEDICATIONS: None. COMPLICATIONS: None immediate. PROCEDURE: Informed written consent was obtained from the patient after a discussion of the risks, benefits and alternatives to treatment. A timeout was performed prior to the initiation of the procedure. Initial ultrasound scanning demonstrates a large amount of ascites within the right lower abdominal quadrant. The right lower abdomen was prepped and draped in the usual sterile fashion. 1% lidocaine was used for local anesthesia. Following this, a 19 gauge, 7-cm, Yueh catheter was introduced. An ultrasound image was saved for documentation purposes. The paracentesis was performed. The catheter was removed and a dressing was applied. The patient tolerated the procedure well without immediate post procedural complication. FINDINGS: A total of approximately 5.5 L of cloudy serosanguineous fluid was removed. Samples were sent to the laboratory as requested by the clinical team. IMPRESSION: Successful ultrasound-guided paracentesis yielding 5.5 liters of peritoneal fluid. Read by: Ascencion Dike PA-C Electronically Signed   By: Markus Daft M.D.   On: 05/11/2016 10:02     Medications:  Scheduled: . furosemide  40 mg Intravenous Once  . furosemide  20 mg Oral Daily  . insulin aspart  0-9 Units Subcutaneous TID WC  . lidocaine      . pantoprazole  40 mg Oral Daily  . propranolol  20 mg Oral BID  . rifaximin  550 mg Oral BID  . sodium chloride flush  3 mL Intravenous Q12H  . sodium chloride flush  3 mL Intravenous Q12H   Continuous:  JAS:NKNLZJ chloride, acetaminophen **OR** acetaminophen, albuterol, lidocaine (PF), ondansetron **OR** ondansetron (ZOFRAN) IV, simethicone, sodium chloride flush  Assessment/Plan:  Principal Problem:   Ascites Active Problems:   Hyperlipidemia   Poorly controlled type 2 diabetes mellitus with peripheral neuropathy (HCC)   Chest pain at rest   Decompensated hepatic cirrhosis  (HCC)    Decompensated Cryptogenic cirrhosis (with known gastric varices) complicated by symptomatic recurrent ascites Patient is status post paracentesis this morning. Cell count does not suggest SBP. Her previous paracentesis was on 3/12 and 4 L were removed. Ascitic fluid culture was negative and cytology showed reactive mesothelial cells. Since then she was seen by her outpatient GI. Despite diuretics, has rapidly reaccumulated due to unclear etiology. Gastroenterology is following. She is on diuretics. Strict ins and outs and daily weights.   Atrial flutter with RVR Noted to have tachycardia this morning on examination. Telemetry shows heart rate in the 130s. EKG is been read as sinus tach with probably is aflutter. Cardiology notified. Apparently she converted to sinus rhythm after 19mins. Continue with her home dose of beta blocker. Not thought to be an anticoagulation candidate, presumably due to presence of gastric paresis.  Dyspnea/acute respiratory failure with hypoxia It was felt that her respiratory symptoms were due to ascites. Echocardiogram is pending. She was given IV Lasix. Marland Kitchen   Atypical chest pain This was a transient episode about 2 days ago. EKG did not show any  ischemic changes. Troponins have been negative. Cardiology is following. D-dimer was checked and it significantly elevated. There is mention of allergy to contrast. We will proceed with VQ scan and lower extremity venous Dopplers  Type II DM with peripheral neuropathy Hold oral medications. SSI.  DVT Prophylaxis: SCDs    Code Status: Full code  Family Communication: Discussed with the patient  Disposition Plan: Management as outlined above. PT evaluation when more stable. Labs are pending from today.    LOS: 0 days   [facility] Pager [phone] 05/11/2016, 12:49 PM  If 7PM-7AM, please contact night-coverage at [email or web address], password [facility]

## 2016-05-12 ENCOUNTER — Inpatient Hospital Stay (HOSPITAL_COMMUNITY): Payer: Medicare Other

## 2016-05-12 DIAGNOSIS — K7290 Hepatic failure, unspecified without coma: Secondary | ICD-10-CM

## 2016-05-12 DIAGNOSIS — R079 Chest pain, unspecified: Secondary | ICD-10-CM

## 2016-05-12 DIAGNOSIS — R9431 Abnormal electrocardiogram [ECG] [EKG]: Secondary | ICD-10-CM

## 2016-05-12 DIAGNOSIS — R0600 Dyspnea, unspecified: Secondary | ICD-10-CM

## 2016-05-12 DIAGNOSIS — I471 Supraventricular tachycardia: Secondary | ICD-10-CM | POA: Diagnosis present

## 2016-05-12 DIAGNOSIS — R188 Other ascites: Secondary | ICD-10-CM

## 2016-05-12 LAB — BASIC METABOLIC PANEL
Anion gap: 7 (ref 5–15)
BUN: 12 mg/dL (ref 6–20)
CO2: 25 mmol/L (ref 22–32)
CREATININE: 0.77 mg/dL (ref 0.44–1.00)
Calcium: 8.5 mg/dL — ABNORMAL LOW (ref 8.9–10.3)
Chloride: 104 mmol/L (ref 101–111)
GFR calc Af Amer: >60 mL/min (ref >60)
GFR calc non Af Amer: >60 mL/min (ref >60)
GLUCOSE: 179 mg/dL — AB (ref 65–99)
Potassium: 4.3 mmol/L (ref 3.5–5.1)
SODIUM: 136 mmol/L (ref 135–145)

## 2016-05-12 LAB — CBC
HCT: 39.1 % (ref 36.0–46.0)
HEMOGLOBIN: 12.9 g/dL (ref 12.0–15.0)
MCH: 32.7 pg (ref 26.0–34.0)
MCHC: 33 g/dL (ref 30.0–36.0)
MCV: 99.2 fL (ref 78.0–100.0)
Platelets: 167 10*3/uL (ref 150–400)
RBC: 3.94 MIL/uL (ref 3.87–5.11)
RDW: 13.9 % (ref 11.5–15.5)
WBC: 6.7 10*3/uL (ref 4.0–10.5)

## 2016-05-12 LAB — GLUCOSE, CAPILLARY
GLUCOSE-CAPILLARY: 171 mg/dL — AB (ref 65–99)
Glucose-Capillary: 237 mg/dL — ABNORMAL HIGH (ref 65–99)

## 2016-05-12 LAB — PATHOLOGIST SMEAR REVIEW

## 2016-05-12 LAB — ECHOCARDIOGRAM COMPLETE
HEIGHTINCHES: 64 in
WEIGHTICAEL: 3248 [oz_av]

## 2016-05-12 MED ORDER — SPIRONOLACTONE 100 MG PO TABS: 100.0000 mg | ORAL_TABLET | Freq: Every day | ORAL | 0 refills | Status: DC

## 2016-05-12 MED ORDER — FUROSEMIDE 40 MG PO TABS: 40.0000 mg | ORAL_TABLET | Freq: Every day | ORAL | 0 refills | Status: DC

## 2016-05-12 MED ORDER — PERFLUTREN LIPID MICROSPHERE
1.0000 mL | INTRAVENOUS | Status: AC | PRN
Start: 2016-05-12 — End: 2016-05-12
  Administered 2016-05-12: 2 mL via INTRAVENOUS
  Filled 2016-05-12: qty 10

## 2016-05-12 NOTE — Progress Notes (Signed)
Pt w/ IV d/c'd and all tele electrodes off. Pt dressed at sitting in chair. D/C instructions reviewed to include f/u appt, all medications, prescriptions sent to [facility], diet and activity. Pt verbalized understanding and denied any further questions. Pt awaiting ride to get here. Pt to be transported to exit via w/c.--JM

## 2016-05-12 NOTE — Progress Notes (Signed)
Progress Note  Patient Name: Natalie Lane Date of Encounter: 05/12/2016  Primary Cardiologist: Dr. Peter Martinique  Subjective   Patient is feeling much better. Less SOB. Notes minor palpitations. No dizziness.   Inpatient Medications    Scheduled Meds: . furosemide  40 mg Intravenous Once  . furosemide  20 mg Oral Daily  . insulin aspart  0-9 Units Subcutaneous TID WC  . pantoprazole  40 mg Oral Daily  . propranolol  20 mg Oral BID  . rifaximin  550 mg Oral BID  . sodium chloride flush  3 mL Intravenous Q12H  . sodium chloride flush  3 mL Intravenous Q12H   Continuous Infusions:  PRN Meds: sodium chloride, acetaminophen **OR** acetaminophen, albuterol, lidocaine (PF), ondansetron **OR** ondansetron (ZOFRAN) IV, simethicone, sodium chloride flush, technetium TC 97M diethylenetriame-pentaacetic acid   Vital Signs    Vitals:   05/11/16 1057 05/11/16 1352 05/11/16 2025 05/12/16 0545  BP: (!) 99/44 (!) 83/55 (!) 94/51 (!) 101/50  Pulse: (!) 134 (!) 103 76 77  Resp: (!) 22 20 20 18   Temp: 98.1 F (36.7 C) 98.2 F (36.8 C) 98.1 F (36.7 C) 98.2 F (36.8 C)  TempSrc: Oral Oral Oral Oral  SpO2: 97% 96% 96% 94%  Weight:    203 lb (92.1 kg)  Height:        Intake/Output Summary (Last 24 hours) at 05/12/16 1056 Last data filed at 05/12/16 0900  Gross per 24 hour  Intake              840 ml  Output             2600 ml  Net            -1760 ml   Filed Weights   05/10/16 1943 05/11/16 0347 05/12/16 0545  Weight: 203 lb 1.6 oz (92.1 kg) 202 lb 4.8 oz (91.8 kg) 203 lb (92.1 kg)    Telemetry    NSR with short bursts of PAT- Personally Reviewed  ECG    None today  Physical Exam   GEN: No acute distress.   Neck: No JVD Cardiac: RRR, no murmurs, rubs, or gallops.  Respiratory: Clear to auscultation bilaterally except few fine crackles in posterior bases. GI: Soft, nontender MS: No edema; No deformity. Neuro:  Nonfocal  Psych: Normal affect   Labs      Chemistry  Recent Labs Lab 05/10/16 1431 05/11/16 1234 05/12/16 0253  NA 140 138 136  K 4.8 4.2 4.3  CL 104 103 104  CO2 25 23 25   GLUCOSE 126* 209* 179*  BUN 12 10 12   CREATININE 0.90 0.81 0.77  CALCIUM 9.0 9.0 8.5*  PROT 6.0*  --   --   ALBUMIN 3.1*  --   --   AST 51*  --   --   ALT 32  --   --   ALKPHOS 115  --   --   BILITOT 2.0*  --   --   GFRNONAA >60 >60 >60  GFRAA >60 >60 >60  ANIONGAP 11 12 7      Hematology  Recent Labs Lab 05/10/16 1431 05/12/16 0253  WBC 8.1 6.7  RBC 4.51 3.94  HGB 15.3* 12.9  HCT 44.8 39.1  MCV 99.3 99.2  MCH 33.9 32.7  MCHC 34.2 33.0  RDW 14.1 13.9  PLT 204 167    Cardiac Enzymes  Recent Labs Lab 05/10/16 1431 05/10/16 1953 05/11/16 0321 05/11/16 1234  TROPONINI <0.03 <0.03 <0.03 <  0.03   No results for input(s): TROPIPOC in the last 168 hours.   BNP  Recent Labs Lab 05/10/16 1431  BNP 114.7*     DDimer   Recent Labs Lab 05/10/16 1953  DDIMER >20.00*     Radiology    Dg Chest 2 View  Result Date: 05/10/2016 CLINICAL DATA:  Difficulty breathing for 3 days. EXAM: CHEST  2 VIEW COMPARISON:  04/26/2016 FINDINGS: Low volume film. Interval progression of subsegmental atelectasis of both lung bases. No pulmonary edema or pleural effusion. The cardiopericardial silhouette is within normal limits for size. Patient is status post median sternotomy. The visualized bony structures of the thorax are intact. IMPRESSION: Low volume film with slight progression of bibasilar atelectasis. Electronically Signed   By: Misty Stanley M.D.   On: 05/10/2016 14:17   Nm Pulmonary Perf And Vent  Result Date: 05/11/2016 CLINICAL DATA:  Dyspnea EXAM: NUCLEAR MEDICINE VENTILATION - PERFUSION LUNG SCAN TECHNIQUE: Ventilation images were obtained in multiple projections using inhaled aerosol Tc-58m DTPA. Perfusion images were obtained in multiple projections after intravenous injection of Tc-60m MAA. RADIOPHARMACEUTICALS:  32 mCi  Technetium-56m DTPA aerosol inhalation and 4.2 mCi Technetium-79m MAA IV COMPARISON:  None. FINDINGS: Ventilation: No focal ventilation defect. Mild central deposition of radiotracer as can be seen with low airways disease. Perfusion: No wedge shaped peripheral perfusion defects to suggest acute pulmonary embolism. IMPRESSION: No evidence of pulmonary embolus. Electronically Signed   By: Kathreen Devoid   On: 05/11/2016 15:55   Ir Paracentesis  Result Date: 05/11/2016 INDICATION: Abdominal distention secondary to recurrent ascites. Request diagnostic and therapeutic paracentesis. EXAM: ULTRASOUND GUIDED RIGHT LOWER QUADRANT PARACENTESIS MEDICATIONS: None. COMPLICATIONS: None immediate. PROCEDURE: Informed written consent was obtained from the patient after a discussion of the risks, benefits and alternatives to treatment. A timeout was performed prior to the initiation of the procedure. Initial ultrasound scanning demonstrates a large amount of ascites within the right lower abdominal quadrant. The right lower abdomen was prepped and draped in the usual sterile fashion. 1% lidocaine was used for local anesthesia. Following this, a 19 gauge, 7-cm, Yueh catheter was introduced. An ultrasound image was saved for documentation purposes. The paracentesis was performed. The catheter was removed and a dressing was applied. The patient tolerated the procedure well without immediate post procedural complication. FINDINGS: A total of approximately 5.5 L of cloudy serosanguineous fluid was removed. Samples were sent to the laboratory as requested by the clinical team. IMPRESSION: Successful ultrasound-guided paracentesis yielding 5.5 liters of peritoneal fluid. Read by: Ascencion Dike PA-C Electronically Signed   By: Markus Daft M.D.   On: 05/11/2016 10:02    Cardiac Studies   Echo still pending  Patient Profile     71 y.o. female seen at the request of Dr. Lindaann Pascal for evaluation of dyspnea. She has a remote history of  atrial flutter. 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 She had a normal stress Echo in the past. Echo in 2013 showed no shunt and normal LV/RV function. 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 She is s/p banding. Due to her varices she was switched from metoprolol to propranolol. She has recent marked increase in ascites.  Assessment & Plan    Acute on chronic respiratory failure with hypoxia. -Hx of remote VSD repair -CXR and BNP not indicative of CHF. -Is followed by GI for decompensated cryptogenic cirrhosis with worsening ascities. -Suspect symptoms related to severe ascites. GI had increased her lasix  and added spironolactone.  Ultrasound guided paracentesis done this admission yielding 5.5 liters. Breathing is much improved. -GI recommends spironolactone 100 mg and lasix 40 mg daily.  PAT -exacerbated by respiratory distress and volume shift post paracentesis.  -still having shorts bursts of PAT but no sustained arrhythmia. This is about her baseline. Unable to increase propranolol due to low BP. Will continue 20 mg bid. May take extra dose if she has sustained tachycardia. Control of her ascites will be key.  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 -GI recommends spironolactone 100 mg and lasix 40 mg daily.  Patient stable for DC from my standpoint.   Signed,  Peter Martinique, [place] 05/12/2016 10:56 AM

## 2016-05-12 NOTE — Discharge Summary (Signed)
Physician Discharge Summary  Natalie Lane:[id number] DOB: 1945-04-14 DOA: 05/10/2016  PCP: Reginia Naas, MD  Admit date: 05/10/2016 Discharge date: 05/12/2016  Admitted From: Home Disposition: Home  Recommendations for Outpatient Follow-up:  1. Follow up with PCP in 1-2 weeks 2. Please obtain BMP/CBC in one week 3. Lasix increased to 40 mg daily, Added Aldactone 100 mg, told to have 1.5 L fluids restriction per day  Home Health: NA Equipment/Devices:NA  Discharge Condition: Stable CODE STATUS: Full Code Diet recommendation: Diet heart healthy/carb modified Room service appropriate? 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 She is followed by Dr. Michail Sermon with gastroenterology. She presented with the complaints of worsening abdominal distention, dyspnea, and a transient episode of chest pain. Patient was hospitalized for further management. Gastroenterology and cardiology were consulted. Patient underwent paracentesis. With removal of 5.4 L of ascitic fluid  Discharge Diagnoses:  Principal Problem:   Ascites Active Problems:   Hyperlipidemia   Poorly controlled type 2 diabetes mellitus with peripheral neuropathy (HCC)   Chest pain at rest   Decompensated hepatic cirrhosis (HCC)   PAT (paroxysmal atrial tachycardia) (HCC)   Decompensated Cryptogenic cirrhosis (with known gastric varices)complicated by symptomatic recurrent ascites -Came in with dyspnea and tense ascites, underwent her centesis with 5.5 L removed -Ascitic fluid culture was negative and cytology showed reactive mesothelial cells.  -This is reaccumulated rapidly, she just had paracentesis done on 3/12. -I increased her  Lasix to 40 mg per day, asked her to have 1.5 L of fluid restriction per day. -Follow-up with gastroenterology ASAP. Added Aldactone, discussed briefly with Dr. Michail Sermon, agrees to Aldactone.  Atrial flutter with RVR Noted to have tachycardia this morning on examination. Telemetry shows heart rate in the 130s. EKG is been read as sinus tach with probably is aflutter. Cardiology notified. Apparently she converted to sinus rhythm after 4mins. Continue with her home dose of beta blocker. Not thought to be an anticoagulation candidate, presumably due to presence of gastric varices.  Dyspnea/acute respiratory failure with hypoxia It was felt that her respiratory symptoms were due to ascites. Echocardiogram showed normal LVEF. Doppler prelim results, negative for DVT  Atypical chest pain This was a transient episode about 2 days ago. EKG did not show any ischemic changes. Troponins have been negative. Cardiology is following. D-dimer was checked and it significantly elevated. There is mention of allergy to contrast.  CT scan showed no evidence of PE, Doppler pending, but patient already has contraindication for anticoagulation.  Type II DM with peripheral neuropathy Hold oral medications. SSI.  Cryptogenic liver cirrhosis -Has ascites, hypoalbuminemia, elevated AST, patient has esophageal varices, yet she is on different NSAIDs. -She is on a scheduled meloxicam and as needed ibuprofen, meloxicam discontinued.   Discharge Instructions  Discharge Instructions    Diet - low sodium heart healthy    Complete by:  As directed    Increase activity slowly    Complete by:  As directed      Allergies as of 05/12/2016      Reactions   Monosodium Glutamate Anaphylaxis   MSG   Red Dye Anaphylaxis   Erythromycin Other (See Comments)   Reaction:  Migraine    Penicillins Other (See Comments)   Reaction:  Migraine  Has patient  had a PCN reaction causing immediate rash, facial/tongue/throat  swelling, SOB or lightheadedness with hypotension: No Has patient had a PCN reaction causing severe rash involving mucus membranes or skin necrosis: No Has patient had a PCN reaction that required hospitalization No Has patient had a PCN reaction occurring within the last 10 years: No If all of the above answers are "NO", then may proceed with Cephalosporin use.   Statins Other (See Comments)   Reaction:  Severe leg cramps    Sulfa Antibiotics Hives   Glipizide Rash   Iohexol Rash   Spironolactone Palpitations, Other (See Comments)   Sweating      Medication List    STOP taking these medications   meloxicam 15 MG tablet Commonly known as:  MOBIC     TAKE these medications   diphenhydrAMINE 25 MG tablet Commonly known as:  BENADRYL Take 25 mg by mouth every 6 (six) hours as needed for allergies.   docusate sodium 100 MG capsule Commonly known as:  COLACE Take 100 mg by mouth 2 (two) times daily as needed for mild constipation.   furosemide 40 MG tablet Commonly known as:  LASIX Take 1 tablet (40 mg total) by mouth daily. What changed:  medication strength  how much to take   glimepiride 2 MG tablet Commonly known as:  AMARYL Take 1 tablet (2 mg total) by mouth 2 (two) times daily before a meal. What changed:  when to take this   ibuprofen 200 MG tablet Commonly known as:  ADVIL,MOTRIN Take 200 mg by mouth every 6 (six) hours as needed for headache, mild pain or moderate pain.   insulin NPH Human 100 UNIT/ML injection Commonly known as:  HUMULIN N,NOVOLIN N ReliOn brand - Inject 25 units in am and 20 units at night under skin What changed:  how much to take  how to take this  when to take this  additional instructions   lactulose 10 GM/15ML solution Commonly known as:  CHRONULAC Take by mouth 2 (two) times daily. Notes to patient:  You did not receive this medication during your hospital stay. You may resume it after discharge.    loperamide 2 MG  tablet Commonly known as:  IMODIUM A-D Take 2 mg by mouth 4 (four) times daily as needed for diarrhea or loose stools.   metFORMIN 500 MG 24 hr tablet Commonly known as:  GLUCOPHAGE-XR Take 1 tablet (500 mg total) by mouth 2 (two) times daily with a meal. Notes to patient:  You did not receive this medication during your hospital stay. You may resume after discharge.   propranolol 20 MG tablet Commonly known as:  INDERAL Take 1 tablet (20 mg total) by mouth 2 (two) times daily.   ranitidine 150 MG capsule Commonly known as:  ZANTAC Take 150 mg by mouth daily as needed for heartburn.   rifaximin 550 MG Tabs tablet Commonly known as:  XIFAXAN Take 550 mg by mouth 2 (two) times daily.   simethicone 80 MG chewable tablet Commonly known as:  MYLICON Chew 80 mg by mouth 4 (four) times daily as needed for flatulence.   spironolactone 100 MG tablet Commonly known as:  ALDACTONE Take 1 tablet (100 mg total) by mouth daily.       Allergies  Allergen Reactions  . Monosodium Glutamate Anaphylaxis    MSG  . Red Dye Anaphylaxis  . Erythromycin Other (See Comments)    Reaction:  Migraine   . Penicillins Other (See Comments)  Reaction:  Migraine  Has patient had a PCN reaction causing immediate rash, facial/tongue/throat swelling, SOB or lightheadedness with hypotension: No Has patient had a PCN reaction causing severe rash involving mucus membranes or skin necrosis: No Has patient had a PCN reaction that required hospitalization No Has patient had a PCN reaction occurring within the last 10 years: No If all of the above answers are "NO", then may proceed with Cephalosporin use.  . Statins Other (See Comments)    Reaction:  Severe leg cramps   . Sulfa Antibiotics Hives  . Glipizide Rash  . Iohexol Rash  . Spironolactone Palpitations and Other (See Comments)    Sweating    Consultations: Treatment Team:  [facility], MD Wilford Corner, MD   Procedures (Echo,  Carotid, EGD, Colonoscopy, ERCP)   Radiological studies: Dg Chest 2 View  Result Date: 05/10/2016 CLINICAL DATA:  Difficulty breathing for 3 days. EXAM: CHEST  2 VIEW COMPARISON:  04/26/2016 FINDINGS: Low volume film. Interval progression of subsegmental atelectasis of both lung bases. No pulmonary edema or pleural effusion. The cardiopericardial silhouette is within normal limits for size. Patient is status post median sternotomy. The visualized bony structures of the thorax are intact. IMPRESSION: Low volume film with slight progression of bibasilar atelectasis. Electronically Signed   By: Misty Stanley M.D.   On: 05/10/2016 14:17   Dg Chest 2 View  Result Date: 04/26/2016 CLINICAL DATA:  Shortness of breath.  Ascites.  Cirrhosis. EXAM: CHEST  2 VIEW COMPARISON:  Chest x-rays dated 11/16/2013 and 08/31/2011 FINDINGS: Heart size and pulmonary vascularity are normal. The patient has moderate linear atelectasis at both lung bases. No consolidative infiltrates or effusions. Bilateral peribronchial thickening. No acute bone abnormality. IMPRESSION: Bibasilar atelectasis and peribronchial thickening. The findings are consistent with bronchitis. Electronically Signed   By: Lorriane Shire M.D.   On: 04/26/2016 12:57   Nm Pulmonary Perf And Vent  Result Date: 05/11/2016 CLINICAL DATA:  Dyspnea EXAM: NUCLEAR MEDICINE VENTILATION - PERFUSION LUNG SCAN TECHNIQUE: Ventilation images were obtained in multiple projections using inhaled aerosol Tc-57m DTPA. Perfusion images were obtained in multiple projections after intravenous injection of Tc-27m MAA. RADIOPHARMACEUTICALS:  32 mCi Technetium-1m DTPA aerosol inhalation and 4.2 mCi Technetium-55m MAA IV COMPARISON:  None. FINDINGS: Ventilation: No focal ventilation defect. Mild central deposition of radiotracer as can be seen with low airways disease. Perfusion: No wedge shaped peripheral perfusion defects to suggest acute pulmonary embolism. IMPRESSION: No evidence  of pulmonary embolus. Electronically Signed   By: Kathreen Devoid   On: 05/11/2016 15:55   US Paracentesis  Result Date: 04/26/2016 INDICATION: Patient with history of recurrent abdominal ascites. Request is made for diagnostic and therapeutic paracentesis. EXAM: ULTRASOUND GUIDED DIAGNOSTIC AND THERAPEUTIC PARACENTESIS MEDICATIONS: 10 mL 1% lidocaine COMPLICATIONS: None immediate. PROCEDURE: Informed written consent was obtained from the patient after a discussion of the risks, benefits and alternatives to treatment. A timeout was performed prior to the initiation of the procedure. Initial ultrasound scanning demonstrates a moderate amount of ascites within the left lateral abdomen. The left lateral abdomen was prepped and draped in the usual sterile fashion. 1% lidocaine with epinephrine was used for local anesthesia. Following this, a 19 gauge, 7-cm, Yueh catheter was introduced. An ultrasound image was saved for documentation purposes. The paracentesis was performed. The catheter was removed and a dressing was applied. The patient tolerated the procedure well without immediate post procedural complication. FINDINGS: A total of approximately 4.3 liters of chylous fluid was removed. Samples were sent  to the laboratory as requested by the clinical team. IMPRESSION: Successful ultrasound-guided diagnostic and therapeutic left paracentesis yielding 4.3 liters of peritoneal fluid. Read by:  Gareth Eagle, PA-C Electronically Signed   By: Jerilynn Mages.  Shick M.D.   On: 04/26/2016 16:06   Ir Paracentesis  Result Date: 05/11/2016 INDICATION: Abdominal distention secondary to recurrent ascites. Request diagnostic and therapeutic paracentesis. EXAM: ULTRASOUND GUIDED RIGHT LOWER QUADRANT PARACENTESIS MEDICATIONS: None. COMPLICATIONS: None immediate. PROCEDURE: Informed written consent was obtained from the patient after a discussion of the risks, benefits and alternatives to treatment. A timeout was performed prior to the  initiation of the procedure. Initial ultrasound scanning demonstrates a large amount of ascites within the right lower abdominal quadrant. The right lower abdomen was prepped and draped in the usual sterile fashion. 1% lidocaine was used for local anesthesia. Following this, a 19 gauge, 7-cm, Yueh catheter was introduced. An ultrasound image was saved for documentation purposes. The paracentesis was performed. The catheter was removed and a dressing was applied. The patient tolerated the procedure well without immediate post procedural complication. FINDINGS: A total of approximately 5.5 L of cloudy serosanguineous fluid was removed. Samples were sent to the laboratory as requested by the clinical team. IMPRESSION: Successful ultrasound-guided paracentesis yielding 5.5 liters of peritoneal fluid. Read by: Ascencion Dike PA-C Electronically Signed   By: Markus Daft M.D.   On: 05/11/2016 10:02    Subjective:  Discharge Exam: Vitals:   05/11/16 2025 05/12/16 0545 05/12/16 1121 05/12/16 1400  BP: (!) 94/51 (!) 101/50 114/70 (!) 94/55  Pulse: 76 77 79 77  Resp: 20 18  18   Temp: 98.1 F (36.7 C) 98.2 F (36.8 C)  97.9 F (36.6 C)  TempSrc: Oral Oral  Oral  SpO2: 96% 94%  95%  Weight:  92.1 kg (203 lb)    Height:       General: Pt is alert, awake, not in acute distress Cardiovascular: RRR, S1/S2 +, no rubs, no gallops Respiratory: CTA bilaterally, no wheezing, no rhonchi Abdominal: Soft, NT, ND, bowel sounds + Extremities: no edema, no cyanosis   The results of significant diagnostics from this hospitalization (including imaging, microbiology, ancillary and laboratory) are listed below for reference.    Microbiology: Recent Results (from the past 240 hour(s))  Body fluid culture     Status: None (Preliminary result)   Collection Time: 05/11/16  9:45 AM  Result Value Ref Range Status   Specimen Description FLUID PERITONEAL  Final   Special Requests NONE  Final   Gram Stain   Final    FEW  WBC PRESENT, PREDOMINANTLY MONONUCLEAR NO ORGANISMS SEEN    Culture NO GROWTH 1 DAY  Final   Report Status PENDING  Incomplete     Labs: BNP (last 3 results)  Recent Labs  04/26/16 1308 05/10/16 1431  BNP 96.5 220.2*   Basic Metabolic Panel:  Recent Labs Lab 05/10/16 1431 05/11/16 1234 05/12/16 0253  NA 140 138 136  K 4.8 4.2 4.3  CL 104 103 104  CO2 25 23 25   GLUCOSE 126* 209* 179*  BUN 12 10 12   CREATININE 0.90 0.81 0.77  CALCIUM 9.0 9.0 8.5*   Liver Function Tests:  Recent Labs Lab 05/10/16 1431  AST 51*  ALT 32  ALKPHOS 115  BILITOT 2.0*  PROT 6.0*  ALBUMIN 3.1*   No results for input(s): LIPASE, AMYLASE in the last 168 hours. No results for input(s): AMMONIA in the last 168 hours. CBC:  Recent Labs Lab 05/10/16  1431 05/12/16 0253  WBC 8.1 6.7  NEUTROABS 5.2  --   HGB 15.3* 12.9  HCT 44.8 39.1  MCV 99.3 99.2  PLT 204 167   Cardiac Enzymes:  Recent Labs Lab 05/10/16 1431 05/10/16 1953 05/11/16 0321 05/11/16 1234  TROPONINI <0.03 <0.03 <0.03 <0.03   BNP: Invalid input(s): POCBNP CBG:  Recent Labs Lab 05/11/16 1055 05/11/16 1607 05/11/16 2129 05/12/16 0610 05/12/16 1115  GLUCAP 125* 129* 184* 171* 237*   D-Dimer  Recent Labs  05/10/16 1953  DDIMER >20.00*   Hgb A1c No results for input(s): HGBA1C in the last 72 hours. Lipid Profile No results for input(s): CHOL, HDL, LDLCALC, TRIG, CHOLHDL, LDLDIRECT in the last 72 hours. Thyroid function studies No results for input(s): TSH, T4TOTAL, T3FREE, THYROIDAB in the last 72 hours.  Invalid input(s): FREET3 Anemia work up No results for input(s): VITAMINB12, FOLATE, FERRITIN, TIBC, IRON, RETICCTPCT in the last 72 hours. Urinalysis    Component Value Date/Time   COLORURINE YELLOW 04/26/2016 1217   APPEARANCEUR CLEAR 04/26/2016 1217   LABSPEC 1.006 04/26/2016 1217   PHURINE 6.0 04/26/2016 1217   GLUCOSEU NEGATIVE 04/26/2016 1217   HGBUR SMALL (A) 04/26/2016 1217    BILIRUBINUR NEGATIVE 04/26/2016 1217   KETONESUR NEGATIVE 04/26/2016 1217   PROTEINUR NEGATIVE 04/26/2016 1217   NITRITE NEGATIVE 04/26/2016 1217   LEUKOCYTESUR NEGATIVE 04/26/2016 1217   Sepsis Labs Invalid input(s): PROCALCITONIN,  WBC,  LACTICIDVEN Microbiology Recent Results (from the past 240 hour(s))  Body fluid culture     Status: None (Preliminary result)   Collection Time: 05/11/16  9:45 AM  Result Value Ref Range Status   Specimen Description FLUID PERITONEAL  Final   Special Requests NONE  Final   Gram Stain   Final    FEW WBC PRESENT, PREDOMINANTLY MONONUCLEAR NO ORGANISMS SEEN    Culture NO GROWTH 1 DAY  Final   Report Status PENDING  Incomplete     Time coordinating discharge: Over 30 minutes  SIGNED:   Birdie Hopes, MD  [facility] 05/12/2016, 2:19 PM Pager   If 7PM-7AM, please contact night-coverage [email or web address] Password [email or web address]

## 2016-05-12 NOTE — Progress Notes (Signed)
[facility] Progress Note  PEARSON REASONS 71 y.o. 03-25-1945   Subjective: Feels better. Denies abdominal pain. In good spirits.  Objective: Vital signs in last 24 hours: Vitals:   05/12/16 0545 05/12/16 1121  BP: (!) 101/50 114/70  Pulse: 77 79  Resp: 18   Temp: 98.2 F (36.8 C)     Physical Exam: Gen: alert, no acute distress HEENT: anicteric sclera CV:RRR Chest: CTA B Abd: less distended, nontender, +BS, softer Ext: no edema  Lab Results:  Recent Labs  05/11/16 1234 05/12/16 0253  NA 138 136  K 4.2 4.3  CL 103 104  CO2 23 25  GLUCOSE 209* 179*  BUN 10 12  CREATININE 0.81 0.77  CALCIUM 9.0 8.5*    Recent Labs  05/10/16 1431  AST 51*  ALT 32  ALKPHOS 115  BILITOT 2.0*  PROT 6.0*  ALBUMIN 3.1*    Recent Labs  05/10/16 1431 05/12/16 0253  WBC 8.1 6.7  NEUTROABS 5.2  --   HGB 15.3* 12.9  HCT 44.8 39.1  MCV 99.3 99.2  PLT 204 167    Recent Labs  05/10/16 1431  LABPROT 16.4*  INR 1.32      Assessment/Plan: Decompensated cirrhosis with ascites - 5.5 L ascites removed yesterday. Negative for SBP. Discharge on Aldactone 100 mg/day and Lasix 40 mg/day. Reports "sweating" on Aldactone not swelling. Close f/u of serum Cr on diuretics (needs BMET next week). F/U in my office in 3-4 weeks. Ok to go home from a GI standpoint. Nurse reports she is awaiting Echo. Will sign off. Call if questions.   Bridger C. 05/12/2016, 11:33 AM   Pager [phone]  AFTER 5 pm or on weekends call [phone] ID: Natalie Lane, female   DOB: May 02, 1945, 71 y.o.   MRN: [id number]

## 2016-05-12 NOTE — Progress Notes (Signed)
*  PRELIMINARY RESULTS* Vascular Ultrasound Bilateral lower extreity venous duplex has been completed.  Preliminary findings: No evidence of deep vein thrombosis or baker's cysts in the bilateral lower extremities.    Natalie Lane 05/12/2016, 10:51 AM

## 2016-05-12 NOTE — Evaluation (Signed)
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 She is followed by Dr. Michail Sermon with gastroenterology. She presented with the complaints of worsening abdominal distention, dyspnea, and a transient episode of chest pain. Pt with ascites, decompensated hepatic cirrhosis and atrial flutter.   Clinical Impression  Pt functioning near baseline, however, at moderate risk of fall as indicated by DGI score of 19/24. Pt would benefit from acute PT to address dynamic balance. Recommend d/c home when medically ready. Acute PT will follow.     Follow Up Recommendations No PT follow up;Supervision for mobility/OOB    Equipment Recommendations  None recommended by PT    Recommendations for Other Services       Precautions / Restrictions Precautions Precautions: Fall Restrictions Weight Bearing Restrictions: No      Mobility  Bed Mobility Overal bed mobility: Modified Independent             General bed mobility comments: increased time  Transfers Overall transfer level: Needs assistance Equipment used: None Transfers: Sit to/from Stand Sit to Stand: Supervision         General transfer comment: supervison for safety  Ambulation/Gait Ambulation/Gait assistance: Min guard Ambulation Distance (Feet): 200 Feet Assistive device: None Gait Pattern/deviations: Step-through pattern;Decreased stride length Gait velocity: decreased Gait velocity interpretation: Below normal speed for age/gender General Gait Details: min guard for safety, slower speed  Stairs Stairs: Yes Stairs assistance: Supervision Stair Management: One rail Right;Step to pattern;Forwards Number of  Stairs: 2 General stair comments: supervision for safety. pt instructed in proper technique to reduce pain in L hip  Wheelchair Mobility    Modified Rankin (Stroke Patients Only)       Balance                                 Standardized Balance Assessment Standardized Balance Assessment : Dynamic Gait Index   Dynamic Gait Index Level Surface: Mild Impairment Change in Gait Speed: Normal Gait with Horizontal Head Turns: Normal Gait with Vertical Head Turns: Normal Gait and Pivot Turn: Mild Impairment Step Over Obstacle: Normal Step Around Obstacles: Mild Impairment Steps: Moderate Impairment Total Score: 19       Pertinent Vitals/Pain Pain Assessment: No/denies pain    Home Living Family/patient expects to be discharged to:: Private residence Living Arrangements: Alone Available Help at Discharge: Friend(s);Neighbor;Available PRN/intermittently Type of Home:  (condo) Home Access: Stairs to enter Entrance Stairs-Rails: Psychiatric nurse of Steps: 2 Home Layout: One level Home Equipment: Cane - single point;Grab bars - tub/shower      Prior Function Level of Independence: Independent with assistive device(s)         Comments: has SPC, however, only uses it when L hip painful-rare, for longer distances     Hand Dominance   Dominant Hand: Right    Extremity/Trunk Assessment   Upper Extremity Assessment Upper Extremity Assessment: Overall WFL for tasks assessed    Lower Extremity Assessment Lower Extremity Assessment: Generalized weakness    Cervical / Trunk Assessment Cervical / Trunk Assessment: Kyphotic  Communication   Communication: No difficulties  Cognition Arousal/Alertness: Awake/alert Behavior During Therapy: WFL for tasks assessed/performed Overall Cognitive Status: Within Functional Limits  for tasks assessed                                        General Comments      Exercises      Assessment/Plan    PT Assessment Patient needs continued PT services  PT Problem List Decreased strength;Decreased balance       PT Treatment Interventions Gait training;Balance training;Neuromuscular re-education;Therapeutic exercise    PT Goals (Current goals can be found in the Care Plan section)  Acute Rehab PT Goals Patient Stated Goal: go home PT Goal Formulation: With patient Time For Goal Achievement: 05/26/16 Potential to Achieve Goals: Good Additional Goals Additional Goal #1: Pt will score >19/24 on DGI to demonstrate minimal fall risk for safe transition home.     Frequency Min 3X/week   Barriers to discharge        Co-evaluation               End of Session Equipment Utilized During Treatment: Gait belt Activity Tolerance: Patient tolerated treatment well Patient left: in chair;with call bell/phone within reach Nurse Communication: Mobility status PT Visit Diagnosis: Difficulty in walking, not elsewhere classified (R26.2)    Time: 2800-3491 PT Time Calculation (min) (ACUTE ONLY): 18 min   Charges:   PT Evaluation $PT Eval Low Complexity: 1 Procedure     PT G CodesTracie Harrier, SPT Acute Rehab SPT [phone]    Tracie Harrier 05/12/2016, 9:20 AM

## 2016-05-12 NOTE — Progress Notes (Signed)
  Echocardiogram 2D Echocardiogram has been performed with definity.  Aggie Cosier 05/12/2016, 1:13 PM

## 2016-05-12 NOTE — Care Management Note (Signed)
Case Management Note Marvetta Gibbons RN, BSN Unit 2W-Case Manager [phone]  Patient Details  Name: Natalie Lane MRN: [id number] Date of Birth: 11-04-1945  Subjective/Objective:    Pt admitted with ascites s/p paracentesis                 Action/Plan: PTA pt lived at home- plan to return home- No CM needs noted for discharge.   Expected Discharge Date:  05/12/16               Expected Discharge Plan:  Home/Self Care  In-House Referral:     Discharge planning Services  CM Consult  Post Acute Care Choice:  NA Choice offered to:  NA  DME Arranged:    DME Agency:     HH Arranged:    HH Agency:     Status of Service:  Completed, signed off  If discussed at [employer] of Stay Meetings, dates discussed:    Additional Comments:  Dawayne Patricia, RN 05/12/2016, 11:09 AM

## 2016-05-13 LAB — AFP TUMOR MARKER: AFP TUMOR MARKER: 1.6 ng/mL (ref 0.0–8.3)

## 2016-05-14 LAB — BODY FLUID CULTURE: Culture: NO GROWTH

## 2016-05-17 ENCOUNTER — Ambulatory Visit: Payer: Self-pay | Admitting: Internal Medicine

## 2016-05-21 DIAGNOSIS — K7460 Unspecified cirrhosis of liver: Secondary | ICD-10-CM | POA: Diagnosis not present

## 2016-05-24 DIAGNOSIS — I8500 Esophageal varices without bleeding: Secondary | ICD-10-CM | POA: Diagnosis not present

## 2016-05-24 DIAGNOSIS — K7460 Unspecified cirrhosis of liver: Secondary | ICD-10-CM | POA: Diagnosis not present

## 2016-05-24 DIAGNOSIS — K219 Gastro-esophageal reflux disease without esophagitis: Secondary | ICD-10-CM | POA: Diagnosis not present

## 2016-05-24 DIAGNOSIS — E778 Other disorders of glycoprotein metabolism: Secondary | ICD-10-CM | POA: Diagnosis not present

## 2016-05-24 DIAGNOSIS — Z7984 Long term (current) use of oral hypoglycemic drugs: Secondary | ICD-10-CM | POA: Diagnosis not present

## 2016-05-24 DIAGNOSIS — R188 Other ascites: Secondary | ICD-10-CM | POA: Diagnosis not present

## 2016-05-24 DIAGNOSIS — E1165 Type 2 diabetes mellitus with hyperglycemia: Secondary | ICD-10-CM | POA: Diagnosis not present

## 2016-05-24 LAB — MICROALBUMIN, URINE

## 2016-05-27 DIAGNOSIS — K7460 Unspecified cirrhosis of liver: Secondary | ICD-10-CM | POA: Diagnosis not present

## 2016-05-27 DIAGNOSIS — R188 Other ascites: Secondary | ICD-10-CM | POA: Diagnosis not present

## 2016-05-31 ENCOUNTER — Encounter: Payer: Self-pay | Admitting: Internal Medicine

## 2016-05-31 ENCOUNTER — Ambulatory Visit (INDEPENDENT_AMBULATORY_CARE_PROVIDER_SITE_OTHER): Payer: Medicare Other | Admitting: Internal Medicine

## 2016-05-31 VITALS — BP 108/68 | HR 89 | Temp 97.7°F | Wt 198.4 lb

## 2016-05-31 DIAGNOSIS — E1142 Type 2 diabetes mellitus with diabetic polyneuropathy: Secondary | ICD-10-CM | POA: Diagnosis not present

## 2016-05-31 DIAGNOSIS — E1165 Type 2 diabetes mellitus with hyperglycemia: Secondary | ICD-10-CM

## 2016-05-31 LAB — POCT GLYCOSYLATED HEMOGLOBIN (HGB A1C): Hemoglobin A1C: 6.7

## 2016-05-31 MED ORDER — INSULIN NPH (HUMAN) (ISOPHANE) 100 UNIT/ML ~~LOC~~ SUSP: SUBCUTANEOUS | Status: DC

## 2016-05-31 NOTE — Progress Notes (Signed)
Patient ID: Natalie Lane, female   DOB: Jan 15, 1946, 71 y.o.   MRN: [id number]  HPI: Natalie Lane is a 71 y.o.-year-old female, returning for f/u for DM2, dx in 2004, insulin-dependent since 2015, uncontrolled, with complications (PN). Last visit 4.5 mo ago. She has [facility] part A and B. She has [facility] for drugs.  Since last visit, she was admitted for abnormal EKG in 04/2016. She also was in the ED for ascites (cirrhosis) >> drained 3x this March. She is seeing Dr. Michail Sermon. Her Lasix and Spironolactone doses were increased.  Last hemoglobin A1c was: Lab Results  Component Value Date   HGBA1C 7.2 01/28/2016   HGBA1C 7.6 (H) 11/04/2015   HGBA1C 7.3 08/08/2015  11/27/2013: 8.4%  Pt was on a regimen of: - Lantus 40 units qhs - Metformin XR 500 mg 2x a day - added 03/18/2014 - Jardiance 25 mg daily in am - added 03/18/2014  - Welchol 1 package a day  Now on: - Metformin XR 500 mg 2x a day with meals. - Amaryl 2 mg 2x a day before meals - Humulin NPH 25 units in am and 20 units at bedtime  Stopped Welchol b/c price. She tried Invokana, not covered. She was on Jardiance 25 mg before breakfast >> had to stop b/c expensive! In the donut hole. We tried Glipizide XL >> rash, palpitations >> she had to stop it after a week.  She was on Metformin - taken off as she had cirrhosis. She had diarrhea with regular metformin. She was on Tradjenta 5 mg in am She was on Welchol 3x a week >> not taking it frequently She tried [place] >> too expensive.  Pt checks her sugars 1-2x a day and they are (per log): - am: 158-219, 233 (after 2x coffee + creamer!!!) >> 99-134, 166, 188 >> 43-150 - 2h after b'fast:  201, 207 >> n/c >> 140 >> 175-240 >> n/c >> 171 >> n/c - before lunch:  127-184 >> 135, 161-169, 217 >> 164-170, 223 >> 181 >> 100, 151 - 2h after lunch: n/c >> 257 >> n/c >> 165 >> n/c >> 203 >> n/c - before dinner: 147 >> 188 >> n/c >> 135 >> 168-181 >> n/c - 2h after dinner:  152-339, 477 >> 207 >> n/c >> 174 >> 180 >> n/c - bedtime: n/c >> see above >> n/c >> 109-203 >> 108-178 >> n/c - nighttime: n/c No lows. Lowest sugar was 98 >> 88 >> 118 >> 158 >> 43; she has hypoglycemia awareness at 71 y.  Highest sugar was 233 >> 151  Glucometer: One Touch Verio Flex  Pt's meals are: - Breakfast: oatmeal or [place] bacon or sausage + eggs - Lunch: chilli beans; vegetable or hamburger soup - Dinner: salad or bowl of cereals She saw Antonieta Iba (nutrition).  - no CKD, last BUN/creatinine:  08/26/2015: 15/0.72 Lab Results  Component Value Date   BUN 12 05/12/2016   CREATININE 0.77 05/12/2016  ACR  - last set of lipids: 12/18/2014: 309/175/56/218 Lab Results  Component Value Date   CHOL 287 (A) 02/28/2014   HDL 62 02/28/2014   LDLCALC 196 02/28/2014   TRIG 148 02/28/2014  Off Welchol b/c cramps. Statins >> leg and foot cramps.  - last eye exam was in 09/2014. No DR.  - + numbness and tingling in her feet.  ROS: Constitutional: + weight gain and loss, + fatigue, no subjective hyperthermia; + nocturia Eyes: no blurry vision, no xerophthalmia ENT:  no sore throat, no nodules palpated in throat, no dysphagia/no odynophagia, no hoarseness Cardiovascular: no CP/+ SOB/+ palpitations/ leg swelling Respiratory: no cough/+ SOB with exertion/no wheezing Gastrointestinal: no N/V/D/C/heartburn Musculoskeletal: no muscle aches/ joint aches Skin: no rash Neurological: no tremors/numbness/tingling/dizziness,+ HA  I reviewed pt's medications, allergies, PMH, social hx, family hx, and changes were documented in the history of present illness. Otherwise, unchanged from my initial visit note, exc. Stopped Lasix and started Meloxicam.  Past Medical History:  Diagnosis Date  . Anemia   . Atrial flutter ([place])   . 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   . CHF (congestive heart failure) ([place])   . Cirrhosis ([place])   .  Complication of anesthesia   . Diabetes mellitus, type 2 ([place])   . Dyspnea   . Dysrhythmia    AFlutter  . Edema   . GERD (gastroesophageal reflux disease)   . History of blood transfusion   . Hyperlipidemia   . Normal stress echocardiogram 2008  . PONV (postoperative nausea and vomiting)    with Cholecystectomy   Past Surgical History:  Procedure Laterality Date  . ABDOMINAL HYSTERECTOMY    . ASD versus VSD repair  1978   Operative notes not available.  . ATRIAL ABLATION SURGERY  10 /2011  . [place]   x2  . CHOLECYSTECTOMY    . CORONARY ARTERY BYPASS GRAFT  1978  . ESOPHAGOGASTRODUODENOSCOPY (EGD) WITH PROPOFOL N/A 11/16/2013   Procedure: ESOPHAGOGASTRODUODENOSCOPY (EGD) WITH PROPOFOL;  Surgeon: Lear Ng, MD;  Location: [place] [facility];  Service: Endoscopy;  Laterality: N/A;  . ESOPHAGOGASTRODUODENOSCOPY (EGD) WITH PROPOFOL N/A 01/23/2014   Procedure: ESOPHAGOGASTRODUODENOSCOPY (EGD) WITH PROPOFOL;  Surgeon: Lear Ng, MD;  Location: [facility];  Service: Endoscopy;  Laterality: N/A;  . GASTRIC VARICES BANDING N/A 11/16/2013   Procedure: GASTRIC VARICES BANDING;  Surgeon: Lear Ng, MD;  Location: [place];  Service: Endoscopy;  Laterality: N/A;  . GASTRIC VARICES BANDING N/A 01/23/2014   Procedure: GASTRIC VARICES BANDING;  Surgeon: Lear Ng, MD;  Location: [place];  Service: Endoscopy;  Laterality: N/A;  . HERNIA REPAIR    . IR GENERIC HISTORICAL  05/11/2016   IR PARACENTESIS 05/11/2016 Ascencion Dike, PA-C [facility]  . OTHER SURGICAL HISTORY     partial hysterectomy   History   Social History  . Marital Status: Married    Spouse Name: N/A    Number of Children: 3   Occupational History  . retired    Social History Main Topics  . Smoking status: Current Every Day Smoker -- 0.50 packs/day for 36 years    Types: Cigarettes  . Smokeless tobacco: Never Used  . Alcohol Use: No  . Drug Use: No   Current  Outpatient Prescriptions on File Prior to Visit  Medication Sig Dispense Refill  . diphenhydrAMINE (BENADRYL) 25 MG tablet Take 25 mg by mouth every 6 (six) hours as needed for allergies.    Marland Kitchen docusate sodium (COLACE) 100 MG capsule Take 100 mg by mouth 2 (two) times daily as needed for mild constipation.    . furosemide (LASIX) 40 MG tablet Take 1 tablet (40 mg total) by mouth daily. 30 tablet 0  . glimepiride (AMARYL) 2 MG tablet Take 1 tablet (2 mg total) by mouth 2 (two) times daily before a meal. (Patient taking differently: Take 2 mg by mouth 2 (two) times daily. ) 180 tablet 3  .  ibuprofen (ADVIL,MOTRIN) 200 MG tablet Take 200 mg by mouth every 6 (six) hours as needed for headache, mild pain or moderate pain.     Marland Kitchen insulin NPH Human (HUMULIN N,NOVOLIN N) 100 UNIT/ML injection ReliOn brand - Inject 25 units in am and 20 units at night under skin (Patient taking differently: Inject 20-25 Units into the skin 2 (two) times daily. ReliOn brand - Pt uses 25 units in the morning and 20 units at bedtime.) 20 mL 11  . lactulose (CHRONULAC) 10 GM/15ML solution Take by mouth 2 (two) times daily.    Marland Kitchen loperamide (IMODIUM A-D) 2 MG tablet Take 2 mg by mouth 4 (four) times daily as needed for diarrhea or loose stools.    . metFORMIN (GLUCOPHAGE-XR) 500 MG 24 hr tablet Take 1 tablet (500 mg total) by mouth 2 (two) times daily with a meal. 180 tablet 3  . propranolol (INDERAL) 20 MG tablet Take 1 tablet (20 mg total) by mouth 2 (two) times daily. 60 tablet 11  . ranitidine (ZANTAC) 150 MG capsule Take 150 mg by mouth daily as needed for heartburn.    . rifaximin (XIFAXAN) 550 MG TABS tablet Take 550 mg by mouth 2 (two) times daily.    . simethicone (MYLICON) 80 MG chewable tablet Chew 80 mg by mouth 4 (four) times daily as needed for flatulence.    Marland Kitchen spironolactone (ALDACTONE) 100 MG tablet Take 1 tablet (100 mg total) by mouth daily. 30 tablet 0   No current facility-administered medications on file prior  to visit.    Allergies  Allergen Reactions  . Monosodium Glutamate Anaphylaxis    MSG  . Red Dye Anaphylaxis  . Erythromycin Other (See Comments)    Reaction:  Migraine   . Penicillins Other (See Comments)    Reaction:  Migraine  Has patient had a PCN reaction causing immediate rash, facial/tongue/throat swelling, SOB or lightheadedness with hypotension: No Has patient had a PCN reaction causing severe rash involving mucus membranes or skin necrosis: No Has patient had a PCN reaction that required hospitalization No Has patient had a PCN reaction occurring within the last 10 years: No If all of the above answers are "NO", then may proceed with Cephalosporin use.  . Statins Other (See Comments)    Reaction:  Severe leg cramps   . Sulfa Antibiotics Hives  . Glipizide Rash  . Iohexol Rash  . Spironolactone Palpitations and Other (See Comments)    Sweating   Family History  Problem Relation Age of Onset  . Hypertension Mother   . Kidney disease Sister   . Depression Paternal Grandfather   . Diabetes Brother    PE: BP 108/68 (BP Location: Left Arm, Patient Position: Sitting, Cuff Size: Normal)   Pulse 89   Temp 97.7 F (36.5 C) (Oral)   Wt 198 lb 6.4 oz (90 kg)   SpO2 94%   BMI 34.06 kg/m  Body mass index is 34.06 kg/m.   Wt Readings from Last 3 Encounters:  05/31/16 198 lb 6.4 oz (90 kg)  05/12/16 203 lb (92.1 kg)  04/26/16 203 lb (92.1 kg)   Constitutional: overweight, in NAD, walks with a cane, appears SOB Eyes: PERRLA, EOMI, no exophthalmos ENT: moist mucous membranes, no thyromegaly, no cervical lymphadenopathy Cardiovascular: RRR,+1/6 SEM, no RG, no LR edema Respiratory: CTA B Gastrointestinal: abdomen soft, NT, ND, BS+ Musculoskeletal: no deformities, strength intact in all 4 Skin: moist, warm, no rashes Neurological: no tremor with outstretched hands, DTR normal in  all 4  ASSESSMENT: 1. DM2, insulin-dependent, uncontrolled, with complications -  PN  PLAN:  1. Patient with long-standing, uncontrolled diabetes, on oral antidiabetic regimen + intermediate-acting insulin. Last summer, as she could not afford Jardiance any more (doughnut hole) >> we switched to Amaryl 2x a day. Her sugars have improved to the point of low CBGs in am >> will reduce the NPH at night. I advised her to check more sugars later in the day. - I suggested:  Patient Instructions  Please continue: - Metformin XR 500 mg 2x a day with meals. - Amaryl (Glimepiride) 2 mg 2x a day, before b'fast and dinner.  Please decrease: - NPH 25 units in am and 15 units at bedtime.  Read the books:  Dr. Alyssa Grove - "Program for Reversing Diabetes" Dr. Alden Benjamin - "How Not to Die"  Please return in 3 months with your sugar log.  - continue checking sugars at different times of the day - check 1-2 times a day, rotating checks - advised for yearly eye exams >> she needs one >> again advised to schedule - checked HbA1c today >> 6.7% (better) - she is UTD with flu shot  - Return to clinic in 3 mo with sugar log   Philemon Kingdom, MD PhD [facility]

## 2016-05-31 NOTE — Patient Instructions (Addendum)
Please continue: - Metformin XR 500 mg 2x a day with meals. - Amaryl (Glimepiride) 2 mg 2x a day, before b'fast and dinner.  Please decrease: - NPH 25 units in am and 15 units at bedtime.  Read the books:  Dr. Alyssa Grove - "Program for Reversing Diabetes" Dr. Alden Benjamin - "How Not to Die"  Please return in 3 months with your sugar log.

## 2016-06-10 DIAGNOSIS — K7469 Other cirrhosis of liver: Secondary | ICD-10-CM | POA: Diagnosis not present

## 2016-06-14 ENCOUNTER — Telehealth: Payer: Self-pay | Admitting: Internal Medicine

## 2016-06-14 ENCOUNTER — Other Ambulatory Visit: Payer: Self-pay

## 2016-06-14 MED ORDER — "INSULIN SYRINGE-NEEDLE U-100 31G X 15/64"" 0.3 ML MISC": 3 refills | Status: DC

## 2016-06-14 NOTE — Telephone Encounter (Signed)
Submitted

## 2016-06-14 NOTE — Telephone Encounter (Signed)
Please send in the insulin syringes to [facility] thank you

## 2016-06-25 ENCOUNTER — Emergency Department (HOSPITAL_COMMUNITY): Payer: Medicare Other

## 2016-06-25 ENCOUNTER — Inpatient Hospital Stay (HOSPITAL_COMMUNITY)
Admission: EM | Admit: 2016-06-25 | Discharge: 2016-06-30 | DRG: 433 | Disposition: A | Discharge status: Home / Self Care | Payer: Medicare Other | Attending: Family Medicine | Admitting: Family Medicine

## 2016-06-25 ENCOUNTER — Telehealth: Payer: Self-pay | Admitting: Internal Medicine

## 2016-06-25 ENCOUNTER — Other Ambulatory Visit: Payer: Self-pay

## 2016-06-25 ENCOUNTER — Encounter (HOSPITAL_COMMUNITY): Payer: Self-pay

## 2016-06-25 DIAGNOSIS — Z79899 Other long term (current) drug therapy: Secondary | ICD-10-CM

## 2016-06-25 DIAGNOSIS — R0602 Shortness of breath: Secondary | ICD-10-CM | POA: Diagnosis not present

## 2016-06-25 DIAGNOSIS — N179 Acute kidney failure, unspecified: Secondary | ICD-10-CM | POA: Diagnosis present

## 2016-06-25 DIAGNOSIS — I4891 Unspecified atrial fibrillation: Secondary | ICD-10-CM | POA: Diagnosis present

## 2016-06-25 DIAGNOSIS — I110 Hypertensive heart disease with heart failure: Secondary | ICD-10-CM | POA: Diagnosis present

## 2016-06-25 DIAGNOSIS — K219 Gastro-esophageal reflux disease without esophagitis: Secondary | ICD-10-CM | POA: Diagnosis present

## 2016-06-25 DIAGNOSIS — Z794 Long term (current) use of insulin: Secondary | ICD-10-CM

## 2016-06-25 DIAGNOSIS — I864 Gastric varices: Secondary | ICD-10-CM | POA: Diagnosis present

## 2016-06-25 DIAGNOSIS — K7469 Other cirrhosis of liver: Principal | ICD-10-CM | POA: Diagnosis present

## 2016-06-25 DIAGNOSIS — K766 Portal hypertension: Secondary | ICD-10-CM | POA: Diagnosis present

## 2016-06-25 DIAGNOSIS — R188 Other ascites: Secondary | ICD-10-CM | POA: Diagnosis not present

## 2016-06-25 DIAGNOSIS — Z888 Allergy status to other drugs, medicaments and biological substances status: Secondary | ICD-10-CM

## 2016-06-25 DIAGNOSIS — K7290 Hepatic failure, unspecified without coma: Secondary | ICD-10-CM | POA: Diagnosis present

## 2016-06-25 DIAGNOSIS — Z880 Allergy status to penicillin: Secondary | ICD-10-CM

## 2016-06-25 DIAGNOSIS — E785 Hyperlipidemia, unspecified: Secondary | ICD-10-CM | POA: Diagnosis present

## 2016-06-25 DIAGNOSIS — R10823 Right lower quadrant rebound abdominal tenderness: Secondary | ICD-10-CM | POA: Diagnosis not present

## 2016-06-25 DIAGNOSIS — J9811 Atelectasis: Secondary | ICD-10-CM | POA: Diagnosis present

## 2016-06-25 DIAGNOSIS — I4892 Unspecified atrial flutter: Secondary | ICD-10-CM | POA: Diagnosis not present

## 2016-06-25 DIAGNOSIS — I471 Supraventricular tachycardia: Secondary | ICD-10-CM

## 2016-06-25 DIAGNOSIS — Z951 Presence of aortocoronary bypass graft: Secondary | ICD-10-CM

## 2016-06-25 DIAGNOSIS — R109 Unspecified abdominal pain: Secondary | ICD-10-CM | POA: Diagnosis not present

## 2016-06-25 DIAGNOSIS — R1031 Right lower quadrant pain: Secondary | ICD-10-CM | POA: Diagnosis not present

## 2016-06-25 DIAGNOSIS — E119 Type 2 diabetes mellitus without complications: Secondary | ICD-10-CM | POA: Diagnosis present

## 2016-06-25 DIAGNOSIS — I8500 Esophageal varices without bleeding: Secondary | ICD-10-CM | POA: Diagnosis present

## 2016-06-25 DIAGNOSIS — E8809 Other disorders of plasma-protein metabolism, not elsewhere classified: Secondary | ICD-10-CM | POA: Diagnosis present

## 2016-06-25 DIAGNOSIS — I959 Hypotension, unspecified: Secondary | ICD-10-CM | POA: Diagnosis present

## 2016-06-25 DIAGNOSIS — Z720 Tobacco use: Secondary | ICD-10-CM | POA: Diagnosis not present

## 2016-06-25 DIAGNOSIS — I4719 Other supraventricular tachycardia: Secondary | ICD-10-CM | POA: Diagnosis present

## 2016-06-25 DIAGNOSIS — K7460 Unspecified cirrhosis of liver: Secondary | ICD-10-CM | POA: Diagnosis not present

## 2016-06-25 DIAGNOSIS — I5032 Chronic diastolic (congestive) heart failure: Secondary | ICD-10-CM | POA: Diagnosis present

## 2016-06-25 HISTORY — DX: Other cirrhosis of liver: K74.69

## 2016-06-25 HISTORY — DX: Unspecified atrial flutter: I48.92

## 2016-06-25 HISTORY — DX: Other ascites: R18.8

## 2016-06-25 HISTORY — DX: Gastric varices: I86.4

## 2016-06-25 HISTORY — DX: Chronic diastolic (congestive) heart failure: I50.32

## 2016-06-25 HISTORY — DX: Benign neoplasm of pancreas: D13.6

## 2016-06-25 HISTORY — DX: Tobacco use: Z72.0

## 2016-06-25 LAB — BASIC METABOLIC PANEL
Anion gap: 12 (ref 5–15)
BUN: 47 mg/dL — AB (ref 6–20)
CHLORIDE: 100 mmol/L — AB (ref 101–111)
CO2: 21 mmol/L — AB (ref 22–32)
CREATININE: 2.1 mg/dL — AB (ref 0.44–1.00)
Calcium: 9.7 mg/dL (ref 8.9–10.3)
GFR calc Af Amer: 27 mL/min — ABNORMAL LOW (ref >60)
GFR calc non Af Amer: 23 mL/min — ABNORMAL LOW (ref >60)
Glucose, Bld: 159 mg/dL — ABNORMAL HIGH (ref 65–99)
POTASSIUM: 5.1 mmol/L (ref 3.5–5.1)
Sodium: 133 mmol/L — ABNORMAL LOW (ref 135–145)

## 2016-06-25 LAB — HEPATIC FUNCTION PANEL
ALK PHOS: 45 U/L (ref 38–126)
ALT: 18 U/L (ref 14–54)
AST: 27 U/L (ref 15–41)
Albumin: 3.2 g/dL — ABNORMAL LOW (ref 3.5–5.0)
BILIRUBIN TOTAL: 1.1 mg/dL (ref 0.3–1.2)
Bilirubin, Direct: 0.3 mg/dL (ref 0.1–0.5)
Indirect Bilirubin: 0.8 mg/dL (ref 0.3–0.9)
TOTAL PROTEIN: 7.2 g/dL (ref 6.5–8.1)

## 2016-06-25 LAB — CBC
HEMATOCRIT: 41.6 % (ref 36.0–46.0)
Hemoglobin: 14.1 g/dL (ref 12.0–15.0)
MCH: 32.5 pg (ref 26.0–34.0)
MCHC: 33.9 g/dL (ref 30.0–36.0)
MCV: 95.9 fL (ref 78.0–100.0)
PLATELETS: 272 10*3/uL (ref 150–400)
RBC: 4.34 MIL/uL (ref 3.87–5.11)
RDW: 14.3 % (ref 11.5–15.5)
WBC: 8.1 10*3/uL (ref 4.0–10.5)

## 2016-06-25 LAB — I-STAT TROPONIN, ED: Troponin i, poc: 0 ng/mL (ref 0.00–0.08)

## 2016-06-25 LAB — BRAIN NATRIURETIC PEPTIDE: B NATRIURETIC PEPTIDE 5: 148.4 pg/mL — AB (ref 0.0–100.0)

## 2016-06-25 MED ORDER — METFORMIN HCL ER 500 MG PO TB24: 500.0000 mg | ORAL_TABLET | Freq: Two times a day (BID) | ORAL | 1 refills | Status: DC

## 2016-06-25 MED ORDER — SODIUM CHLORIDE 0.9 % IV SOLN
INTRAVENOUS | Status: DC
  Administered 2016-06-25: via INTRAVENOUS

## 2016-06-25 MED ORDER — SODIUM CHLORIDE 0.9 % IV BOLUS (SEPSIS)
500.0000 mL | Freq: Once | INTRAVENOUS | Status: AC
  Administered 2016-06-25: 500 mL via INTRAVENOUS

## 2016-06-25 MED ORDER — IPRATROPIUM-ALBUTEROL 0.5-2.5 (3) MG/3ML IN SOLN: 3.0000 mL | RESPIRATORY_TRACT | Status: AC

## 2016-06-25 MED ORDER — IPRATROPIUM-ALBUTEROL 0.5-2.5 (3) MG/3ML IN SOLN
3.0000 mL | RESPIRATORY_TRACT | Status: DC | PRN
  Administered 2016-06-26 – 2016-06-27 (×3): 3 mL via RESPIRATORY_TRACT
  Filled 2016-06-25 (×3): qty 3

## 2016-06-25 NOTE — Telephone Encounter (Signed)
Pt needs rx for metformin with the increased dosing call into [facility] on [address]

## 2016-06-25 NOTE — ED Triage Notes (Signed)
Pt states she was sent here by her PCP for fluid overload. Pt noted to be slightly tacypnic. Abdomen appears distended.

## 2016-06-25 NOTE — Telephone Encounter (Signed)
Submitted the metformin 500 into pharmacy per [place] note which was take 1 tablet 2 times daily.

## 2016-06-25 NOTE — ED Provider Notes (Signed)
[name] DEPT Provider Note   CSN: [id number] Arrival date & time: 06/25/16  1413     History   Chief Complaint Chief Complaint  Patient presents with  . Ascites    HPI Natalie Lane is a 71 y.o. female.  Patient is a 71 year old female with a history of diabetes, prior congenital heart disease, a flutter, cirrhosis with gastric varices who presents with a recurrence of her ascites. She's had a paracentesis multiple times in the past. She's followed by Dr. Clemencia Course with gastroenterology. She states she's had worsening swelling of her abdomen and shortness of breath which she feels is related to her swelling. No chest pain or tightness. She has a dry cough which she attributes to the pollen. She's had some increasing pain to her right lower abdomen. This is where she's previously had a paracentesis. She saw her gastroenterologist this morning who is concerned about the amount of pain that she was having an center here for a possible CT scan. She denies any fevers. No vomiting or diarrhea. She's had some recent constipation which has improved with laxatives. She denies any swelling of her legs.      Past Medical History:  Diagnosis Date  . Anemia   . Atrial flutter ([place])   . 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   . CHF (congestive heart failure) ([place])   . Cirrhosis ([place])   . Complication of anesthesia   . Diabetes mellitus, type 2 ([place])   . Dyspnea   . Dysrhythmia    AFlutter  . Edema   . GERD (gastroesophageal reflux disease)   . History of blood transfusion   . Hyperlipidemia   . Normal stress echocardiogram 2008  . PONV (postoperative nausea and vomiting)    with Cholecystectomy    Patient Active Problem List   Diagnosis Date Noted  . PAT (paroxysmal atrial tachycardia) ([place]) 05/12/2016  . Ascites 05/10/2016  . Decompensated hepatic cirrhosis ([place]) 05/10/2016  . Esophageal varices ([place])  01/23/2014  . Cirrhosis ([place]) 11/16/2013  . Chest pain at rest 09/23/2011  . Heart palpitations 12/03/2010  . Atrial flutter ([place])   . CHD (congenital heart disease)   . Edema   . Hyperlipidemia   . Poorly controlled type 2 diabetes mellitus with peripheral neuropathy [facility])     Past Surgical History:  Procedure Laterality Date  . ABDOMINAL HYSTERECTOMY    . ASD versus VSD repair  1978   Operative notes not available.  . ATRIAL ABLATION SURGERY  10 /2011  . [place]   x2  . CHOLECYSTECTOMY    . CORONARY ARTERY BYPASS GRAFT  1978  . ESOPHAGOGASTRODUODENOSCOPY (EGD) WITH PROPOFOL N/A 11/16/2013   Procedure: ESOPHAGOGASTRODUODENOSCOPY (EGD) WITH PROPOFOL;  Surgeon: Lear Ng, MD;  Location: [facility];  Service: Endoscopy;  Laterality: N/A;  . ESOPHAGOGASTRODUODENOSCOPY (EGD) WITH PROPOFOL N/A 01/23/2014   Procedure: ESOPHAGOGASTRODUODENOSCOPY (EGD) WITH PROPOFOL;  Surgeon: Lear Ng, MD;  Location: [facility];  Service: Endoscopy;  Laterality: N/A;  . GASTRIC VARICES BANDING N/A 11/16/2013   Procedure: GASTRIC VARICES BANDING;  Surgeon: Lear Ng, MD;  Location: [facility];  Service: Endoscopy;  Laterality: N/A;  . GASTRIC VARICES BANDING N/A 01/23/2014   Procedure: GASTRIC VARICES BANDING;  Surgeon: Lear Ng, MD;  Location: [facility];  Service: Endoscopy;  Laterality: N/A;  . HERNIA REPAIR    . IR GENERIC HISTORICAL  05/11/2016  IR PARACENTESIS 05/11/2016 Ascencion Dike, PA-C [facility]INTER[facility] RAD  . OTHER SURGICAL HISTORY     partial hysterectomy    OB History    No data available       Home Medications    Prior to Admission medications   Medication Sig Start Date End Date Taking? Authorizing Provider  diphenhydrAMINE (BENADRYL) 25 MG tablet Take 25 mg by mouth every 6 (six) hours as needed for allergies.   Yes [provider]  docusate sodium (COLACE) 100 MG capsule Take 100 mg by mouth 2 (two) times daily as needed  for mild constipation.   Yes [provider]  furosemide (LASIX) 40 MG tablet Take 1 tablet (40 mg total) by mouth daily. Patient taking differently: Take 60 mg by mouth daily.  05/12/16  Yes Verlee Monte, MD  glimepiride (AMARYL) 2 MG tablet Take 1 tablet (2 mg total) by mouth 2 (two) times daily before a meal. Patient taking differently: Take 2 mg by mouth 2 (two) times daily.  02/26/16  Yes Philemon Kingdom, MD  ibuprofen (ADVIL,MOTRIN) 200 MG tablet Take 200 mg by mouth every 6 (six) hours as needed for headache, mild pain or moderate pain.    Yes [provider]  insulin NPH Human (HUMULIN N,NOVOLIN N) 100 UNIT/ML injection ReliOn brand - Pt uses 25 units in the morning and 15 units at bedtime. Patient taking differently: Inject 15-25 Units into the skin. ReliOn brand - Pt uses 25 units in the morning and 15 units at bedtime. 05/31/16  Yes Philemon Kingdom, MD  Insulin Syringe-Needle U-100 (RELION INSULIN SYRINGE) 31G X 15/64" 0.3 ML MISC Use to inject insulin two times daily 06/14/16  Yes Philemon Kingdom, MD  lactulose (CHRONULAC) 10 GM/15ML solution Take 10 g by mouth 2 (two) times daily.    Yes [provider]  loperamide (IMODIUM A-D) 2 MG tablet Take 2 mg by mouth 4 (four) times daily as needed for diarrhea or loose stools.   Yes [provider]  metFORMIN (GLUCOPHAGE-XR) 500 MG 24 hr tablet Take 1 tablet (500 mg total) by mouth 2 (two) times daily with a meal. 06/25/16  Yes Philemon Kingdom, MD  propranolol (INDERAL) 20 MG tablet Take 1 tablet (20 mg total) by mouth 2 (two) times daily. 08/06/15  Yes Burtis Junes, NP  ranitidine (ZANTAC) 150 MG capsule Take 150 mg by mouth daily as needed for heartburn.   Yes [provider]  simethicone (MYLICON) 80 MG chewable tablet Chew 80 mg by mouth 4 (four) times daily as needed for flatulence.   Yes [provider]  spironolactone (ALDACTONE) 100 MG tablet Take 1 tablet (100 mg total) by  mouth daily. Patient taking differently: Take 150 mg by mouth daily.  05/12/16  Yes Verlee Monte, MD  rifaximin (XIFAXAN) 550 MG TABS tablet Take 550 mg by mouth 2 (two) times daily.    [provider]    Family History Family History  Problem Relation Age of Onset  . Hypertension Mother   . Kidney disease Sister   . Depression Paternal Grandfather   . Diabetes Brother     Social History Social History  Substance Use Topics  . Smoking status: Former Smoker    Packs/day: 0.50    Years: 36.00    Types: Cigarettes  . Smokeless tobacco: Never Used  . Alcohol use No     Allergies   Monosodium glutamate; Red dye; Erythromycin; Penicillins; Statins; Sulfa antibiotics; Glipizide; Iohexol; and Spironolactone   Review  of Systems Review of Systems  Constitutional: Negative for chills, diaphoresis, fatigue and fever.  HENT: Negative for congestion, rhinorrhea and sneezing.   Eyes: Negative.   Respiratory: Positive for shortness of breath. Negative for cough and chest tightness.   Cardiovascular: Negative for chest pain and leg swelling.  Gastrointestinal: Positive for abdominal distention, abdominal pain and constipation. Negative for blood in stool, diarrhea, nausea and vomiting.  Genitourinary: Negative for difficulty urinating, flank pain, frequency and hematuria.  Musculoskeletal: Negative for arthralgias and back pain.  Skin: Negative for rash.  Neurological: Negative for dizziness, speech difficulty, weakness, numbness and headaches.     Physical Exam Updated Vital Signs BP 105/64   Pulse 93   Temp 98.2 F (36.8 C) (Oral)   Resp (!) 21   SpO2 97%   Physical Exam  Constitutional: She is oriented to person, place, and time. She appears well-developed and well-nourished.  HENT:  Head: Normocephalic and atraumatic.  Eyes: Pupils are equal, round, and reactive to light.  Neck: Normal range of motion. Neck supple.  Cardiovascular: Normal rate, regular rhythm  and normal heart sounds.   Pulmonary/Chest: Effort normal and breath sounds normal. No respiratory distress. She has no wheezes. She has no rales. She exhibits no tenderness.  Abdominal: Soft. Bowel sounds are normal. There is tenderness (Positive tenderness to the right lower abdomen. There is no redness or induration noted to the abdominal wall). There is no rebound and no guarding.  Musculoskeletal: Normal range of motion. She exhibits no edema.  Lymphadenopathy:    She has no cervical adenopathy.  Neurological: She is alert and oriented to person, place, and time.  Skin: Skin is warm and dry. No rash noted.  Psychiatric: She has a normal mood and affect.     ED Treatments / Results  Labs (all labs ordered are listed, but only abnormal results are displayed) Labs Reviewed  BASIC METABOLIC PANEL - Abnormal; Notable for the following:       Result Value   Sodium 133 (*)    Chloride 100 (*)    CO2 21 (*)    Glucose, Bld 159 (*)    BUN 47 (*)    Creatinine, Ser 2.10 (*)    GFR calc non Af Amer 23 (*)    GFR calc Af Amer 27 (*)    All other components within normal limits  HEPATIC FUNCTION PANEL - Abnormal; Notable for the following:    Albumin 3.2 (*)    All other components within normal limits  BRAIN NATRIURETIC PEPTIDE - Abnormal; Notable for the following:    B Natriuretic Peptide 148.4 (*)    All other components within normal limits  CBC  I-STAT TROPOININ, ED    EKG  EKG Interpretation  Date/Time:  Friday Jun 25 2016 17:51:36 EDT Ventricular Rate:  133 PR Interval:  158 QRS Duration: 90 QT Interval:  303 QTC Calculation: 451 R Axis:   91 Text Interpretation:  Right and left arm electrode reversal, interpretation assumes no reversal Atrial tachycardia Right axis deviation Low voltage, precordial leads Abnormal T, consider ischemia, anterior leads Baseline wander in lead(s) II III aVF Confirmed by Falisa Lamora  MD, Kemuel Buchmann ([id number]) on 06/25/2016 9:34:36 PM        Radiology Ct Abdomen Pelvis Wo Contrast  Result Date: 06/25/2016 CLINICAL DATA:  Ascites, abdominal distention for 1 month. Right lower quadrant abdominal pain. EXAM: CT ABDOMEN AND PELVIS WITHOUT CONTRAST TECHNIQUE: Multidetector CT imaging of the abdomen and pelvis was performed following  the standard protocol without IV contrast. COMPARISON:  Most recent CT 07/02/2013 FINDINGS: Lower chest: Linear atelectasis in the lung bases, right greater than left. No pleural fluid. Normal heart size with left atrial enlargement. There are coronary artery calcifications. Hepatobiliary: Cirrhotic hepatic morphology with nodular contours. No discrete focal lesion on noncontrast exam. Clips in the gallbladder fossa postcholecystectomy. No biliary dilatation. Pancreas: No ductal dilatation or inflammation. Punctate calcification in the uncinate process is unchanged. Spleen: Normal in size without focal abnormality.  No splenomegaly. Adrenals/Urinary Tract: Normal adrenal glands. No hydronephrosis or perinephric edema. No urolithiasis. Urinary bladder is physiologically distended without wall thickening. Stomach/Bowel: Limited assessment secondary to lack of oral contrast on presence of intra-abdominal ascites. No evidence of obstruction. Colonic diverticulosis without inflammation. The appendix is not visualized. Small bowel is decompressed. Stomach is minimally distended. Probable perigastric varices. Vascular/Lymphatic: Aortic and branch atherosclerosis without aneurysm. Limited assessment for adenopathy given lack of contrast and presence of intra-abdominal ascites, no gross adenopathy is seen. Reproductive: Post hysterectomy.  No evidence of adnexal mass. Other: Large volume of abdominopelvic ascites. Fluid measures simple density. No free air. There is associated mesenteric edema. Mild body wall edema anteriorly. Musculoskeletal: Unchanged bone island in the right acetabulum. There are no acute or suspicious osseous  abnormalities. Degenerative disc disease and facet arthropathy in the lumbar spine. IMPRESSION: 1. Cirrhotic liver with large volume abdominopelvic ascites. 2. No definite acute abnormality apparent 3. Aortic and branch atherosclerosis. Electronically Signed   By: Jeb Levering M.D.   On: 06/25/2016 17:53   Dg Chest 2 View  Result Date: 06/25/2016 CLINICAL DATA:  Chronic shortness of breath. EXAM: CHEST  2 VIEW COMPARISON:  Radiographs of May 10, 2016. FINDINGS: Stable cardiomediastinal silhouette. Sternotomy wires are noted. Hypoinflation of the lungs is noted. Mild bibasilar subsegmental atelectasis is noted. No pneumothorax or pleural effusion is noted. Bony thorax is unremarkable. IMPRESSION: Hypoinflation of the lungs. Mild bibasilar subsegmental atelectasis. Electronically Signed   By: Marijo Conception, M.D.   On: 06/25/2016 15:36    Procedures Procedures (including critical care time)  Medications Ordered in ED Medications  sodium chloride 0.9 % bolus 500 mL (500 mLs Intravenous New Bag/Given 06/25/16 2147)     Initial Impression / Assessment and Plan / ED Course  I have reviewed the triage vital signs and the nursing notes.  Pertinent labs & imaging results that were available during my care of the patient were reviewed by me and considered in my medical decision making (see chart for details).     Pt presents with ascites and RLQ pain.  CT does not show any acute abnormalities other than ascites.  Will need paracentesis.  She does have an AKI and her BP has dropped intermittently into 90s.  Will give fluids cautiously.  She has had 4-5 episodes of an atrial tachycardia that lasts a few minutes and self converts.  I spoke with Dr. Tamala Julian who will admit the pt.  I spoke with radiology MD who says that I just need to put an order in to have paracentesis done tomorrow.  Final Clinical Impressions(s) / ED Diagnoses   Final diagnoses:  Other ascites  PAT (paroxysmal atrial  tachycardia) ([place])  AKI (acute kidney injury) ([place])    New Prescriptions New Prescriptions   No medications on file     Malvin Johns, MD 06/25/16 2209

## 2016-06-25 NOTE — ED Notes (Signed)
Patient transported to CT 

## 2016-06-25 NOTE — ED Notes (Signed)
Lab contacted to add on BNP and Hepatic function panel

## 2016-06-25 NOTE — H&P (Signed)
History and Physical    [name] AST:[id number] DOB: May 20, 1945 DOA: 06/25/2016  Referring MD/NP/PA:Dr. Tamera Punt PCP: Carol Ada, MD  Patient coming from: Home   Chief Complaint: Right flank pain  HPI: Natalie Lane is a 71 y.o. female with medical history significant of congenital heart disease (?VSD), history of atrial flutter, cryptogenic cirrhosis with ascites and gastric varicess/p banding. She is followed by Dr. Michail Sermon with gastroenterology. Patient was admitted on 3/26-3/28 and to [facility] for ascites. At that time patient had undergone her first paracentesis and following had been discharged home on Lasix 40 mg daily and Aldactone 100 mg. Patient reports following up immediately with Dr. Michail Sermon of gastroenterology for which she was recommended to increase Lasix to 60 mg and Aldactone 150 mg daily. Patient reports being compliant with medications as recommended. Since being discharged home the patient notes that she's had difficulty breathing, intermittent wheezing, abdominal distention, decreased blood pressures, slow heart rates. She had her four-week follow-up appointment today since the previous medication changes.  During the appointment she complained of some right flank pain. Pain has actually been present since she was last discharged from the hospital and describes it as sharp like a needle, but usually is relieved with repositioning herself.   ED Course: Intermittent atrial tachycardia noted into the 130's on admission along with initial low blood pressures. Labs revealed elevated creatinine of 2.1 (previously wnl), BUN 47, INR 1.2. CXR so hyper will ablation with atelectasis  Review of Systems: As per HPI otherwise 10 point review of systems negative.   Past Medical History:  Diagnosis Date  . Anemia   . Atrial flutter ([place])   . 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   . CHF (congestive heart failure)  ([place])   . Cirrhosis ([place])   . Complication of anesthesia   . Diabetes mellitus, type 2 ([place])   . Dyspnea   . Dysrhythmia    AFlutter  . Edema   . GERD (gastroesophageal reflux disease)   . History of blood transfusion   . Hyperlipidemia   . Normal stress echocardiogram 2008  . PONV (postoperative nausea and vomiting)    with Cholecystectomy    Past Surgical History:  Procedure Laterality Date  . ABDOMINAL HYSTERECTOMY    . ASD versus VSD repair  1978   Operative notes not available.  . ATRIAL ABLATION SURGERY  10 /2011  . [place]   x2  . CHOLECYSTECTOMY    . CORONARY ARTERY BYPASS GRAFT  1978  . ESOPHAGOGASTRODUODENOSCOPY (EGD) WITH PROPOFOL N/A 11/16/2013   Procedure: ESOPHAGOGASTRODUODENOSCOPY (EGD) WITH PROPOFOL;  Surgeon: Lear Ng, MD;  Location: [place];  Service: Endoscopy;  Laterality: N/A;  . ESOPHAGOGASTRODUODENOSCOPY (EGD) WITH PROPOFOL N/A 01/23/2014   Procedure: ESOPHAGOGASTRODUODENOSCOPY (EGD) WITH PROPOFOL;  Surgeon: Lear Ng, MD;  Location: [place];  Service: Endoscopy;  Laterality: N/A;  . GASTRIC VARICES BANDING N/A 11/16/2013   Procedure: GASTRIC VARICES BANDING;  Surgeon: Lear Ng, MD;  Location: [place];  Service: Endoscopy;  Laterality: N/A;  . GASTRIC VARICES BANDING N/A 01/23/2014   Procedure: GASTRIC VARICES BANDING;  Surgeon: Lear Ng, MD;  Location: [place];  Service: Endoscopy;  Laterality: N/A;  . HERNIA REPAIR    . IR GENERIC HISTORICAL  05/11/2016   IR PARACENTESIS 05/11/2016 Ascencion Dike, PA-C [facility]INTER[facility]  . OTHER SURGICAL HISTORY     partial hysterectomy  reports that she has quit smoking. Her smoking use included Cigarettes. She has a 18.00 pack-year smoking history. She has never used smokeless tobacco. She reports that she does not drink alcohol or use drugs.  Allergies  Allergen Reactions  . Monosodium Glutamate Anaphylaxis    MSG  . Red Dye Anaphylaxis  .  Erythromycin Other (See Comments)    Reaction:  Migraine   . Penicillins Other (See Comments)    Reaction:  Migraine  Has patient had a PCN reaction causing immediate rash, facial/tongue/throat swelling, SOB or lightheadedness with hypotension: No Has patient had a PCN reaction causing severe rash involving mucus membranes or skin necrosis: No Has patient had a PCN reaction that required hospitalization No Has patient had a PCN reaction occurring within the last 10 years: No If all of the above answers are "NO", then may proceed with Cephalosporin use.  . Statins Other (See Comments)    Reaction:  Severe leg cramps   . Sulfa Antibiotics Hives  . Glipizide Rash  . Iohexol Rash  . Spironolactone Palpitations and Other (See Comments)    Sweating    Family History  Problem Relation Age of Onset  . Hypertension Mother   . Kidney disease Sister   . Depression Paternal Grandfather   . Diabetes Brother     Prior to Admission medications   Medication Sig Start Date End Date Taking? Authorizing Provider  diphenhydrAMINE (BENADRYL) 25 MG tablet Take 25 mg by mouth every 6 (six) hours as needed for allergies.   Yes [provider]  docusate sodium (COLACE) 100 MG capsule Take 100 mg by mouth 2 (two) times daily as needed for mild constipation.   Yes [provider]  furosemide (LASIX) 40 MG tablet Take 1 tablet (40 mg total) by mouth daily. Patient taking differently: Take 60 mg by mouth daily.  05/12/16  Yes Verlee Monte, MD  glimepiride (AMARYL) 2 MG tablet Take 1 tablet (2 mg total) by mouth 2 (two) times daily before a meal. Patient taking differently: Take 2 mg by mouth 2 (two) times daily.  02/26/16  Yes Philemon Kingdom, MD  ibuprofen (ADVIL,MOTRIN) 200 MG tablet Take 200 mg by mouth every 6 (six) hours as needed for headache, mild pain or moderate pain.    Yes [provider]  insulin NPH Human (HUMULIN N,NOVOLIN N) 100 UNIT/ML injection ReliOn brand - Pt  uses 25 units in the morning and 15 units at bedtime. Patient taking differently: Inject 15-25 Units into the skin. ReliOn brand - Pt uses 25 units in the morning and 15 units at bedtime. 05/31/16  Yes Philemon Kingdom, MD  Insulin Syringe-Needle U-100 (RELION INSULIN SYRINGE) 31G X 15/64" 0.3 ML MISC Use to inject insulin two times daily 06/14/16  Yes Philemon Kingdom, MD  lactulose (CHRONULAC) 10 GM/15ML solution Take 10 g by mouth 2 (two) times daily.    Yes [provider]  loperamide (IMODIUM A-D) 2 MG tablet Take 2 mg by mouth 4 (four) times daily as needed for diarrhea or loose stools.   Yes [provider]  metFORMIN (GLUCOPHAGE-XR) 500 MG 24 hr tablet Take 1 tablet (500 mg total) by mouth 2 (two) times daily with a meal. 06/25/16  Yes Philemon Kingdom, MD  propranolol (INDERAL) 20 MG tablet Take 1 tablet (20 mg total) by mouth 2 (two) times daily. 08/06/15  Yes Burtis Junes, NP  ranitidine (ZANTAC) 150 MG capsule Take 150 mg by mouth daily as needed for heartburn.  Yes [provider]  simethicone (MYLICON) 80 MG chewable tablet Chew 80 mg by mouth 4 (four) times daily as needed for flatulence.   Yes [provider]  spironolactone (ALDACTONE) 100 MG tablet Take 1 tablet (100 mg total) by mouth daily. Patient taking differently: Take 150 mg by mouth daily.  05/12/16  Yes Verlee Monte, MD  rifaximin (XIFAXAN) 550 MG TABS tablet Take 550 mg by mouth 2 (two) times daily.    [provider]    Physical Exam:  Constitutional:Elderly female who appears to be in mild  discomfort unless sitting upright Vitals:   06/25/16 1730 06/25/16 1745 06/25/16 1830 06/25/16 2000  BP: (!) 114/54  110/78 105/64  Pulse: 91 (!) 133 90 93  Resp: (!) 25 (!) 23 (!) 23 (!) 21  Temp:      TempSrc:      SpO2: 93% 95% 95% 97%   Eyes: PERRL, lids and conjunctivae normal ENMT: Mucous membranes are moist. Posterior pharynx clear of any exudate or lesions..  Neck:  normal, supple, no masses, no thyromegaly Respiratory: Mild tachypneic with expiratory wheezes appreciated, no crackles. No accessory muscle use.  Cardiovascular: Regular rate and rhythm, no murmurs / rubs / gallops. No extremity edema. 2+ pedal pulses. No carotid bruits.  Abdomen: Tenderness to palpation of the right lower quadrant. Positive distention with fluid wave present. Musculoskeletal: no clubbing / cyanosis. No joint deformity upper and lower extremities. Good ROM, no contractures. Normal muscle tone.  Skin: no rashes, lesions, ulcers. No induration Neurologic: CN 2-12 grossly intact. Sensation intact, DTR normal. Strength 5/5 in all 4.  Psychiatric: Normal judgment and insight. Alert and oriented x 3. Normal mood.     Labs on Admission: I have personally reviewed following labs and imaging studies  CBC:  Recent Labs Lab 06/25/16 1428  WBC 8.1  HGB 14.1  HCT 41.6  MCV 95.9  PLT 315   Basic Metabolic Panel:  Recent Labs Lab 06/25/16 1428  NA 133*  K 5.1  CL 100*  CO2 21*  GLUCOSE 159*  BUN 47*  CREATININE 2.10*  CALCIUM 9.7   GFR: CrCl cannot be calculated (Unknown ideal weight.). Liver Function Tests:  Recent Labs Lab 06/25/16 1658  AST 27  ALT 18  ALKPHOS 45  BILITOT 1.1  PROT 7.2  ALBUMIN 3.2*   No results for input(s): LIPASE, AMYLASE in the last 168 hours. No results for input(s): AMMONIA in the last 168 hours. Coagulation Profile: No results for input(s): INR, PROTIME in the last 168 hours. Cardiac Enzymes: No results for input(s): CKTOTAL, CKMB, CKMBINDEX, TROPONINI in the last 168 hours. BNP (last 3 results) No results for input(s): PROBNP in the last 8760 hours. HbA1C: No results for input(s): HGBA1C in the last 72 hours. CBG: No results for input(s): GLUCAP in the last 168 hours. Lipid Profile: No results for input(s): CHOL, HDL, LDLCALC, TRIG, CHOLHDL, LDLDIRECT in the last 72 hours. Thyroid Function Tests: No results for input(s):  TSH, T4TOTAL, FREET4, T3FREE, THYROIDAB in the last 72 hours. Anemia Panel: No results for input(s): VITAMINB12, FOLATE, FERRITIN, TIBC, IRON, RETICCTPCT in the last 72 hours. Urine analysis:    Component Value Date/Time   COLORURINE YELLOW 04/26/2016 [place] 04/26/2016 1217   LABSPEC 1.006 04/26/2016 1217   PHURINE 6.0 04/26/2016 1217   GLUCOSEU NEGATIVE 04/26/2016 1217   HGBUR SMALL (A) 04/26/2016 1217   BILIRUBINUR NEGATIVE 04/26/2016 1217   KETONESUR NEGATIVE 04/26/2016 1217   PROTEINUR NEGATIVE  04/26/2016 1217   NITRITE NEGATIVE 04/26/2016 1217   LEUKOCYTESUR NEGATIVE 04/26/2016 1217   Sepsis Labs: No results found for this or any previous visit (from the past 240 hour(s)).   Radiological Exams on Admission: Ct Abdomen Pelvis Wo Contrast  Result Date: 06/25/2016 CLINICAL DATA:  Ascites, abdominal distention for 1 month. Right lower quadrant abdominal pain. EXAM: CT ABDOMEN AND PELVIS WITHOUT CONTRAST TECHNIQUE: Multidetector CT imaging of the abdomen and pelvis was performed following the standard protocol without IV contrast. COMPARISON:  Most recent CT 07/02/2013 FINDINGS: Lower chest: Linear atelectasis in the lung bases, right greater than left. No pleural fluid. Normal heart size with left atrial enlargement. There are coronary artery calcifications. Hepatobiliary: Cirrhotic hepatic morphology with nodular contours. No discrete focal lesion on noncontrast exam. Clips in the gallbladder fossa postcholecystectomy. No biliary dilatation. Pancreas: No ductal dilatation or inflammation. Punctate calcification in the uncinate process is unchanged. Spleen: Normal in size without focal abnormality.  No splenomegaly. Adrenals/Urinary Tract: Normal adrenal glands. No hydronephrosis or perinephric edema. No urolithiasis. Urinary bladder is physiologically distended without wall thickening. Stomach/Bowel: Limited assessment secondary to lack of oral contrast on presence of  intra-abdominal ascites. No evidence of obstruction. Colonic diverticulosis without inflammation. The appendix is not visualized. Small bowel is decompressed. Stomach is minimally distended. Probable perigastric varices. Vascular/Lymphatic: Aortic and branch atherosclerosis without aneurysm. Limited assessment for adenopathy given lack of contrast and presence of intra-abdominal ascites, no gross adenopathy is seen. Reproductive: Post hysterectomy.  No evidence of adnexal mass. Other: Large volume of abdominopelvic ascites. Fluid measures simple density. No free air. There is associated mesenteric edema. Mild body wall edema anteriorly. Musculoskeletal: Unchanged bone island in the right acetabulum. There are no acute or suspicious osseous abnormalities. Degenerative disc disease and facet arthropathy in the lumbar spine. IMPRESSION: 1. Cirrhotic liver with large volume abdominopelvic ascites. 2. No definite acute abnormality apparent 3. Aortic and branch atherosclerosis. Electronically Signed   By: Jeb Levering M.D.   On: 06/25/2016 17:53   Dg Chest 2 View  Result Date: 06/25/2016 CLINICAL DATA:  Chronic shortness of breath. EXAM: CHEST  2 VIEW COMPARISON:  Radiographs of May 10, 2016. FINDINGS: Stable cardiomediastinal silhouette. Sternotomy wires are noted. Hypoinflation of the lungs is noted. Mild bibasilar subsegmental atelectasis is noted. No pneumothorax or pleural effusion is noted. Bony thorax is unremarkable. IMPRESSION: Hypoinflation of the lungs. Mild bibasilar subsegmental atelectasis. Electronically Signed   By: Marijo Conception, M.D.   On: 06/25/2016 15:36    EKG: Independently reviewed. Atrial tachycardia at 133 bpm  Assessment/Plan Decompensated hepatic cirrhosis with ascites: Patient followed by Dr. Michail Sermon previous history of esophageal varices s/p banding. Patient appears to have possibly been over diuresed. Question cause of right flank pain. - Admit to telemetry bed - Added on  PT/INR and APTT - Trial of albumin with Lasix 4min following  - Paracentesis in a.m. - Follow-up fluid studies  - May want to notify Dr. Marcille Blanco GI in a.m. for medication adjustment recommendations  Hypotension: Improved following 542ml bolus. - Gentle IV fluid hydration NS at 75 ml/hr  Acute renal failure: Patient's baseline creatinine previously noted to be within normal limits. Patient presents with elevated creatinine of 2.1 and BUN with a BUN of 47. Question prerenal given elevated BUN to creatinine ratio. - Check urinalysis   - IVF fluids   Paroxysmal atrial tachycardia - Follow-up telemetry overnight  Diabetes mellitus type 2: Last hemoglobin A1c was 6.7 on 05/31/16. - Hypoglycemic protocol - Hold  metformin - Cbgs with Sensitive SSI  Hypoalbuminemia: Chronic. Initial albumin 3.2 on admission.    Tobacco use - Discuss need of cessation of tobacco  - patient would like to be prescribed nicotine patch at discharge but did not want to add anything on top of current situation  DVT prophylaxis: Lovenox   Code Status: full Family Communication: Discussed the plan of care with the patient and husband present at bedside  Disposition Plan:  Likely discharge home following paracentesis vital signs remain stable. Consults called:  None  Admission status:Observation   Norval Morton MD [facility] Pager [phone]  If 7PM-7AM, please contact night-coverage [email or web address] Password [facility]  06/25/2016, 9:48 PM

## 2016-06-25 NOTE — ED Notes (Signed)
Pt. Is using bedside commode.

## 2016-06-26 ENCOUNTER — Observation Stay (HOSPITAL_COMMUNITY): Payer: Medicare Other

## 2016-06-26 DIAGNOSIS — I959 Hypotension, unspecified: Secondary | ICD-10-CM | POA: Diagnosis present

## 2016-06-26 DIAGNOSIS — R109 Unspecified abdominal pain: Secondary | ICD-10-CM | POA: Diagnosis not present

## 2016-06-26 DIAGNOSIS — I4892 Unspecified atrial flutter: Secondary | ICD-10-CM | POA: Diagnosis present

## 2016-06-26 DIAGNOSIS — I110 Hypertensive heart disease with heart failure: Secondary | ICD-10-CM | POA: Diagnosis present

## 2016-06-26 DIAGNOSIS — N179 Acute kidney failure, unspecified: Secondary | ICD-10-CM

## 2016-06-26 DIAGNOSIS — I5032 Chronic diastolic (congestive) heart failure: Secondary | ICD-10-CM | POA: Diagnosis present

## 2016-06-26 DIAGNOSIS — E119 Type 2 diabetes mellitus without complications: Secondary | ICD-10-CM | POA: Diagnosis present

## 2016-06-26 DIAGNOSIS — K7290 Hepatic failure, unspecified without coma: Secondary | ICD-10-CM | POA: Diagnosis not present

## 2016-06-26 DIAGNOSIS — Z794 Long term (current) use of insulin: Secondary | ICD-10-CM | POA: Diagnosis not present

## 2016-06-26 DIAGNOSIS — K766 Portal hypertension: Secondary | ICD-10-CM | POA: Diagnosis present

## 2016-06-26 DIAGNOSIS — R188 Other ascites: Secondary | ICD-10-CM | POA: Diagnosis not present

## 2016-06-26 DIAGNOSIS — K7031 Alcoholic cirrhosis of liver with ascites: Secondary | ICD-10-CM | POA: Diagnosis not present

## 2016-06-26 DIAGNOSIS — Z880 Allergy status to penicillin: Secondary | ICD-10-CM | POA: Diagnosis not present

## 2016-06-26 DIAGNOSIS — K219 Gastro-esophageal reflux disease without esophagitis: Secondary | ICD-10-CM | POA: Diagnosis present

## 2016-06-26 DIAGNOSIS — Z79899 Other long term (current) drug therapy: Secondary | ICD-10-CM | POA: Diagnosis not present

## 2016-06-26 DIAGNOSIS — I4891 Unspecified atrial fibrillation: Secondary | ICD-10-CM | POA: Diagnosis present

## 2016-06-26 DIAGNOSIS — I8500 Esophageal varices without bleeding: Secondary | ICD-10-CM | POA: Diagnosis present

## 2016-06-26 DIAGNOSIS — K7469 Other cirrhosis of liver: Secondary | ICD-10-CM | POA: Diagnosis not present

## 2016-06-26 DIAGNOSIS — E785 Hyperlipidemia, unspecified: Secondary | ICD-10-CM | POA: Diagnosis present

## 2016-06-26 DIAGNOSIS — I864 Gastric varices: Secondary | ICD-10-CM | POA: Diagnosis present

## 2016-06-26 DIAGNOSIS — K7460 Unspecified cirrhosis of liver: Secondary | ICD-10-CM | POA: Diagnosis not present

## 2016-06-26 DIAGNOSIS — Z888 Allergy status to other drugs, medicaments and biological substances status: Secondary | ICD-10-CM | POA: Diagnosis not present

## 2016-06-26 DIAGNOSIS — I471 Supraventricular tachycardia: Secondary | ICD-10-CM | POA: Diagnosis not present

## 2016-06-26 DIAGNOSIS — E8809 Other disorders of plasma-protein metabolism, not elsewhere classified: Secondary | ICD-10-CM | POA: Diagnosis present

## 2016-06-26 DIAGNOSIS — Z951 Presence of aortocoronary bypass graft: Secondary | ICD-10-CM | POA: Diagnosis not present

## 2016-06-26 DIAGNOSIS — Z720 Tobacco use: Secondary | ICD-10-CM | POA: Diagnosis present

## 2016-06-26 DIAGNOSIS — J9811 Atelectasis: Secondary | ICD-10-CM | POA: Diagnosis present

## 2016-06-26 LAB — BASIC METABOLIC PANEL
ANION GAP: 11 (ref 5–15)
BUN: 43 mg/dL — AB (ref 6–20)
CALCIUM: 9.2 mg/dL (ref 8.9–10.3)
CO2: 23 mmol/L (ref 22–32)
CREATININE: 1.89 mg/dL — AB (ref 0.44–1.00)
Chloride: 102 mmol/L (ref 101–111)
GFR calc Af Amer: 30 mL/min — ABNORMAL LOW (ref >60)
GFR, EST NON AFRICAN AMERICAN: 26 mL/min — AB (ref >60)
GLUCOSE: 148 mg/dL — AB (ref 65–99)
Potassium: 4.1 mmol/L (ref 3.5–5.1)
Sodium: 136 mmol/L (ref 135–145)

## 2016-06-26 LAB — BODY FLUID CELL COUNT WITH DIFFERENTIAL
EOS FL: 0 %
LYMPHS FL: 44 %
MONOCYTE-MACROPHAGE-SEROUS FLUID: 51 % (ref 50–90)
NEUTROPHIL FLUID: 5 % (ref 0–25)
Total Nucleated Cell Count, Fluid: 386 cu mm (ref 0–1000)

## 2016-06-26 LAB — URINALYSIS, ROUTINE W REFLEX MICROSCOPIC
BILIRUBIN URINE: NEGATIVE
Glucose, UA: NEGATIVE mg/dL
HGB URINE DIPSTICK: NEGATIVE
Ketones, ur: NEGATIVE mg/dL
NITRITE: POSITIVE — AB
Protein, ur: NEGATIVE mg/dL
Specific Gravity, Urine: 1.006 (ref 1.005–1.030)
pH: 5 (ref 5.0–8.0)

## 2016-06-26 LAB — PROTEIN, PLEURAL OR PERITONEAL FLUID

## 2016-06-26 LAB — GLUCOSE, CAPILLARY
GLUCOSE-CAPILLARY: 195 mg/dL — AB (ref 65–99)
GLUCOSE-CAPILLARY: 147 mg/dL — AB (ref 65–99)
Glucose-Capillary: 126 mg/dL — ABNORMAL HIGH (ref 65–99)
Glucose-Capillary: 201 mg/dL — ABNORMAL HIGH (ref 65–99)
Glucose-Capillary: 207 mg/dL — ABNORMAL HIGH (ref 65–99)

## 2016-06-26 LAB — GLUCOSE, PLEURAL OR PERITONEAL FLUID: GLUCOSE FL: 151 mg/dL

## 2016-06-26 LAB — CBC WITH DIFFERENTIAL/PLATELET
BASOS ABS: 0 10*3/uL (ref 0.0–0.1)
Basophils Relative: 0 %
EOS PCT: 3 %
Eosinophils Absolute: 0.2 10*3/uL (ref 0.0–0.7)
HCT: 40.9 % (ref 36.0–46.0)
Hemoglobin: 13.6 g/dL (ref 12.0–15.0)
LYMPHS PCT: 24 %
Lymphs Abs: 1.4 10*3/uL (ref 0.7–4.0)
MCH: 32.3 pg (ref 26.0–34.0)
MCHC: 33.3 g/dL (ref 30.0–36.0)
MCV: 97.1 fL (ref 78.0–100.0)
MONO ABS: 0.7 10*3/uL (ref 0.1–1.0)
Monocytes Relative: 12 %
Neutro Abs: 3.5 10*3/uL (ref 1.7–7.7)
Neutrophils Relative %: 61 %
PLATELETS: 240 10*3/uL (ref 150–400)
RBC: 4.21 MIL/uL (ref 3.87–5.11)
RDW: 14.8 % (ref 11.5–15.5)
WBC: 5.8 10*3/uL (ref 4.0–10.5)

## 2016-06-26 LAB — ALBUMIN, PLEURAL OR PERITONEAL FLUID: Albumin, Fluid: 1.2 g/dL

## 2016-06-26 LAB — PROTIME-INR
INR: 1.27
PROTHROMBIN TIME: 15.9 s — AB (ref 11.4–15.2)

## 2016-06-26 LAB — LACTATE DEHYDROGENASE, PLEURAL OR PERITONEAL FLUID: LD, Fluid: 51 U/L — ABNORMAL HIGH (ref 3–23)

## 2016-06-26 LAB — APTT: APTT: 38 s — AB (ref 24–36)

## 2016-06-26 MED ORDER — ENOXAPARIN SODIUM 30 MG/0.3ML ~~LOC~~ SOLN
30.0000 mg | SUBCUTANEOUS | Status: DC
  Administered 2016-06-27 – 2016-06-28 (×2): 30 mg via SUBCUTANEOUS
  Filled 2016-06-26 (×3): qty 0.3

## 2016-06-26 MED ORDER — LIDOCAINE HCL 1 % IJ SOLN
INTRAMUSCULAR | Status: AC
  Filled 2016-06-26: qty 20

## 2016-06-26 MED ORDER — FUROSEMIDE 10 MG/ML IJ SOLN
20.0000 mg | Freq: Once | INTRAMUSCULAR | Status: AC
  Administered 2016-06-26: 20 mg via INTRAVENOUS
  Filled 2016-06-26: qty 2

## 2016-06-26 MED ORDER — ALBUMIN HUMAN 25 % IV SOLN
100.0000 g | Freq: Once | INTRAVENOUS | Status: AC
  Administered 2016-06-27: 100 g via INTRAVENOUS
  Filled 2016-06-26 (×2): qty 400

## 2016-06-26 MED ORDER — METOPROLOL TARTRATE 5 MG/5ML IV SOLN
5.0000 mg | Freq: Once | INTRAVENOUS | Status: AC
  Administered 2016-06-26: 5 mg via INTRAVENOUS
  Filled 2016-06-26: qty 5

## 2016-06-26 MED ORDER — ALBUMIN HUMAN 25 % IV SOLN
12.5000 g | Freq: Once | INTRAVENOUS | Status: AC
  Administered 2016-06-26: 12.5 g via INTRAVENOUS
  Filled 2016-06-26: qty 50

## 2016-06-26 MED ORDER — INSULIN ASPART 100 UNIT/ML ~~LOC~~ SOLN: 0.0000 [IU] | Freq: Every day | SUBCUTANEOUS | Status: DC

## 2016-06-26 MED ORDER — LACTULOSE 10 GM/15ML PO SOLN
10.0000 g | Freq: Two times a day (BID) | ORAL | Status: DC
  Administered 2016-06-26 – 2016-06-28 (×6): 10 g via ORAL
  Filled 2016-06-26 (×6): qty 15

## 2016-06-26 MED ORDER — GLUCERNA SHAKE PO LIQD
237.0000 mL | Freq: Two times a day (BID) | ORAL | Status: DC
Start: 2016-06-26 — End: 2016-06-30
  Administered 2016-06-26 – 2016-06-29 (×3): 237 mL via ORAL

## 2016-06-26 MED ORDER — ALBUMIN HUMAN 25 % IV SOLN
100.0000 g | Freq: Once | INTRAVENOUS | Status: AC
  Administered 2016-06-26: 100 g via INTRAVENOUS
  Filled 2016-06-26 (×2): qty 400

## 2016-06-26 MED ORDER — INSULIN ASPART 100 UNIT/ML ~~LOC~~ SOLN
0.0000 [IU] | Freq: Three times a day (TID) | SUBCUTANEOUS | Status: DC
  Administered 2016-06-26: 2 [IU] via SUBCUTANEOUS
  Administered 2016-06-26: 3 [IU] via SUBCUTANEOUS
  Administered 2016-06-27: 2 [IU] via SUBCUTANEOUS
  Administered 2016-06-27: 3 [IU] via SUBCUTANEOUS
  Administered 2016-06-27 – 2016-06-28 (×2): 2 [IU] via SUBCUTANEOUS
  Administered 2016-06-28: 1 [IU] via SUBCUTANEOUS
  Administered 2016-06-29: 3 [IU] via SUBCUTANEOUS
  Administered 2016-06-29: 2 [IU] via SUBCUTANEOUS
  Administered 2016-06-29: 1 [IU] via SUBCUTANEOUS
  Administered 2016-06-30: 2 [IU] via SUBCUTANEOUS
  Administered 2016-06-30: 3 [IU] via SUBCUTANEOUS

## 2016-06-26 MED ORDER — ALBUMIN HUMAN 25 % IV SOLN
100.0000 g | Freq: Every day | INTRAVENOUS | Status: DC
  Filled 2016-06-26: qty 400

## 2016-06-26 MED ORDER — RIFAXIMIN 550 MG PO TABS
550.0000 mg | ORAL_TABLET | Freq: Two times a day (BID) | ORAL | Status: DC
  Administered 2016-06-26 – 2016-06-30 (×9): 550 mg via ORAL
  Filled 2016-06-26 (×9): qty 1

## 2016-06-26 NOTE — Progress Notes (Signed)
New Admission Note:  Arrival Method: Stretcher from ED Mental Orientation: A&O x4 Telemetry: [address] 23, CCMD notified Assessment: Completed Skin: Assessed with [place] W, intact IV: L FA Pain: 0/10 Tubes: N/A Safety Measures: Safety Fall Prevention Plan was given, discussed and signed by patient  Admission: Completed [address] Orientation: Patient has been orientated to the room, unit and the staff. Family: None at bedside  Orders have been reviewed and implemented. Will continue to monitor the patient. Call light has been placed within reach.  Nena Polio BSN, RN  Phone Number: [phone]

## 2016-06-26 NOTE — Progress Notes (Signed)
Initial Nutrition Assessment  DOCUMENTATION CODES:  Obesity unspecified  INTERVENTION:  Glucerna Shake po BID, each supplement provides 220 kcal and 10 grams of protein  Spent most of visit discussing medical nutrition therapy for cirrhotics.   Showed how to use always available menu  NUTRITION DIAGNOSIS:  Increased nutrient needs related to chronic illness (Cirrhosis) as evidenced by estimated nutritional requirements for this condition  GOAL:  Patient will meet greater than or equal to 90% of their needs  MONITOR:  PO intake, Supplement acceptance, Labs, Weight trends, I & O's  REASON FOR ASSESSMENT:  Malnutrition Screening Tool    ASSESSMENT:  71 y/o female with congential heart disease, atrial flutter, cryptogenic cirrhosis with ascites and gastric varices s/p banding. Admitted for ascites and underwent First paracentesis 3/26-3/28. Presented again with increased SOB, wheezing, abdominal distention. Worked up for acute renal failure. Admitted for decompensated liver disease.   Pt reports that she was first diagnosed with liver disease 6 years ago, however, it has largely not affected her up until the past couple months, at which time she started to experience ascites and had her first paracentesis. She says she had briefly been trying to lose weight, but soon found that "I was losing weight everywhere but my stomach and I know longer had to TRY to lose weight".   For the past couple months She notes symptoms consistent with liver disease such as early satiety, sense of fullness, decrease in appetite. These past 4 weeks specifically, she has only been able to eat a few bites at a meal before she is full. She denies n/v/c (takes liquid softener)  She did not want to give her UBW in front of her husband, but says she lost 10 lbs over the last 4 weeks, however her diuretics had been increased. Her weight is up over the last 6 months, likely due to fluid accumulation. She was 180 lbs  at this time last year  RD spent much of visit discussing medical nutrition therapy for cirrhotics. Discussed how her symptoms are very common for individuals with liver disease. She will need to eat small, frequent meals to help her maintain her weight. She will also need to eat more than she has in the past asenergy expenditure is much greater for this population. Stated optimal eating plan is 3 meals, 3 snacks, and a HS supplement.  She says her endocrinologist told her to go vegan. RD stated that the MD may have been speaking from a DM perspective or was using older standard of care that involved decreasing protein in liver disease. Explained that she exactly needs more kcals and protein, as such, a vegan diet is not recommended for her current state. She was glad to here this.   Stated she should watch her sodium and fluid intake to minimize her ascites. She says she watches her salt. Denies using salt shaker, eating frozen meals, or soups.    She has diabetes and has been working to decrease her BGs. Glucerna will be ordered. Also explained how to use always available menu.   Physical Exam: WDL, some abdominal distention  Lab: Creat 2.1 -> 1.89, Glu: 148-210 Medications: Insulin, Lactulose, Rifaximin, Albumin IV   Recent Labs Lab 06/25/16 1428 06/26/16 0640  NA 133* 136  K 5.1 4.1  CL 100* 102  CO2 21* 23  BUN 47* 43*  CREATININE 2.10* 1.89*  CALCIUM 9.7 9.2  GLUCOSE 159* 148*   Diet Order:  Diet 2 gram sodium Room service appropriate?  Yes; Fluid consistency: Thin; Fluid restriction: 1800 mL Fluid  Skin:  Reviewed, no issues  Last BM:  5/11  Height:  Ht Readings from Last 1 Encounters:  06/25/16 5\' 3"  (1.6 m)   Weight:  Wt Readings from Last 1 Encounters:  06/25/16 195 lb (88.5 kg)   Wt Readings from Last 10 Encounters:  06/25/16 195 lb (88.5 kg)  05/31/16 198 lb 6.4 oz (90 kg)  05/12/16 203 lb (92.1 kg)  04/26/16 203 lb (92.1 kg)  01/28/16 189 lb (85.7 kg)   12/17/15 187 lb (84.8 kg)  11/04/15 183 lb (83 kg)  08/08/15 181 lb (82.1 kg)  08/06/15 182 lb 12.8 oz (82.9 kg)  04/29/15 186 lb (84.4 kg)   Ideal Body Weight:  52.27 kg  BMI:  Body mass index is 34.54 kg/m.  Estimated Nutritional Needs:  Kcal:  1750-1900 kcals (1.2-1.3 g/kg HBE) Protein:  73-84 (1.4-1.6 g/kg ibw) Fluid:  Per MD  EDUCATION NEEDS:  Education needs addressed  Burtis Junes RD, LDN, CNSC Clinical Nutrition Pager: [phone] 06/26/2016 2:42 PM

## 2016-06-26 NOTE — Procedures (Signed)
  US guided RLQ paracentesis 2 Liter maximum per MD--milky fluid Sent for labs per MD  Tolerated well

## 2016-06-26 NOTE — Progress Notes (Signed)
Notified MD Carolin Sicks about pt's HR sustaining >150 and that pt had episodes of a flutter today.  Will continue to monitor   Paulla Fore, RN

## 2016-06-26 NOTE — Progress Notes (Addendum)
PROGRESS NOTE    Natalie Lane  AJO:[id number] DOB: [birth year] DOA: 06/25/2016 PCP: Carol Ada, MD   Brief Narrative: 71 year old female with history of paroxysmal atrial tachycardia, cryptogenic cirrhosis with ascites and gastric varices status post banding by Dr. Michail Sermon, recently increased the dose of diuretics, had a paracentesis in the past presented with generalized abdominal pain. In the ER patient was found to have tachycardia, history of present illness with elevated serum creatinine level of 2.1 and admitted for further evaluation.  Assessment & Plan:  # Decompensated liver cirrhosis with ascites and history of gastric varices: -Patient is already went for abdominal paracentesis with 2 L of fluid removal. The fluid is reported as milky. Follow-up the fluid study results. Diuretics on hold because of kidney failure. Currently on albumin. I discussed with Dr. Alessandra Bevels from GI for the consult. Patient reported that her abdominal pain is better today. Denied nausea vomiting. -Resume lactulose and Xifaxan and propranolol. -Patient has no fever, no leukocytosis and abdomen pain is improved. Holding off antibiotics.  #Acute kidney injury likely hemodynamically mediated in the setting of diuretics,  liver disease and use of Motrin. Serum creatinine level mildly improved with IV fluid. Continue to hold diuretics. Discontinue IV fluids since patient has good oral intake. Patient denied urinary symptoms. Repeat BMP in the morning. -Education provided to the patient to avoid NSAIDs.  #Hypotension: Improved. Monitor now.  #Paroxysmal atrial tachycardia: Resume  propranolol. Her heart rate.  #Type 2 diabetes: Continue sliding scale. Monitor blood sugar level.  #Hypoalbuminemia in the setting of liver disease. Currently on IV albumin as per GI.  Principal Problem:   Decompensated hepatic cirrhosis (HCC) Active Problems:   Ascites   PAT (paroxysmal atrial tachycardia) (HCC)   ARF  (acute renal failure) (HCC)   Tobacco use  DVT prophylaxis: Lovenox subcutaneous Code Status: Full code Family Communication: Discussed with the patient's son at bedside Disposition Plan: Likely discharge home in 1-2 days    Consultants:   GI  Procedures: Ultrasound paracentesis Antimicrobials: None  Subjective: Seen and examined at bedside. Reported abdomen pain is better after paracentesis. Denied fever, chills, nausea, vomiting, diarrhea. Son at bedside. No chest pain or shortness of breath.  Objective: Vitals:   06/26/16 0820 06/26/16 0856 06/26/16 0905 06/26/16 0951  BP: 105/69 99/63 102/74 111/61  Pulse:    93  Resp:      Temp:    97.7 F (36.5 C)  TempSrc:    Oral  SpO2:    97%  Weight:      Height:        Intake/Output Summary (Last 24 hours) at 06/26/16 1223 Last data filed at 06/26/16 1211  Gross per 24 hour  Intake          1306.25 ml  Output              850 ml  Net           456.25 ml   Filed Weights   06/25/16 2346  Weight: 88.5 kg (195 lb)    Examination:  General exam: Appears calm and comfortable  Respiratory system: Clear to auscultation. Respiratory effort normal. No wheezing or crackle Cardiovascular system: S1 & S2 heard, RRR.  Trace pedal edema. Gastrointestinal system: Abdomen is distended, soft and nontender. Normal bowel sounds heard. Central nervous system: Alert and oriented. No focal neurological deficits. Extremities: Symmetric 5 x 5 power. Skin: No rashes, lesions or ulcers Psychiatry: Judgement and insight appear normal. Mood & affect appropriate.  Data Reviewed: I have personally reviewed following labs and imaging studies  CBC:  Recent Labs Lab 06/25/16 1428 06/26/16 0640  WBC 8.1 5.8  NEUTROABS  --  3.5  HGB 14.1 13.6  HCT 41.6 40.9  MCV 95.9 97.1  PLT 272 161   Basic Metabolic Panel:  Recent Labs Lab 06/25/16 1428 06/26/16 0640  NA 133* 136  K 5.1 4.1  CL 100* 102  CO2 21* 23  GLUCOSE 159* 148*    BUN 47* 43*  CREATININE 2.10* 1.89*  CALCIUM 9.7 9.2   GFR: Estimated Creatinine Clearance: 29.6 mL/min (A) (by C-G formula based on SCr of 1.89 mg/dL (H)). Liver Function Tests:  Recent Labs Lab 06/25/16 1658  AST 27  ALT 18  ALKPHOS 45  BILITOT 1.1  PROT 7.2  ALBUMIN 3.2*   No results for input(s): LIPASE, AMYLASE in the last 168 hours. No results for input(s): AMMONIA in the last 168 hours. Coagulation Profile:  Recent Labs Lab 06/26/16 0006  INR 1.27   Cardiac Enzymes: No results for input(s): CKTOTAL, CKMB, CKMBINDEX, TROPONINI in the last 168 hours. BNP (last 3 results) No results for input(s): PROBNP in the last 8760 hours. HbA1C: No results for input(s): HGBA1C in the last 72 hours. CBG:  Recent Labs Lab 06/26/16 0030 06/26/16 0950 06/26/16 1203  GLUCAP 207* 126* 201*   Lipid Profile: No results for input(s): CHOL, HDL, LDLCALC, TRIG, CHOLHDL, LDLDIRECT in the last 72 hours. Thyroid Function Tests: No results for input(s): TSH, T4TOTAL, FREET4, T3FREE, THYROIDAB in the last 72 hours. Anemia Panel: No results for input(s): VITAMINB12, FOLATE, FERRITIN, TIBC, IRON, RETICCTPCT in the last 72 hours. Sepsis Labs: No results for input(s): PROCALCITON, LATICACIDVEN in the last 168 hours.  No results found for this or any previous visit (from the past 240 hour(s)).       Radiology Studies: Ct Abdomen Pelvis Wo Contrast  Result Date: 06/25/2016 CLINICAL DATA:  Ascites, abdominal distention for 1 month. Right lower quadrant abdominal pain. EXAM: CT ABDOMEN AND PELVIS WITHOUT CONTRAST TECHNIQUE: Multidetector CT imaging of the abdomen and pelvis was performed following the standard protocol without IV contrast. COMPARISON:  Most recent CT 07/02/2013 FINDINGS: Lower chest: Linear atelectasis in the lung bases, right greater than left. No pleural fluid. Normal heart size with left atrial enlargement. There are coronary artery calcifications. Hepatobiliary:  Cirrhotic hepatic morphology with nodular contours. No discrete focal lesion on noncontrast exam. Clips in the gallbladder fossa postcholecystectomy. No biliary dilatation. Pancreas: No ductal dilatation or inflammation. Punctate calcification in the uncinate process is unchanged. Spleen: Normal in size without focal abnormality.  No splenomegaly. Adrenals/Urinary Tract: Normal adrenal glands. No hydronephrosis or perinephric edema. No urolithiasis. Urinary bladder is physiologically distended without wall thickening. Stomach/Bowel: Limited assessment secondary to lack of oral contrast on presence of intra-abdominal ascites. No evidence of obstruction. Colonic diverticulosis without inflammation. The appendix is not visualized. Small bowel is decompressed. Stomach is minimally distended. Probable perigastric varices. Vascular/Lymphatic: Aortic and branch atherosclerosis without aneurysm. Limited assessment for adenopathy given lack of contrast and presence of intra-abdominal ascites, no gross adenopathy is seen. Reproductive: Post hysterectomy.  No evidence of adnexal mass. Other: Large volume of abdominopelvic ascites. Fluid measures simple density. No free air. There is associated mesenteric edema. Mild body wall edema anteriorly. Musculoskeletal: Unchanged bone island in the right acetabulum. There are no acute or suspicious osseous abnormalities. Degenerative disc disease and facet arthropathy in the lumbar spine. IMPRESSION: 1. Cirrhotic liver with large volume abdominopelvic  ascites. 2. No definite acute abnormality apparent 3. Aortic and branch atherosclerosis. Electronically Signed   By: Jeb Levering M.D.   On: 06/25/2016 17:53   Dg Chest 2 View  Result Date: 06/25/2016 CLINICAL DATA:  Chronic shortness of breath. EXAM: CHEST  2 VIEW COMPARISON:  Radiographs of May 10, 2016. FINDINGS: Stable cardiomediastinal silhouette. Sternotomy wires are noted. Hypoinflation of the lungs is noted. Mild  bibasilar subsegmental atelectasis is noted. No pneumothorax or pleural effusion is noted. Bony thorax is unremarkable. IMPRESSION: Hypoinflation of the lungs. Mild bibasilar subsegmental atelectasis. Electronically Signed   By: Marijo Conception, M.D.   On: 06/25/2016 15:36   US Paracentesis  Result Date: 06/26/2016 INDICATION: ascites EXAM: ULTRASOUND-GUIDED PARACENTESIS COMPARISON:  Previous paracentesis MEDICATIONS: 10 cc 1% lidocaine. COMPLICATIONS: None immediate. TECHNIQUE: Informed written consent was obtained from the patient after a discussion of the risks, benefits and alternatives to treatment. A timeout was performed prior to the initiation of the procedure. Initial ultrasound scanning demonstrates a large amount of ascites within the right lower abdominal quadrant. The right lower abdomen was prepped and draped in the usual sterile fashion. 1% lidocaine with epinephrine was used for local anesthesia. Under direct ultrasound guidance, a 19 gauge, 7-cm, Yueh catheter was introduced. An ultrasound image was saved for documentation purposed. The paracentesis was performed. The catheter was removed and a dressing was applied. The patient tolerated the procedure well without immediate post procedural complication. FINDINGS: A total of approximately 2 liters maximum per MD of milky fluid was removed. Samples were sent to the laboratory as requested by the clinical team. IMPRESSION: Successful ultrasound-guided paracentesis yielding 2 liters maximum of peritoneal fluid. Read by Lavonia Drafts [facility] Electronically Signed   By: Jacqulynn Cadet M.D.   On: 06/26/2016 09:24        Scheduled Meds: . enoxaparin (LOVENOX) injection  30 mg Subcutaneous Q24H  . insulin aspart  0-5 Units Subcutaneous QHS  . insulin aspart  0-9 Units Subcutaneous TID WC  . ipratropium-albuterol  3 mL Nebulization STAT  . lactulose  10 g Oral BID  . lidocaine      . rifaximin  550 mg Oral BID   Continuous Infusions: .  albumin human    . [START ON 06/27/2016] albumin human       LOS: 0 days    Dron Tanna Furry, MD [facility] Pager [phone]  If 7PM-7AM, please contact night-coverage [email or web address] Password [facility] 06/26/2016, 12:23 PM

## 2016-06-26 NOTE — Progress Notes (Signed)
CCMD notified RN that patient's heart rate is sustaining in the 140-150's. Patient has a history of A. Flutter. RN notified on call NP, Lynch. Advised to continue to monitor patient for tonight. RN notified CCMD to change HR parameters.   RN will continue to monitor patient.  Ermalinda Memos, RN

## 2016-06-26 NOTE — Consult Note (Signed)
Referring Provider:  TH/ Dr. Carolin Sicks Primary Care Physician:  Carol Ada, MD Primary Gastroenterologist:  Dr. Michail Sermon  Reason for Consultation:  Decompensated cirrhosis, refractory ascites  HPI: Natalie Lane is a 71 y.o. female with past medical history significant for cryptogenic cirrhosis with refractory ascites and history of variceal bleeding status post band ligation admitted to the hospital with abdominal distention, right lower quadrant pain and difficulty breathing. Patient is followed by Dr. Michail Sermon as an outpatient basis. According to patient she started noticing worsening shortness of breath since last 1-2 weeks and then started noticing bright lower quadrant/right flank pain. Because of pain and difficulty breathing she decided to come to the hospital. It appears that patient been taking 60 mg of Lasix and 150 mg of Aldactone based on description. Patient denied any blood in the stool or black stool. Denied any fever or chills. Denied any confusion. Patient was found to have acute kidney injury on admission. Underwent paracentesis yesterday with 2 L fluid removal. Patient is being feeling better since paracentesis. Abdominal pain is resolved. Shortness of breath is improving.  Past Medical History:  Diagnosis Date  . Anemia   . Atrial flutter ([place])   . 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   . CHF (congestive heart failure) ([place])   . Cirrhosis ([place])   . Complication of anesthesia   . Diabetes mellitus, type 2 ([place])   . Dyspnea   . Dysrhythmia    AFlutter  . Edema   . GERD (gastroesophageal reflux disease)   . History of blood transfusion   . Hyperlipidemia   . Normal stress echocardiogram 2008  . PONV (postoperative nausea and vomiting)    with Cholecystectomy    Past Surgical History:  Procedure Laterality Date  . ABDOMINAL HYSTERECTOMY    . ASD versus VSD repair  1978   Operative notes not  available.  . ATRIAL ABLATION SURGERY  10 /2011  . [place]   x2  . CHOLECYSTECTOMY    . CORONARY ARTERY BYPASS GRAFT  1978  . ESOPHAGOGASTRODUODENOSCOPY (EGD) WITH PROPOFOL N/A 11/16/2013   Procedure: ESOPHAGOGASTRODUODENOSCOPY (EGD) WITH PROPOFOL;  Surgeon: Lear Ng, MD;  Location: [place];  Service: Endoscopy;  Laterality: N/A;  . ESOPHAGOGASTRODUODENOSCOPY (EGD) WITH PROPOFOL N/A 01/23/2014   Procedure: ESOPHAGOGASTRODUODENOSCOPY (EGD) WITH PROPOFOL;  Surgeon: Lear Ng, MD;  Location: [place];  Service: Endoscopy;  Laterality: N/A;  . GASTRIC VARICES BANDING N/A 11/16/2013   Procedure: GASTRIC VARICES BANDING;  Surgeon: Lear Ng, MD;  Location: [place];  Service: Endoscopy;  Laterality: N/A;  . GASTRIC VARICES BANDING N/A 01/23/2014   Procedure: GASTRIC VARICES BANDING;  Surgeon: Lear Ng, MD;  Location: [place];  Service: Endoscopy;  Laterality: N/A;  . HERNIA REPAIR    . IR GENERIC HISTORICAL  05/11/2016   IR PARACENTESIS 05/11/2016 Ascencion Dike, PA-C [facility]INTER[facility] RAD  . OTHER SURGICAL HISTORY     partial hysterectomy    Prior to Admission medications   Medication Sig Start Date End Date Taking? Authorizing Provider  diphenhydrAMINE (BENADRYL) 25 MG tablet Take 25 mg by mouth every 6 (six) hours as needed for allergies.   Yes [provider]  docusate sodium (COLACE) 100 MG capsule Take 100 mg by mouth 2 (two) times daily as needed for mild constipation.   Yes [provider]  furosemide (LASIX) 40 MG tablet Take 1 tablet (40 mg total)  by mouth daily. Patient taking differently: Take 60 mg by mouth daily.  05/12/16  Yes Verlee Monte, MD  glimepiride (AMARYL) 2 MG tablet Take 1 tablet (2 mg total) by mouth 2 (two) times daily before a meal. Patient taking differently: Take 2 mg by mouth 2 (two) times daily.  02/26/16  Yes Philemon Kingdom, MD  ibuprofen (ADVIL,MOTRIN) 200 MG tablet Take 200 mg by  mouth every 6 (six) hours as needed for headache, mild pain or moderate pain.    Yes [provider]  insulin NPH Human (HUMULIN N,NOVOLIN N) 100 UNIT/ML injection ReliOn brand - Pt uses 25 units in the morning and 15 units at bedtime. Patient taking differently: Inject 15-25 Units into the skin. ReliOn brand - Pt uses 25 units in the morning and 15 units at bedtime. 05/31/16  Yes Philemon Kingdom, MD  Insulin Syringe-Needle U-100 (RELION INSULIN SYRINGE) 31G X 15/64" 0.3 ML MISC Use to inject insulin two times daily 06/14/16  Yes Philemon Kingdom, MD  lactulose (CHRONULAC) 10 GM/15ML solution Take 10 g by mouth 2 (two) times daily.    Yes [provider]  loperamide (IMODIUM A-D) 2 MG tablet Take 2 mg by mouth 4 (four) times daily as needed for diarrhea or loose stools.   Yes [provider]  metFORMIN (GLUCOPHAGE-XR) 500 MG 24 hr tablet Take 1 tablet (500 mg total) by mouth 2 (two) times daily with a meal. 06/25/16  Yes Philemon Kingdom, MD  propranolol (INDERAL) 20 MG tablet Take 1 tablet (20 mg total) by mouth 2 (two) times daily. 08/06/15  Yes Burtis Junes, NP  ranitidine (ZANTAC) 150 MG capsule Take 150 mg by mouth daily as needed for heartburn.   Yes [provider]  simethicone (MYLICON) 80 MG chewable tablet Chew 80 mg by mouth 4 (four) times daily as needed for flatulence.   Yes [provider]  spironolactone (ALDACTONE) 100 MG tablet Take 1 tablet (100 mg total) by mouth daily. Patient taking differently: Take 150 mg by mouth daily.  05/12/16  Yes Verlee Monte, MD  rifaximin (XIFAXAN) 550 MG TABS tablet Take 550 mg by mouth 2 (two) times daily.    [provider]    Scheduled Meds: . enoxaparin (LOVENOX) injection  30 mg Subcutaneous Q24H  . insulin aspart  0-5 Units Subcutaneous QHS  . insulin aspart  0-9 Units Subcutaneous TID WC  . ipratropium-albuterol  3 mL Nebulization STAT  . lactulose  10 g Oral BID  . lidocaine      .  rifaximin  550 mg Oral BID   Continuous Infusions: . sodium chloride 75 mL/hr at 06/25/16 2347   PRN Meds:.ipratropium-albuterol  Allergies as of 06/25/2016 - Review Complete 06/25/2016  Allergen Reaction Noted  . Monosodium glutamate Anaphylaxis 05/10/2016  . Red dye Anaphylaxis 06/25/2010  . Erythromycin Other (See Comments) 06/25/2010  . Penicillins Other (See Comments) 06/25/2010  . Statins Other (See Comments) 01/23/2014  . Sulfa antibiotics Hives 08/30/2010  . Glipizide Rash 09/19/2014  . Iohexol Rash 09/01/2011  . Spironolactone Palpitations and Other (See Comments) 05/10/2016    Family History  Problem Relation Age of Onset  . Hypertension Mother   . Kidney disease Sister   . Depression Paternal Grandfather   . Diabetes Brother     Social History   Social History  . Marital status: Married    Spouse name: N/A  . Number of children: 3  . Years of education: N/A   Occupational History  .  Office management [employer]   Social History Main Topics  . Smoking status: Former Smoker    Packs/day: 0.50    Years: 36.00    Types: Cigarettes  . Smokeless tobacco: Never Used  . Alcohol use No  . Drug use: No  . Sexual activity: Not on file   Other Topics Concern  . Not on file   Social History Narrative  . No narrative on file    Review of Systems:  Review of Systems  Constitutional: Negative for chills and fever.  HENT: Negative for ear discharge, ear pain, hearing loss and tinnitus.   Eyes: Negative for blurred vision and double vision.  Respiratory: Positive for shortness of breath. Negative for cough and hemoptysis.   Cardiovascular: Negative for chest pain and palpitations.  Gastrointestinal: Positive for abdominal pain. Negative for blood in stool, heartburn, melena, nausea and vomiting.  Genitourinary: Negative for dysuria and hematuria.  Musculoskeletal: Positive for back pain. Negative for neck pain.  Skin: Negative for itching and rash.   Neurological: Positive for speech change and weakness. Negative for dizziness, focal weakness and headaches.  Endo/Heme/Allergies: Bruises/bleeds easily.  Psychiatric/Behavioral: Negative for hallucinations and suicidal ideas.    Physical Exam: Vital signs: Vitals:   06/26/16 0905 06/26/16 0951  BP: 102/74 111/61  Pulse:  93  Resp:    Temp:  97.7 F (36.5 C)   Last BM Date: 06/25/16 General:   Alert,  Well-developed, well-nourished, pleasant and cooperative in NAD HEENT : Normocephalic, atraumatic, extraocular movement intact. Oropharynx clear and moist. No scleral icterus. Lungs:  Fine basilar crackles noted. No respiratory distress. Heart:  Regular rate and rhythm; no murmurs, clicks, rubs,  or gallops. Abdomen: Abdomen remains distended without any tenderness. Bowel sounds present. No peritoneal signs. Difficult to appreciate hepatosplenomegaly given distended abdomen with ascites. LE : trace edema   GI:  Lab Results:  Recent Labs  06/25/16 1428 06/26/16 0640  WBC 8.1 5.8  HGB 14.1 13.6  HCT 41.6 40.9  PLT 272 240   BMET  Recent Labs  06/25/16 1428 06/26/16 0640  NA 133* 136  K 5.1 4.1  CL 100* 102  CO2 21* 23  GLUCOSE 159* 148*  BUN 47* 43*  CREATININE 2.10* 1.89*  CALCIUM 9.7 9.2   LFT  Recent Labs  06/25/16 1658  PROT 7.2  ALBUMIN 3.2*  AST 27  ALT 18  ALKPHOS 45  BILITOT 1.1  BILIDIR 0.3  IBILI 0.8   PT/INR  Recent Labs  06/26/16 0006  LABPROT 15.9*  INR 1.27     Studies/Results: Ct Abdomen Pelvis Wo Contrast  Result Date: 06/25/2016 CLINICAL DATA:  Ascites, abdominal distention for 1 month. Right lower quadrant abdominal pain. EXAM: CT ABDOMEN AND PELVIS WITHOUT CONTRAST TECHNIQUE: Multidetector CT imaging of the abdomen and pelvis was performed following the standard protocol without IV contrast. COMPARISON:  Most recent CT 07/02/2013 FINDINGS: Lower chest: Linear atelectasis in the lung bases, right greater than left. No pleural  fluid. Normal heart size with left atrial enlargement. There are coronary artery calcifications. Hepatobiliary: Cirrhotic hepatic morphology with nodular contours. No discrete focal lesion on noncontrast exam. Clips in the gallbladder fossa postcholecystectomy. No biliary dilatation. Pancreas: No ductal dilatation or inflammation. Punctate calcification in the uncinate process is unchanged. Spleen: Normal in size without focal abnormality.  No splenomegaly. Adrenals/Urinary Tract: Normal adrenal glands. No hydronephrosis or perinephric edema. No urolithiasis. Urinary bladder is physiologically distended without wall thickening. Stomach/Bowel: Limited assessment secondary to lack of oral  contrast on presence of intra-abdominal ascites. No evidence of obstruction. Colonic diverticulosis without inflammation. The appendix is not visualized. Small bowel is decompressed. Stomach is minimally distended. Probable perigastric varices. Vascular/Lymphatic: Aortic and branch atherosclerosis without aneurysm. Limited assessment for adenopathy given lack of contrast and presence of intra-abdominal ascites, no gross adenopathy is seen. Reproductive: Post hysterectomy.  No evidence of adnexal mass. Other: Large volume of abdominopelvic ascites. Fluid measures simple density. No free air. There is associated mesenteric edema. Mild body wall edema anteriorly. Musculoskeletal: Unchanged bone island in the right acetabulum. There are no acute or suspicious osseous abnormalities. Degenerative disc disease and facet arthropathy in the lumbar spine. IMPRESSION: 1. Cirrhotic liver with large volume abdominopelvic ascites. 2. No definite acute abnormality apparent 3. Aortic and branch atherosclerosis. Electronically Signed   By: Jeb Levering M.D.   On: 06/25/2016 17:53   Dg Chest 2 View  Result Date: 06/25/2016 CLINICAL DATA:  Chronic shortness of breath. EXAM: CHEST  2 VIEW COMPARISON:  Radiographs of May 10, 2016. FINDINGS:  Stable cardiomediastinal silhouette. Sternotomy wires are noted. Hypoinflation of the lungs is noted. Mild bibasilar subsegmental atelectasis is noted. No pneumothorax or pleural effusion is noted. Bony thorax is unremarkable. IMPRESSION: Hypoinflation of the lungs. Mild bibasilar subsegmental atelectasis. Electronically Signed   By: Marijo Conception, M.D.   On: 06/25/2016 15:36   US Paracentesis  Result Date: 06/26/2016 INDICATION: ascites EXAM: ULTRASOUND-GUIDED PARACENTESIS COMPARISON:  Previous paracentesis MEDICATIONS: 10 cc 1% lidocaine. COMPLICATIONS: None immediate. TECHNIQUE: Informed written consent was obtained from the patient after a discussion of the risks, benefits and alternatives to treatment. A timeout was performed prior to the initiation of the procedure. Initial ultrasound scanning demonstrates a large amount of ascites within the right lower abdominal quadrant. The right lower abdomen was prepped and draped in the usual sterile fashion. 1% lidocaine with epinephrine was used for local anesthesia. Under direct ultrasound guidance, a 19 gauge, 7-cm, Yueh catheter was introduced. An ultrasound image was saved for documentation purposed. The paracentesis was performed. The catheter was removed and a dressing was applied. The patient tolerated the procedure well without immediate post procedural complication. FINDINGS: A total of approximately 2 liters maximum per MD of milky fluid was removed. Samples were sent to the laboratory as requested by the clinical team. IMPRESSION: Successful ultrasound-guided paracentesis yielding 2 liters maximum of peritoneal fluid. Read by Lavonia Drafts [facility] Electronically Signed   By: Jacqulynn Cadet M.D.   On: 06/26/2016 09:24    Impression/Plan: - Refractory ascites. Status post paracentesis with removal of 2 L of fluid today. - Acute kidney injury. Most likely from diuretics. - Decompensated cirrhosis with refractory ascites and history of variceal  bleeding as well as encephalopathy. Normal LFTs. Platelets and INR  Recommendations --------------------------- - Follow fluid analysis. Patient with normal CBC. No signs of infection. Hold off on antibiotics for now. - Hold diuretics. Will give trial of albumin 100 g for 2 days. Change diet to low sodium diet. - Monitor kidney function. GI will follow.    LOS: 0 days   Otis Brace  MD, FACP 06/26/2016, 11:22 AM  Pager [phone] If no answer or after 5 PM call [phone]

## 2016-06-27 LAB — COMPREHENSIVE METABOLIC PANEL
ALBUMIN: 4.7 g/dL (ref 3.5–5.0)
ALK PHOS: 29 U/L — AB (ref 38–126)
ALT: 12 U/L — AB (ref 14–54)
AST: 22 U/L (ref 15–41)
Anion gap: 12 (ref 5–15)
BUN: 35 mg/dL — AB (ref 6–20)
CALCIUM: 9.4 mg/dL (ref 8.9–10.3)
CO2: 21 mmol/L — AB (ref 22–32)
CREATININE: 1.54 mg/dL — AB (ref 0.44–1.00)
Chloride: 103 mmol/L (ref 101–111)
GFR calc Af Amer: 39 mL/min — ABNORMAL LOW (ref >60)
GFR calc non Af Amer: 33 mL/min — ABNORMAL LOW (ref >60)
GLUCOSE: 110 mg/dL — AB (ref 65–99)
Potassium: 4.1 mmol/L (ref 3.5–5.1)
SODIUM: 136 mmol/L (ref 135–145)
Total Bilirubin: 1.8 mg/dL — ABNORMAL HIGH (ref 0.3–1.2)
Total Protein: 6.4 g/dL — ABNORMAL LOW (ref 6.5–8.1)

## 2016-06-27 LAB — PH, BODY FLUID: PH, BODY FLUID: 7.7

## 2016-06-27 LAB — GLUCOSE, CAPILLARY
GLUCOSE-CAPILLARY: 218 mg/dL — AB (ref 65–99)
Glucose-Capillary: 154 mg/dL — ABNORMAL HIGH (ref 65–99)
Glucose-Capillary: 249 mg/dL — ABNORMAL HIGH (ref 65–99)
Glucose-Capillary: 165 mg/dL — ABNORMAL HIGH (ref 65–99)

## 2016-06-27 MED ORDER — OXYCODONE HCL 5 MG PO TABS
5.0000 mg | ORAL_TABLET | ORAL | Status: DC | PRN
  Administered 2016-06-27 – 2016-06-29 (×2): 5 mg via ORAL
  Filled 2016-06-27 (×3): qty 1

## 2016-06-27 MED ORDER — METOPROLOL TARTRATE 5 MG/5ML IV SOLN
5.0000 mg | INTRAVENOUS | Status: DC | PRN
  Administered 2016-06-28 (×2): 5 mg via INTRAVENOUS
  Filled 2016-06-27 (×3): qty 5

## 2016-06-27 MED ORDER — METOPROLOL TARTRATE 12.5 MG HALF TABLET
12.5000 mg | ORAL_TABLET | Freq: Two times a day (BID) | ORAL | Status: DC
  Administered 2016-06-27 – 2016-06-30 (×6): 12.5 mg via ORAL
  Filled 2016-06-27 (×7): qty 1

## 2016-06-27 NOTE — Progress Notes (Signed)
PROGRESS NOTE    Natalie Lane  YQI:[id number] DOB: [birth year] DOA: 06/25/2016 PCP: Carol Ada, MD   Brief Narrative: 71 year old female with history of paroxysmal atrial tachycardia, cryptogenic cirrhosis with ascites and gastric varices status post banding by Dr. Michail Sermon, recently increased the dose of diuretics, had a paracentesis in the past presented with generalized abdominal pain. In the ER patient was found to have tachycardia, history of present illness with elevated serum creatinine level of 2.1 and admitted for further evaluation.  Assessment & Plan:  # Decompensated liver cirrhosis with ascites and history of gastric varices: -Status post ultrasound-guided paracentesis with removal of withdrawal fluid. Fluid is studies with no infection. Diuretics currently on hold because of kidney failure. GI consult appreciated. Complaining of abdominal pain this morning therefore order low-dose oxycodone. Continue to monitor clinically. -Continue lactulose and Xifaxan and propranolol. -Patient has no fever, no leukocytosis .   #Acute kidney injury likely hemodynamically mediated in the setting of diuretics,  liver disease and use of Motrin. Serum creatinine level improving. Continue to hold diuretics. Monitor labs. -Education provided to the patient to avoid NSAIDs.  #Hypotension: Continue to monitor closely.  #Paroxysmal atrial tachycardia ; episodes of tachycardia last night and this morning. Holding propranolol because of hypotension. He started selective beta blocker with metoprolol. Continue heart rate and telemetry monitoring  #Type 2 diabetes: Continue sliding scale. Monitor blood sugar level.  #Hypoalbuminemia in the setting of liver disease. Currently on IV albumin as per GI.  labs improving  Principal Problem:   Decompensated hepatic cirrhosis (HCC) Active Problems:   Ascites   PAT (paroxysmal atrial tachycardia) (HCC)   ARF (acute renal failure) (HCC)   Tobacco use   AKI (acute kidney injury) ([place])   Decompensated liver disease (HCC)  DVT prophylaxis: Lovenox subcutaneous Code Status: Full code Family Communication: Discussed with the patient's son at bedside Disposition Plan: Likely discharge home in 1-2 days    Consultants:   GI  Procedures: Ultrasound paracentesis Antimicrobials: None  Subjective: Seen and examined at bedside. Reports  abdominal discomfort this morning. No nausea vomiting, chest pain or shortness of breath. Found to have tachycardia  Objective: Vitals:   06/26/16 2053 06/27/16 0550 06/27/16 1000 06/27/16 1021  BP: (!) 91/45 (!) 91/35 108/84 96/64  Pulse: 92 97 91 (!) 112  Resp: 20 17 16 16   Temp: 98.3 F (36.8 C) 98.6 F (37 C) 97.8 F (36.6 C) 97.5 F (36.4 C)  TempSrc: Oral Oral Oral Oral  SpO2:  95% 96% 96%  Weight: 87.1 kg (192 lb 1.6 oz)     Height:        Intake/Output Summary (Last 24 hours) at 06/27/16 1228 Last data filed at 06/27/16 1021  Gross per 24 hour  Intake              560 ml  Output              750 ml  Net             -190 ml   Filed Weights   06/25/16 2346 06/26/16 2053  Weight: 88.5 kg (195 lb) 87.1 kg (192 lb 1.6 oz)    Examination:  General exam: Sitting on chair comfortably, not in distress Respiratory system: Clear bilateral. Respiratory effort normal. No wheezing or crackle Cardiovascular system: Regular rate rhythm, tachycardic. S1-S2 normal. Trace pedal edema. Gastrointestinal system: Abdomen soft, nontender but distended which is stable. Normal bowel sound Central nervous system: Alert and oriented. No focal  neurological deficits. Extremities: Symmetric 5 x 5 power. Skin: No rashes, lesions or ulcers Psychiatry: Judgement and insight appear normal. Mood & affect appropriate.     Data Reviewed: I have personally reviewed following labs and imaging studies  CBC:  Recent Labs Lab 06/25/16 1428 06/26/16 0640  WBC 8.1 5.8  NEUTROABS  --  3.5  HGB 14.1 13.6  HCT  41.6 40.9  MCV 95.9 97.1  PLT 272 742   Basic Metabolic Panel:  Recent Labs Lab 06/25/16 1428 06/26/16 0640 06/27/16 0513  NA 133* 136 136  K 5.1 4.1 4.1  CL 100* 102 103  CO2 21* 23 21*  GLUCOSE 159* 148* 110*  BUN 47* 43* 35*  CREATININE 2.10* 1.89* 1.54*  CALCIUM 9.7 9.2 9.4   GFR: Estimated Creatinine Clearance: 36.1 mL/min (A) (by C-G formula based on SCr of 1.54 mg/dL (H)). Liver Function Tests:  Recent Labs Lab 06/25/16 1658 06/27/16 0513  AST 27 22  ALT 18 12*  ALKPHOS 45 29*  BILITOT 1.1 1.8*  PROT 7.2 6.4*  ALBUMIN 3.2* 4.7   No results for input(s): LIPASE, AMYLASE in the last 168 hours. No results for input(s): AMMONIA in the last 168 hours. Coagulation Profile:  Recent Labs Lab 06/26/16 0006  INR 1.27   Cardiac Enzymes: No results for input(s): CKTOTAL, CKMB, CKMBINDEX, TROPONINI in the last 168 hours. BNP (last 3 results) No results for input(s): PROBNP in the last 8760 hours. HbA1C: No results for input(s): HGBA1C in the last 72 hours. CBG:  Recent Labs Lab 06/26/16 1203 06/26/16 1741 06/26/16 2050 06/27/16 0821 06/27/16 1153  GLUCAP 201* 195* 147* 154* 249*   Lipid Profile: No results for input(s): CHOL, HDL, LDLCALC, TRIG, CHOLHDL, LDLDIRECT in the last 72 hours. Thyroid Function Tests: No results for input(s): TSH, T4TOTAL, FREET4, T3FREE, THYROIDAB in the last 72 hours. Anemia Panel: No results for input(s): VITAMINB12, FOLATE, FERRITIN, TIBC, IRON, RETICCTPCT in the last 72 hours. Sepsis Labs: No results for input(s): PROCALCITON, LATICACIDVEN in the last 168 hours.  Recent Results (from the past 240 hour(s))  Anaerobic culture     Status: None (Preliminary result)   Collection Time: 06/26/16  9:22 AM  Result Value Ref Range Status   Specimen Description FLUID PERITONEAL  Final   Special Requests NONE  Final   Culture   Final    NO ANAEROBES ISOLATED; CULTURE IN PROGRESS FOR 5 DAYS   Report Status PENDING  Incomplete    Body fluid culture     Status: None (Preliminary result)   Collection Time: 06/26/16  9:22 AM  Result Value Ref Range Status   Specimen Description FLUID PERITONEAL  Final   Special Requests NONE  Final   Gram Stain   Final    MODERATE WBC PRESENT, PREDOMINANTLY MONONUCLEAR NO ORGANISMS SEEN    Culture NO GROWTH 1 DAY  Final   Report Status PENDING  Incomplete         Radiology Studies: Ct Abdomen Pelvis Wo Contrast  Result Date: 06/25/2016 CLINICAL DATA:  Ascites, abdominal distention for 1 month. Right lower quadrant abdominal pain. EXAM: CT ABDOMEN AND PELVIS WITHOUT CONTRAST TECHNIQUE: Multidetector CT imaging of the abdomen and pelvis was performed following the standard protocol without IV contrast. COMPARISON:  Most recent CT 07/02/2013 FINDINGS: Lower chest: Linear atelectasis in the lung bases, right greater than left. No pleural fluid. Normal heart size with left atrial enlargement. There are coronary artery calcifications. Hepatobiliary: Cirrhotic hepatic morphology with nodular contours. No  discrete focal lesion on noncontrast exam. Clips in the gallbladder fossa postcholecystectomy. No biliary dilatation. Pancreas: No ductal dilatation or inflammation. Punctate calcification in the uncinate process is unchanged. Spleen: Normal in size without focal abnormality.  No splenomegaly. Adrenals/Urinary Tract: Normal adrenal glands. No hydronephrosis or perinephric edema. No urolithiasis. Urinary bladder is physiologically distended without wall thickening. Stomach/Bowel: Limited assessment secondary to lack of oral contrast on presence of intra-abdominal ascites. No evidence of obstruction. Colonic diverticulosis without inflammation. The appendix is not visualized. Small bowel is decompressed. Stomach is minimally distended. Probable perigastric varices. Vascular/Lymphatic: Aortic and branch atherosclerosis without aneurysm. Limited assessment for adenopathy given lack of contrast and  presence of intra-abdominal ascites, no gross adenopathy is seen. Reproductive: Post hysterectomy.  No evidence of adnexal mass. Other: Large volume of abdominopelvic ascites. Fluid measures simple density. No free air. There is associated mesenteric edema. Mild body wall edema anteriorly. Musculoskeletal: Unchanged bone island in the right acetabulum. There are no acute or suspicious osseous abnormalities. Degenerative disc disease and facet arthropathy in the lumbar spine. IMPRESSION: 1. Cirrhotic liver with large volume abdominopelvic ascites. 2. No definite acute abnormality apparent 3. Aortic and branch atherosclerosis. Electronically Signed   By: Jeb Levering M.D.   On: 06/25/2016 17:53   Dg Chest 2 View  Result Date: 06/25/2016 CLINICAL DATA:  Chronic shortness of breath. EXAM: CHEST  2 VIEW COMPARISON:  Radiographs of May 10, 2016. FINDINGS: Stable cardiomediastinal silhouette. Sternotomy wires are noted. Hypoinflation of the lungs is noted. Mild bibasilar subsegmental atelectasis is noted. No pneumothorax or pleural effusion is noted. Bony thorax is unremarkable. IMPRESSION: Hypoinflation of the lungs. Mild bibasilar subsegmental atelectasis. Electronically Signed   By: Marijo Conception, M.D.   On: 06/25/2016 15:36   US Paracentesis  Result Date: 06/26/2016 INDICATION: ascites EXAM: ULTRASOUND-GUIDED PARACENTESIS COMPARISON:  Previous paracentesis MEDICATIONS: 10 cc 1% lidocaine. COMPLICATIONS: None immediate. TECHNIQUE: Informed written consent was obtained from the patient after a discussion of the risks, benefits and alternatives to treatment. A timeout was performed prior to the initiation of the procedure. Initial ultrasound scanning demonstrates a large amount of ascites within the right lower abdominal quadrant. The right lower abdomen was prepped and draped in the usual sterile fashion. 1% lidocaine with epinephrine was used for local anesthesia. Under direct ultrasound guidance, a 19  gauge, 7-cm, Yueh catheter was introduced. An ultrasound image was saved for documentation purposed. The paracentesis was performed. The catheter was removed and a dressing was applied. The patient tolerated the procedure well without immediate post procedural complication. FINDINGS: A total of approximately 2 liters maximum per MD of milky fluid was removed. Samples were sent to the laboratory as requested by the clinical team. IMPRESSION: Successful ultrasound-guided paracentesis yielding 2 liters maximum of peritoneal fluid. Read by Lavonia Drafts [facility] Electronically Signed   By: Jacqulynn Cadet M.D.   On: 06/26/2016 09:24        Scheduled Meds: . enoxaparin (LOVENOX) injection  30 mg Subcutaneous Q24H  . feeding supplement (GLUCERNA SHAKE)  237 mL Oral BID BM  . insulin aspart  0-5 Units Subcutaneous QHS  . insulin aspart  0-9 Units Subcutaneous TID WC  . lactulose  10 g Oral BID  . metoprolol tartrate  12.5 mg Oral BID  . rifaximin  550 mg Oral BID   Continuous Infusions:    LOS: 1 day    Chayse Zatarain Tanna Furry, MD [facility] Pager [phone]  If 7PM-7AM, please contact night-coverage [email or web address] Password [facility] 06/27/2016,  12:28 PM

## 2016-06-27 NOTE — Progress Notes (Addendum)
[facility] Gastroenterology Progress Note  Natalie Lane 71 y.o. 1945-10-18  CC:  Refractory ascites   Subjective: Patient is feeling better. Sitting comfortable in the recliner. Had an episode of atrial flutter earlier this morning which is currently resolved now. No acute GI symptoms.  ROS : Negative for chest pain and shortness of breath. Negative for GI bleed.   Objective: Vital signs in last 24 hours: Vitals:   06/27/16 1000 06/27/16 1021  BP: 108/84 96/64  Pulse: 91 (!) 112  Resp: 16 16  Temp: 97.8 F (36.6 C) 97.5 F (36.4 C)    Physical Exam:  General:   Alert,  Well-developed, well-nourished, pleasant and cooperative in NAD HEENT : Normocephalic, atraumatic, extraocular movement intact. Oropharynx clear and moist. No scleral icterus. Lungs:  Fine basilar crackles noted. No respiratory distress. Heart:  Regular rate and rhythm; no murmurs, clicks, rubs,  or gallops. Abdomen: Abdomen remains distended without any tenderness. Bowel sounds present. No peritoneal signs. Difficult to appreciate hepatosplenomegaly given distended abdomen with ascites. LE : trace edema     Lab Results:  Recent Labs  06/26/16 0640 06/27/16 0513  NA 136 136  K 4.1 4.1  CL 102 103  CO2 23 21*  GLUCOSE 148* 110*  BUN 43* 35*  CREATININE 1.89* 1.54*  CALCIUM 9.2 9.4    Recent Labs  06/25/16 1658 06/27/16 0513  AST 27 22  ALT 18 12*  ALKPHOS 45 29*  BILITOT 1.1 1.8*  PROT 7.2 6.4*  ALBUMIN 3.2* 4.7    Recent Labs  06/25/16 1428 06/26/16 0640  WBC 8.1 5.8  NEUTROABS  --  3.5  HGB 14.1 13.6  HCT 41.6 40.9  MCV 95.9 97.1  PLT 272 240    Recent Labs  06/26/16 0006  LABPROT 15.9*  INR 1.27      Assessment/Plan: - Refractory ascites. Status post paracentesis with removal of 2 L of Milky fluid yesterday - Acute kidney injury. Most likely from diuretics. Improving - Decompensated cryptogenic cirrhosis with refractory ascites and history of esophageal variceal as  well as encephalopathy.    Recommendations --------------------------- - Fluid analysis is negative for SBP. SAAG > 1.1 consistent with portal hypertension. Patient was noted to have milky fluid during paracentesis. Patient also had a serosanguineous fluid drained during previous paracentesis in March. Mild elevated acetic fluid LDH which is stable compared to prior acetic fluid LDH. Recommend checking ascites fluid triglyceride during next paracentesis to rule out chylous ascites. She had a CT scan on admission which showed no evidence of malignancy although it was performed without IV contrast - I Think given her history of atrial flutter today as well as ongoing elevation of kidney function, it seems reasonable to observe patient one more day. Decision to restart low-dose diuretics based on tomorrow's blood work. Patient was on Lasix 60 mg as well as spironolactone 150 mg prior to this admission. Avoid beta blocker given refractory ascites. - GI will follow.   Otis Brace MD, [place] 06/27/2016, 11:29 AM  Pager [phone]  If no answer or after 5 PM call [phone]

## 2016-06-28 LAB — URINE CULTURE

## 2016-06-28 LAB — GLUCOSE, CAPILLARY
GLUCOSE-CAPILLARY: 131 mg/dL — AB (ref 65–99)
GLUCOSE-CAPILLARY: 200 mg/dL — AB (ref 65–99)
Glucose-Capillary: 200 mg/dL — ABNORMAL HIGH (ref 65–99)
Glucose-Capillary: 186 mg/dL — ABNORMAL HIGH (ref 65–99)

## 2016-06-28 LAB — COMPREHENSIVE METABOLIC PANEL
ALBUMIN: 4.2 g/dL (ref 3.5–5.0)
ALK PHOS: 33 U/L — AB (ref 38–126)
ALT: 15 U/L (ref 14–54)
AST: 23 U/L (ref 15–41)
Anion gap: 12 (ref 5–15)
BILIRUBIN TOTAL: 1.7 mg/dL — AB (ref 0.3–1.2)
BUN: 29 mg/dL — ABNORMAL HIGH (ref 6–20)
CO2: 21 mmol/L — ABNORMAL LOW (ref 22–32)
CREATININE: 1.32 mg/dL — AB (ref 0.44–1.00)
Calcium: 9 mg/dL (ref 8.9–10.3)
Chloride: 103 mmol/L (ref 101–111)
GFR calc Af Amer: 47 mL/min — ABNORMAL LOW (ref >60)
GFR calc non Af Amer: 40 mL/min — ABNORMAL LOW (ref >60)
Glucose, Bld: 127 mg/dL — ABNORMAL HIGH (ref 65–99)
Potassium: 4 mmol/L (ref 3.5–5.1)
Sodium: 136 mmol/L (ref 135–145)
Total Protein: 6.5 g/dL (ref 6.5–8.1)

## 2016-06-28 LAB — MAGNESIUM: MAGNESIUM: 1.9 mg/dL (ref 1.7–2.4)

## 2016-06-28 LAB — PATHOLOGIST SMEAR REVIEW

## 2016-06-28 MED ORDER — FUROSEMIDE 40 MG PO TABS
40.0000 mg | ORAL_TABLET | Freq: Every day | ORAL | Status: DC
  Administered 2016-06-28 – 2016-06-30 (×3): 40 mg via ORAL
  Filled 2016-06-28 (×3): qty 1

## 2016-06-28 MED ORDER — ENOXAPARIN SODIUM 40 MG/0.4ML ~~LOC~~ SOLN
40.0000 mg | SUBCUTANEOUS | Status: DC
  Administered 2016-06-29 – 2016-06-30 (×2): 40 mg via SUBCUTANEOUS
  Filled 2016-06-28 (×2): qty 0.4

## 2016-06-28 MED ORDER — SPIRONOLACTONE 50 MG PO TABS
50.0000 mg | ORAL_TABLET | Freq: Every day | ORAL | Status: DC
  Administered 2016-06-28 – 2016-06-30 (×3): 50 mg via ORAL
  Filled 2016-06-28 (×3): qty 1

## 2016-06-28 NOTE — Progress Notes (Signed)
Subjective: Patient was seen and examined at bedside. She states that breathing has improved since she had paracentesis. However, the abdomen still appears distended. Today morning, she experienced an episode of palpitation and her recorded heart rate was up to 169 minute, after receiving oral metoprolol, her HR is currently in 80s. She reports 3 bowel movements yesterday and none today.  Objective: Vital signs in last 24 hours: Temp:  [97.5 F (36.4 C)-98.3 F (36.8 C)] 98.3 F (36.8 C) (05/14 0539) Pulse Rate:  [87-112] 87 (05/14 0539) Resp:  [16-18] 16 (05/14 0539) BP: (96-118)/(48-90) 98/61 (05/14 0539) SpO2:  [96 %-100 %] 96 % (05/14 0539) Weight:  [87.9 kg (193 lb 12.8 oz)] 87.9 kg (193 lb 12.8 oz) (05/13 2150) Weight change: 0.771 kg (1 lb 11.2 oz) Last BM Date: 06/27/16  PE: No obvious pallor or icterus noted,able to speak in full sentences without using accessory muscles of respiration GENERAL:not in acute distress, alert, weak and oriented to time, place and person, no asterixis noted ABDOMEN: Distended, tense, no obvious tenderness, bowel sounds normoactive EXTREMITIES: Minimal trace pedal edema bilaterally  Lab Results: Results for orders placed or performed during the hospital encounter of 06/25/16 (from the past 48 hour(s))  Lactate dehydrogenase (pleural or peritoneal fluid)     Status: Abnormal   Collection Time: 06/26/16  9:22 AM  Result Value Ref Range   LD, Fluid 51 (H) 3 - 23 U/L    Comment: (NOTE) Results should be evaluated in conjunction with serum values    Fluid Type-FLDH Peritoneal   Body fluid cell count with differential     Status: Abnormal   Collection Time: 06/26/16  9:22 AM  Result Value Ref Range   Fluid Type-FCT Peritoneal    Color, Fluid PINK (A) YELLOW   Appearance, Fluid TURBID (A) CLEAR   WBC, Fluid 386 0 - 1,000 cu mm   Neutrophil Count, Fluid 5 0 - 25 %   Lymphs, Fluid 44 %   Monocyte-Macrophage-Serous Fluid 51 50 - 90 %   Eos, Fluid  0 %   Other Cells, Fluid MESOTHELIAL CELLS PRESENT %  Anaerobic culture     Status: None (Preliminary result)   Collection Time: 06/26/16  9:22 AM  Result Value Ref Range   Specimen Description FLUID PERITONEAL    Special Requests NONE    Culture      NO ANAEROBES ISOLATED; CULTURE IN PROGRESS FOR 5 DAYS   Report Status PENDING   Body fluid culture     Status: None (Preliminary result)   Collection Time: 06/26/16  9:22 AM  Result Value Ref Range   Specimen Description FLUID PERITONEAL    Special Requests NONE    Gram Stain      MODERATE WBC PRESENT, PREDOMINANTLY MONONUCLEAR NO ORGANISMS SEEN    Culture NO GROWTH 1 DAY    Report Status PENDING   Albumin, pleural or peritoneal fluid     Status: None   Collection Time: 06/26/16  9:22 AM  Result Value Ref Range   Albumin, Fluid 1.2 g/dL    Comment: (NOTE) No normal range established for this test Results should be evaluated in conjunction with serum values    Fluid Type-FALB Peritoneal   Protein, pleural or peritoneal fluid     Status: None   Collection Time: 06/26/16  9:22 AM  Result Value Ref Range   Total protein, fluid <3.0 g/dL    Comment: (NOTE) No normal range established for this test Results should be evaluated  in conjunction with serum values    Fluid Type-FTP Peritoneal   Glucose, pleural or peritoneal fluid     Status: None   Collection Time: 06/26/16  9:22 AM  Result Value Ref Range   Glucose, Fluid 151 mg/dL    Comment: (NOTE) No normal range established for this test Results should be evaluated in conjunction with serum values    Fluid Type-FGLU Peritoneal   PH, Body Fluid     Status: None   Collection Time: 06/26/16  9:22 AM  Result Value Ref Range   pH, Body Fluid 7.7 Not Estab.    Comment: (NOTE) This test was developed and its performance characteristics determined by [facility]. It has not been cleared or approved by the Food and Drug Administration. Performed At: [facility] [place], [place] [id number] Lindon Romp MD RA:[id number]    Source of Sample PERITONEAL   Glucose, capillary     Status: Abnormal   Collection Time: 06/26/16  9:50 AM  Result Value Ref Range   Glucose-Capillary 126 (H) 65 - 99 mg/dL  Glucose, capillary     Status: Abnormal   Collection Time: 06/26/16 12:03 PM  Result Value Ref Range   Glucose-Capillary 201 (H) 65 - 99 mg/dL  Glucose, capillary     Status: Abnormal   Collection Time: 06/26/16  5:41 PM  Result Value Ref Range   Glucose-Capillary 195 (H) 65 - 99 mg/dL  Urine culture     Status: Abnormal (Preliminary result)   Collection Time: 06/26/16  6:30 PM  Result Value Ref Range   Specimen Description URINE, RANDOM    Special Requests NONE    Culture (A)     >=100,000 COLONIES/mL ENTEROBACTER AEROGENES SUSCEPTIBILITIES TO FOLLOW    Report Status PENDING   Glucose, capillary     Status: Abnormal   Collection Time: 06/26/16  8:50 PM  Result Value Ref Range   Glucose-Capillary 147 (H) 65 - 99 mg/dL  Comprehensive metabolic panel     Status: Abnormal   Collection Time: 06/27/16  5:13 AM  Result Value Ref Range   Sodium 136 135 - 145 mmol/L   Potassium 4.1 3.5 - 5.1 mmol/L    Comment: SLIGHT HEMOLYSIS   Chloride 103 101 - 111 mmol/L   CO2 21 (L) 22 - 32 mmol/L   Glucose, Bld 110 (H) 65 - 99 mg/dL   BUN 35 (H) 6 - 20 mg/dL   Creatinine, Ser 1.54 (H) 0.44 - 1.00 mg/dL   Calcium 9.4 8.9 - 10.3 mg/dL   Total Protein 6.4 (L) 6.5 - 8.1 g/dL   Albumin 4.7 3.5 - 5.0 g/dL   AST 22 15 - 41 U/L   ALT 12 (L) 14 - 54 U/L   Alkaline Phosphatase 29 (L) 38 - 126 U/L   Total Bilirubin 1.8 (H) 0.3 - 1.2 mg/dL   GFR calc non Af Amer 33 (L) >60 mL/min   GFR calc Af Amer 39 (L) >60 mL/min    Comment: (NOTE) The eGFR has been calculated using the CKD EPI equation. This calculation has not been validated in all clinical situations. eGFR's persistently <60 mL/min signify possible Chronic Kidney Disease.    Anion gap 12 5 - 15   Glucose, capillary     Status: Abnormal   Collection Time: 06/27/16  8:21 AM  Result Value Ref Range   Glucose-Capillary 154 (H) 65 - 99 mg/dL  Glucose, capillary     Status: Abnormal   Collection  Time: 06/27/16 11:53 AM  Result Value Ref Range   Glucose-Capillary 249 (H) 65 - 99 mg/dL  Glucose, capillary     Status: Abnormal   Collection Time: 06/27/16  4:36 PM  Result Value Ref Range   Glucose-Capillary 218 (H) 65 - 99 mg/dL  Glucose, capillary     Status: Abnormal   Collection Time: 06/27/16  9:47 PM  Result Value Ref Range   Glucose-Capillary 165 (H) 65 - 99 mg/dL  Comprehensive metabolic panel     Status: Abnormal   Collection Time: 06/28/16  5:58 AM  Result Value Ref Range   Sodium 136 135 - 145 mmol/L   Potassium 4.0 3.5 - 5.1 mmol/L   Chloride 103 101 - 111 mmol/L   CO2 21 (L) 22 - 32 mmol/L   Glucose, Bld 127 (H) 65 - 99 mg/dL   BUN 29 (H) 6 - 20 mg/dL   Creatinine, Ser 1.32 (H) 0.44 - 1.00 mg/dL   Calcium 9.0 8.9 - 10.3 mg/dL   Total Protein 6.5 6.5 - 8.1 g/dL   Albumin 4.2 3.5 - 5.0 g/dL   AST 23 15 - 41 U/L   ALT 15 14 - 54 U/L   Alkaline Phosphatase 33 (L) 38 - 126 U/L   Total Bilirubin 1.7 (H) 0.3 - 1.2 mg/dL   GFR calc non Af Amer 40 (L) >60 mL/min   GFR calc Af Amer 47 (L) >60 mL/min    Comment: (NOTE) The eGFR has been calculated using the CKD EPI equation. This calculation has not been validated in all clinical situations. eGFR's persistently <60 mL/min signify possible Chronic Kidney Disease.    Anion gap 12 5 - 15  Glucose, capillary     Status: Abnormal   Collection Time: 06/28/16  7:42 AM  Result Value Ref Range   Glucose-Capillary 131 (H) 65 - 99 mg/dL    Studies/Results: US Paracentesis  Result Date: 06/26/2016 INDICATION: ascites EXAM: ULTRASOUND-GUIDED PARACENTESIS COMPARISON:  Previous paracentesis MEDICATIONS: 10 cc 1% lidocaine. COMPLICATIONS: None immediate. TECHNIQUE: Informed written consent was obtained from the patient after a  discussion of the risks, benefits and alternatives to treatment. A timeout was performed prior to the initiation of the procedure. Initial ultrasound scanning demonstrates a large amount of ascites within the right lower abdominal quadrant. The right lower abdomen was prepped and draped in the usual sterile fashion. 1% lidocaine with epinephrine was used for local anesthesia. Under direct ultrasound guidance, a 19 gauge, 7-cm, Yueh catheter was introduced. An ultrasound image was saved for documentation purposed. The paracentesis was performed. The catheter was removed and a dressing was applied. The patient tolerated the procedure well without immediate post procedural complication. FINDINGS: A total of approximately 2 liters maximum per MD of milky fluid was removed. Samples were sent to the laboratory as requested by the clinical team. IMPRESSION: Successful ultrasound-guided paracentesis yielding 2 liters maximum of peritoneal fluid. Read by Lavonia Drafts [facility] Electronically Signed   By: Jacqulynn Cadet M.D.   On: 06/26/2016 09:24    Medications: I have reviewed the patient's current medications.  Assessment: Decompensated Cirrhosis, refractory ascites Impaired renal function History of esophageal varices History of hepatic encephalopathy Atrial fibrillation fibrillation  Plan: MELD score on admission was 16. Status post 2 Lparacentesis, no evidence of SBP. Improvement in renal function. Plan to start low-dose diuretics Lasix 40 mg by mouth daily, spironolactone 50 mg by mouth daily from today. Monitor renal function and in am. If creatinine increases/GFR decreases patient  might benefit from use of IV albumin. Monitor daily intake and output along with daily weights. Continue Xifaxan and lactulose as scheduled.  Patient currently, not on a nonselective beta blocker(propranolol discontinued and started on metoprolol) because of history of atrial fibrillation and intermittent increase in  heart rate. No renal function continues to be stable or improves, okay to discharge in a.m   Ronnette Juniper 06/28/2016, 8:45 AM   Pager [phone] If no answer or after 5 PM call [phone]

## 2016-06-28 NOTE — Progress Notes (Addendum)
PROGRESS NOTE    Natalie Lane  DDU:[id number] DOB: Oct 15, 1945 DOA: 06/25/2016 PCP: Carol Ada, MD   Brief Narrative: 71 year old female with history of paroxysmal atrial tachycardia, cryptogenic cirrhosis with ascites and gastric varices status post banding by Dr. Michail Sermon, recently increased the dose of diuretics, had a paracentesis in the past presented with generalized abdominal pain. In the ER patient was found to have tachycardia, history of present illness with elevated serum creatinine level of 2.1 and admitted for further evaluation.  Assessment & Plan:  # Decompensated liver cirrhosis with ascites and history of gastric varices: -Status post ultrasound-guided paracentesis with removal of withdrawal fluid. Fluid is studies with no infection.  -Serum creatinine level improving. Resuming oral Lasix and low-dose Aldactone today. Discussed with GI today. Continue to monitor renal function. No abdominal pain. -Continue lactulose and Xifaxan and propranolol. -Patient has no fever, no leukocytosis .   #Acute kidney injury likely hemodynamically mediated in the setting of diuretics,  liver disease and use of Motrin. Serum creatinine level improving. Serum creatinine level I.3 today. Monitor BMP in the morning after starting diuretics. -Education provided to the patient to avoid NSAIDs.  #Hypotension: Continue to monitor closely.  #Paroxysmal atrial tachycardia ; episodes of tachycardia yesterday and today. Heart rate better with oral/IV metoprolol. Resume acceptable. Not on nonselective beta blocker because of hypotension. Continue to monitor heart rate and telemetric monitoring. -echo on 3/28 showed normal EF/normal systolic function.  #Type 2 diabetes: Continue sliding scale. Monitor blood sugar level.  #Hypoalbuminemia in the setting of liver disease. Received IV albumin as per GI.  labs improving  Principal Problem:   Decompensated hepatic cirrhosis (HCC) Active Problems:  Ascites   PAT (paroxysmal atrial tachycardia) (HCC)   ARF (acute renal failure) (HCC)   Tobacco use   AKI (acute kidney injury) ([place])   Decompensated liver disease (HCC)  DVT prophylaxis: Lovenox subcutaneous Code Status: Full code Family Communication: Discussed with the patient's son at bedside Disposition Plan: Likely discharge home in 1-2 days    Consultants:   GI  Procedures: Ultrasound paracentesis Antimicrobials: None  Subjective: Seen and examined at bedside. Reports abdominal pain is better. Denied headache, dizziness, nausea, vomiting, chest or shortness of breath.  Objective: Vitals:   06/27/16 1536 06/27/16 2150 06/28/16 0539 06/28/16 0947  BP: (!) 102/48 108/62 98/61 113/67  Pulse: 87 93 87 89  Resp: 16 18 16 18   Temp: 98.1 F (36.7 C) 98.2 F (36.8 C) 98.3 F (36.8 C) 98.8 F (37.1 C)  TempSrc: Oral Oral Oral Oral  SpO2: 100% 96% 96% 97%  Weight:  87.9 kg (193 lb 12.8 oz)    Height:        Intake/Output Summary (Last 24 hours) at 06/28/16 1400 Last data filed at 06/28/16 1300  Gross per 24 hour  Intake              960 ml  Output                0 ml  Net              960 ml   Filed Weights   06/25/16 2346 06/26/16 2053 06/27/16 2150  Weight: 88.5 kg (195 lb) 87.1 kg (192 lb 1.6 oz) 87.9 kg (193 lb 12.8 oz)    Examination:  General exam: Not in distress Respiratory system: Clear bilateral. Respiratory effort normal. No wheezing or crackle Cardiovascular system: Regular rate rhythm, S1-S2 normal. Trace pedal edema. Gastrointestinal system: Abdomen soft, nontender  but distended which is stable. Normal bowel sound Central nervous system: Alert and oriented. No focal neurological deficits. Extremities: Symmetric 5 x 5 power. Skin: No rashes, lesions or ulcers Psychiatry: Judgement and insight appear normal. Mood & affect appropriate.     Data Reviewed: I have personally reviewed following labs and imaging studies  CBC:  Recent Labs Lab  06/25/16 1428 06/26/16 0640  WBC 8.1 5.8  NEUTROABS  --  3.5  HGB 14.1 13.6  HCT 41.6 40.9  MCV 95.9 97.1  PLT 272 505   Basic Metabolic Panel:  Recent Labs Lab 06/25/16 1428 06/26/16 0640 06/27/16 0513 06/28/16 0558 06/28/16 0830  NA 133* 136 136 136  --   K 5.1 4.1 4.1 4.0  --   CL 100* 102 103 103  --   CO2 21* 23 21* 21*  --   GLUCOSE 159* 148* 110* 127*  --   BUN 47* 43* 35* 29*  --   CREATININE 2.10* 1.89* 1.54* 1.32*  --   CALCIUM 9.7 9.2 9.4 9.0  --   MG  --   --   --   --  1.9   GFR: Estimated Creatinine Clearance: 42.3 mL/min (A) (by C-G formula based on SCr of 1.32 mg/dL (H)). Liver Function Tests:  Recent Labs Lab 06/25/16 1658 06/27/16 0513 06/28/16 0558  AST 27 22 23   ALT 18 12* 15  ALKPHOS 45 29* 33*  BILITOT 1.1 1.8* 1.7*  PROT 7.2 6.4* 6.5  ALBUMIN 3.2* 4.7 4.2   No results for input(s): LIPASE, AMYLASE in the last 168 hours. No results for input(s): AMMONIA in the last 168 hours. Coagulation Profile:  Recent Labs Lab 06/26/16 0006  INR 1.27   Cardiac Enzymes: No results for input(s): CKTOTAL, CKMB, CKMBINDEX, TROPONINI in the last 168 hours. BNP (last 3 results) No results for input(s): PROBNP in the last 8760 hours. HbA1C: No results for input(s): HGBA1C in the last 72 hours. CBG:  Recent Labs Lab 06/27/16 1153 06/27/16 1636 06/27/16 2147 06/28/16 0742 06/28/16 1148  GLUCAP 249* 218* 165* 131* 200*   Lipid Profile: No results for input(s): CHOL, HDL, LDLCALC, TRIG, CHOLHDL, LDLDIRECT in the last 72 hours. Thyroid Function Tests: No results for input(s): TSH, T4TOTAL, FREET4, T3FREE, THYROIDAB in the last 72 hours. Anemia Panel: No results for input(s): VITAMINB12, FOLATE, FERRITIN, TIBC, IRON, RETICCTPCT in the last 72 hours. Sepsis Labs: No results for input(s): PROCALCITON, LATICACIDVEN in the last 168 hours.  Recent Results (from the past 240 hour(s))  Anaerobic culture     Status: None (Preliminary result)    Collection Time: 06/26/16  9:22 AM  Result Value Ref Range Status   Specimen Description FLUID PERITONEAL  Final   Special Requests NONE  Final   Culture   Final    NO ANAEROBES ISOLATED; CULTURE IN PROGRESS FOR 5 DAYS   Report Status PENDING  Incomplete  Body fluid culture     Status: None (Preliminary result)   Collection Time: 06/26/16  9:22 AM  Result Value Ref Range Status   Specimen Description FLUID PERITONEAL  Final   Special Requests NONE  Final   Gram Stain   Final    MODERATE WBC PRESENT, PREDOMINANTLY MONONUCLEAR NO ORGANISMS SEEN    Culture NO GROWTH 2 DAYS  Final   Report Status PENDING  Incomplete  Urine culture     Status: Abnormal   Collection Time: 06/26/16  6:30 PM  Result Value Ref Range Status   Specimen  Description URINE, RANDOM  Final   Special Requests NONE  Final   Culture >=100,000 COLONIES/mL ENTEROBACTER AEROGENES (A)  Final   Report Status 06/28/2016 FINAL  Final   Organism ID, Bacteria ENTEROBACTER AEROGENES (A)  Final      Susceptibility   Enterobacter aerogenes - MIC*    CEFAZOLIN >=64 RESISTANT Resistant     CEFTRIAXONE <=1 SENSITIVE Sensitive     CIPROFLOXACIN <=0.25 SENSITIVE Sensitive     GENTAMICIN <=1 SENSITIVE Sensitive     IMIPENEM 1 SENSITIVE Sensitive     NITROFURANTOIN 64 INTERMEDIATE Intermediate     TRIMETH/SULFA <=20 SENSITIVE Sensitive     PIP/TAZO <=4 SENSITIVE Sensitive     * >=100,000 COLONIES/mL ENTEROBACTER AEROGENES         Radiology Studies: No results found.      Scheduled Meds: . [START ON 06/29/2016] enoxaparin (LOVENOX) injection  40 mg Subcutaneous Q24H  . feeding supplement (GLUCERNA SHAKE)  237 mL Oral BID BM  . furosemide  40 mg Oral Daily  . insulin aspart  0-5 Units Subcutaneous QHS  . insulin aspart  0-9 Units Subcutaneous TID WC  . lactulose  10 g Oral BID  . metoprolol tartrate  12.5 mg Oral BID  . rifaximin  550 mg Oral BID  . spironolactone  50 mg Oral Daily   Continuous Infusions:     LOS: 2 days    Zaydenn Balaguer Tanna Furry, MD [facility] Pager [phone]  If 7PM-7AM, please contact night-coverage [email or web address] Password [email or web address] 06/28/2016, 2:00 PM

## 2016-06-28 NOTE — Progress Notes (Signed)
Nurse notified that pt had a run of [place], but back in [facility] now. Notified MD. Pt resting well. Nurse in room.

## 2016-06-28 NOTE — Evaluation (Signed)
Occupational Therapy Evaluation Patient Details Name: Natalie Lane MRN: [id number] DOB: 04-Sep-1945 Today's Date: 06/28/2016    History of Present Illness Pt is a 71 y/o F with history of paroxysmal atrial tachycardia, cryptogenic cirrhosis with ascites and gastric varices s/p banding, previous paracentesis, presented with generalized abdominal pain and found to have tachycardia in the ER   Clinical Impression   This 71 y/o F presents with the above. At baseline Pt is ModI with ADLs and functional mobility. Pt currently requires MinGuard assist for ADLs and functional mobility. Pt usually lives alone, reports her significant other has been staying with her and assisting with some IADLs prior to this admission. Pt reports increased dyspnea with ADLs/IADLs prior to admission. Educated Pt on energy conservation techniques to increase activity tolerance during ADL completion and functional mobility and handout provided. Pt will benefit from continued OT services while in acute setting to increase activity tolerance, safety, and independence with ADLs and functional mobility.    Follow Up Recommendations  No OT follow up;Supervision/Assistance - 24 hour (initially)    Equipment Recommendations  None recommended by OT           Precautions / Restrictions Precautions Precautions:  (abdominal) Restrictions Weight Bearing Restrictions: No      Mobility Bed Mobility Overal bed mobility: Modified Independent             General bed mobility comments: Pt has handrail at home which she uses for bed mobility; educated Pt on log roll technique to reduce pressure on abdomen (Pt moved to sitting EOB while OT was gathering linens outside of room for session)  Transfers Overall transfer level: Needs assistance   Transfers: Sit to/from Stand Sit to Stand: Supervision         General transfer comment: for safety     Balance Overall balance assessment: No apparent balance deficits (not  formally assessed)                                         ADL either performed or assessed with clinical judgement   ADL Overall ADL's : Needs assistance/impaired Eating/Feeding: Independent;Sitting   Grooming: Wash/dry hands;Min guard;Standing   Upper Body Bathing: Sitting;Set up   Lower Body Bathing: Min guard;Sit to/from stand   Upper Body Dressing : Set up;Sitting   Lower Body Dressing: Min guard;Sit to/from stand Lower Body Dressing Details (indicate cue type and reason): donning socks Toilet Transfer: Min guard;Ambulation;Regular Glass blower/designer Details (indicate cue type and reason): simulated through sit to/from stand transfer to [place] and Hygiene: Min guard;Sit to/from stand       Functional mobility during ADLs: Min guard General ADL Comments: Educated Pt on energy conservation strategies and techniques for completing ADLs, educated on compensatory techniques to alleviate added pressure on abdomen during ADL completion; Pt's HR remaining at 120-135 during sitting EOB and standing      Vision Baseline Vision/History: Wears glasses Wears Glasses: At all times                  Pertinent Vitals/Pain Pain Assessment: Faces Faces Pain Scale: Hurts a little bit Pain Location: abdomen  Pain Descriptors / Indicators: Sore Pain Intervention(s): Limited activity within patient's tolerance;Monitored during session     Hand Dominance Right   Extremity/Trunk Assessment Upper Extremity Assessment Upper Extremity Assessment: Overall WFL for tasks assessed  Communication Communication Communication: No difficulties   Cognition Arousal/Alertness: Awake/alert Behavior During Therapy: WFL for tasks assessed/performed Overall Cognitive Status: Within Functional Limits for tasks assessed                                 General Comments: Pt tearful during session, stating she's not  used to being taken care of (used to work as a Music therapist)    [place] expects to be discharged to:: Private residence Living Arrangements: Alone Available Help at Discharge: Friend(s);Available PRN/intermittently (Pt's boyfriend is currently visiting from [place]) Type of Home: Other(Comment) (Condo) Home Access: Stairs to enter [employer] of Steps: 2 Entrance Stairs-Rails: Right;Left Home Layout: One level     Bathroom Shower/Tub: Teacher, early years/pre: Standard     Home Equipment: Cane - single point;Grab bars - tub/shower;Bedside commode          Prior Functioning/Environment Level of Independence: Independent with assistive device(s)        Comments: uses SPC to get to the car        OT Problem List: Decreased strength;Decreased activity tolerance      OT Treatment/Interventions: Self-care/ADL training;Energy conservation;Therapeutic exercise;DME and/or AE instruction;Therapeutic activities    OT Goals(Current goals can be found in the care plan section) Acute Rehab OT Goals Patient Stated Goal: to regain her independence  OT Goal Formulation: With patient Time For Goal Achievement: 07/12/16 Potential to Achieve Goals: Good ADL Goals Pt Will Perform Grooming: standing;with modified independence Pt Will Perform Lower Body Bathing: with modified independence;sit to/from stand Pt Will Perform Lower Body Dressing: with modified independence;sit to/from stand Pt Will Perform Toileting - Clothing Manipulation and hygiene: with modified independence;sit to/from stand Pt Will Perform Tub/Shower Transfer: with modified independence;Tub transfer;ambulating;3 in 1 Pt/caregiver will Perform Home Exercise Program: Increased strength;Independently;Both right and left upper extremity (fine motor/coordination) Additional ADL Goal #1: Pt will independently demonstrate at least one energy conservation  technique during ADL completion.   OT Frequency: Min 2X/week               Co-evaluation              AM-PAC PT "6 Clicks" Daily Activity     Outcome Measure Help from another person eating meals?: None Help from another person taking care of personal grooming?: A Little Help from another person toileting, which includes using toliet, bedpan, or urinal?: A Little Help from another person bathing (including washing, rinsing, drying)?: A Little Help from another person to put on and taking off regular upper body clothing?: None Help from another person to put on and taking off regular lower body clothing?: A Little 6 Click Score: 20   End of Session Equipment Utilized During Treatment: Gait belt Nurse Communication: Mobility status  Activity Tolerance: Patient tolerated treatment well Patient left: in chair;with call bell/phone within reach  OT Visit Diagnosis: Muscle weakness (generalized) (M62.81)                Time: 1607-3710 OT Time Calculation (min): 25 min Charges:  OT General Charges $OT Visit: 1 Procedure OT Evaluation $OT Eval Low Complexity: 1 Procedure G-Codes:     Lou Cal, OT Pager [phone] 06/28/2016   Raymondo Band 06/28/2016, 5:07 PM

## 2016-06-29 ENCOUNTER — Encounter (HOSPITAL_COMMUNITY): Payer: Self-pay | Admitting: Nurse Practitioner

## 2016-06-29 LAB — TSH: TSH: 1.799 u[IU]/mL (ref 0.350–4.500)

## 2016-06-29 LAB — BODY FLUID CULTURE: Culture: NO GROWTH

## 2016-06-29 LAB — GLUCOSE, CAPILLARY
GLUCOSE-CAPILLARY: 139 mg/dL — AB (ref 65–99)
GLUCOSE-CAPILLARY: 201 mg/dL — AB (ref 65–99)
GLUCOSE-CAPILLARY: 197 mg/dL — AB (ref 65–99)
Glucose-Capillary: 184 mg/dL — ABNORMAL HIGH (ref 65–99)

## 2016-06-29 LAB — BASIC METABOLIC PANEL
Anion gap: 9 (ref 5–15)
BUN: 27 mg/dL — ABNORMAL HIGH (ref 6–20)
CALCIUM: 8.9 mg/dL (ref 8.9–10.3)
CHLORIDE: 104 mmol/L (ref 101–111)
CO2: 24 mmol/L (ref 22–32)
CREATININE: 1.15 mg/dL — AB (ref 0.44–1.00)
GFR calc Af Amer: 55 mL/min — ABNORMAL LOW (ref >60)
GFR calc non Af Amer: 47 mL/min — ABNORMAL LOW (ref >60)
Glucose, Bld: 130 mg/dL — ABNORMAL HIGH (ref 65–99)
Potassium: 4.7 mmol/L (ref 3.5–5.1)
SODIUM: 137 mmol/L (ref 135–145)

## 2016-06-29 MED ORDER — AMIODARONE HCL 200 MG PO TABS
400.0000 mg | ORAL_TABLET | Freq: Two times a day (BID) | ORAL | Status: DC
  Administered 2016-06-29 – 2016-06-30 (×2): 400 mg via ORAL
  Filled 2016-06-29 (×2): qty 2

## 2016-06-29 MED ORDER — LACTULOSE 10 GM/15ML PO SOLN: 30.0000 g | Freq: Once | ORAL | Status: DC

## 2016-06-29 MED ORDER — METOPROLOL TARTRATE 5 MG/5ML IV SOLN
5.0000 mg | Freq: Once | INTRAVENOUS | Status: AC
  Administered 2016-06-29: 5 mg via INTRAVENOUS

## 2016-06-29 MED ORDER — SODIUM CHLORIDE 0.9 % IV BOLUS (SEPSIS)
500.0000 mL | Freq: Once | INTRAVENOUS | Status: AC
  Administered 2016-06-29: 500 mL via INTRAVENOUS

## 2016-06-29 MED ORDER — LACTULOSE 10 GM/15ML PO SOLN
30.0000 g | Freq: Three times a day (TID) | ORAL | Status: DC
  Administered 2016-06-29 – 2016-06-30 (×4): 30 g via ORAL
  Filled 2016-06-29 (×4): qty 45

## 2016-06-29 NOTE — Progress Notes (Addendum)
PROGRESS NOTE    Natalie Lane  VFI:[id number] DOB: [birth year] DOA: 06/25/2016 PCP: Carol Ada, MD   Brief Narrative: 71 year old female with history of paroxysmal atrial tachycardia, cryptogenic cirrhosis with ascites and gastric varices status post banding by Dr. Michail Sermon, recently increased the dose of diuretics, had a paracentesis in the past presented with generalized abdominal pain. In the ER patient was found to have tachycardia, history of present illness with elevated serum creatinine level of 2.1 and admitted for further evaluation.  Assessment & Plan:  # Decompensated liver cirrhosis with ascites and history of gastric varices: -Status post ultrasound-guided paracentesis with removal of withdrawal fluid. Fluid  studies with no infection.  -Resumed oral Lasix, Aldactone lower dose, tolerating well. Serum creatinine level trending down. Patient however with arrhythmia and hypertension. Evaluation ongoing. -Continue lactulose and Xifaxan and propranolol. -Patient has no fever, no leukocytosis. -GI consult appreciated, 2, and outpatient follow-up with GI. PT/OT evaluation pending.  #Acute kidney injury likely hemodynamically mediated in the setting of diuretics,  liver disease and use of Motrin. Serum creatinine level improving. Serum creatinine level trending down. Recommended to avoid NSAIDs. Monitor BMP. Avoid nephrotoxins  #Hypotension: Continue to monitor closely.  #Paroxysmal atrial tachycardia ; patient with intermittent atrial tachycardia with heart rate 150s. Patient with hypotension systolic blood pressure 41Y today.On low-dose metoprolol because of hypotension. Repeat EKG today. Checking TSH level. Cardiology consult requested to further evaluate the patient. Continue telemetry monitoring and follow-up consultants eval. patient does not have chest pain, shortness of breath, headache or dizziness. -echo on 3/28 showed normal EF/normal systolic function.  #Type 2  diabetes: Continue sliding scale. Monitor blood sugar level.  #Hypoalbuminemia in the setting of liver disease. Received IV albumin as per GI.  labs improving  Principal Problem:   Decompensated hepatic cirrhosis (HCC) Active Problems:   Ascites   PAT (paroxysmal atrial tachycardia) (HCC)   ARF (acute renal failure) (HCC)   Tobacco use   AKI (acute kidney injury) ([place])   Decompensated liver disease (HCC)  DVT prophylaxis: Lovenox subcutaneous Code Status: Full code Family Communication: No family at bedside Disposition Plan: Likely discharge home in 1-2 days     Consultants:   GI  Procedures: Ultrasound paracentesis Antimicrobials: None  Subjective: Seen and examined at bedside. Denied headache, dizziness, chest pain, shortness of breath, abdominal pain, nausea or vomiting. Objective: Vitals:   06/29/16 0227 06/29/16 0451 06/29/16 0919 06/29/16 1015  BP: (!) 88/49 96/76 (!) 87/55 91/64  Pulse: 97 (!) 165 81 (!) 149  Resp:  20 18   Temp:  99.1 F (37.3 C) 97.8 F (36.6 C)   TempSrc:  Oral Oral   SpO2:  94% 97%   Weight:      Height:        Intake/Output Summary (Last 24 hours) at 06/29/16 1309 Last data filed at 06/29/16 1141  Gross per 24 hour  Intake             1440 ml  Output                0 ml  Net             1440 ml   Filed Weights   06/26/16 2053 06/27/16 2150 06/28/16 2143  Weight: 87.1 kg (192 lb 1.6 oz) 87.9 kg (193 lb 12.8 oz) 88.5 kg (195 lb)    Examination:  General exam: Not in distress Respiratory system: Clear bilateral. Respiratory effort normal. No wheezing or crackle Cardiovascular system: Regular  rate and rhythm, S1-2 normal. No pedal edema. Gastrointestinal system: Abdomen soft, nontender but has stable distention. Bowel sound positive Central nervous system: Alert and oriented. No focal neurological deficits. Extremities: Symmetric 5 x 5 power. Skin: No rashes, lesions or ulcers Psychiatry: Judgement and insight appear normal.  Mood & affect appropriate.     Data Reviewed: I have personally reviewed following labs and imaging studies  CBC:  Recent Labs Lab 06/25/16 1428 06/26/16 0640  WBC 8.1 5.8  NEUTROABS  --  3.5  HGB 14.1 13.6  HCT 41.6 40.9  MCV 95.9 97.1  PLT 272 017   Basic Metabolic Panel:  Recent Labs Lab 06/25/16 1428 06/26/16 0640 06/27/16 0513 06/28/16 0558 06/28/16 0830 06/29/16 0705  NA 133* 136 136 136  --  137  K 5.1 4.1 4.1 4.0  --  4.7  CL 100* 102 103 103  --  104  CO2 21* 23 21* 21*  --  24  GLUCOSE 159* 148* 110* 127*  --  130*  BUN 47* 43* 35* 29*  --  27*  CREATININE 2.10* 1.89* 1.54* 1.32*  --  1.15*  CALCIUM 9.7 9.2 9.4 9.0  --  8.9  MG  --   --   --   --  1.9  --    GFR: Estimated Creatinine Clearance: 48.7 mL/min (A) (by C-G formula based on SCr of 1.15 mg/dL (H)). Liver Function Tests:  Recent Labs Lab 06/25/16 1658 06/27/16 0513 06/28/16 0558  AST 27 22 23   ALT 18 12* 15  ALKPHOS 45 29* 33*  BILITOT 1.1 1.8* 1.7*  PROT 7.2 6.4* 6.5  ALBUMIN 3.2* 4.7 4.2   No results for input(s): LIPASE, AMYLASE in the last 168 hours. No results for input(s): AMMONIA in the last 168 hours. Coagulation Profile:  Recent Labs Lab 06/26/16 0006  INR 1.27   Cardiac Enzymes: No results for input(s): CKTOTAL, CKMB, CKMBINDEX, TROPONINI in the last 168 hours. BNP (last 3 results) No results for input(s): PROBNP in the last 8760 hours. HbA1C: No results for input(s): HGBA1C in the last 72 hours. CBG:  Recent Labs Lab 06/28/16 1148 06/28/16 1728 06/28/16 2047 06/29/16 0739 06/29/16 1203  GLUCAP 200* 200* 186* 139* 201*   Lipid Profile: No results for input(s): CHOL, HDL, LDLCALC, TRIG, CHOLHDL, LDLDIRECT in the last 72 hours. Thyroid Function Tests: No results for input(s): TSH, T4TOTAL, FREET4, T3FREE, THYROIDAB in the last 72 hours. Anemia Panel: No results for input(s): VITAMINB12, FOLATE, FERRITIN, TIBC, IRON, RETICCTPCT in the last 72  hours. Sepsis Labs: No results for input(s): PROCALCITON, LATICACIDVEN in the last 168 hours.  Recent Results (from the past 240 hour(s))  Anaerobic culture     Status: None (Preliminary result)   Collection Time: 06/26/16  9:22 AM  Result Value Ref Range Status   Specimen Description FLUID PERITONEAL  Final   Special Requests NONE  Final   Culture   Final    NO ANAEROBES ISOLATED; CULTURE IN PROGRESS FOR 5 DAYS   Report Status PENDING  Incomplete  Body fluid culture     Status: None   Collection Time: 06/26/16  9:22 AM  Result Value Ref Range Status   Specimen Description FLUID PERITONEAL  Final   Special Requests NONE  Final   Gram Stain   Final    MODERATE WBC PRESENT, PREDOMINANTLY MONONUCLEAR NO ORGANISMS SEEN    Culture NO GROWTH 3 DAYS  Final   Report Status 06/29/2016 FINAL  Final  Urine  culture     Status: Abnormal   Collection Time: 06/26/16  6:30 PM  Result Value Ref Range Status   Specimen Description URINE, RANDOM  Final   Special Requests NONE  Final   Culture >=100,000 COLONIES/mL ENTEROBACTER AEROGENES (A)  Final   Report Status 06/28/2016 FINAL  Final   Organism ID, Bacteria ENTEROBACTER AEROGENES (A)  Final      Susceptibility   Enterobacter aerogenes - MIC*    CEFAZOLIN >=64 RESISTANT Resistant     CEFTRIAXONE <=1 SENSITIVE Sensitive     CIPROFLOXACIN <=0.25 SENSITIVE Sensitive     GENTAMICIN <=1 SENSITIVE Sensitive     IMIPENEM 1 SENSITIVE Sensitive     NITROFURANTOIN 64 INTERMEDIATE Intermediate     TRIMETH/SULFA <=20 SENSITIVE Sensitive     PIP/TAZO <=4 SENSITIVE Sensitive     * >=100,000 COLONIES/mL ENTEROBACTER AEROGENES         Radiology Studies: No results found.      Scheduled Meds: . enoxaparin (LOVENOX) injection  40 mg Subcutaneous Q24H  . feeding supplement (GLUCERNA SHAKE)  237 mL Oral BID BM  . furosemide  40 mg Oral Daily  . insulin aspart  0-5 Units Subcutaneous QHS  . insulin aspart  0-9 Units Subcutaneous TID WC  .  lactulose  30 g Oral TID  . metoprolol tartrate  12.5 mg Oral BID  . rifaximin  550 mg Oral BID  . spironolactone  50 mg Oral Daily   Continuous Infusions:    LOS: 3 days    Aarron Wierzbicki Tanna Furry, MD [facility] Pager [phone]  If 7PM-7AM, please contact night-coverage [email or web address] Password [email or web address] 06/29/2016, 1:09 PM

## 2016-06-29 NOTE — Progress Notes (Signed)
B/P is 91/64 and HR is 149. MD notified. MD stated to give metoprolol. MD stated he would call and talk with patient. Will continue to monitor.

## 2016-06-29 NOTE — Evaluation (Signed)
Physical Therapy Evaluation Patient Details Name: Natalie Lane MRN: [id number] DOB: 1945/09/26 Today's Date: 06/29/2016   History of Present Illness  Pt is a 71 y/o F with history of paroxysmal atrial tachycardia, cryptogenic cirrhosis with ascites and gastric varices s/p banding, previous paracentesis, presented with generalized abdominal pain and found to have tachycardia in the ER; Abdominal pain better after another paracentisis; aslo with acute kidney injury  Clinical Impression   Pt admitted with above diagnosis. Pt currently with functional limitations due to the deficits listed below (see PT Problem List). Overall moving well; minor unsteadiness with ambulation -- uses can well to compensate for this; more concerning is that her HR increased to in the 160's with her walk, and it sustained I the 150s sitting after walk;  Pt will benefit from skilled PT to increase their independence and safety with mobility to allow discharge to the venue listed below.       Follow Up Recommendations Home health PT    Equipment Recommendations  None recommended by PT    Recommendations for Other Services       Precautions / Restrictions Precautions Precaution Comments: watch HR Restrictions Weight Bearing Restrictions: No      Mobility  Bed Mobility                  Transfers   Equipment used: None Transfers: Sit to/from Stand Sit to Stand: Modified independent (Device/Increase time)            Ambulation/Gait Ambulation/Gait assistance: Supervision Ambulation Distance (Feet): 200 Feet Assistive device: None;Straight cane Gait Pattern/deviations: Step-through pattern;Decreased step length - right;Decreased step length - left     General Gait Details: Cues to self-monitor for activity tolerance; Tending to reach out for UE support from hallway rail, so walked back to room with cane; seems more confident with cane  Stairs            Wheelchair Mobility     Modified Rankin (Stroke Patients Only)       Balance                                             Pertinent Vitals/Pain Pain Assessment: No/denies pain Pain Intervention(s): Monitored during session    [place] expects to be discharged to:: Private residence Living Arrangements: Alone Available Help at Discharge: Friend(s);Available PRN/intermittently (Pt's boyfriend is currently visiting from [place]) Type of Home: Other(Comment) (Condo) Home Access: Stairs to enter Entrance Stairs-Rails: Right;Left Entrance Stairs-Number of Steps: 2 Home Layout: One level Home Equipment: Cane - single point;Grab bars - tub/shower;Bedside commode      Prior Function Level of Independence: Independent with assistive device(s)         Comments: uses SPC to get to the car     Hand Dominance   Dominant Hand: Right    Extremity/Trunk Assessment   Upper Extremity Assessment Upper Extremity Assessment: Overall WFL for tasks assessed    Lower Extremity Assessment Lower Extremity Assessment: Overall WFL for tasks assessed       Communication   Communication: No difficulties  Cognition Arousal/Alertness: Awake/alert Behavior During Therapy: WFL for tasks assessed/performed Overall Cognitive Status: Within Functional Limits for tasks assessed  General Comments      Exercises     Assessment/Plan    PT Assessment Patient needs continued PT services  PT Problem List Decreased activity tolerance;Decreased balance;Decreased mobility;Decreased knowledge of use of DME;Cardiopulmonary status limiting activity       PT Treatment Interventions DME instruction;Gait training;Stair training;Functional mobility training;Therapeutic activities;Therapeutic exercise;Balance training;Patient/family education    PT Goals (Current goals can be found in the Care Plan section)  Acute Rehab PT  Goals Patient Stated Goal: to regain her independence  PT Goal Formulation: With patient Time For Goal Achievement: 07/06/16 Potential to Achieve Goals: Good    Frequency Min 3X/week   Barriers to discharge        Co-evaluation               AM-PAC PT "6 Clicks" Daily Activity  Outcome Measure Difficulty turning over in bed (including adjusting bedclothes, sheets and blankets)?: None Difficulty moving from lying on back to sitting on the side of the bed? : None Difficulty sitting down on and standing up from a chair with arms (e.g., wheelchair, bedside commode, etc,.)?: None Help needed moving to and from a bed to chair (including a wheelchair)?: None Help needed walking in hospital room?: None Help needed climbing 3-5 steps with a railing? : A Little 6 Click Score: 23    End of Session Equipment Utilized During Treatment: Gait belt Activity Tolerance: Patient tolerated treatment well Patient left: in chair;with call bell/phone within reach;with nursing/sitter in room Nurse Communication: Mobility status PT Visit Diagnosis: Other abnormalities of gait and mobility (R26.89)    Time: 3790-2409 PT Time Calculation (min) (ACUTE ONLY): 32 min   Charges:   PT Evaluation $PT Eval Low Complexity: 1 Procedure PT Treatments $Gait Training: 8-22 mins   PT G Codes:        Roney Marion, PT  Acute Rehabilitation Services Pager [phone] Office [phone]   Natalie Lane 06/29/2016, 1:28 PM

## 2016-06-29 NOTE — Consult Note (Signed)
Cardiology Consult    Patient ID: ENYAH MOMAN MRN: [id number], DOB/AGE: 70-20-1947   Admit date: 06/25/2016 Date of Consult: 06/29/2016  Primary Physician: Carol Ada, MD Primary Cardiologist: P. Martinique, MD  Requesting Provider: Katheran James, MD  Patient Profile    Natalie Lane is a 71 y.o. female with a history of congenital heart dzs s/p septal defect repair in 1979, HFpEF, PAflutter s/p RFCA in 2011, cryptogenic cirrhosis s/p paracentesis, HL, DM, and gastric varices s/p banding, who is being seen today for the evaluation of SVT at the request of Dr. Carolin Sicks.  Past Medical History   Past Medical History:  Diagnosis Date  . Anemia   . Ascites   . CHD (congenital heart disease)    a. 1979 s/p septal defect repair (? VSD vs ASD), old op notes not available;  b. 04/2016 Echo: EF 60-65%, no rwma, Gr2 DD, mild AS, mild TR, no PFO (prev repaired).  . Chronic diastolic CHF (congestive heart failure) ([place])    04/2016 Echo: EF 60-65%, no rwma, Gr2 DD.  Marland Kitchen Complication of anesthesia   . Cryptogenic cirrhosis ([place])    a. 04/2016 s/p paracentesis.  . Diabetes mellitus, type 2 ([place])   . Dyspnea   . Edema   . Gastric varices    a. s/p banding.  Marland Kitchen GERD (gastroesophageal reflux disease)   . History of blood transfusion   . Hyperlipidemia   . Normal stress echocardiogram 2008  . Pancreatic adenoma   . Paroxysmal atrial flutter ([place])    a. 11/2009 s/p RFCA.  Marland Kitchen PONV (postoperative nausea and vomiting)    with Cholecystectomy  . Tobacco abuse    a. smokes 4 cigarettes/day.    Past Surgical History:  Procedure Laterality Date  . ABDOMINAL HYSTERECTOMY    . ASD versus VSD repair  1979   Operative notes not available.  . ATRIAL ABLATION SURGERY  10 /2011  . [place]   x2  . CHOLECYSTECTOMY    . ESOPHAGOGASTRODUODENOSCOPY (EGD) WITH PROPOFOL N/A 11/16/2013   Procedure: ESOPHAGOGASTRODUODENOSCOPY (EGD) WITH PROPOFOL;  Surgeon: Lear Ng, MD;  Location: [facility];  Service: Endoscopy;  Laterality: N/A;  . ESOPHAGOGASTRODUODENOSCOPY (EGD) WITH PROPOFOL N/A 01/23/2014   Procedure: ESOPHAGOGASTRODUODENOSCOPY (EGD) WITH PROPOFOL;  Surgeon: Lear Ng, MD;  Location: [facility];  Service: Endoscopy;  Laterality: N/A;  . GASTRIC VARICES BANDING N/A 11/16/2013   Procedure: GASTRIC VARICES BANDING;  Surgeon: Lear Ng, MD;  Location: [facility];  Service: Endoscopy;  Laterality: N/A;  . GASTRIC VARICES BANDING N/A 01/23/2014   Procedure: GASTRIC VARICES BANDING;  Surgeon: Lear Ng, MD;  Location: [facility];  Service: Endoscopy;  Laterality: N/A;  . HERNIA REPAIR    . IR GENERIC HISTORICAL  05/11/2016   IR PARACENTESIS 05/11/2016 Ascencion Dike, PA-C [facility]-INTERV RAD  . OTHER SURGICAL HISTORY     partial hysterectomy     Allergies  Allergies  Allergen Reactions  . Monosodium Glutamate Anaphylaxis    MSG  . Red Dye Anaphylaxis  . Erythromycin Other (See Comments)    Reaction:  Migraine   . Penicillins Other (See Comments)    Reaction:  Migraine  Has patient had a PCN reaction causing immediate rash, facial/tongue/throat swelling, SOB or lightheadedness with hypotension: No Has patient had a PCN reaction causing severe rash involving mucus membranes or skin necrosis: No Has patient had a PCN reaction that required hospitalization No Has patient had a PCN reaction occurring within  the last 10 years: No If all of the above answers are "NO", then may proceed with Cephalosporin use.  . Statins Other (See Comments)    Reaction:  Severe leg cramps   . Sulfa Antibiotics Hives  . Glipizide Rash  . Iohexol Rash  . Spironolactone Palpitations and Other (See Comments)    Sweating    History of Present Illness    71 y/o ? with the above complex pmh including congenital heart dzs s/p remote septal defect repair in 1979 w/ post-op atrial flutter and eventual flutter ablation in 2011, HFpEF, cryptogenic cirrhosis, gastric  varices s/p banding, DM II, and HL.  She was recently admitted in March with ascites and required paracentesis (5.5L).  She was noted to have intermittent atrial tachycardia and was advised to cont propranalol therapy.  She had been on  blocker since 1979 and had done well w/o palpitations until her admission in March.  Following discharge, she continued to note daily, intermittent, palpitations and fluttering assoc w/ weakness and dyspnea.  Tachypalps might last anywhere from a few secs to a few hrs.  Over the past month and a half, she has also noted progressive increase in abd girth and with that, she has had progressive DOE and orthopnea.  She denies c/p or edema.  She recently saw Dr. Michail Sermon, and given worsening ascites, recommendation was made for repeat admission and paracentesis.  This was performed on 5/12, removing 2L.  She was also noted to have AKI in the setting of diuretics, nsaids, and hypotn.  Creat has improved with holding of diuretics earlier in admission.  She has continued to have intermittent atrial tachycardia associated with weakness.  BPs have been very soft - often in the 80's, thus limiting options for titrating rate controlling meds.    Inpatient Medications    . enoxaparin (LOVENOX) injection  40 mg Subcutaneous Q24H  . feeding supplement (GLUCERNA SHAKE)  237 mL Oral BID BM  . furosemide  40 mg Oral Daily  . insulin aspart  0-5 Units Subcutaneous QHS  . insulin aspart  0-9 Units Subcutaneous TID WC  . lactulose  30 g Oral TID  . metoprolol tartrate  12.5 mg Oral BID  . rifaximin  550 mg Oral BID  . spironolactone  50 mg Oral Daily    Family History    Family History  Problem Relation Age of Onset  . Hypertension Mother   . Kidney disease Sister   . Depression Paternal Grandfather   . Diabetes Brother     Social History    Social History   Social History  . Marital status: Married    Spouse name: N/A  . Number of children: 3  . Years of education: N/A     Occupational History  . Office management [employer]   Social History Main Topics  . Smoking status: Current Every Day Smoker    Packs/day: 0.50    Years: 36.00    Types: Cigarettes  . Smokeless tobacco: Never Used     Comment: was smoking 4 cigs/day prior to admission.  . Alcohol use No  . Drug use: No  . Sexual activity: Not on file   Other Topics Concern  . Not on file   Social History Narrative   Lives in [place] with boyfriend.  Does not routinely exercise.     Review of Systems    General:  No chills, fever, night sweats or weight changes.  Cardiovascular:  No chest pain, +++  dyspnea on exertion, no edema, +++ orthopnea, +++ tachy palpitations, paroxysmal nocturnal dyspnea. Dermatological: No rash, lesions/masses Respiratory: No cough, dyspnea Urologic: No hematuria, dysuria Abdominal:   No nausea, vomiting, diarrhea, bright red blood per rectum, melena, or hematemesis.  Increasing abd girth/ascites. Neurologic:  No visual changes, wkns, changes in mental status. All other systems reviewed and are otherwise negative except as noted above.  Physical Exam    Blood pressure 91/64, pulse (!) 149, temperature 97.8 F (36.6 C), temperature source Oral, resp. rate 18, height 5\' 3"  (1.6 m), weight 195 lb (88.5 kg), SpO2 97 %.  General: Pleasant, NAD Psych: Normal affect. Neuro: Alert and oriented X 3. Moves all extremities spontaneously. HEENT: Normal  Neck: Supple without bruits.  JVP to 12. Lungs:  Resp regular and unlabored, CTA. Heart: RRR no s3, s4, 2/6 SEM RUSB  LLSB. Abdomen: Soft, distended, non-tender, BS + x 4.  Extremities: No clubbing, cyanosis or edema. DP/PT/Radials 2+ and equal bilaterally.  Labs    Lab Results  Component Value Date   WBC 5.8 06/26/2016   HGB 13.6 06/26/2016   HCT 40.9 06/26/2016   MCV 97.1 06/26/2016   PLT 240 06/26/2016     Recent Labs Lab 06/28/16 0558 06/29/16 0705  NA 136 137  K 4.0 4.7  CL 103 104  CO2 21* 24   BUN 29* 27*  CREATININE 1.32* 1.15*  CALCIUM 9.0 8.9  PROT 6.5  --   BILITOT 1.7*  --   ALKPHOS 33*  --   ALT 15  --   AST 23  --   GLUCOSE 127* 130*   Lab Results  Component Value Date   CHOL 287 (A) 02/28/2014   HDL 62 02/28/2014   LDLCALC 196 02/28/2014   TRIG 148 02/28/2014    Radiology Studies    Ct Abdomen Pelvis Wo Contrast  Result Date: 06/25/2016 CLINICAL DATA:  Ascites, abdominal distention for 1 month. Right lower quadrant abdominal pain. EXAM: CT ABDOMEN AND PELVIS WITHOUT CONTRAST TECHNIQUE: Multidetector CT imaging of the abdomen and pelvis was performed following the standard protocol without IV contrast. COMPARISON:  Most recent CT 07/02/2013 FINDINGS: Lower chest: Linear atelectasis in the lung bases, right greater than left. No pleural fluid. Normal heart size with left atrial enlargement. There are coronary artery calcifications. Hepatobiliary: Cirrhotic hepatic morphology with nodular contours. No discrete focal lesion on noncontrast exam. Clips in the gallbladder fossa postcholecystectomy. No biliary dilatation. Pancreas: No ductal dilatation or inflammation. Punctate calcification in the uncinate process is unchanged. Spleen: Normal in size without focal abnormality.  No splenomegaly. Adrenals/Urinary Tract: Normal adrenal glands. No hydronephrosis or perinephric edema. No urolithiasis. Urinary bladder is physiologically distended without wall thickening. Stomach/Bowel: Limited assessment secondary to lack of oral contrast on presence of intra-abdominal ascites. No evidence of obstruction. Colonic diverticulosis without inflammation. The appendix is not visualized. Small bowel is decompressed. Stomach is minimally distended. Probable perigastric varices. Vascular/Lymphatic: Aortic and branch atherosclerosis without aneurysm. Limited assessment for adenopathy given lack of contrast and presence of intra-abdominal ascites, no gross adenopathy is seen. Reproductive: Post  hysterectomy.  No evidence of adnexal mass. Other: Large volume of abdominopelvic ascites. Fluid measures simple density. No free air. There is associated mesenteric edema. Mild body wall edema anteriorly. Musculoskeletal: Unchanged bone island in the right acetabulum. There are no acute or suspicious osseous abnormalities. Degenerative disc disease and facet arthropathy in the lumbar spine. IMPRESSION: 1. Cirrhotic liver with large volume abdominopelvic ascites. 2. No definite acute abnormality apparent  3. Aortic and branch atherosclerosis. Electronically Signed   By: Jeb Levering M.D.   On: 06/25/2016 17:53   Dg Chest 2 View  Result Date: 06/25/2016 CLINICAL DATA:  Chronic shortness of breath. EXAM: CHEST  2 VIEW COMPARISON:  Radiographs of May 10, 2016. FINDINGS: Stable cardiomediastinal silhouette. Sternotomy wires are noted. Hypoinflation of the lungs is noted. Mild bibasilar subsegmental atelectasis is noted. No pneumothorax or pleural effusion is noted. Bony thorax is unremarkable. IMPRESSION: Hypoinflation of the lungs. Mild bibasilar subsegmental atelectasis. Electronically Signed   By: Marijo Conception, M.D.   On: 06/25/2016 15:36   US Paracentesis  Result Date: 06/26/2016 INDICATION: ascites EXAM: ULTRASOUND-GUIDED PARACENTESIS COMPARISON:  Previous paracentesis MEDICATIONS: 10 cc 1% lidocaine. COMPLICATIONS: None immediate. TECHNIQUE: Informed written consent was obtained from the patient after a discussion of the risks, benefits and alternatives to treatment. A timeout was performed prior to the initiation of the procedure. Initial ultrasound scanning demonstrates a large amount of ascites within the right lower abdominal quadrant. The right lower abdomen was prepped and draped in the usual sterile fashion. 1% lidocaine with epinephrine was used for local anesthesia. Under direct ultrasound guidance, a 19 gauge, 7-cm, Yueh catheter was introduced. An ultrasound image was saved for  documentation purposed. The paracentesis was performed. The catheter was removed and a dressing was applied. The patient tolerated the procedure well without immediate post procedural complication. FINDINGS: A total of approximately 2 liters maximum per MD of milky fluid was removed. Samples were sent to the laboratory as requested by the clinical team. IMPRESSION: Successful ultrasound-guided paracentesis yielding 2 liters maximum of peritoneal fluid. Read by Lavonia Drafts [facility] Electronically Signed   By: Jacqulynn Cadet M.D.   On: 06/26/2016 09:24    ECG & Cardiac Imaging    SVT, 159, ant TWI/ST dep - similar to prior ecg's.  Assessment & Plan    1.  Paroxysmal Atrial Tachycardia:  Pt w/ a h/o atrial flutter following septal defect repair in 1979.  She had done well w/o palpitations over the years while being maintained on oral  blocker therapy but since March, she has noted more frequent tachypalpitations.  She has had frequent and sometimes prolonged bouts of PAT since admission for recurrent ascites.  She has been relatively hypotensive throughout admission with periodic hypotension and pressures dropping in the 80's, thus limiting therapy for PAT.  K/Mg wnl.  F/u TSH.  We will add amiodarone 400 mg po daily (lft's wnl).  Cont  blocker if bp allows.  2.  Cryptogenic cirrhosis w/ recurrent ascites:  Per GI.  Paracentesis limited by hypotension.  3.  Hypotension:  Hold parameters on  blocker.  Hopefully she will tolerate PO amio for #1.    4.  AKI: creat stable @ 1.15.  5.  Hx atrial flutter: s/p RFCA in 2011.  Signed, Natalie Hodgkins, Natalie Lane 06/29/2016, 4:26 PM  Attending Note:   The patient was seen and examined.  Agree with assessment and plan as noted above.  Changes made to the above note as needed.  Patient seen and independently examined with Natalie Bayley, Natalie Lane.   We discussed all aspects of the encounter. I agree with the assessment and plan as stated above.  1. Atrial  tachycardia:  He presents with persistent severe ascites. She's in having an atrial tachycardia. Her blood pressure is quite low and his been difficult  to treat her with any heart rate slowing medications. Blood pressure has been  too low to  start diltiazem or metoprolol..  I think our best option at this point is to start amiodarone. She does have cryptogenic cirrhosis but her LFTs are normal. Start her on amiodarone 400 mg twice a day and follow her from there.   I have spent a total of 40 minutes with patient reviewing hospital  notes , telemetry, EKGs, labs and examining patient as well as establishing an assessment and plan that was discussed with the patient. > 50% of time was spent in direct patient care.    Thayer Headings, Brooke Bonito., MD, [facility] 06/29/2016, 6:53 PM [address],  [place] Pager [phone]

## 2016-06-29 NOTE — Progress Notes (Signed)
   06/29/16 0451  Vitals  Temp 99.1 F (37.3 C)  Temp Source Oral  BP 96/76  MAP (mmHg) 79  BP Location Right Arm  BP Method Automatic  Patient Position (if appropriate) Sitting  Pulse Rate (!) 165  Pulse Rate Source Dinamap  ECG Heart Rate (!) 165  Cardiac Rhythm ST  Ectopy Frequency Frequent  New onset of dysrhythmia? No  Resp 20  NP made aware. Awaiting return call.

## 2016-06-29 NOTE — Progress Notes (Signed)
Subjective:  The patient was seen and examined at bedside. She reports discomfort in the right and left lower quadrant. She did not have a bowel movement yesterday or today. She has noticed an increase in the urine output since restarting diuretics yesterday.  Objective: Vital signs in last 24 hours: Temp:  [97.8 F (36.6 C)-99.1 F (37.3 C)] 97.8 F (36.6 C) (05/15 0919) Pulse Rate:  [81-165] 81 (05/15 0919) Resp:  [17-20] 18 (05/15 0919) BP: (85-113)/(48-76) 87/55 (05/15 0919) SpO2:  [94 %-99 %] 97 % (05/15 0919) Weight:  [88.5 kg (195 lb)] 88.5 kg (195 lb) (05/14 2143) Weight change: 0.544 kg (1 lb 3.2 oz) Last BM Date: 06/27/16  PE: Not in acute distress, mild pallor, no obvious icterus GENERAL: Appears comfortable, able to speak in full sentences not using accessory muscles of respiration ABDOMEN: Remains distended, tense, nontender, normoactive bowel sounds EXTREMITIES: No edema noted  Lab Results: Results for orders placed or performed during the hospital encounter of 06/25/16 (from the past 48 hour(s))  Glucose, capillary     Status: Abnormal   Collection Time: 06/27/16 11:53 AM  Result Value Ref Range   Glucose-Capillary 249 (H) 65 - 99 mg/dL  Glucose, capillary     Status: Abnormal   Collection Time: 06/27/16  4:36 PM  Result Value Ref Range   Glucose-Capillary 218 (H) 65 - 99 mg/dL  Glucose, capillary     Status: Abnormal   Collection Time: 06/27/16  9:47 PM  Result Value Ref Range   Glucose-Capillary 165 (H) 65 - 99 mg/dL  Comprehensive metabolic panel     Status: Abnormal   Collection Time: 06/28/16  5:58 AM  Result Value Ref Range   Sodium 136 135 - 145 mmol/L   Potassium 4.0 3.5 - 5.1 mmol/L   Chloride 103 101 - 111 mmol/L   CO2 21 (L) 22 - 32 mmol/L   Glucose, Bld 127 (H) 65 - 99 mg/dL   BUN 29 (H) 6 - 20 mg/dL   Creatinine, Ser 1.32 (H) 0.44 - 1.00 mg/dL   Calcium 9.0 8.9 - 10.3 mg/dL   Total Protein 6.5 6.5 - 8.1 g/dL   Albumin 4.2 3.5 - 5.0 g/dL   AST 23 15 - 41 U/L   ALT 15 14 - 54 U/L   Alkaline Phosphatase 33 (L) 38 - 126 U/L   Total Bilirubin 1.7 (H) 0.3 - 1.2 mg/dL   GFR calc non Af Amer 40 (L) >60 mL/min   GFR calc Af Amer 47 (L) >60 mL/min    Comment: (NOTE) The eGFR has been calculated using the CKD EPI equation. This calculation has not been validated in all clinical situations. eGFR's persistently <60 mL/min signify possible Chronic Kidney Disease.    Anion gap 12 5 - 15  Glucose, capillary     Status: Abnormal   Collection Time: 06/28/16  7:42 AM  Result Value Ref Range   Glucose-Capillary 131 (H) 65 - 99 mg/dL  Magnesium     Status: None   Collection Time: 06/28/16  8:30 AM  Result Value Ref Range   Magnesium 1.9 1.7 - 2.4 mg/dL  Glucose, capillary     Status: Abnormal   Collection Time: 06/28/16 11:48 AM  Result Value Ref Range   Glucose-Capillary 200 (H) 65 - 99 mg/dL  Glucose, capillary     Status: Abnormal   Collection Time: 06/28/16  5:28 PM  Result Value Ref Range   Glucose-Capillary 200 (H) 65 - 99 mg/dL  Glucose,  capillary     Status: Abnormal   Collection Time: 06/28/16  8:47 PM  Result Value Ref Range   Glucose-Capillary 186 (H) 65 - 99 mg/dL  Basic metabolic panel     Status: Abnormal   Collection Time: 06/29/16  7:05 AM  Result Value Ref Range   Sodium 137 135 - 145 mmol/L   Potassium 4.7 3.5 - 5.1 mmol/L   Chloride 104 101 - 111 mmol/L   CO2 24 22 - 32 mmol/L   Glucose, Bld 130 (H) 65 - 99 mg/dL   BUN 27 (H) 6 - 20 mg/dL   Creatinine, Ser 1.15 (H) 0.44 - 1.00 mg/dL   Calcium 8.9 8.9 - 10.3 mg/dL   GFR calc non Af Amer 47 (L) >60 mL/min   GFR calc Af Amer 55 (L) >60 mL/min    Comment: (NOTE) The eGFR has been calculated using the CKD EPI equation. This calculation has not been validated in all clinical situations. eGFR's persistently <60 mL/min signify possible Chronic Kidney Disease.    Anion gap 9 5 - 15  Glucose, capillary     Status: Abnormal   Collection Time: 06/29/16  7:39 AM   Result Value Ref Range   Glucose-Capillary 139 (H) 65 - 99 mg/dL    Studies/Results: No results found.  Medications: I have reviewed the patient's current medications.  Assessment: Decompensated cirrhosis with ascites, impaired renal function, status post paracentesis  Plan: Currently on Lasix 40 mg daill and spironolactone 50 mg a day, improvement in creatinine (2.1 on admission 1.15 today), improvement in GFR (23 on admission and 47 today), improvement in BUN (47 on admission and 27 today). Hemodynamically stable. Recommend discharge home on 40 mg Lasix and 50 mg spironolactone and follow up with Dr. Wilford Corner as an outpatient in 1 week. We will give lactulose 30 g by mouth 1 dose as patient has not had a bowel movement since yesterday. As an outpatient advised to continue lactulose 3 times a day and titrate accordingly in order to have 3-5 bowel movements a day. Continue Xifaxan 550 mg by mouth twice a day, for history of hepatic encephalopathy. Advised to continue 2 g sodium diet even at home. Advised to monitor weight on a regular basis at home.    Ronnette Juniper 06/29/2016, 9:26 AM   Pager [phone] If no answer or after 5 PM call [phone]

## 2016-06-30 DIAGNOSIS — K7290 Hepatic failure, unspecified without coma: Secondary | ICD-10-CM

## 2016-06-30 DIAGNOSIS — N179 Acute kidney failure, unspecified: Secondary | ICD-10-CM

## 2016-06-30 DIAGNOSIS — K7469 Other cirrhosis of liver: Principal | ICD-10-CM

## 2016-06-30 DIAGNOSIS — I471 Supraventricular tachycardia: Secondary | ICD-10-CM

## 2016-06-30 DIAGNOSIS — R188 Other ascites: Secondary | ICD-10-CM

## 2016-06-30 LAB — ANAEROBIC CULTURE

## 2016-06-30 LAB — BASIC METABOLIC PANEL
ANION GAP: 10 (ref 5–15)
BUN: 23 mg/dL — ABNORMAL HIGH (ref 6–20)
CO2: 23 mmol/L (ref 22–32)
Calcium: 9 mg/dL (ref 8.9–10.3)
Chloride: 101 mmol/L (ref 101–111)
Creatinine, Ser: 1.21 mg/dL — ABNORMAL HIGH (ref 0.44–1.00)
GFR calc non Af Amer: 45 mL/min — ABNORMAL LOW (ref >60)
GFR, EST AFRICAN AMERICAN: 52 mL/min — AB (ref >60)
Glucose, Bld: 169 mg/dL — ABNORMAL HIGH (ref 65–99)
Potassium: 4.3 mmol/L (ref 3.5–5.1)
Sodium: 134 mmol/L — ABNORMAL LOW (ref 135–145)

## 2016-06-30 LAB — GLUCOSE, CAPILLARY
GLUCOSE-CAPILLARY: 226 mg/dL — AB (ref 65–99)
Glucose-Capillary: 164 mg/dL — ABNORMAL HIGH (ref 65–99)

## 2016-06-30 MED ORDER — AMIODARONE HCL 200 MG PO TABS: 400.0000 mg | ORAL_TABLET | Freq: Two times a day (BID) | ORAL | Status: DC

## 2016-06-30 MED ORDER — AMIODARONE HCL 200 MG PO TABS: 200.0000 mg | ORAL_TABLET | Freq: Every day | ORAL | Status: DC

## 2016-06-30 MED ORDER — AMIODARONE HCL 200 MG PO TABS: 200.0000 mg | ORAL_TABLET | Freq: Every day | ORAL | 2 refills | Status: DC

## 2016-06-30 MED ORDER — AMIODARONE HCL 400 MG PO TABS: 400.0000 mg | ORAL_TABLET | Freq: Two times a day (BID) | ORAL | 0 refills | Status: DC

## 2016-06-30 MED ORDER — AMIODARONE HCL 200 MG PO TABS: 200.0000 mg | ORAL_TABLET | Freq: Two times a day (BID) | ORAL | Status: DC

## 2016-06-30 MED ORDER — AMIODARONE HCL 200 MG PO TABS: 200.0000 mg | ORAL_TABLET | Freq: Two times a day (BID) | ORAL | 0 refills | Status: DC

## 2016-06-30 MED ORDER — METOPROLOL TARTRATE 25 MG PO TABS: 12.5000 mg | ORAL_TABLET | Freq: Two times a day (BID) | ORAL | 2 refills | Status: DC

## 2016-06-30 NOTE — Progress Notes (Signed)
Natalie Lane to be D/C'd Home per MD order.  Discussed prescriptions and follow up appointments with the patient. Prescriptions given to patient, medication list explained in detail. Pt verbalized understanding.  Allergies as of 06/30/2016      Reactions   Monosodium Glutamate Anaphylaxis   MSG   Red Dye Anaphylaxis   Erythromycin Other (See Comments)   Reaction:  Migraine    Penicillins Other (See Comments)   Reaction:  Migraine  Has patient had a PCN reaction causing immediate rash, facial/tongue/throat swelling, SOB or lightheadedness with hypotension: No Has patient had a PCN reaction causing severe rash involving mucus membranes or skin necrosis: No Has patient had a PCN reaction that required hospitalization No Has patient had a PCN reaction occurring within the last 10 years: No If all of the above answers are "NO", then may proceed with Cephalosporin use.   Statins Other (See Comments)   Reaction:  Severe leg cramps    Sulfa Antibiotics Hives   Glipizide Rash   Iohexol Rash   Spironolactone Palpitations, Other (See Comments)   Sweating      Medication List    STOP taking these medications   ibuprofen 200 MG tablet Commonly known as:  ADVIL,MOTRIN   propranolol 20 MG tablet Commonly known as:  INDERAL     TAKE these medications   amiodarone 400 MG tablet Commonly known as:  PACERONE Take 1 tablet (400 mg total) by mouth 2 (two) times daily. Notes to patient:  Take 400 mg tablets twice daily for 2 weeks.    amiodarone 200 MG tablet Commonly known as:  PACERONE Take 1 tablet (200 mg total) by mouth 2 (two) times daily. Start taking on:  07/07/2016   amiodarone 200 MG tablet Commonly known as:  PACERONE Take 1 tablet (200 mg total) by mouth daily. Start taking on:  07/22/2016 Notes to patient:  After you finish the 200 mg tablets twice daily for 2 weeks, start taking 200 mg tablet once daily.    diphenhydrAMINE 25 MG tablet Commonly known as:  BENADRYL Take 25  mg by mouth every 6 (six) hours as needed for allergies.   docusate sodium 100 MG capsule Commonly known as:  COLACE Take 100 mg by mouth 2 (two) times daily as needed for mild constipation.   furosemide 40 MG tablet Commonly known as:  LASIX Take 1 tablet (40 mg total) by mouth daily. What changed:  how much to take   glimepiride 2 MG tablet Commonly known as:  AMARYL Take 1 tablet (2 mg total) by mouth 2 (two) times daily before a meal. What changed:  when to take this   insulin NPH Human 100 UNIT/ML injection Commonly known as:  HUMULIN N,NOVOLIN N ReliOn brand - Pt uses 25 units in the morning and 15 units at bedtime. What changed:  how much to take  how to take this  additional instructions   Insulin Syringe-Needle U-100 31G X 15/64" 0.3 ML Misc Commonly known as:  RELION INSULIN SYRINGE Use to inject insulin two times daily   lactulose 10 GM/15ML solution Commonly known as:  CHRONULAC Take 10 g by mouth 2 (two) times daily.   loperamide 2 MG tablet Commonly known as:  IMODIUM A-D Take 2 mg by mouth 4 (four) times daily as needed for diarrhea or loose stools.   metFORMIN 500 MG 24 hr tablet Commonly known as:  GLUCOPHAGE-XR Take 1 tablet (500 mg total) by mouth 2 (two) times daily with a  meal.   metoprolol tartrate 25 MG tablet Commonly known as:  LOPRESSOR Take 0.5 tablets (12.5 mg total) by mouth 2 (two) times daily.   ranitidine 150 MG capsule Commonly known as:  ZANTAC Take 150 mg by mouth daily as needed for heartburn.   rifaximin 550 MG Tabs tablet Commonly known as:  XIFAXAN Take 550 mg by mouth 2 (two) times daily.   simethicone 80 MG chewable tablet Commonly known as:  MYLICON Chew 80 mg by mouth 4 (four) times daily as needed for flatulence.   spironolactone 100 MG tablet Commonly known as:  ALDACTONE Take 1 tablet (100 mg total) by mouth daily. What changed:  how much to take       Vitals:   06/30/16 0934 06/30/16 1130  BP: (!)  94/56 (!) 109/51  Pulse: 96 (!) 115  Resp: 18   Temp: 98.7 F (37.1 C)     Skin clean, dry and intact without evidence of skin break down, no evidence of skin tears noted. IV catheter discontinued intact. Site without signs and symptoms of complications. Dressing and pressure applied. Pt denies pain at this time. No complaints noted.  An After Visit Summary was printed and given to the patient. Patient escorted via [place], and D/C home via private auto.  Dixie Dials RN, BSN

## 2016-06-30 NOTE — Discharge Summary (Signed)
Physician Discharge Summary  Natalie Lane QZR:[id number] DOB: 1945/03/05 DOA: 06/25/2016  PCP: Carol Ada, MD  Admit date: 06/25/2016 Discharge date: 06/30/2016  Time spent: 35* minutes  Recommendations for Outpatient Follow-up:  1. *Follow up cardiology in 2 weeks   Discharge Diagnoses:  Principal Problem:   Decompensated hepatic cirrhosis ([place]) Active Problems:   Ascites   PAT (paroxysmal atrial tachycardia) ([facility])   ARF (acute renal failure) ([facility])   Tobacco use   AKI (acute kidney injury) ([place])   Decompensated liver disease ([place])   Discharge Condition: Stable  Diet recommendation: Carb modified diet  Filed Weights   06/26/16 2053 06/27/16 2150 06/28/16 2143  Weight: 87.1 kg (192 lb 1.6 oz) 87.9 kg (193 lb 12.8 oz) 88.5 kg (195 lb)    History of present illness:  71 year old female with history of paroxysmal atrial tachycardia, cryptogenic cirrhosis with ascites and gastric varices status post banding by Dr. Michail Sermon, recently increased the dose of diuretics, had a paracentesis in the past presented with generalized abdominal pain. In the ER patient was found to have tachycardia, history of present illness with elevated serum creatinine level of 2.1 and admitted for further evaluation.  Hospital Course:   Decompensated liver cirrhosis with ascites- patient underwent ultrasound-guided paracentesis with removal of 2 L of ascitic fluid. Fluid analysis was negative for SBP. SAAG >1.1 consistent with portal hypertension. Patient had mild elevation of LDH which is stable compared to prior ascitic fluid LDH. GI recommends checking fluid triglyceride during next paracentesis to rule out chylous ascites.  Atrial tachycardia- patient had atrial tachycardia during hospitalization was started on IV metoprolol. Cardiology was consulted, and patient started on amiodarone tapering dose with recommendations as in the med reconciliation, amiodarone 400 mg twice a day for 1 week, then  amiodarone 200 mg twice a day for 2 weeks, then amiodarone 200 mg daily. Patient to follow-up with cardiology as outpatient in 2 weeks. I also called and confirmed with Dr. Acie Fredrickson, that patient can be discharged on metoprolol 12.5 mg by mouth twice a day along with amiodarone.  Acute kidney injury- patient's BUN/creatinine improved today 23/1.21, likely in the setting of diuretics liver disease and use of NSAIDs. Patient educated to avoid NSAIDs  Diabetes mellitus- continue metformin, Amaryl, NPH insulin 25 units in the morning and 15 units at bedtime.  Hypotension- patient started on low-dose beta blocker metoprolol 12.5 mg twice a day, confirmed with cardiology. Closely monitor patient's blood pressure as outpatient  Procedures:  Ultrasound-guided paracentesis  Consultations:  Cardiology  GI  Discharge Exam: Vitals:   06/30/16 0934 06/30/16 1130  BP: (!) 94/56 (!) 109/51  Pulse: 96 (!) 115  Resp: 18   Temp: 98.7 F (37.1 C)     General: Appears in no acute distress Cardiovascular: RRR, S1S2 Respiratory: Clear to auscultation bilaterally  Discharge Instructions   Discharge Instructions    Diet - low sodium heart healthy    Complete by:  As directed    Increase activity slowly    Complete by:  As directed      Current Discharge Medication List    START taking these medications   Details  !! amiodarone (PACERONE) 200 MG tablet Take 1 tablet (200 mg total) by mouth 2 (two) times daily. Qty: 28 tablet, Refills: 0    !! amiodarone (PACERONE) 200 MG tablet Take 1 tablet (200 mg total) by mouth daily. Qty: 30 tablet, Refills: 2    !! amiodarone (PACERONE) 400 MG tablet Take 1 tablet (400  mg total) by mouth 2 (two) times daily. Qty: 12 tablet, Refills: 0    metoprolol tartrate (LOPRESSOR) 25 MG tablet Take 0.5 tablets (12.5 mg total) by mouth 2 (two) times daily. Qty: 60 tablet, Refills: 2     !! - Potential duplicate medications found. Please discuss with provider.     CONTINUE these medications which have NOT CHANGED   Details  diphenhydrAMINE (BENADRYL) 25 MG tablet Take 25 mg by mouth every 6 (six) hours as needed for allergies.    docusate sodium (COLACE) 100 MG capsule Take 100 mg by mouth 2 (two) times daily as needed for mild constipation.    furosemide (LASIX) 40 MG tablet Take 1 tablet (40 mg total) by mouth daily. Qty: 30 tablet, Refills: 0    glimepiride (AMARYL) 2 MG tablet Take 1 tablet (2 mg total) by mouth 2 (two) times daily before a meal. Qty: 180 tablet, Refills: 3    insulin NPH Human (HUMULIN N,NOVOLIN N) 100 UNIT/ML injection ReliOn brand - Pt uses 25 units in the morning and 15 units at bedtime.    Insulin Syringe-Needle U-100 (RELION INSULIN SYRINGE) 31G X 15/64" 0.3 ML MISC Use to inject insulin two times daily Qty: 200 each, Refills: 3    lactulose (CHRONULAC) 10 GM/15ML solution Take 10 g by mouth 2 (two) times daily.     loperamide (IMODIUM A-D) 2 MG tablet Take 2 mg by mouth 4 (four) times daily as needed for diarrhea or loose stools.    metFORMIN (GLUCOPHAGE-XR) 500 MG 24 hr tablet Take 1 tablet (500 mg total) by mouth 2 (two) times daily with a meal. Qty: 180 tablet, Refills: 1    ranitidine (ZANTAC) 150 MG capsule Take 150 mg by mouth daily as needed for heartburn.    simethicone (MYLICON) 80 MG chewable tablet Chew 80 mg by mouth 4 (four) times daily as needed for flatulence.    spironolactone (ALDACTONE) 100 MG tablet Take 1 tablet (100 mg total) by mouth daily. Qty: 30 tablet, Refills: 0    rifaximin (XIFAXAN) 550 MG TABS tablet Take 550 mg by mouth 2 (two) times daily.      STOP taking these medications     ibuprofen (ADVIL,MOTRIN) 200 MG tablet      propranolol (INDERAL) 20 MG tablet        Allergies  Allergen Reactions  . Monosodium Glutamate Anaphylaxis    MSG  . Red Dye Anaphylaxis  . Erythromycin Other (See Comments)    Reaction:  Migraine   . Penicillins Other (See Comments)     Reaction:  Migraine  Has patient had a PCN reaction causing immediate rash, facial/tongue/throat swelling, SOB or lightheadedness with hypotension: No Has patient had a PCN reaction causing severe rash involving mucus membranes or skin necrosis: No Has patient had a PCN reaction that required hospitalization No Has patient had a PCN reaction occurring within the last 10 years: No If all of the above answers are "NO", then may proceed with Cephalosporin use.  . Statins Other (See Comments)    Reaction:  Severe leg cramps   . Sulfa Antibiotics Hives  . Glipizide Rash  . Iohexol Rash  . Spironolactone Palpitations and Other (See Comments)    Sweating      The results of significant diagnostics from this hospitalization (including imaging, microbiology, ancillary and laboratory) are listed below for reference.    Significant Diagnostic Studies: Ct Abdomen Pelvis Wo Contrast  Result Date: 06/25/2016 CLINICAL DATA:  Ascites, abdominal  distention for 1 month. Right lower quadrant abdominal pain. EXAM: CT ABDOMEN AND PELVIS WITHOUT CONTRAST TECHNIQUE: Multidetector CT imaging of the abdomen and pelvis was performed following the standard protocol without IV contrast. COMPARISON:  Most recent CT 07/02/2013 FINDINGS: Lower chest: Linear atelectasis in the lung bases, right greater than left. No pleural fluid. Normal heart size with left atrial enlargement. There are coronary artery calcifications. Hepatobiliary: Cirrhotic hepatic morphology with nodular contours. No discrete focal lesion on noncontrast exam. Clips in the gallbladder fossa postcholecystectomy. No biliary dilatation. Pancreas: No ductal dilatation or inflammation. Punctate calcification in the uncinate process is unchanged. Spleen: Normal in size without focal abnormality.  No splenomegaly. Adrenals/Urinary Tract: Normal adrenal glands. No hydronephrosis or perinephric edema. No urolithiasis. Urinary bladder is physiologically distended  without wall thickening. Stomach/Bowel: Limited assessment secondary to lack of oral contrast on presence of intra-abdominal ascites. No evidence of obstruction. Colonic diverticulosis without inflammation. The appendix is not visualized. Small bowel is decompressed. Stomach is minimally distended. Probable perigastric varices. Vascular/Lymphatic: Aortic and branch atherosclerosis without aneurysm. Limited assessment for adenopathy given lack of contrast and presence of intra-abdominal ascites, no gross adenopathy is seen. Reproductive: Post hysterectomy.  No evidence of adnexal mass. Other: Large volume of abdominopelvic ascites. Fluid measures simple density. No free air. There is associated mesenteric edema. Mild body wall edema anteriorly. Musculoskeletal: Unchanged bone island in the right acetabulum. There are no acute or suspicious osseous abnormalities. Degenerative disc disease and facet arthropathy in the lumbar spine. IMPRESSION: 1. Cirrhotic liver with large volume abdominopelvic ascites. 2. No definite acute abnormality apparent 3. Aortic and branch atherosclerosis. Electronically Signed   By: Jeb Levering M.D.   On: 06/25/2016 17:53   Dg Chest 2 View  Result Date: 06/25/2016 CLINICAL DATA:  Chronic shortness of breath. EXAM: CHEST  2 VIEW COMPARISON:  Radiographs of May 10, 2016. FINDINGS: Stable cardiomediastinal silhouette. Sternotomy wires are noted. Hypoinflation of the lungs is noted. Mild bibasilar subsegmental atelectasis is noted. No pneumothorax or pleural effusion is noted. Bony thorax is unremarkable. IMPRESSION: Hypoinflation of the lungs. Mild bibasilar subsegmental atelectasis. Electronically Signed   By: Marijo Conception, M.D.   On: 06/25/2016 15:36   US Paracentesis  Result Date: 06/26/2016 INDICATION: ascites EXAM: ULTRASOUND-GUIDED PARACENTESIS COMPARISON:  Previous paracentesis MEDICATIONS: 10 cc 1% lidocaine. COMPLICATIONS: None immediate. TECHNIQUE: Informed written  consent was obtained from the patient after a discussion of the risks, benefits and alternatives to treatment. A timeout was performed prior to the initiation of the procedure. Initial ultrasound scanning demonstrates a large amount of ascites within the right lower abdominal quadrant. The right lower abdomen was prepped and draped in the usual sterile fashion. 1% lidocaine with epinephrine was used for local anesthesia. Under direct ultrasound guidance, a 19 gauge, 7-cm, Yueh catheter was introduced. An ultrasound image was saved for documentation purposed. The paracentesis was performed. The catheter was removed and a dressing was applied. The patient tolerated the procedure well without immediate post procedural complication. FINDINGS: A total of approximately 2 liters maximum per MD of milky fluid was removed. Samples were sent to the laboratory as requested by the clinical team. IMPRESSION: Successful ultrasound-guided paracentesis yielding 2 liters maximum of peritoneal fluid. Read by Lavonia Drafts [facility] Electronically Signed   By: Jacqulynn Cadet M.D.   On: 06/26/2016 09:24    Microbiology: Recent Results (from the past 240 hour(s))  Anaerobic culture     Status: None   Collection Time: 06/26/16  9:22 AM  Result Value Ref Range Status   Specimen Description FLUID PERITONEAL  Final   Special Requests NONE  Final   Culture NO ANAEROBES ISOLATED  Final   Report Status 06/30/2016 FINAL  Final  Body fluid culture     Status: None   Collection Time: 06/26/16  9:22 AM  Result Value Ref Range Status   Specimen Description FLUID PERITONEAL  Final   Special Requests NONE  Final   Gram Stain   Final    MODERATE WBC PRESENT, PREDOMINANTLY MONONUCLEAR NO ORGANISMS SEEN    Culture NO GROWTH 3 DAYS  Final   Report Status 06/29/2016 FINAL  Final  Urine culture     Status: Abnormal   Collection Time: 06/26/16  6:30 PM  Result Value Ref Range Status   Specimen Description URINE, RANDOM  Final    Special Requests NONE  Final   Culture >=100,000 COLONIES/mL ENTEROBACTER AEROGENES (A)  Final   Report Status 06/28/2016 FINAL  Final   Organism ID, Bacteria ENTEROBACTER AEROGENES (A)  Final      Susceptibility   Enterobacter aerogenes - MIC*    CEFAZOLIN >=64 RESISTANT Resistant     CEFTRIAXONE <=1 SENSITIVE Sensitive     CIPROFLOXACIN <=0.25 SENSITIVE Sensitive     GENTAMICIN <=1 SENSITIVE Sensitive     IMIPENEM 1 SENSITIVE Sensitive     NITROFURANTOIN 64 INTERMEDIATE Intermediate     TRIMETH/SULFA <=20 SENSITIVE Sensitive     PIP/TAZO <=4 SENSITIVE Sensitive     * >=100,000 COLONIES/mL ENTEROBACTER AEROGENES     Labs: Basic Metabolic Panel:  Recent Labs Lab 06/26/16 0640 06/27/16 0513 06/28/16 0558 06/28/16 0830 06/29/16 0705 06/30/16 0831  NA 136 136 136  --  137 134*  K 4.1 4.1 4.0  --  4.7 4.3  CL 102 103 103  --  104 101  CO2 23 21* 21*  --  24 23  GLUCOSE 148* 110* 127*  --  130* 169*  BUN 43* 35* 29*  --  27* 23*  CREATININE 1.89* 1.54* 1.32*  --  1.15* 1.21*  CALCIUM 9.2 9.4 9.0  --  8.9 9.0  MG  --   --   --  1.9  --   --    Liver Function Tests:  Recent Labs Lab 06/25/16 1658 06/27/16 0513 06/28/16 0558  AST 27 22 23   ALT 18 12* 15  ALKPHOS 45 29* 33*  BILITOT 1.1 1.8* 1.7*  PROT 7.2 6.4* 6.5  ALBUMIN 3.2* 4.7 4.2   No results for input(s): LIPASE, AMYLASE in the last 168 hours. No results for input(s): AMMONIA in the last 168 hours. CBC:  Recent Labs Lab 06/25/16 1428 06/26/16 0640  WBC 8.1 5.8  NEUTROABS  --  3.5  HGB 14.1 13.6  HCT 41.6 40.9  MCV 95.9 97.1  PLT 272 240   Cardiac Enzymes: No results for input(s): CKTOTAL, CKMB, CKMBINDEX, TROPONINI in the last 168 hours. BNP: BNP (last 3 results)  Recent Labs  04/26/16 1308 05/10/16 1431 06/25/16 1658  BNP 96.5 114.7* 148.4*    ProBNP (last 3 results) No results for input(s): PROBNP in the last 8760 hours.  CBG:  Recent Labs Lab 06/29/16 1203 06/29/16 1700  06/29/16 2236 06/30/16 0813 06/30/16 1144  GLUCAP 201* 184* 197* 164* 226*       Signed:  Oswald Hillock MD.  [facility] 06/30/2016, 12:21 PM

## 2016-06-30 NOTE — Progress Notes (Signed)
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, previous paracentesis, presented with generalized abdominal pain and found to have tachycardia in the ER; Abdominal pain better after another paracentisis; aslo with acute kidney injury    PT Comments    Much improved activity tolerance compared to yesterday;  HR range 84- 95 bpm with amb  Follow Up Recommendations  Home health PT     Equipment Recommendations  None recommended by PT    Recommendations for Other Services       Precautions / Restrictions Precautions Precaution Comments: watch HR    Mobility  Bed Mobility Overal bed mobility: Modified Independent                Transfers   Equipment used: None Transfers: Sit to/from Stand Sit to Stand: Modified independent (Device/Increase time)            Ambulation/Gait Ambulation/Gait assistance: Supervision Ambulation Distance (Feet): 200 Feet Assistive device: Straight cane Gait Pattern/deviations: Step-through pattern;Decreased step length - right;Decreased step length - left     General Gait Details: Cues to self-monitor for activity tolerance; Tending to reach out for UE support from hallway rail, so walked back to room with cane; seems more confident with cane   Stairs            Wheelchair Mobility    Modified Rankin (Stroke Patients Only)       Balance                                            Cognition Arousal/Alertness: Awake/alert Behavior During Therapy: WFL for tasks assessed/performed Overall Cognitive Status: Within Functional Limits for tasks assessed                                        Exercises      General Comments        Pertinent  Vitals/Pain Pain Assessment: No/denies pain    Home Living                      Prior Function            PT Goals (current goals can now be found in the care plan section) Acute Rehab PT Goals Patient Stated Goal: to regain her independence  PT Goal Formulation: With patient Time For Goal Achievement: 07/06/16 Potential to Achieve Goals: Good Progress towards PT goals: Progressing toward goals    Frequency    Min 3X/week      PT Plan Current plan remains appropriate    Co-evaluation              AM-PAC PT "6 Clicks" Daily Activity  Outcome Measure  Difficulty turning over in bed (including adjusting bedclothes, sheets and blankets)?: None Difficulty moving from lying on back to sitting on the side of the bed? : None Difficulty sitting down on and standing up from a chair with arms (e.g., wheelchair, bedside commode, etc,.)?: None Help needed moving to and from a bed to chair (including a wheelchair)?: None Help needed walking in hospital  room?: None Help needed climbing 3-5 steps with a railing? : A Little 6 Click Score: 23    End of Session Equipment Utilized During Treatment: Gait belt Activity Tolerance: Patient tolerated treatment well Patient left: in chair;with call bell/phone within reach Nurse Communication: Mobility status PT Visit Diagnosis: Other abnormalities of gait and mobility (R26.89)     Time: 4458-4835 PT Time Calculation (min) (ACUTE ONLY): 14 min  Charges:  $Gait Training: 8-22 mins                    G Codes:       Roney Marion, PT  [place] Pager [phone] Office [phone]    Colletta [place] 06/30/2016, 4:58 PM

## 2016-06-30 NOTE — Progress Notes (Addendum)
Progress Note  Patient Name: Natalie Lane Date of Encounter: 06/30/2016  Primary Cardiologist: Martinique  Subjective   Pt was admitted with ascites due to cryptogenic cirrhosis. Has had sinus tach / atrial tach. BP has been low so we are trying amiodarone    Inpatient Medications    Scheduled Meds: . amiodarone  400 mg Oral BID  . enoxaparin (LOVENOX) injection  40 mg Subcutaneous Q24H  . feeding supplement (GLUCERNA SHAKE)  237 mL Oral BID BM  . furosemide  40 mg Oral Daily  . insulin aspart  0-5 Units Subcutaneous QHS  . insulin aspart  0-9 Units Subcutaneous TID WC  . lactulose  30 g Oral TID  . metoprolol tartrate  12.5 mg Oral BID  . rifaximin  550 mg Oral BID  . spironolactone  50 mg Oral Daily   Continuous Infusions:  PRN Meds: ipratropium-albuterol, metoprolol tartrate, oxyCODONE   Vital Signs    Vitals:   06/29/16 1015 06/29/16 1822 06/29/16 2131 06/30/16 0934  BP: 91/64 (!) 99/53 (!) 95/56 (!) 94/56  Pulse: (!) 149 88  96  Resp:  16 18 18   Temp:  97.4 F (36.3 C) 98.4 F (36.9 C) 98.7 F (37.1 C)  TempSrc:  Oral Oral Oral  SpO2:  97% 97% 100%  Weight:      Height:        Intake/Output Summary (Last 24 hours) at 06/30/16 1027 Last data filed at 06/30/16 0102  Gross per 24 hour  Intake              240 ml  Output                0 ml  Net              240 ml   Filed Weights   06/26/16 2053 06/27/16 2150 06/28/16 2143  Weight: 192 lb 1.6 oz (87.1 kg) 193 lb 12.8 oz (87.9 kg) 195 lb (88.5 kg)    Telemetry    Atrial tach / sinus tach  - Personally Reviewed  ECG    Sinus tach  - Personally Reviewed  Physical Exam   GEN: No acute distress.   Neck: No JVD Cardiac: RR , tachycardia   Respiratory: Clear to auscultation bilaterally. GI:  very tense ascites.  MS: No edema; No deformity. Neuro:  Nonfocal  Psych: Normal affect   Labs    Chemistry Recent Labs Lab 06/25/16 1658  06/27/16 0513 06/28/16 0558 06/29/16 0705  06/30/16 0831  NA  --   < > 136 136 137 134*  K  --   < > 4.1 4.0 4.7 4.3  CL  --   < > 103 103 104 101  CO2  --   < > 21* 21* 24 23  GLUCOSE  --   < > 110* 127* 130* 169*  BUN  --   < > 35* 29* 27* 23*  CREATININE  --   < > 1.54* 1.32* 1.15* 1.21*  CALCIUM  --   < > 9.4 9.0 8.9 9.0  PROT 7.2  --  6.4* 6.5  --   --   ALBUMIN 3.2*  --  4.7 4.2  --   --   AST 27  --  22 23  --   --   ALT 18  --  12* 15  --   --   ALKPHOS 45  --  29* 33*  --   --   BILITOT  1.1  --  1.8* 1.7*  --   --   GFRNONAA  --   < > 33* 40* 47* 45*  GFRAA  --   < > 39* 47* 55* 52*  ANIONGAP  --   < > 12 12 9 10   < > = values in this interval not displayed.   Hematology Recent Labs Lab 06/25/16 1428 06/26/16 0640  WBC 8.1 5.8  RBC 4.34 4.21  HGB 14.1 13.6  HCT 41.6 40.9  MCV 95.9 97.1  MCH 32.5 32.3  MCHC 33.9 33.3  RDW 14.3 14.8  PLT 272 240    Cardiac EnzymesNo results for input(s): TROPONINI in the last 168 hours.  Recent Labs Lab 06/25/16 1442  TROPIPOC 0.00     BNP Recent Labs Lab 06/25/16 1658  BNP 148.4*     DDimer No results for input(s): DDIMER in the last 168 hours.   Radiology    No results found.  Cardiac Studies     Patient Profile     71 y.o. female with atrial tach / sinus tach   Assessment & Plan    1. Atrial tachycardia.   BP is too low increase the metoprolol We have started amio as a trial .   LFTs have been normal   Our plan is:  amiodarone 400 mg BID for 1 week then Amiodarone 200 mg BID for 2 weeks then Amiodarone 200 mg a day   She will follow up with Dr. Martinique or APP in the [place] office in 2-3 weeks .   No new recs Will sign off.   Call for questions    Mertie Moores, MD  06/30/2016 10:32 AM    [place],  [address] [place], [place]  [address] Pager [phone] Phone: [phone]; Fax: [phone]

## 2016-06-30 NOTE — Care Management Important Message (Signed)
Important Message  Patient Details  Name: Natalie Lane MRN: [id number] Date of Birth: Aug 08, 1945   Medicare Important Message Given:  Yes    Briceyda Abdullah 06/30/2016, 1:19 PM

## 2016-06-30 NOTE — Progress Notes (Signed)
Brief nutrition follow-up  Nutrition Education Note  Pt still complains of early satiety and poor appetite. Only able to eat small amounts before feeling full. Pt ate 4 bites of chicken and 4 bites of rice last night and was full. Pt can only eat 1/2 sandwich before feeding full. Reinforced importance of eating smaller, more frequent meals (5-6 times per day) to optimize nutritional intake and encouraged pt to continue this practice at home. Pt does not like the Glucerna shakes. Will discontinue  RD provided "Low Sodium Nutrition Therapy" handout from the Academy of [employer]. Reviewed patient's dietary recall. Provided examples on ways to decrease sodium intake in diet. Discouraged intake of processed foods and use of salt shaker. Encouraged fresh fruits and vegetables as well as whole grain sources of carbohydrates to maximize fiber intake.   RD discussed why it is important for patient to adhere to diet recommendations, and emphasized the role of fluids, foods to avoid, and importance of weighing self daily. Pt on 1800 mL fluid restriction, reinforced this with pt. Teach back method used.  Pt very receptive to education, Expect good compliance.  Pt to be discharged to home today.   Kerman Passey MS, RD, LDN [phone] Pager  [phone] Weekend/On-Call Pager

## 2016-07-06 ENCOUNTER — Other Ambulatory Visit (HOSPITAL_COMMUNITY): Payer: Self-pay | Admitting: Gastroenterology

## 2016-07-06 DIAGNOSIS — K7031 Alcoholic cirrhosis of liver with ascites: Secondary | ICD-10-CM | POA: Diagnosis not present

## 2016-07-06 DIAGNOSIS — R188 Other ascites: Principal | ICD-10-CM

## 2016-07-06 DIAGNOSIS — K7460 Unspecified cirrhosis of liver: Secondary | ICD-10-CM

## 2016-07-08 ENCOUNTER — Encounter (HOSPITAL_COMMUNITY): Payer: Self-pay | Admitting: General Surgery

## 2016-07-08 ENCOUNTER — Ambulatory Visit (HOSPITAL_COMMUNITY)
Admission: RE | Admit: 2016-07-08 | Discharge: 2016-07-08 | Disposition: A | Discharge status: Home / Self Care | Payer: Medicare Other | Source: Ambulatory Visit | Attending: Gastroenterology | Admitting: Gastroenterology

## 2016-07-08 DIAGNOSIS — K7031 Alcoholic cirrhosis of liver with ascites: Secondary | ICD-10-CM

## 2016-07-08 DIAGNOSIS — R188 Other ascites: Secondary | ICD-10-CM | POA: Insufficient documentation

## 2016-07-08 DIAGNOSIS — K7460 Unspecified cirrhosis of liver: Secondary | ICD-10-CM | POA: Diagnosis not present

## 2016-07-08 DIAGNOSIS — K760 Fatty (change of) liver, not elsewhere classified: Secondary | ICD-10-CM | POA: Diagnosis not present

## 2016-07-08 HISTORY — PX: IR PARACENTESIS: IMG2679

## 2016-07-08 LAB — BODY FLUID CELL COUNT WITH DIFFERENTIAL
LYMPHS FL: 25 %
Monocyte-Macrophage-Serous Fluid: 70 % (ref 50–90)
NEUTROPHIL FLUID: 5 % (ref 0–25)
Total Nucleated Cell Count, Fluid: 123 cu mm (ref 0–1000)

## 2016-07-08 LAB — ALBUMIN, PLEURAL OR PERITONEAL FLUID: Albumin, Fluid: 1.7 g/dL

## 2016-07-08 MED ORDER — ALBUMIN HUMAN 25 % IV SOLN
50.0000 g | Freq: Once | INTRAVENOUS | Status: AC
  Administered 2016-07-08: 50 g via INTRAVENOUS
  Filled 2016-07-08: qty 200

## 2016-07-08 MED ORDER — LIDOCAINE HCL 1 % IJ SOLN
INTRAMUSCULAR | Status: AC
  Filled 2016-07-08: qty 20

## 2016-07-08 MED ORDER — LIDOCAINE HCL 1 % IJ SOLN
INTRAMUSCULAR | Status: DC | PRN
  Administered 2016-07-08: 10 mL

## 2016-07-08 NOTE — Procedures (Signed)
Ultrasound-guided diagnostic and therapeutic paracentesis performed yielding 6 liters of chylous colored fluid. No immediate complications. 6L maximum was given and 50g of albumin was given as well.  Bobbi Kozakiewicz E 2:30 PM 07/08/2016

## 2016-07-09 LAB — TRIGLYCERIDES, BODY FLUIDS: Triglycerides, Fluid: 539 mg/dL

## 2016-07-14 ENCOUNTER — Telehealth: Payer: Self-pay | Admitting: *Deleted

## 2016-07-14 DIAGNOSIS — R000 Tachycardia, unspecified: Secondary | ICD-10-CM | POA: Diagnosis not present

## 2016-07-14 DIAGNOSIS — R52 Pain, unspecified: Secondary | ICD-10-CM | POA: Diagnosis not present

## 2016-07-14 DIAGNOSIS — K7460 Unspecified cirrhosis of liver: Secondary | ICD-10-CM | POA: Diagnosis not present

## 2016-07-14 NOTE — Telephone Encounter (Signed)
NOTES SENT TO SCHEDULING.  °

## 2016-07-15 NOTE — Progress Notes (Signed)
CARDIOLOGY OFFICE NOTE  Date:  07/16/2016    [name] Date of Birth: May 06, 1945 Medical Record #[id number]  PCP:  Carol Ada, MD  Cardiologist:  Smantha Boakye Martinique  MD  Chief Complaint  Patient presents with  . Shortness of Breath    when moving around and some dizziness at times   . Atrial Flutter    History of Present Illness: Natalie Lane is a 71 y.o. female who presents today for follow up PAT and prior VSD repair  She has a remote history of atrial flutter s/p RF catheter ablation in 2011. Marland Kitchen She is s/p probable VSD repair as a child. She had a normal stress Echo in the past. Echo in 2013 showed no shunt and normal LV/RV function.  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 She is s/p banding. Due to her varices she was switched from metoprolol to propranolol.  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 She has recurrent ascites managed by Dr. Michail Sermon. She was admitted in March with respiratory failure, massive ascites. She was tachycardic with some PAT. She underwent large volume paracentesis with improvement. Continued on propranolol. Diuretics increased. Not a candidate for anticoagulation due to varices.   Admitted again in May 2018 with decompensated hepatic cirrhosis, ascites, and PAT. BP too low to titrate beta blocker or CCB. On metoprolol 25 mg daily. Started on amiodarone as last resort. Had repeat paracentesis. Seen today for follow up of that hospitalization.   She states she still has abdominal swelling. Weight fluctuates by 2-3 lbs per day. Feels tremulous especially in am. Notes SOB with walking her dog. Urine output is better. She also has duodenal polyps that may need to be removed with EGD.     Past Medical History:  Diagnosis Date  . Anemia   . Ascites   . CHD (congenital heart disease)    a. 1979 s/p septal defect repair (? VSD vs ASD), old op notes not available;  b. 04/2016  Echo: EF 60-65%, no rwma, Gr2 DD, mild AS, mild TR, no PFO (prev repaired).  . Chronic diastolic CHF (congestive heart failure) ([place])    04/2016 Echo: EF 60-65%, no rwma, Gr2 DD.  Marland Kitchen Complication of anesthesia   . Cryptogenic cirrhosis ([place])    a. 04/2016 s/p paracentesis.  . Diabetes mellitus, type 2 ([place])   . Dyspnea   . Edema   . Gastric varices    a. s/p banding.  Marland Kitchen GERD (gastroesophageal reflux disease)   . History of blood transfusion   . Hyperlipidemia   . Normal stress echocardiogram 2008  . Pancreatic adenoma   . Paroxysmal atrial flutter ([place])    a. 11/2009 s/p RFCA.  Marland Kitchen PONV (postoperative nausea and vomiting)    with Cholecystectomy  . Tobacco abuse    a. smokes 4 cigarettes/day.    Past Surgical History:  Procedure Laterality Date  . ABDOMINAL HYSTERECTOMY    . ASD versus VSD repair  1979   Operative notes not available.  . ATRIAL ABLATION SURGERY  10 /2011  . [facility]   x2  . CHOLECYSTECTOMY    . ESOPHAGOGASTRODUODENOSCOPY (EGD) WITH PROPOFOL N/A 11/16/2013   Procedure: ESOPHAGOGASTRODUODENOSCOPY (EGD) WITH PROPOFOL;  Surgeon: Lear Ng, MD;  Location: [facility];  Service: Endoscopy;  Laterality: N/A;  . ESOPHAGOGASTRODUODENOSCOPY (EGD) WITH PROPOFOL N/A 01/23/2014   Procedure: ESOPHAGOGASTRODUODENOSCOPY (EGD) WITH PROPOFOL;  Surgeon:  Lear Ng, MD;  Location[facility] ENDOSCOPY;  Service: Endoscopy;  Laterality: N/A;  . GASTRIC VARICES BANDING N/A 11/16/2013   Procedure: GASTRIC VARICES BANDING;  Surgeon: Lear Ng, MD;  Location: [facility];  Service: Endoscopy;  Laterality: N/A;  . GASTRIC VARICES BANDING N/A 01/23/2014   Procedure: GASTRIC VARICES BANDING;  Surgeon: Lear Ng, MD;  Location: [facility];  Service: Endoscopy;  Laterality: N/A;  . HERNIA REPAIR    . IR GENERIC HISTORICAL  05/11/2016   IR PARACENTESIS 05/11/2016 Ascencion Dike, PA-C [facility]-INTERV RAD  . IR PARACENTESIS  07/08/2016  . OTHER SURGICAL HISTORY      partial hysterectomy     Medications: Current Outpatient Prescriptions  Medication Sig Dispense Refill  . amiodarone (PACERONE) 200 MG tablet Take 1 tablet (200 mg total) by mouth daily. 30 tablet 11  . diphenhydrAMINE (BENADRYL) 25 MG tablet Take 25 mg by mouth every 6 (six) hours as needed for allergies.    Marland Kitchen docusate sodium (COLACE) 100 MG capsule Take 100 mg by mouth 2 (two) times daily as needed for mild constipation.    . furosemide (LASIX) 40 MG tablet Take 40 mg by mouth daily.    Marland Kitchen glimepiride (AMARYL) 2 MG tablet Take 1 tablet (2 mg total) by mouth 2 (two) times daily before a meal. (Patient taking differently: Take 2 mg by mouth 2 (two) times daily. ) 180 tablet 3  . insulin NPH Human (HUMULIN N,NOVOLIN N) 100 UNIT/ML injection ReliOn brand - Pt uses 25 units in the morning and 15 units at bedtime. (Patient taking differently: Inject 15-25 Units into the skin. ReliOn brand - Pt uses 25 units in the morning and 15 units at bedtime.)    . Insulin Syringe-Needle U-100 (RELION INSULIN SYRINGE) 31G X 15/64" 0.3 ML MISC Use to inject insulin two times daily 200 each 3  . lactulose (CHRONULAC) 10 GM/15ML solution Take 10 g by mouth 2 (two) times daily.     Marland Kitchen loperamide (IMODIUM A-D) 2 MG tablet Take 2 mg by mouth 4 (four) times daily as needed for diarrhea or loose stools.    . metFORMIN (GLUCOPHAGE-XR) 500 MG 24 hr tablet Take 1 tablet (500 mg total) by mouth 2 (two) times daily with a meal. 180 tablet 1  . metoprolol tartrate (LOPRESSOR) 25 MG tablet Take 0.5 tablets (12.5 mg total) by mouth 2 (two) times daily. 60 tablet 2  . ranitidine (ZANTAC) 150 MG capsule Take 150 mg by mouth daily as needed for heartburn.    . spironolactone (ALDACTONE) 100 MG tablet Take 1 tablet (100 mg total) by mouth daily. (Patient taking differently: Take 150 mg by mouth daily. ) 30 tablet 0   No current facility-administered medications for this visit.     Allergies: Allergies  Allergen Reactions  .  Monosodium Glutamate Anaphylaxis    MSG  . Red Dye Anaphylaxis  . Erythromycin Other (See Comments)    Reaction:  Migraine   . Penicillins Other (See Comments)    Reaction:  Migraine  Has patient had a PCN reaction causing immediate rash, facial/tongue/throat swelling, SOB or lightheadedness with hypotension: No Has patient had a PCN reaction causing severe rash involving mucus membranes or skin necrosis: No Has patient had a PCN reaction that required hospitalization No Has patient had a PCN reaction occurring within the last 10 years: No If all of the above answers are "NO", then may proceed with Cephalosporin use.  . Statins Other (See Comments)  Reaction:  Severe leg cramps   . Sulfa Antibiotics Hives  . Glipizide Rash  . Iohexol Rash  . Spironolactone Palpitations and Other (See Comments)    Sweating    Social History: The patient  reports that she has been smoking Cigarettes.  She has a 18.00 pack-year smoking history. She has never used smokeless tobacco. She reports that she does not drink alcohol or use drugs.   Family History: The patient's family history includes Depression in her paternal grandfather; Diabetes in her brother; Hypertension in her mother; Kidney disease in her sister.   Review of Systems: Please see the history of present illness.   Otherwise, the review of systems is positive for none.   All other systems are reviewed and negative.   Physical Exam: VS:  BP (!) 96/52   Pulse 87   Ht 5' 3.5" (1.613 m)   Wt 190 lb 3.2 oz (86.3 kg)   SpO2 97%   BMI 33.16 kg/m  .  BMI Body mass index is 33.16 kg/m.  Wt Readings from Last 3 Encounters:  07/16/16 190 lb 3.2 oz (86.3 kg)  06/28/16 195 lb (88.5 kg)  05/31/16 198 lb 6.4 oz (90 kg)    General: Pleasant. Well developed, well nourished and in no acute distress.   HEENT: Normal.  Neck: Supple, no JVD, carotid bruits, or masses noted.  Cardiac: Regular rate and rhythm. 2/6 outflow murmur noted.  No  edema.  Respiratory:  Lungs are clear to auscultation bilaterally with normal work of breathing.  GI: Soft with large amount of ascites.  MS: No deformity or atrophy. Gait and ROM intact.  Skin: Warm and dry. Color is normal.  Neuro:  Strength and sensation are intact and no gross focal deficits noted.  Psych: Alert, appropriate and with normal affect.   LABORATORY DATA:  EKG:  EKG is not ordered today.  Lab Results  Component Value Date   WBC 5.8 06/26/2016   HGB 13.6 06/26/2016   HCT 40.9 06/26/2016   PLT 240 06/26/2016   GLUCOSE 169 (H) 06/30/2016   CHOL 287 (A) 02/28/2014   TRIG 148 02/28/2014   HDL 62 02/28/2014   LDLCALC 196 02/28/2014   ALT 15 06/28/2016   AST 23 06/28/2016   NA 134 (L) 06/30/2016   K 4.3 06/30/2016   CL 101 06/30/2016   CREATININE 1.21 (H) 06/30/2016   BUN 23 (H) 06/30/2016   CO2 23 06/30/2016   TSH 1.799 06/29/2016   INR 1.27 06/26/2016   HGBA1C 6.7 05/31/2016   MICROALBUR see comment  05/24/2016    BNP (last 3 results)  Recent Labs  04/26/16 1308 05/10/16 1431 06/25/16 1658  BNP 96.5 114.7* 148.4*    ProBNP (last 3 results) No results for input(s): PROBNP in the last 8760 hours.   Other Studies Reviewed Today:  Echo Study Conclusions 05/12/16: Study Conclusions  - Left ventricle: The cavity size was normal. Wall thickness was   increased in a pattern of mild LVH. Systolic function was normal.   The estimated ejection fraction was in the range of 60% to 65%.   Wall motion was normal; there were no regional wall motion   abnormalities. Doppler parameters are consistent with   pseudonormal left ventricular relaxation (grade 2 diastolic   dysfunction). The E/e&' ratio is >15, suggesting elevated LV   filling pressure. - Ventricular septum: No residual VSD. - Aortic valve: Trileaflet; mildly calcified leaflets. Mild   stenosis. Mean gradient (S): 10 mm Hg.  Peak gradient (S): 18 mm   Hg. Valve area (VTI): 1.12 cm^2. Valve area  (Vmax): 1.21 cm^2.   Valve area (Vmean): 1.05 cm^2. - Mitral valve: Calcified annulus. Mildly thickened leaflets .   Valve area by pressure half-time: 2.22 cm^2. Valve area by   continuity equation (using LVOT flow): 1.15 cm^2. - Left atrium: The atrium was at the upper limits of normal in   size. - Right atrium: The atrium was at the upper limits of normal in   size. - Atrial septum: No defect or patent foramen ovale was identified   (previously repaired). - Tricuspid valve: There was mild regurgitation. - Pulmonary arteries: PA peak pressure: 23 mm Hg (S). - Inferior vena cava: The vessel was normal in size. The   respirophasic diameter changes were in the normal range (>= 50%),   consistent with normal central venous pressure.  Impressions:  - Compared to a prior study in 2017, there are no significant   changes.  Assessment/Plan: 1. Remote VSD repair. No shunt by last Echo  2. History of atrial flutter/PAT. More sustained arrhythmia due to ascites and respiratory failure. Unable to tolerate titration of AV nodal agents due to hypotension. Now on amiodarone. Not ideal in setting of cirrhosis. Will need to monitor LFTs closely. Go ahead and reduce dose to 200 mg daily. Try and get her on lowest dose possible.  She is not a candidate for anticoagulation due to cirrhosis and gastric varices.   3. Cirrhosis with gastric varices and recurrent ascites  4. Pancreatic ampulla and duodenal adenoma.   5. DM  Current medicines are reviewed with the patient today.  The patient does not have concerns regarding medicines other than what has been noted above.  The following changes have been made:  See above.  Labs/ tests ordered today include:    No orders of the defined types were placed in this encounter.    Signed: Aeva Posey Martinique MD, [facility]  07/16/2016 2:38 PM  [facility]

## 2016-07-16 ENCOUNTER — Encounter: Payer: Self-pay | Admitting: Cardiology

## 2016-07-16 ENCOUNTER — Ambulatory Visit (INDEPENDENT_AMBULATORY_CARE_PROVIDER_SITE_OTHER): Payer: Medicare Other | Admitting: Cardiology

## 2016-07-16 VITALS — BP 96/52 | HR 87 | Ht 63.5 in | Wt 190.2 lb

## 2016-07-16 DIAGNOSIS — Z9889 Other specified postprocedural states: Secondary | ICD-10-CM | POA: Diagnosis not present

## 2016-07-16 DIAGNOSIS — Z8774 Personal history of (corrected) congenital malformations of heart and circulatory system: Secondary | ICD-10-CM | POA: Diagnosis not present

## 2016-07-16 DIAGNOSIS — I483 Typical atrial flutter: Secondary | ICD-10-CM

## 2016-07-16 DIAGNOSIS — R188 Other ascites: Secondary | ICD-10-CM

## 2016-07-16 DIAGNOSIS — K7460 Unspecified cirrhosis of liver: Secondary | ICD-10-CM | POA: Diagnosis not present

## 2016-07-16 MED ORDER — AMIODARONE HCL 200 MG PO TABS: 200.0000 mg | ORAL_TABLET | Freq: Every day | ORAL | 11 refills | Status: DC

## 2016-07-16 NOTE — Patient Instructions (Signed)
Reduce amiodarone to 200 mg daily  I will see you in 2 months.

## 2016-07-19 DIAGNOSIS — K7031 Alcoholic cirrhosis of liver with ascites: Secondary | ICD-10-CM | POA: Diagnosis not present

## 2016-08-02 DIAGNOSIS — K7460 Unspecified cirrhosis of liver: Secondary | ICD-10-CM | POA: Diagnosis not present

## 2016-08-06 ENCOUNTER — Other Ambulatory Visit (HOSPITAL_COMMUNITY): Payer: Self-pay | Admitting: Gastroenterology

## 2016-08-06 DIAGNOSIS — K7031 Alcoholic cirrhosis of liver with ascites: Secondary | ICD-10-CM

## 2016-08-08 ENCOUNTER — Other Ambulatory Visit: Payer: Self-pay | Admitting: Internal Medicine

## 2016-08-09 ENCOUNTER — Ambulatory Visit (HOSPITAL_COMMUNITY)
Admission: RE | Admit: 2016-08-09 | Discharge: 2016-08-09 | Disposition: A | Discharge status: Home / Self Care | Payer: Medicare Other | Source: Ambulatory Visit | Attending: Gastroenterology | Admitting: Gastroenterology

## 2016-08-09 ENCOUNTER — Encounter (HOSPITAL_COMMUNITY): Payer: Self-pay | Admitting: Interventional Radiology

## 2016-08-09 DIAGNOSIS — R188 Other ascites: Secondary | ICD-10-CM | POA: Insufficient documentation

## 2016-08-09 DIAGNOSIS — K7031 Alcoholic cirrhosis of liver with ascites: Secondary | ICD-10-CM

## 2016-08-09 HISTORY — PX: IR PARACENTESIS: IMG2679

## 2016-08-09 MED ORDER — LIDOCAINE HCL (PF) 1 % IJ SOLN
INTRAMUSCULAR | Status: DC | PRN
  Administered 2016-08-09: 10 mL

## 2016-08-09 MED ORDER — ALBUMIN HUMAN 25 % IV SOLN
50.0000 g | Freq: Once | INTRAVENOUS | Status: AC
  Administered 2016-08-09: 50 g via INTRAVENOUS
  Filled 2016-08-09: qty 200

## 2016-08-09 MED ORDER — LIDOCAINE HCL (PF) 1 % IJ SOLN
INTRAMUSCULAR | Status: AC
  Filled 2016-08-09: qty 30

## 2016-08-09 NOTE — Procedures (Signed)
PROCEDURE SUMMARY:  Successful US guided paracentesis from right lateral abdomen.  Yielded 6.0 liters of chylous fluid.  No immediate complications.  Pt tolerated well.   Specimen was not sent for labs.  Patient still with large volume ascites after 6.0 liters removed.  She received 50g albumin post-procedure.   Docia Barrier PA-C 08/09/2016 2:28 PM

## 2016-08-10 ENCOUNTER — Other Ambulatory Visit: Payer: Self-pay | Admitting: Internal Medicine

## 2016-08-10 MED ORDER — INSULIN NPH (HUMAN) (ISOPHANE) 100 UNIT/ML ~~LOC~~ SUSP: SUBCUTANEOUS | 4 refills | Status: DC

## 2016-08-10 NOTE — Telephone Encounter (Signed)
Okay to switch?  

## 2016-08-10 NOTE — Telephone Encounter (Signed)
Submitted

## 2016-08-10 NOTE — Telephone Encounter (Signed)
Humulin script cost $200 which is unaffordable for patient. She is wanting Novolin for $45 from her Product/process development scientist.  Thank you,  -LL

## 2016-08-10 NOTE — Telephone Encounter (Signed)
Please advise of note, okay to switch to the Novolin?  Thank you!

## 2016-08-12 DIAGNOSIS — K7460 Unspecified cirrhosis of liver: Secondary | ICD-10-CM | POA: Diagnosis not present

## 2016-08-17 DIAGNOSIS — Z8719 Personal history of other diseases of the digestive system: Secondary | ICD-10-CM | POA: Diagnosis not present

## 2016-08-17 DIAGNOSIS — I8510 Secondary esophageal varices without bleeding: Secondary | ICD-10-CM | POA: Diagnosis not present

## 2016-08-17 DIAGNOSIS — Z85068 Personal history of other malignant neoplasm of small intestine: Secondary | ICD-10-CM | POA: Diagnosis not present

## 2016-08-17 DIAGNOSIS — Z79899 Other long term (current) drug therapy: Secondary | ICD-10-CM | POA: Diagnosis not present

## 2016-08-17 DIAGNOSIS — K635 Polyp of colon: Secondary | ICD-10-CM | POA: Diagnosis not present

## 2016-08-17 DIAGNOSIS — K7469 Other cirrhosis of liver: Secondary | ICD-10-CM | POA: Diagnosis not present

## 2016-08-17 DIAGNOSIS — Z794 Long term (current) use of insulin: Secondary | ICD-10-CM | POA: Diagnosis not present

## 2016-08-17 DIAGNOSIS — I509 Heart failure, unspecified: Secondary | ICD-10-CM | POA: Diagnosis not present

## 2016-08-17 DIAGNOSIS — K2930 Chronic superficial gastritis without bleeding: Secondary | ICD-10-CM | POA: Diagnosis not present

## 2016-08-17 DIAGNOSIS — I8501 Esophageal varices with bleeding: Secondary | ICD-10-CM | POA: Diagnosis not present

## 2016-08-17 DIAGNOSIS — I8500 Esophageal varices without bleeding: Secondary | ICD-10-CM | POA: Diagnosis not present

## 2016-08-17 DIAGNOSIS — K7460 Unspecified cirrhosis of liver: Secondary | ICD-10-CM | POA: Diagnosis not present

## 2016-08-17 DIAGNOSIS — K3189 Other diseases of stomach and duodenum: Secondary | ICD-10-CM | POA: Diagnosis not present

## 2016-08-17 DIAGNOSIS — K766 Portal hypertension: Secondary | ICD-10-CM | POA: Diagnosis not present

## 2016-08-17 DIAGNOSIS — E119 Type 2 diabetes mellitus without complications: Secondary | ICD-10-CM | POA: Diagnosis not present

## 2016-08-17 DIAGNOSIS — K317 Polyp of stomach and duodenum: Secondary | ICD-10-CM | POA: Diagnosis not present

## 2016-08-17 DIAGNOSIS — K2960 Other gastritis without bleeding: Secondary | ICD-10-CM | POA: Diagnosis not present

## 2016-08-17 DIAGNOSIS — Z1211 Encounter for screening for malignant neoplasm of colon: Secondary | ICD-10-CM | POA: Diagnosis not present

## 2016-08-17 DIAGNOSIS — Z87891 Personal history of nicotine dependence: Secondary | ICD-10-CM | POA: Diagnosis not present

## 2016-08-23 ENCOUNTER — Inpatient Hospital Stay (HOSPITAL_COMMUNITY)
Admission: EM | Admit: 2016-08-23 | Discharge: 2016-08-25 | DRG: 433 | Disposition: A | Discharge status: Home / Self Care | Payer: Medicare Other | Attending: Internal Medicine | Admitting: Internal Medicine

## 2016-08-23 ENCOUNTER — Emergency Department (HOSPITAL_COMMUNITY): Payer: Medicare Other

## 2016-08-23 ENCOUNTER — Encounter (HOSPITAL_COMMUNITY): Payer: Self-pay

## 2016-08-23 DIAGNOSIS — I9589 Other hypotension: Secondary | ICD-10-CM | POA: Diagnosis present

## 2016-08-23 DIAGNOSIS — K7460 Unspecified cirrhosis of liver: Secondary | ICD-10-CM | POA: Diagnosis present

## 2016-08-23 DIAGNOSIS — K7290 Hepatic failure, unspecified without coma: Secondary | ICD-10-CM | POA: Diagnosis present

## 2016-08-23 DIAGNOSIS — Z882 Allergy status to sulfonamides status: Secondary | ICD-10-CM

## 2016-08-23 DIAGNOSIS — Z818 Family history of other mental and behavioral disorders: Secondary | ICD-10-CM

## 2016-08-23 DIAGNOSIS — Z8249 Family history of ischemic heart disease and other diseases of the circulatory system: Secondary | ICD-10-CM

## 2016-08-23 DIAGNOSIS — Z841 Family history of disorders of kidney and ureter: Secondary | ICD-10-CM

## 2016-08-23 DIAGNOSIS — E871 Hypo-osmolality and hyponatremia: Secondary | ICD-10-CM | POA: Diagnosis not present

## 2016-08-23 DIAGNOSIS — I4892 Unspecified atrial flutter: Secondary | ICD-10-CM | POA: Diagnosis not present

## 2016-08-23 DIAGNOSIS — I5032 Chronic diastolic (congestive) heart failure: Secondary | ICD-10-CM | POA: Diagnosis present

## 2016-08-23 DIAGNOSIS — R188 Other ascites: Secondary | ICD-10-CM

## 2016-08-23 DIAGNOSIS — N183 Chronic kidney disease, stage 3 (moderate): Secondary | ICD-10-CM | POA: Diagnosis present

## 2016-08-23 DIAGNOSIS — R0602 Shortness of breath: Secondary | ICD-10-CM

## 2016-08-23 DIAGNOSIS — K760 Fatty (change of) liver, not elsewhere classified: Secondary | ICD-10-CM | POA: Diagnosis present

## 2016-08-23 DIAGNOSIS — T502X5A Adverse effect of carbonic-anhydrase inhibitors, benzothiadiazides and other diuretics, initial encounter: Secondary | ICD-10-CM | POA: Diagnosis present

## 2016-08-23 DIAGNOSIS — F17210 Nicotine dependence, cigarettes, uncomplicated: Secondary | ICD-10-CM | POA: Diagnosis present

## 2016-08-23 DIAGNOSIS — I483 Typical atrial flutter: Secondary | ICD-10-CM | POA: Diagnosis not present

## 2016-08-23 DIAGNOSIS — R072 Precordial pain: Secondary | ICD-10-CM | POA: Diagnosis not present

## 2016-08-23 DIAGNOSIS — Z79899 Other long term (current) drug therapy: Secondary | ICD-10-CM

## 2016-08-23 DIAGNOSIS — Z794 Long term (current) use of insulin: Secondary | ICD-10-CM

## 2016-08-23 DIAGNOSIS — Z888 Allergy status to other drugs, medicaments and biological substances status: Secondary | ICD-10-CM

## 2016-08-23 DIAGNOSIS — N179 Acute kidney failure, unspecified: Secondary | ICD-10-CM | POA: Diagnosis present

## 2016-08-23 DIAGNOSIS — Z833 Family history of diabetes mellitus: Secondary | ICD-10-CM

## 2016-08-23 DIAGNOSIS — Z90710 Acquired absence of both cervix and uterus: Secondary | ICD-10-CM

## 2016-08-23 DIAGNOSIS — R079 Chest pain, unspecified: Secondary | ICD-10-CM | POA: Diagnosis not present

## 2016-08-23 DIAGNOSIS — K7469 Other cirrhosis of liver: Secondary | ICD-10-CM | POA: Diagnosis not present

## 2016-08-23 DIAGNOSIS — Z9049 Acquired absence of other specified parts of digestive tract: Secondary | ICD-10-CM

## 2016-08-23 DIAGNOSIS — E1122 Type 2 diabetes mellitus with diabetic chronic kidney disease: Secondary | ICD-10-CM | POA: Diagnosis present

## 2016-08-23 DIAGNOSIS — Z880 Allergy status to penicillin: Secondary | ICD-10-CM

## 2016-08-23 DIAGNOSIS — K219 Gastro-esophageal reflux disease without esophagitis: Secondary | ICD-10-CM | POA: Diagnosis present

## 2016-08-23 LAB — CBC
HEMATOCRIT: 41 % (ref 36.0–46.0)
HEMOGLOBIN: 13.8 g/dL (ref 12.0–15.0)
MCH: 31.7 pg (ref 26.0–34.0)
MCHC: 33.7 g/dL (ref 30.0–36.0)
MCV: 94 fL (ref 78.0–100.0)
Platelets: 220 10*3/uL (ref 150–400)
RBC: 4.36 MIL/uL (ref 3.87–5.11)
RDW: 15.4 % (ref 11.5–15.5)
WBC: 8.4 10*3/uL (ref 4.0–10.5)

## 2016-08-23 LAB — GLUCOSE, CAPILLARY: Glucose-Capillary: 108 mg/dL — ABNORMAL HIGH (ref 65–99)

## 2016-08-23 LAB — BASIC METABOLIC PANEL
Anion gap: 13 (ref 5–15)
BUN: 35 mg/dL — AB (ref 6–20)
CHLORIDE: 100 mmol/L — AB (ref 101–111)
CO2: 19 mmol/L — ABNORMAL LOW (ref 22–32)
Calcium: 9.6 mg/dL (ref 8.9–10.3)
Creatinine, Ser: 2.27 mg/dL — ABNORMAL HIGH (ref 0.44–1.00)
GFR calc non Af Amer: 21 mL/min — ABNORMAL LOW (ref >60)
GFR, EST AFRICAN AMERICAN: 24 mL/min — AB (ref >60)
Glucose, Bld: 99 mg/dL (ref 65–99)
POTASSIUM: 5.3 mmol/L — AB (ref 3.5–5.1)
SODIUM: 132 mmol/L — AB (ref 135–145)

## 2016-08-23 LAB — HEPATIC FUNCTION PANEL
ALBUMIN: 3.5 g/dL (ref 3.5–5.0)
ALK PHOS: 46 U/L (ref 38–126)
ALT: 15 U/L (ref 14–54)
AST: 27 U/L (ref 15–41)
BILIRUBIN DIRECT: 0.4 mg/dL (ref 0.1–0.5)
BILIRUBIN TOTAL: 1.5 mg/dL — AB (ref 0.3–1.2)
Indirect Bilirubin: 1.1 mg/dL — ABNORMAL HIGH (ref 0.3–0.9)
Total Protein: 7.3 g/dL (ref 6.5–8.1)

## 2016-08-23 LAB — I-STAT TROPONIN, ED: Troponin i, poc: 0.01 ng/mL (ref 0.00–0.08)

## 2016-08-23 LAB — PROTIME-INR
INR: 1.24
PROTHROMBIN TIME: 15.7 s — AB (ref 11.4–15.2)

## 2016-08-23 LAB — CBG MONITORING, ED: Glucose-Capillary: 76 mg/dL (ref 65–99)

## 2016-08-23 MED ORDER — METOPROLOL TARTRATE 25 MG PO TABS
12.5000 mg | ORAL_TABLET | Freq: Two times a day (BID) | ORAL | Status: DC
  Administered 2016-08-24 – 2016-08-25 (×3): 12.5 mg via ORAL
  Filled 2016-08-23 (×4): qty 1

## 2016-08-23 MED ORDER — FUROSEMIDE 20 MG PO TABS
20.0000 mg | ORAL_TABLET | Freq: Every day | ORAL | Status: DC
  Administered 2016-08-24: 20 mg via ORAL
  Filled 2016-08-23 (×2): qty 1

## 2016-08-23 MED ORDER — DIPHENHYDRAMINE HCL 25 MG PO CAPS: 25.0000 mg | ORAL_CAPSULE | Freq: Four times a day (QID) | ORAL | Status: DC | PRN

## 2016-08-23 MED ORDER — AMIODARONE HCL 200 MG PO TABS
200.0000 mg | ORAL_TABLET | Freq: Every day | ORAL | Status: DC
  Administered 2016-08-24 – 2016-08-25 (×2): 200 mg via ORAL
  Filled 2016-08-23 (×2): qty 1

## 2016-08-23 MED ORDER — FAMOTIDINE 20 MG PO TABS
20.0000 mg | ORAL_TABLET | Freq: Every day | ORAL | Status: DC
Start: 2016-08-24 — End: 2016-08-25
  Administered 2016-08-24 – 2016-08-25 (×2): 20 mg via ORAL
  Filled 2016-08-23 (×2): qty 1

## 2016-08-23 MED ORDER — INSULIN NPH (HUMAN) (ISOPHANE) 100 UNIT/ML ~~LOC~~ SUSP
10.0000 [IU] | Freq: Two times a day (BID) | SUBCUTANEOUS | Status: DC
  Filled 2016-08-23: qty 10

## 2016-08-23 MED ORDER — SODIUM CHLORIDE 0.9% FLUSH
3.0000 mL | Freq: Two times a day (BID) | INTRAVENOUS | Status: DC
  Administered 2016-08-24 – 2016-08-25 (×3): 3 mL via INTRAVENOUS

## 2016-08-23 MED ORDER — INSULIN ASPART 100 UNIT/ML ~~LOC~~ SOLN
0.0000 [IU] | Freq: Three times a day (TID) | SUBCUTANEOUS | Status: DC
  Administered 2016-08-24 (×2): 2 [IU] via SUBCUTANEOUS
  Administered 2016-08-25: 1 [IU] via SUBCUTANEOUS

## 2016-08-23 MED ORDER — LACTULOSE 10 GM/15ML PO SOLN
10.0000 g | Freq: Two times a day (BID) | ORAL | Status: DC
  Administered 2016-08-24 – 2016-08-25 (×3): 10 g via ORAL
  Filled 2016-08-23 (×4): qty 15

## 2016-08-23 MED ORDER — ONDANSETRON HCL 4 MG/2ML IJ SOLN: 4.0000 mg | Freq: Four times a day (QID) | INTRAMUSCULAR | Status: DC | PRN

## 2016-08-23 MED ORDER — ALBUMIN HUMAN 25 % IV SOLN
50.0000 g | Freq: Once | INTRAVENOUS | Status: AC | PRN
  Administered 2016-08-24: 50 g via INTRAVENOUS
  Filled 2016-08-23 (×2): qty 200

## 2016-08-23 NOTE — ED Provider Notes (Signed)
Emergency Department Provider Note   I have reviewed the triage vital signs and the nursing notes.   HISTORY  Chief Complaint Shortness of Breath and Chest Pain   HPI Natalie Lane is a 71 y.o. female with PMH of CHD, liver cirrhosis with LVP required in the past, DM, HLD, and PAF presents to the emergency department for evaluation of shortness of breath and chest pain worsening over the past 3 days. Her pain is left sided and heavy with some occasional sharpness. No radiation of pain. She reports some associated abdominal distention and feels like her abdominal fluid needs to be drained again. She was last strained 3 weeks ago during a hospitalization. She's had issues with hypotension and chest pain in the past related to her cirrhosis. She denies any fevers, productive cough, shaking chills. She has been compliant with her Lasix with no changes in dosing recently.   Past Medical History:  Diagnosis Date  . Anemia   . Ascites   . CHD (congenital heart disease)    a. 1979 s/p septal defect repair (? VSD vs ASD), old op notes not available;  b. 04/2016 Echo: EF 60-65%, no rwma, Gr2 DD, mild AS, mild TR, no PFO (prev repaired).  . Chronic diastolic CHF (congestive heart failure) ([place])    04/2016 Echo: EF 60-65%, no rwma, Gr2 DD.  Marland Kitchen Complication of anesthesia   . 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 04/2016 s/p paracentesis.  . Diabetes mellitus, type 2 ([place])   . Dyspnea   . Edema   . Gastric varices    a. s/p banding.  Marland Kitchen GERD (gastroesophageal reflux disease)   . History of blood transfusion   . Hyperlipidemia   . Normal stress echocardiogram 2008  . Pancreatic adenoma   . Paroxysmal atrial flutter ([place])    a. 11/2009 s/p RFCA.  Marland Kitchen PONV (postoperative nausea and vomiting)    with Cholecystectomy  . Tobacco abuse    a. smokes 4 cigarettes/day.    Patient Active Problem List   Diagnosis Date Noted  . ARF (acute renal failure) ([place]) 06/26/2016  . Tobacco use 06/26/2016  .  Decompensated liver disease ([place]) 06/26/2016  . AKI (acute kidney injury) ([place])   . PAT (paroxysmal atrial tachycardia) ([place]) 05/12/2016  . Ascites 05/10/2016  . Decompensated hepatic cirrhosis ([place]) 05/10/2016  . Esophageal varices ([place]) 01/23/2014  . Cirrhosis ([place]) 11/16/2013  . Chest pain at rest 09/23/2011  . Heart palpitations 12/03/2010  . Atrial flutter ([place])   . CHD (congenital heart disease)   . Edema   . Hyperlipidemia   . Poorly controlled type 2 diabetes mellitus with peripheral neuropathy [facility])     Past Surgical History:  Procedure Laterality Date  . ABDOMINAL HYSTERECTOMY    . ASD versus VSD repair  1979   Operative notes not available.  . ATRIAL ABLATION SURGERY  10 /2011  . [place]   x2  . CHOLECYSTECTOMY    . ESOPHAGOGASTRODUODENOSCOPY (EGD) WITH PROPOFOL N/A 11/16/2013   Procedure: ESOPHAGOGASTRODUODENOSCOPY (EGD) WITH PROPOFOL;  Surgeon: Lear Ng, MD;  Location: [place];  Service: Endoscopy;  Laterality: N/A;  . ESOPHAGOGASTRODUODENOSCOPY (EGD) WITH PROPOFOL N/A 01/23/2014   Procedure: ESOPHAGOGASTRODUODENOSCOPY (EGD) WITH PROPOFOL;  Surgeon: Lear Ng, MD;  Location: [place];  Service: Endoscopy;  Laterality: N/A;  . GASTRIC VARICES BANDING N/A 11/16/2013   Procedure: GASTRIC VARICES BANDING;  Surgeon: Lear Ng, MD;  Location: [place];  Service: Endoscopy;  Laterality: N/A;  . GASTRIC VARICES BANDING N/A 01/23/2014   Procedure: GASTRIC VARICES BANDING;  Surgeon: Lear Ng, MD;  Location: [place];  Service: Endoscopy;  Laterality: N/A;  . HERNIA REPAIR    . IR GENERIC HISTORICAL  05/11/2016   IR PARACENTESIS 05/11/2016 Ascencion Dike, PA-C [facility]-INTERV RAD  . IR PARACENTESIS  07/08/2016  . IR PARACENTESIS  08/09/2016  . OTHER SURGICAL HISTORY     partial hysterectomy      Allergies Monosodium glutamate; Red dye; Erythromycin; Penicillins; Statins; Sulfa antibiotics; Glipizide; Iohexol;  Spironolactone; and Tape  Family History  Problem Relation Age of Onset  . Hypertension Mother   . Kidney disease Sister   . Depression Paternal Grandfather   . Diabetes Brother     Social History Social History  Substance Use Topics  . Smoking status: Current Every Day Smoker    Packs/day: 0.50    Years: 36.00    Types: Cigarettes  . Smokeless tobacco: Never Used     Comment: was smoking 4 cigs/day prior to admission.  . Alcohol use No    Review of Systems  Constitutional: No fever/chills Eyes: No visual changes. ENT: No sore throat. Cardiovascular: Positive chest pain. Respiratory: Positive shortness of breath. Gastrointestinal: Positive abdominal pain and distension.  No nausea, no vomiting.  No diarrhea.  No constipation.  Genitourinary: Negative for dysuria. Musculoskeletal: Negative for back pain. Skin: Negative for rash. Neurological: Negative for headaches, focal weakness or numbness.  10-point ROS otherwise negative.  ____________________________________________   PHYSICAL EXAM:  VITAL SIGNS: ED Triage Vitals  Enc Vitals Group     BP 08/23/16 1546 (!) 116/41     Pulse Rate 08/23/16 1546 (!) 110     Resp 08/23/16 1546 (!) 24     Temp 08/23/16 1546 98 F (36.7 C)     Temp Source 08/23/16 1546 Oral     SpO2 08/23/16 1546 96 %     Weight 08/23/16 1547 196 lb (88.9 kg)     Height 08/23/16 1547 5\' 3"  (1.6 m)   Constitutional: Alert and oriented. Well appearing and in no acute distress. Eyes: Conjunctivae are normal. Head: Atraumatic. Nose: No congestion/rhinnorhea. Mouth/Throat: Mucous membranes are moist.  Oropharynx non-erythematous. Neck: No stridor.  Cardiovascular: Tachycardia. Good peripheral circulation. Grossly normal heart sounds.   Respiratory: Increased respiratory effort.  No retractions. Lungs with faint rales at the bases.  Gastrointestinal: Soft and nontender. Positive abdominal distension with fluid wave.  Musculoskeletal: No lower  extremity tenderness nor edema. No gross deformities of extremities. Neurologic:  Normal speech and language. No gross focal neurologic deficits are appreciated.  Skin:  Skin is warm, dry and intact. No rash noted.  ____________________________________________   LABS (all labs ordered are listed, but only abnormal results are displayed)  Labs Reviewed  BASIC METABOLIC PANEL - Abnormal; Notable for the following:       Result Value   Sodium 132 (*)    Potassium 5.3 (*)    Chloride 100 (*)    CO2 19 (*)    BUN 35 (*)    Creatinine, Ser 2.27 (*)    GFR calc non Af Amer 21 (*)    GFR calc Af Amer 24 (*)    All other components within normal limits  PROTIME-INR - Abnormal; Notable for the following:    Prothrombin Time 15.7 (*)    All other components within normal limits  HEPATIC FUNCTION PANEL - Abnormal; Notable for the following:    Total  Bilirubin 1.5 (*)    Indirect Bilirubin 1.1 (*)    All other components within normal limits  GLUCOSE, CAPILLARY - Abnormal; Notable for the following:    Glucose-Capillary 108 (*)    All other components within normal limits  CBC  AMIODARONE LEVEL  BASIC METABOLIC PANEL  I-STAT TROPOININ, ED  CBG MONITORING, ED   ____________________________________________  EKG   EKG Interpretation  Date/Time:  Monday August 23 2016 15:44:20 EDT Ventricular Rate:  105 PR Interval:  192 QRS Duration: 84 QT Interval:  344 QTC Calculation: 454 R Axis:   45 Text Interpretation:  Sinus tachycardia Low voltage QRS Nonspecific T wave abnormality Abnormal ECG No STEMI.  Confirmed by Nanda Quinton [phone]) on 08/23/2016 3:59:37 PM       ____________________________________________  RADIOLOGY  Dg Chest 2 View  Result Date: 08/23/2016 CLINICAL DATA:  Shortness of breath and left-sided chest pain EXAM: CHEST  2 VIEW COMPARISON:  06/25/2016 FINDINGS: Low volume chest with bandlike opacities at the bases. No edema, air bronchogram, effusion, or pneumothorax.  Normal heart size and stable mediastinal contours. Previous median sternotomy with chronic fracturing of the lower sternal wire. IMPRESSION: 1. No acute finding.  Stable compared to 06/25/2016. 2. Atelectasis or scarring at the bases. Electronically Signed   By: Monte Fantasia M.D.   On: 08/23/2016 16:48    ____________________________________________   PROCEDURES  Procedure(s) performed:   Procedures  None ____________________________________________   INITIAL IMPRESSION / ASSESSMENT AND PLAN / ED COURSE  Pertinent labs & imaging results that were available during my care of the patient were reviewed by me and considered in my medical decision making (see chart for details).  Patient resents to the emergency department for evaluation of difficulty breathing and chest pain in the setting of worsening ascites. Patient has a tense abdomen with mild increased work of breathing. Lungs with crackles at the bases bilaterally. Suspect that her symptoms are secondary to large volume ascites but patient does have a significant coronary artery history. Doesn't appear to be volume overloaded. Plan for chest x-ray, labs, reassessment. EKG with no acute ischemia but does have diffuse low voltage.   06:45 PM Updated patient and husband on lab results. Patient has an AKI in addition to low BP. Similar to prior presentation. Plan for admission for enzyme trending, BP mgmt, and LVP.   Discussed patient's case with Hospitalist, Dr. Alcario Drought. Patient and family (if present) updated with plan. Care transferred to Hospitalist service.  I reviewed all nursing notes, vitals, pertinent old records, EKGs, labs, imaging (as available).  ____________________________________________  FINAL CLINICAL IMPRESSION(S) / ED DIAGNOSES  Final diagnoses:  Precordial chest pain  Shortness of breath  Other ascites     MEDICATIONS GIVEN DURING THIS VISIT:  Medications  albumin human 25 % solution 50 g (not  administered)  furosemide (LASIX) tablet 20 mg (not administered)  diphenhydrAMINE (BENADRYL) tablet 25 mg (not administered)  famotidine (PEPCID) tablet 20 mg (not administered)  lactulose (CHRONULAC) 10 GM/15ML solution 10 g (not administered)  metoprolol tartrate (LOPRESSOR) tablet 12.5 mg (not administered)  insulin NPH Human (HUMULIN N,NOVOLIN N) injection 10 Units (not administered)  insulin aspart (novoLOG) injection 0-9 Units (not administered)  sodium chloride flush (NS) 0.9 % injection 3 mL (not administered)  amiodarone (PACERONE) tablet 200 mg (not administered)  ondansetron (ZOFRAN) injection 4 mg (not administered)     NEW OUTPATIENT MEDICATIONS STARTED DURING THIS VISIT:  None   Note:  This document was prepared using Dragon voice  recognition software and may include unintentional dictation errors.  Nanda Quinton, MD Emergency Medicine    Jezebelle Ledwell, [facility], MD 08/24/16 Adelfa Koh

## 2016-08-23 NOTE — ED Notes (Signed)
Attempted PIVx 3; no access. Enough blood drawn to run [employer]. 2nd RN to attempt PIV.

## 2016-08-23 NOTE — H&P (Signed)
History and Physical    KIMBLY EANES HYW:[id number] DOB: February 06, 1946 DOA: 08/23/2016  PCP: Carol Ada, MD  Patient coming from: Home  I have personally briefly reviewed patient's old medical records in [place]  Chief Complaint: SOB, chest pain  HPI: Natalie Lane is a 71 y.o. female with medical history significant of CHD, cirrhosis with ascites requiring multiple previous admissions for LVP in past, most recently 3 weeks ago on 6/25.  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  Followed by Dr. Michail Sermon.  Patient presents to the ED with c/o SOB, chest pain, abd distention.  She feels like it is time for paracentesis again.  Has been taking lasix and spironolactone.  No fevers, chills.  Has had hypotension in past associated with ascites.   ED Course: Labs most notable for AKI with creat of 2.2, baseline 1.2.   Review of Systems: As per HPI otherwise 10 point review of systems negative.   Past Medical History:  Diagnosis Date  . Anemia   . Ascites   . CHD (congenital heart disease)    a. 1979 s/p septal defect repair (? VSD vs ASD), old op notes not available;  b. 04/2016 Echo: EF 60-65%, no rwma, Gr2 DD, mild AS, mild TR, no PFO (prev repaired).  . Chronic diastolic CHF (congestive heart failure) ([place])    04/2016 Echo: EF 60-65%, no rwma, Gr2 DD.  Marland Kitchen Complication of anesthesia   . 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 04/2016 s/p paracentesis.  . Diabetes mellitus, type 2 ([place])   . Dyspnea   . Edema   . Gastric varices    a. s/p banding.  Marland Kitchen GERD (gastroesophageal reflux disease)   . History of blood transfusion   . Hyperlipidemia   . Normal stress echocardiogram 2008  . Pancreatic adenoma   . Paroxysmal atrial flutter ([place])    a. 11/2009 s/p RFCA.  Marland Kitchen PONV (postoperative nausea and vomiting)    with Cholecystectomy  . Tobacco abuse    a. smokes 4 cigarettes/day.    Past Surgical History:  Procedure Laterality  Date  . ABDOMINAL HYSTERECTOMY    . ASD versus VSD repair  1979   Operative notes not available.  . ATRIAL ABLATION SURGERY  10 /2011  . [place]   x2  . CHOLECYSTECTOMY    . ESOPHAGOGASTRODUODENOSCOPY (EGD) WITH PROPOFOL N/A 11/16/2013   Procedure: ESOPHAGOGASTRODUODENOSCOPY (EGD) WITH PROPOFOL;  Surgeon: Lear Ng, MD;  Location: [facility];  Service: Endoscopy;  Laterality: N/A;  . ESOPHAGOGASTRODUODENOSCOPY (EGD) WITH PROPOFOL N/A 01/23/2014   Procedure: ESOPHAGOGASTRODUODENOSCOPY (EGD) WITH PROPOFOL;  Surgeon: Lear Ng, MD;  Location: [facility];  Service: Endoscopy;  Laterality: N/A;  . GASTRIC VARICES BANDING N/A 11/16/2013   Procedure: GASTRIC VARICES BANDING;  Surgeon: Lear Ng, MD;  Location: [place];  Service: Endoscopy;  Laterality: N/A;  . GASTRIC VARICES BANDING N/A 01/23/2014   Procedure: GASTRIC VARICES BANDING;  Surgeon: Lear Ng, MD;  Location: [facility];  Service: Endoscopy;  Laterality: N/A;  . HERNIA REPAIR    . IR GENERIC HISTORICAL  05/11/2016   IR PARACENTESIS 05/11/2016 Ascencion Dike, PA-C [facility]-INTERV RAD  . IR PARACENTESIS  07/08/2016  . IR PARACENTESIS  08/09/2016  . OTHER SURGICAL HISTORY     partial hysterectomy     reports that she has been smoking Cigarettes.  She has a 18.00 pack-year smoking history. She has never used smokeless  tobacco. She reports that she does not drink alcohol or use drugs.  Allergies  Allergen Reactions  . Monosodium Glutamate Anaphylaxis    MSG  . Red Dye Anaphylaxis  . Erythromycin Other (See Comments)    Reaction:  Migraine   . Penicillins Other (See Comments)    Reaction:  Migraine  Has patient had a PCN reaction causing immediate rash, facial/tongue/throat swelling, SOB or lightheadedness with hypotension: No Has patient had a PCN reaction causing severe rash involving mucus membranes or skin necrosis: No Has patient had a PCN reaction that required hospitalization  No Has patient had a PCN reaction occurring within the last 10 years: No If all of the above answers are "NO", then may proceed with Cephalosporin use.  . Statins Other (See Comments)    Reaction:  Severe leg cramps   . Sulfa Antibiotics Hives  . Glipizide Rash  . Iohexol Rash  . Spironolactone Palpitations and Other (See Comments)    Sweating  . Tape Rash    "caused blisters"    Family History  Problem Relation Age of Onset  . Hypertension Mother   . Kidney disease Sister   . Depression Paternal Grandfather   . Diabetes Brother      Prior to Admission medications   Medication Sig Start Date End Date Taking? Authorizing Provider  amiodarone (PACERONE) 200 MG tablet Take 1 tablet (200 mg total) by mouth daily. 07/16/16  Yes [place]ique, Peter M, MD  diphenhydrAMINE (BENADRYL) 25 MG tablet Take 25 mg by mouth every 6 (six) hours as needed for allergies.   Yes [provider]  furosemide (LASIX) 40 MG tablet Take 20 mg by mouth daily.    Yes [provider]  glimepiride (AMARYL) 2 MG tablet Take 1 tablet (2 mg total) by mouth 2 (two) times daily before a meal. Patient taking differently: Take 1 mg by mouth 2 (two) times daily.  02/26/16  Yes Philemon Kingdom, MD  insulin NPH Human (NOVOLIN N) 100 UNIT/ML injection Inject 20 units twice daily before meals. Patient taking differently: Inject 15-25 Units into the skin See admin instructions. Inject 25 units into the skin in the morning and 15 units into the skin at night 08/10/16  Yes Elayne Snare, MD  lactulose (CHRONULAC) 10 GM/15ML solution Take 10 g by mouth 2 (two) times daily as needed for mild constipation.    Yes [provider]  metFORMIN (GLUCOPHAGE-XR) 500 MG 24 hr tablet Take 1 tablet (500 mg total) by mouth 2 (two) times daily with a meal. 06/25/16  Yes Philemon Kingdom, MD  metoprolol tartrate (LOPRESSOR) 25 MG tablet Take 0.5 tablets (12.5 mg total) by mouth 2 (two) times daily. 06/30/16  Yes Oswald Hillock,  MD  ranitidine (ZANTAC) 150 MG capsule Take 150 mg by mouth daily as needed for heartburn.   Yes [provider]  spironolactone (ALDACTONE) 100 MG tablet Take 1 tablet (100 mg total) by mouth daily. 05/12/16  Yes Verlee Monte, MD    Physical Exam: Vitals:   08/23/16 1842 08/23/16 1845 08/23/16 1900 08/23/16 1915  BP: (!) 105/55 107/62 (!) 105/57 101/79  Pulse: 98 98 96 (!) 101  Resp: 18 (!) 24 (!) 26 (!) 28  Temp:      TempSrc:      SpO2: 96% 97% 96% 96%  Weight:      Height:        Constitutional: NAD, calm, comfortable Eyes: PERRL, lids and conjunctivae normal ENMT: Mucous membranes are moist.  Posterior pharynx clear of any exudate or lesions.Normal dentition.  Neck: normal, supple, no masses, no thyromegaly Respiratory: clear to auscultation bilaterally, no wheezing, no crackles. Normal respiratory effort. No accessory muscle use.  Cardiovascular: Regular rate and rhythm, no murmurs / rubs / gallops. No extremity edema. 2+ pedal pulses. No carotid bruits.  Abdomen: no tenderness, no masses palpated. No hepatosplenomegaly. Bowel sounds positive.  Musculoskeletal: no clubbing / cyanosis. No joint deformity upper and lower extremities. Good ROM, no contractures. Normal muscle tone.  Skin: no rashes, lesions, ulcers. No induration Neurologic: CN 2-12 grossly intact. Sensation intact, DTR normal. Strength 5/5 in all 4.  Psychiatric: Normal judgment and insight. Alert and oriented x 3. Normal mood.    Labs on Admission: I have personally reviewed following labs and imaging studies  CBC:  Recent Labs Lab 08/23/16 1729  WBC 8.4  HGB 13.8  HCT 41.0  MCV 94.0  PLT 671   Basic Metabolic Panel:  Recent Labs Lab 08/23/16 1729  NA 132*  K 5.3*  CL 100*  CO2 19*  GLUCOSE 99  BUN 35*  CREATININE 2.27*  CALCIUM 9.6   GFR: Estimated Creatinine Clearance: 24.7 mL/min (A) (by C-G formula based on SCr of 2.27 mg/dL (H)). Liver Function Tests:  Recent Labs Lab  08/23/16 1729  AST 27  ALT 15  ALKPHOS 46  BILITOT 1.5*  PROT 7.3  ALBUMIN 3.5   No results for input(s): LIPASE, AMYLASE in the last 168 hours. No results for input(s): AMMONIA in the last 168 hours. Coagulation Profile:  Recent Labs Lab 08/23/16 1729  INR 1.24   Cardiac Enzymes: No results for input(s): CKTOTAL, CKMB, CKMBINDEX, TROPONINI in the last 168 hours. BNP (last 3 results) No results for input(s): PROBNP in the last 8760 hours. HbA1C: No results for input(s): HGBA1C in the last 72 hours. CBG:  Recent Labs Lab 08/23/16 1849  GLUCAP 76   Lipid Profile: No results for input(s): CHOL, HDL, LDLCALC, TRIG, CHOLHDL, LDLDIRECT in the last 72 hours. Thyroid Function Tests: No results for input(s): TSH, T4TOTAL, FREET4, T3FREE, THYROIDAB in the last 72 hours. Anemia Panel: No results for input(s): VITAMINB12, FOLATE, FERRITIN, TIBC, IRON, RETICCTPCT in the last 72 hours. Urine analysis:    Component Value Date/Time   COLORURINE YELLOW 06/26/2016 0353   APPEARANCEUR CLEAR 06/26/2016 0353   LABSPEC 1.006 06/26/2016 0353   PHURINE 5.0 06/26/2016 0353   GLUCOSEU NEGATIVE 06/26/2016 0353   HGBUR NEGATIVE 06/26/2016 0353   BILIRUBINUR NEGATIVE 06/26/2016 0353   KETONESUR NEGATIVE 06/26/2016 0353   PROTEINUR NEGATIVE 06/26/2016 0353   NITRITE POSITIVE (A) 06/26/2016 0353   LEUKOCYTESUR SMALL (A) 06/26/2016 0353    Radiological Exams on Admission: Dg Chest 2 View  Result Date: 08/23/2016 CLINICAL DATA:  Shortness of breath and left-sided chest pain EXAM: CHEST  2 VIEW COMPARISON:  06/25/2016 FINDINGS: Low volume chest with bandlike opacities at the bases. No edema, air bronchogram, effusion, or pneumothorax. Normal heart size and stable mediastinal contours. Previous median sternotomy with chronic fracturing of the lower sternal wire. IMPRESSION: 1. No acute finding.  Stable compared to 06/25/2016. 2. Atelectasis or scarring at the bases. Electronically Signed   By:  Monte Fantasia M.D.   On: 08/23/2016 16:48    EKG: Independently reviewed.  Assessment/Plan Principal Problem:   Ascites Active Problems:   Atrial flutter (H[facility])   Decompensated hepatic cirrhosis (H[facility])   AKI (acute kidney injury) ([place])    1. Ascites from cirrhosis - 1. IR drainage  tomorrow 2. Have ordered 50g albumin with drainage as she has received multiple times in past 3. Continue metoprolol 2. H/o PAT / a.flutter last admit - 1. Will keep patient on tele monitor during this admit given trouble with this last time 2. Will leave patient on amiodarone for now that was started in May. 1. Are we sure this is the best / safest drug option for an advanced cirrhosis patient? 2. Will order an amiodarone level. 3. AKI - 1. Likely due to diuretics and decompensated cirrhosis 2. Holding spironolactone 3. Will continue lasix for now 4. IR paracentesis tomorrow 5. Avoid NSAIDS 4. DM2 - 1. Holding metformin and glipizide 2. NPH 10 BID 3. Sensitive scale SSI AC  DVT prophylaxis: SCDs due to IR drainage planned Code Status: Full Family Communication: No family in room Disposition Plan: Home Consults called: None called, IR paracentesis ordered Admission status: Place in obs   [place], [place] Hospitalists Pager [phone]  If 7AM-7PM, please contact day team taking care of patient [email or web address] Password [email or web address]  08/23/2016, 9:13 PM

## 2016-08-23 NOTE — ED Notes (Signed)
cbg was 76

## 2016-08-23 NOTE — ED Triage Notes (Signed)
Pt. Has a hx of liver disease and requires paracentesis.  Pt. s last one was 3 weeks ago.  Abdominal distended and having difficulty breathing.  Pt. Having sob a rest Skin is warm, pink and dry.  Intermittent lt. Side chest pain.

## 2016-08-23 NOTE — ED Notes (Signed)
Patient transported to X-ray 

## 2016-08-24 ENCOUNTER — Observation Stay (HOSPITAL_COMMUNITY): Payer: Medicare Other

## 2016-08-24 ENCOUNTER — Encounter (HOSPITAL_COMMUNITY): Payer: Self-pay | Admitting: Student

## 2016-08-24 DIAGNOSIS — Z9049 Acquired absence of other specified parts of digestive tract: Secondary | ICD-10-CM | POA: Diagnosis not present

## 2016-08-24 DIAGNOSIS — K7290 Hepatic failure, unspecified without coma: Secondary | ICD-10-CM | POA: Diagnosis not present

## 2016-08-24 DIAGNOSIS — R0602 Shortness of breath: Secondary | ICD-10-CM | POA: Diagnosis not present

## 2016-08-24 DIAGNOSIS — Z833 Family history of diabetes mellitus: Secondary | ICD-10-CM | POA: Diagnosis not present

## 2016-08-24 DIAGNOSIS — I5032 Chronic diastolic (congestive) heart failure: Secondary | ICD-10-CM | POA: Diagnosis present

## 2016-08-24 DIAGNOSIS — Z880 Allergy status to penicillin: Secondary | ICD-10-CM | POA: Diagnosis not present

## 2016-08-24 DIAGNOSIS — K760 Fatty (change of) liver, not elsewhere classified: Secondary | ICD-10-CM | POA: Diagnosis present

## 2016-08-24 DIAGNOSIS — K7469 Other cirrhosis of liver: Secondary | ICD-10-CM | POA: Diagnosis present

## 2016-08-24 DIAGNOSIS — I9589 Other hypotension: Secondary | ICD-10-CM | POA: Diagnosis present

## 2016-08-24 DIAGNOSIS — Z90710 Acquired absence of both cervix and uterus: Secondary | ICD-10-CM | POA: Diagnosis not present

## 2016-08-24 DIAGNOSIS — K219 Gastro-esophageal reflux disease without esophagitis: Secondary | ICD-10-CM | POA: Diagnosis present

## 2016-08-24 DIAGNOSIS — I4892 Unspecified atrial flutter: Secondary | ICD-10-CM | POA: Diagnosis present

## 2016-08-24 DIAGNOSIS — L8990 Pressure ulcer of unspecified site, unspecified stage: Secondary | ICD-10-CM | POA: Insufficient documentation

## 2016-08-24 DIAGNOSIS — T502X5A Adverse effect of carbonic-anhydrase inhibitors, benzothiadiazides and other diuretics, initial encounter: Secondary | ICD-10-CM | POA: Diagnosis present

## 2016-08-24 DIAGNOSIS — N183 Chronic kidney disease, stage 3 (moderate): Secondary | ICD-10-CM | POA: Diagnosis present

## 2016-08-24 DIAGNOSIS — Z8249 Family history of ischemic heart disease and other diseases of the circulatory system: Secondary | ICD-10-CM | POA: Diagnosis not present

## 2016-08-24 DIAGNOSIS — F17210 Nicotine dependence, cigarettes, uncomplicated: Secondary | ICD-10-CM | POA: Diagnosis present

## 2016-08-24 DIAGNOSIS — Z794 Long term (current) use of insulin: Secondary | ICD-10-CM | POA: Diagnosis not present

## 2016-08-24 DIAGNOSIS — Z841 Family history of disorders of kidney and ureter: Secondary | ICD-10-CM | POA: Diagnosis not present

## 2016-08-24 DIAGNOSIS — K7460 Unspecified cirrhosis of liver: Secondary | ICD-10-CM | POA: Diagnosis not present

## 2016-08-24 DIAGNOSIS — I483 Typical atrial flutter: Secondary | ICD-10-CM | POA: Diagnosis not present

## 2016-08-24 DIAGNOSIS — Z818 Family history of other mental and behavioral disorders: Secondary | ICD-10-CM | POA: Diagnosis not present

## 2016-08-24 DIAGNOSIS — Z882 Allergy status to sulfonamides status: Secondary | ICD-10-CM | POA: Diagnosis not present

## 2016-08-24 DIAGNOSIS — E1122 Type 2 diabetes mellitus with diabetic chronic kidney disease: Secondary | ICD-10-CM | POA: Diagnosis present

## 2016-08-24 DIAGNOSIS — Z79899 Other long term (current) drug therapy: Secondary | ICD-10-CM | POA: Diagnosis not present

## 2016-08-24 DIAGNOSIS — R188 Other ascites: Secondary | ICD-10-CM | POA: Diagnosis not present

## 2016-08-24 DIAGNOSIS — Z888 Allergy status to other drugs, medicaments and biological substances status: Secondary | ICD-10-CM | POA: Diagnosis not present

## 2016-08-24 DIAGNOSIS — R1084 Generalized abdominal pain: Secondary | ICD-10-CM | POA: Diagnosis not present

## 2016-08-24 DIAGNOSIS — N179 Acute kidney failure, unspecified: Secondary | ICD-10-CM | POA: Diagnosis not present

## 2016-08-24 DIAGNOSIS — E871 Hypo-osmolality and hyponatremia: Secondary | ICD-10-CM | POA: Diagnosis present

## 2016-08-24 HISTORY — PX: IR PARACENTESIS: IMG2679

## 2016-08-24 LAB — GLUCOSE, CAPILLARY
GLUCOSE-CAPILLARY: 208 mg/dL — AB (ref 65–99)
GLUCOSE-CAPILLARY: 217 mg/dL — AB (ref 65–99)
GLUCOSE-CAPILLARY: 188 mg/dL — AB (ref 65–99)
Glucose-Capillary: 43 mg/dL — CL (ref 65–99)
Glucose-Capillary: 52 mg/dL — ABNORMAL LOW (ref 65–99)
Glucose-Capillary: 186 mg/dL — ABNORMAL HIGH (ref 65–99)
Glucose-Capillary: 178 mg/dL — ABNORMAL HIGH (ref 65–99)

## 2016-08-24 LAB — BASIC METABOLIC PANEL
Anion gap: 9 (ref 5–15)
BUN: 37 mg/dL — ABNORMAL HIGH (ref 6–20)
CALCIUM: 8.9 mg/dL (ref 8.9–10.3)
CO2: 20 mmol/L — AB (ref 22–32)
CREATININE: 2.21 mg/dL — AB (ref 0.44–1.00)
Chloride: 102 mmol/L (ref 101–111)
GFR calc Af Amer: 25 mL/min — ABNORMAL LOW (ref >60)
GFR, EST NON AFRICAN AMERICAN: 22 mL/min — AB (ref >60)
GLUCOSE: 54 mg/dL — AB (ref 65–99)
Potassium: 4.9 mmol/L (ref 3.5–5.1)
Sodium: 131 mmol/L — ABNORMAL LOW (ref 135–145)

## 2016-08-24 MED ORDER — LIDOCAINE HCL (PF) 1 % IJ SOLN
INTRAMUSCULAR | Status: AC
  Filled 2016-08-24: qty 30

## 2016-08-24 MED ORDER — MORPHINE SULFATE (PF) 4 MG/ML IV SOLN
4.0000 mg | Freq: Once | INTRAVENOUS | Status: AC
  Administered 2016-08-24: 4 mg via INTRAVENOUS
  Filled 2016-08-24: qty 1

## 2016-08-24 MED ORDER — MORPHINE SULFATE (PF) 2 MG/ML IV SOLN: 2.0000 mg | INTRAVENOUS | Status: DC | PRN

## 2016-08-24 MED ORDER — DEXTROSE 50 % IV SOLN
INTRAVENOUS | Status: AC
  Administered 2016-08-24: 50 mL via INTRAVENOUS
  Filled 2016-08-24: qty 50

## 2016-08-24 MED ORDER — DEXTROSE 50 % IV SOLN
50.0000 mL | Freq: Once | INTRAVENOUS | Status: AC
  Administered 2016-08-24: 50 mL via INTRAVENOUS

## 2016-08-24 NOTE — Progress Notes (Signed)
BS rechecked 15 minutes after orange juice per protocol. CBG  52, breakfast with pt at this time. Primary day nurse rechecking BP and will notify MD regarding Glucose coverage.

## 2016-08-24 NOTE — Consult Note (Signed)
[facility] Gastroenterology Consultation Note  Referring Provider: Dr. Velvet Bathe [facility]) Primary Care Physician:  Carol Ada, MD Primary Gastroenterologist:  Dr. Wilford Corner  Reason for Consultation:  ascites  HPI: Natalie Lane is a 71 y.o. female admitted for refractory ascites and diuretic-induced renal insufficiency.  Has had multiple periodic paracenteses despite refractory management.  Had paracentesis this morning and feels much better.  No abdominal pain, blood in stool, encephalopathy, fevers.   Past Medical History:  Diagnosis Date  . Anemia   . Ascites   . CHD (congenital heart disease)    a. 1979 s/p septal defect repair (? VSD vs ASD), old op notes not available;  b. 04/2016 Echo: EF 60-65%, no rwma, Gr2 DD, mild AS, mild TR, no PFO (prev repaired).  . Chronic diastolic CHF (congestive heart failure) ([place])    04/2016 Echo: EF 60-65%, no rwma, Gr2 DD.  Marland Kitchen Complication of anesthesia   . 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 04/2016 s/p paracentesis.  . Diabetes mellitus, type 2 ([place])   . Dyspnea   . Edema   . Gastric varices    a. s/p banding.  Marland Kitchen GERD (gastroesophageal reflux disease)   . History of blood transfusion   . Hyperlipidemia   . Normal stress echocardiogram 2008  . Pancreatic adenoma   . Paroxysmal atrial flutter ([place])    a. 11/2009 s/p RFCA.  Marland Kitchen PONV (postoperative nausea and vomiting)    with Cholecystectomy  . Tobacco abuse    a. smokes 4 cigarettes/day.    Past Surgical History:  Procedure Laterality Date  . ABDOMINAL HYSTERECTOMY    . ASD versus VSD repair  1979   Operative notes not available.  . ATRIAL ABLATION SURGERY  10 /2011  . [place]   x2  . CHOLECYSTECTOMY    . ESOPHAGOGASTRODUODENOSCOPY (EGD) WITH PROPOFOL N/A 11/16/2013   Procedure: ESOPHAGOGASTRODUODENOSCOPY (EGD) WITH PROPOFOL;  Surgeon: Lear Ng, MD;  Location: [place];  Service: Endoscopy;  Laterality: N/A;  . ESOPHAGOGASTRODUODENOSCOPY (EGD) WITH  PROPOFOL N/A 01/23/2014   Procedure: ESOPHAGOGASTRODUODENOSCOPY (EGD) WITH PROPOFOL;  Surgeon: Lear Ng, MD;  Location: [place];  Service: Endoscopy;  Laterality: N/A;  . GASTRIC VARICES BANDING N/A 11/16/2013   Procedure: GASTRIC VARICES BANDING;  Surgeon: Lear Ng, MD;  Location: [place];  Service: Endoscopy;  Laterality: N/A;  . GASTRIC VARICES BANDING N/A 01/23/2014   Procedure: GASTRIC VARICES BANDING;  Surgeon: Lear Ng, MD;  Location: [place];  Service: Endoscopy;  Laterality: N/A;  . HERNIA REPAIR    . IR GENERIC HISTORICAL  05/11/2016   IR PARACENTESIS 05/11/2016 Ascencion Dike, PA-C [facility]-INTERV RAD  . IR PARACENTESIS  07/08/2016  . IR PARACENTESIS  08/09/2016  . IR PARACENTESIS  08/24/2016  . OTHER SURGICAL HISTORY     partial hysterectomy    Prior to Admission medications   Medication Sig Start Date End Date Taking? Authorizing Provider  amiodarone (PACERONE) 200 MG tablet Take 1 tablet (200 mg total) by mouth daily. 07/16/16  Yes Martinique, Peter M, MD  diphenhydrAMINE (BENADRYL) 25 MG tablet Take 25 mg by mouth every 6 (six) hours as needed for allergies.   Yes [provider]  furosemide (LASIX) 40 MG tablet Take 20 mg by mouth daily.    Yes [provider]  glimepiride (AMARYL) 2 MG tablet Take 1 tablet (2 mg total) by mouth 2 (two) times daily before a meal. Patient taking differently: Take 1 mg by mouth  2 (two) times daily.  02/26/16  Yes Philemon Kingdom, MD  insulin NPH Human (NOVOLIN N) 100 UNIT/ML injection Inject 20 units twice daily before meals. Patient taking differently: Inject 15-25 Units into the skin See admin instructions. Inject 25 units into the skin in the morning and 15 units into the skin at night 08/10/16  Yes Elayne Snare, MD  lactulose (CHRONULAC) 10 GM/15ML solution Take 10 g by mouth 2 (two) times daily as needed for mild constipation.    Yes [provider]  metFORMIN (GLUCOPHAGE-XR) 500 MG 24  hr tablet Take 1 tablet (500 mg total) by mouth 2 (two) times daily with a meal. 06/25/16  Yes Philemon Kingdom, MD  metoprolol tartrate (LOPRESSOR) 25 MG tablet Take 0.5 tablets (12.5 mg total) by mouth 2 (two) times daily. 06/30/16  Yes Oswald Hillock, MD  ranitidine (ZANTAC) 150 MG capsule Take 150 mg by mouth daily as needed for heartburn.   Yes [provider]  spironolactone (ALDACTONE) 100 MG tablet Take 1 tablet (100 mg total) by mouth daily. 05/12/16  Yes Verlee Monte, MD    Current Facility-Administered Medications  Medication Dose Route Frequency Provider Last Rate Last Dose  . amiodarone (PACERONE) tablet 200 mg  200 mg Oral Daily Jennette Kettle M, DO   200 mg at 08/24/16 1256  . diphenhydrAMINE (BENADRYL) capsule 25 mg  25 mg Oral Q6H PRN Etta Quill, DO      . famotidine (PEPCID) tablet 20 mg  20 mg Oral Daily Jennette Kettle M, DO   20 mg at 08/24/16 1256  . furosemide (LASIX) tablet 20 mg  20 mg Oral Daily Jennette Kettle M, DO   20 mg at 08/24/16 1257  . insulin aspart (novoLOG) injection 0-9 Units  0-9 Units Subcutaneous TID WC Etta Quill, DO   2 Units at 08/24/16 1255  . lactulose (CHRONULAC) 10 GM/15ML solution 10 g  10 g Oral BID Jennette Kettle M, DO   10 g at 08/24/16 1256  . lidocaine (PF) (XYLOCAINE) 1 % injection           . metoprolol tartrate (LOPRESSOR) tablet 12.5 mg  12.5 mg Oral BID Jennette Kettle M, DO   12.5 mg at 08/24/16 0053  . morphine 2 MG/ML injection 2 mg  2 mg Intravenous Q4H PRN Schorr, Rhetta Mura, NP      . ondansetron (ZOFRAN) injection 4 mg  4 mg Intravenous Q6H PRN Etta Quill, DO      . sodium chloride flush (NS) 0.9 % injection 3 mL  3 mL Intravenous Q12H Jennette Kettle M, DO   3 mL at 08/24/16 1257    Allergies as of 08/23/2016 - Review Complete 08/23/2016  Allergen Reaction Noted  . Monosodium glutamate Anaphylaxis 05/10/2016  . Red dye Anaphylaxis 06/25/2010  . Erythromycin Other (See Comments) 06/25/2010  .  Penicillins Other (See Comments) 06/25/2010  . Statins Other (See Comments) 01/23/2014  . Sulfa antibiotics Hives 08/30/2010  . Glipizide Rash 09/19/2014  . Iohexol Rash 09/01/2011  . Spironolactone Palpitations and Other (See Comments) 05/10/2016  . Tape Rash 07/28/2016    Family History  Problem Relation Age of Onset  . Hypertension Mother   . Kidney disease Sister   . Depression Paternal Grandfather   . Diabetes Brother     Social History   Social History  . Marital status: Married    Spouse name: N/A  . Number of children: 3  . Years of education: N/A  Occupational History  . Office management [employer]   Social History Main Topics  . Smoking status: Current Every Day Smoker    Packs/day: 0.50    Years: 36.00    Types: Cigarettes  . Smokeless tobacco: Never Used     Comment: was smoking 4 cigs/day prior to admission.  . Alcohol use No  . Drug use: No  . Sexual activity: Not on file   Other Topics Concern  . Not on file   Social History Narrative   Lives in [place] with boyfriend.  Does not routinely exercise.    Review of Systems: As per HPI, all others negative  Physical Exam: Vital signs in last 24 hours: Temp:  [97.7 F (36.5 C)-98.2 F (36.8 C)] 97.7 F (36.5 C) (07/10 1249) Pulse Rate:  [78-110] 83 (07/10 1249) Resp:  [14-28] 18 (07/10 1249) BP: (81-133)/(24-79) 83/43 (07/10 1249) SpO2:  [94 %-98 %] 98 % (07/10 1249) Weight:  [86.5 kg (190 lb 12.8 oz)-88.9 kg (196 lb)] 86.5 kg (190 lb 12.8 oz) (07/10 0600) Last BM Date: 08/23/16 General:   Overweight, NAD HEENT:  No icterus; no jaundice ABD:  Protuberant, residual ascites remains NEURO:  No encephalopathy  Lab Results:  Recent Labs  08/23/16 1729  WBC 8.4  HGB 13.8  HCT 41.0  PLT 220   BMET  Recent Labs  08/23/16 1729 08/24/16 0617  NA 132* 131*  K 5.3* 4.9  CL 100* 102  CO2 19* 20*  GLUCOSE 99 54*  BUN 35* 37*  CREATININE 2.27* 2.21*  CALCIUM 9.6 8.9    LFT  Recent Labs  08/23/16 1729  PROT 7.3  ALBUMIN 3.5  AST 27  ALT 15  ALKPHOS 46  BILITOT 1.5*  BILIDIR 0.4  IBILI 1.1*   PT/INR  Recent Labs  08/23/16 1729  LABPROT 15.7*  INR 1.24    Studies/Results: Dg Chest 2 View  Result Date: 08/23/2016 CLINICAL DATA:  Shortness of breath and left-sided chest pain EXAM: CHEST  2 VIEW COMPARISON:  06/25/2016 FINDINGS: Low volume chest with bandlike opacities at the bases. No edema, air bronchogram, effusion, or pneumothorax. Normal heart size and stable mediastinal contours. Previous median sternotomy with chronic fracturing of the lower sternal wire. IMPRESSION: 1. No acute finding.  Stable compared to 06/25/2016. 2. Atelectasis or scarring at the bases. Electronically Signed   By: Monte Fantasia M.D.   On: 08/23/2016 16:48   Ir Paracentesis  Result Date: 08/24/2016 INDICATION: Patient with history of ascites. Request is made for therapeutic paracentesis. Patient to receive albumin postprocedure. EXAM: ULTRASOUND GUIDED THERAPEUTIC PARACENTESIS MEDICATIONS: 10 mL 1% lidocaine COMPLICATIONS: None immediate. PROCEDURE: Informed written consent was obtained from the patient after a discussion of the risks, benefits and alternatives to treatment. A timeout was performed prior to the initiation of the procedure. Initial ultrasound scanning demonstrates a large amount of ascites within the right lateral abdomen. The right lateral abdomen was prepped and draped in the usual sterile fashion. 1% lidocaine was used for local anesthesia. Following this, a 19 gauge, 7-cm, Yueh catheter was introduced. An ultrasound image was saved for documentation purposes. The paracentesis was performed. The catheter was removed and a dressing was applied. The patient tolerated the procedure well without immediate post procedural complication. FINDINGS: A total of approximately 6.0 liters of chylous fluid was removed. IMPRESSION: Successful ultrasound-guided  therapeutic paracentesis yielding 6.0 liters of peritoneal fluid. Read by:  Brynda Greathouse PA-C Electronically Signed   By: Jenness Corner.D.  On: 08/24/2016 13:12   Impression:  1.  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 2.  Acute renal insufficiency, likely diuretic-related, do not suspect hepatorenal syndrome.  Plan:  1.  Patient feels better after paracentesis; long-term options would include serial scheduled paracenteses versus TIPS. 2.  Low sodium diet.  1.5 liter daily fluid restriction. 3.  Holding diuretics for now; if renal function improves, may try low-dose diuretics. 4.  If patient does ok today, could likely consider discharge home tomorrow from GI perspective, with close outpatient follow-up with Dr. Michail Sermon. 5.  [facility] will follow.   LOS: 0 days   Erick Oxendine M  08/24/2016, 1:58 PM  Pager [phone] If no answer or after 5 PM call [phone]

## 2016-08-24 NOTE — Progress Notes (Addendum)
SBP 81. CBG 43. Symptomatic with tremors.  Per hypoglycemic protocol, pt awake and able to take po. Orange juice provided along with graham crackers and peanut butter. Report given to nurse coming on.

## 2016-08-24 NOTE — H&P (Signed)
PROGRESS NOTE    Natalie Lane  INO:[id number] DOB: 04/24/1945 DOA: 08/23/2016 PCP: Carol Ada, MD   Brief Narrative:   71 y.o. female with medical history significant of CHD, cirrhosis with ascites requiring multiple previous admissions for LVP in past, most recently 3 weeks ago on 6/25.  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  Followed by Dr. Michail Sermon.  Patient presents to the ED with c/o SOB, chest pain, abd distention  Assessment & Plan:   Principal Problem:   Ascites - Consulted [facility] 7/10 - Plan is for therapeutic paracentesis with albumin - pt is on lasix. Spironolactone held due to ARF.   Active Problems:   Atrial flutter [facility]) - Cardiology consult note on 06/29/16 by Dr. Acie Fredrickson reviewed and given low blood presures B blocker were not able to be used. Patient placed on amiodarone despite liver history. Liver enzymes wnl on 08/24/16 labs    AKI (acute kidney injury) ([place]) - Holding spironolactone - Will reassess S creatinine next am.   DVT prophylaxis: SCD's Code Status: Full Family Communication: None at bedside. Pt did not want me to update anyone in particular when asked directly Disposition Plan: Pending improvement in condition and GI recommendations   Consultants:   GI   Procedures: paracentesis   Antimicrobials: None   Subjective: Pt has no new complaints. Complaining of abdominal discomfort and some mild sob  Objective: Vitals:   08/24/16 1151 08/24/16 1157 08/24/16 1213 08/24/16 1249  BP: (!) 90/50 (!) 92/47 (!) 88/24 (!) 83/43  Pulse:  78 79 83  Resp:    18  Temp:    97.7 F (36.5 C)  TempSrc:    Oral  SpO2:    98%  Weight:      Height:        Intake/Output Summary (Last 24 hours) at 08/24/16 1329 Last data filed at 08/24/16 0921  Gross per 24 hour  Intake              100 ml  Output              111 ml  Net              -11 ml   Filed Weights   08/23/16 1547 08/23/16 2351  08/24/16 0600  Weight: 88.9 kg (196 lb) 87 kg (191 lb 11.2 oz) 86.5 kg (190 lb 12.8 oz)    Examination:  General exam: Appears calm and comfortable, in nad.  Respiratory system: Clear to auscultation. Respiratory effort normal. No wheezes Cardiovascular system: S1 & S2 heard, RRR. No JVD, murmurs, gallops Gastrointestinal system: Abdomen is distended, + bowel sounds, no guarding Central nervous system: Alert and oriented. No focal neurological deficits. Extremities: Symmetric 5 x 5 power. Skin: No rashes, lesions or ulcers, on limited exam. Psychiatry: Mood & affect appropriate.   Data Reviewed: I have personally reviewed following labs and imaging studies  CBC:  Recent Labs Lab 08/23/16 1729  WBC 8.4  HGB 13.8  HCT 41.0  MCV 94.0  PLT 096   Basic Metabolic Panel:  Recent Labs Lab 08/23/16 1729 08/24/16 0617  NA 132* 131*  K 5.3* 4.9  CL 100* 102  CO2 19* 20*  GLUCOSE 99 54*  BUN 35* 37*  CREATININE 2.27* 2.21*  CALCIUM 9.6 8.9   GFR: Estimated Creatinine Clearance: 25 mL/min (A) (by C-G formula based on SCr of 2.21 mg/dL (H)). Liver Function Tests:  Recent Labs Lab 08/23/16  1729  AST 27  ALT 15  ALKPHOS 46  BILITOT 1.5*  PROT 7.3  ALBUMIN 3.5   No results for input(s): LIPASE, AMYLASE in the last 168 hours. No results for input(s): AMMONIA in the last 168 hours. Coagulation Profile:  Recent Labs Lab 08/23/16 1729  INR 1.24   Cardiac Enzymes: No results for input(s): CKTOTAL, CKMB, CKMBINDEX, TROPONINI in the last 168 hours. BNP (last 3 results) No results for input(s): PROBNP in the last 8760 hours. HbA1C: No results for input(s): HGBA1C in the last 72 hours. CBG:  Recent Labs Lab 08/24/16 0741 08/24/16 0759 08/24/16 0823 08/24/16 0930 08/24/16 1247  GLUCAP 43* 52* 208* 217* 188*   Lipid Profile: No results for input(s): CHOL, HDL, LDLCALC, TRIG, CHOLHDL, LDLDIRECT in the last 72 hours. Thyroid Function Tests: No results for  input(s): TSH, T4TOTAL, FREET4, T3FREE, THYROIDAB in the last 72 hours. Anemia Panel: No results for input(s): VITAMINB12, FOLATE, FERRITIN, TIBC, IRON, RETICCTPCT in the last 72 hours. Sepsis Labs: No results for input(s): PROCALCITON, LATICACIDVEN in the last 168 hours.  No results found for this or any previous visit (from the past 240 hour(s)).       Radiology Studies: Dg Chest 2 View  Result Date: 08/23/2016 CLINICAL DATA:  Shortness of breath and left-sided chest pain EXAM: CHEST  2 VIEW COMPARISON:  06/25/2016 FINDINGS: Low volume chest with bandlike opacities at the bases. No edema, air bronchogram, effusion, or pneumothorax. Normal heart size and stable mediastinal contours. Previous median sternotomy with chronic fracturing of the lower sternal wire. IMPRESSION: 1. No acute finding.  Stable compared to 06/25/2016. 2. Atelectasis or scarring at the bases. Electronically Signed   By: Monte Fantasia M.D.   On: 08/23/2016 16:48   Ir Paracentesis  Result Date: 08/24/2016 INDICATION: Patient with history of ascites. Request is made for therapeutic paracentesis. Patient to receive albumin postprocedure. EXAM: ULTRASOUND GUIDED THERAPEUTIC PARACENTESIS MEDICATIONS: 10 mL 1% lidocaine COMPLICATIONS: None immediate. PROCEDURE: Informed written consent was obtained from the patient after a discussion of the risks, benefits and alternatives to treatment. A timeout was performed prior to the initiation of the procedure. Initial ultrasound scanning demonstrates a large amount of ascites within the right lateral abdomen. The right lateral abdomen was prepped and draped in the usual sterile fashion. 1% lidocaine was used for local anesthesia. Following this, a 19 gauge, 7-cm, Yueh catheter was introduced. An ultrasound image was saved for documentation purposes. The paracentesis was performed. The catheter was removed and a dressing was applied. The patient tolerated the procedure well without immediate  post procedural complication. FINDINGS: A total of approximately 6.0 liters of chylous fluid was removed. IMPRESSION: Successful ultrasound-guided therapeutic paracentesis yielding 6.0 liters of peritoneal fluid. Read by:  Brynda Greathouse PA-C Electronically Signed   By: Aletta Edouard M.D.   On: 08/24/2016 13:12    Scheduled Meds: . amiodarone  200 mg Oral Daily  . famotidine  20 mg Oral Daily  . furosemide  20 mg Oral Daily  . insulin aspart  0-9 Units Subcutaneous TID WC  . lactulose  10 g Oral BID  . lidocaine (PF)      . metoprolol tartrate  12.5 mg Oral BID  . sodium chloride flush  3 mL Intravenous Q12H   Continuous Infusions:   LOS: 0 days    Time spent: > 35 minutes  Velvet Bathe, MD [facility] Pager [phone]  If 7PM-7AM, please contact night-coverage [email or web address] Password [email or web address] 08/24/2016, 1:29 PM

## 2016-08-24 NOTE — Procedures (Signed)
PROCEDURE SUMMARY:  Successful US guided paracentesis from right lateral abdomen.  Yielded 6.0 liters of chylous fluid.  No immediate complications although patient does have variations in BP during procedure. Albumin was hung and procedure was stopped at 6 liters which is her most recent tolerable volume removed. Pt tolerated well.   Specimen was not sent for labs.  Docia Barrier PA-C 08/24/2016 1:09 PM

## 2016-08-25 DIAGNOSIS — N179 Acute kidney failure, unspecified: Secondary | ICD-10-CM

## 2016-08-25 DIAGNOSIS — K7290 Hepatic failure, unspecified without coma: Secondary | ICD-10-CM

## 2016-08-25 DIAGNOSIS — I483 Typical atrial flutter: Secondary | ICD-10-CM

## 2016-08-25 DIAGNOSIS — R188 Other ascites: Secondary | ICD-10-CM

## 2016-08-25 LAB — BASIC METABOLIC PANEL
ANION GAP: 10 (ref 5–15)
BUN: 31 mg/dL — AB (ref 6–20)
CALCIUM: 8.7 mg/dL — AB (ref 8.9–10.3)
CO2: 19 mmol/L — AB (ref 22–32)
CREATININE: 1.95 mg/dL — AB (ref 0.44–1.00)
Chloride: 104 mmol/L (ref 101–111)
GFR calc Af Amer: 29 mL/min — ABNORMAL LOW (ref >60)
GFR, EST NON AFRICAN AMERICAN: 25 mL/min — AB (ref >60)
GLUCOSE: 118 mg/dL — AB (ref 65–99)
Potassium: 4.8 mmol/L (ref 3.5–5.1)
Sodium: 133 mmol/L — ABNORMAL LOW (ref 135–145)

## 2016-08-25 LAB — AMIODARONE LEVEL
AMIODARONE LVL: 0.6 ug/mL — AB (ref 1.0–2.5)
N-DESETHYL-AMIODARONE: 0.6 ug/mL — AB (ref 1.0–2.5)

## 2016-08-25 LAB — CBC
HCT: 36.6 % (ref 36.0–46.0)
HEMOGLOBIN: 12.1 g/dL (ref 12.0–15.0)
MCH: 31.1 pg (ref 26.0–34.0)
MCHC: 33.1 g/dL (ref 30.0–36.0)
MCV: 94.1 fL (ref 78.0–100.0)
Platelets: 188 10*3/uL (ref 150–400)
RBC: 3.89 MIL/uL (ref 3.87–5.11)
RDW: 15.7 % — ABNORMAL HIGH (ref 11.5–15.5)
WBC: 6.8 10*3/uL (ref 4.0–10.5)

## 2016-08-25 LAB — GLUCOSE, CAPILLARY: GLUCOSE-CAPILLARY: 137 mg/dL — AB (ref 65–99)

## 2016-08-25 MED ORDER — IOPAMIDOL (ISOVUE-300) INJECTION 61%
INTRAVENOUS | Status: AC
  Filled 2016-08-25: qty 50

## 2016-08-25 NOTE — Consult Note (Signed)
   [facility] CM Inpatient Consult   08/25/2016  Natalie Lane 1945/11/16 [id number]  Came by to speak with the patient and patient had already discharged.  Patient was reviewed for 3 admissions in the past 6 months in the Medicare ACO.  Dr. Carol Ada with [facility] who does the patient's transition of care follow up for calls and appointments. No other needs noted by documentation.  For questions, please contact:  Natividad Brood, RN BSN [place] [facility] Liaison  [phone] business mobile phone Toll free office [phone]

## 2016-08-25 NOTE — Progress Notes (Signed)
Subjective: Feels better after paracentesis. Abd little sore last night, resolved.   Objective: Vital signs in last 24 hours: Temp:  [97.7 F (36.5 C)-98.5 F (36.9 C)] 98.5 F (36.9 C) (07/11 0412) Pulse Rate:  [63-92] 80 (07/11 0412) Resp:  [18] 18 (07/11 0412) BP: (76-102)/(24-67) 95/44 (07/11 0412) SpO2:  [95 %-98 %] 97 % (07/11 0412) Weight:  [81.8 kg (180 lb 4.8 oz)] 81.8 kg (180 lb 4.8 oz) (07/11 0230) Weight change: -7.121 kg (-15 lb 11.2 oz) Last BM Date: 08/23/16  PE: GEN:  NAD, overweight NEURO:  No encephalopathy ABD:  Moderate ascites EXT:  NO edema  Lab Results: Paracentesis saag > 1.1; no sbp CBC    Component Value Date/Time   WBC 6.8 08/25/2016 0428   RBC 3.89 08/25/2016 0428   HGB 12.1 08/25/2016 0428   HCT 36.6 08/25/2016 0428   PLT 188 08/25/2016 0428   MCV 94.1 08/25/2016 0428   MCH 31.1 08/25/2016 0428   MCHC 33.1 08/25/2016 0428   RDW 15.7 (H) 08/25/2016 0428   LYMPHSABS 1.4 06/26/2016 0640   MONOABS 0.7 06/26/2016 0640   EOSABS 0.2 06/26/2016 0640   BASOSABS 0.0 06/26/2016 0640   CMP     Component Value Date/Time   NA 133 (L) 08/25/2016 0428   NA 133 (A) 02/28/2014 0501   K 4.8 08/25/2016 0428   CL 104 08/25/2016 0428   CO2 19 (L) 08/25/2016 0428   GLUCOSE 118 (H) 08/25/2016 0428   BUN 31 (H) 08/25/2016 0428   BUN 20 02/28/2014   CREATININE 1.95 (H) 08/25/2016 0428   CALCIUM 8.7 (L) 08/25/2016 0428   PROT 7.3 08/23/2016 1729   ALBUMIN 3.5 08/23/2016 1729   AST 27 08/23/2016 1729   ALT 15 08/23/2016 1729   ALKPHOS 46 08/23/2016 1729   BILITOT 1.5 (H) 08/23/2016 1729   GFRNONAA 25 (L) 08/25/2016 0428   GFRAA 29 (L) 08/25/2016 0428   Assessment:  1.  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 2.  Acute renal insufficiency, likely diuretic-related, improving, do not suspect hepatorenal syndrome.  Plan:  1.  1.5 liter daily fluid restriction. 2.  2 gram daily sodium restricted diet. 3.  Continue  to hold diuretics. 4.  OK to d/c from GI perspective today; will check BMP at our office Friday 7/13, might be able to restart low-dose diuretics then. 5.  Follow-up with Dr. Michail Sermon as outpatient is to be arranged. 6.  Will sign-off; please call with questions; thank you for the consultation.   Landry Dyke 08/25/2016, 9:39 AM   Pager [phone] If no answer or after 5 PM call [phone]

## 2016-08-25 NOTE — Discharge Instructions (Signed)
Follow with [facility] in 2 days  Please get a complete blood count and chemistry panel checked by your Primary MD at your next visit, and again as instructed by your Primary MD. Please get your medications reviewed and adjusted by your Primary MD.  Please request your Primary MD to go over all Hospital Tests and Procedure/Radiological results at the follow up, please get all Hospital records sent to your Prim MD by signing hospital release before you go home.  If you had Pneumonia of Lung problems at the Hospital: Please get a 2 view Chest X ray done in 6-8 weeks after hospital discharge or sooner if instructed by your Primary MD.  If you have Congestive Heart Failure: Please call your Cardiologist or Primary MD anytime you have any of the following symptoms:  1) 3 pound weight gain in 24 hours or 5 pounds in 1 week  2) shortness of breath, with or without a dry hacking cough  3) swelling in the hands, feet or stomach  4) if you have to sleep on extra pillows at night in order to breathe  Follow cardiac low salt diet and 1.5 lit/day fluid restriction.  If you have diabetes Accuchecks 4 times/day, Once in AM empty stomach and then before each meal. Log in all results and show them to your primary doctor at your next visit. If any glucose reading is under 80 or above 300 call your primary MD immediately.  If you have Seizure/Convulsions/Epilepsy: Please do not drive, operate heavy machinery, participate in activities at heights or participate in high speed sports until you have seen by Primary MD or a Neurologist and advised to do so again.  If you had Gastrointestinal Bleeding: Please ask your Primary MD to check a complete blood count within one week of discharge or at your next visit. Your endoscopic/colonoscopic biopsies that are pending at the time of discharge, will also need to followed by your Primary MD.  Get Medicines reviewed and adjusted. Please take all your medications  with you for your next visit with your Primary MD  Please request your Primary MD to go over all hospital tests and procedure/radiological results at the follow up, please ask your Primary MD to get all Hospital records sent to his/her office.  If you experience worsening of your admission symptoms, develop shortness of breath, life threatening emergency, suicidal or homicidal thoughts you must seek medical attention immediately by calling 911 or calling your MD immediately  if symptoms less severe.  You must read complete instructions/literature along with all the possible adverse reactions/side effects for all the Medicines you take and that have been prescribed to you. Take any new Medicines after you have completely understood and accpet all the possible adverse reactions/side effects.   Do not drive or operate heavy machinery when taking Pain medications.   Do not take more than prescribed Pain, Sleep and Anxiety Medications  Special Instructions: If you have smoked or chewed Tobacco  in the last 2 yrs please stop smoking, stop any regular Alcohol  and or any Recreational drug use.  Wear Seat belts while driving.  Please note You were cared for by a hospitalist during your hospital stay. If you have any questions about your discharge medications or the care you received while you were in the hospital after you are discharged, you can call the unit and asked to speak with the hospitalist on call if the hospitalist that took care of you is not available. Once  you are discharged, your primary care physician will handle any further medical issues. Please note that NO REFILLS for any discharge medications will be authorized once you are discharged, as it is imperative that you return to your primary care physician (or establish a relationship with a primary care physician if you do not have one) for your aftercare needs so that they can reassess your need for medications and monitor your lab  values.  You can reach the hospitalist office at phone [phone] or fax [phone]   If you do not have a primary care physician, you can call [phone] for a physician referral.  Activity: As tolerated with Full fall precautions use walker/cane & assistance as needed  Diet: low sodium  Disposition Home

## 2016-08-25 NOTE — Progress Notes (Signed)
Discharge instructions printed and reviewed with patient, and copy given for them to take home. All questions addressed at this time. New prescriptions reviewed. IV and tele box removed. Room searched for patient belongings, and confirmed with patient that all valuables were accounted for. Staff assisted patient to dress, then staff escorted patient to discharge via wheelchair.

## 2016-08-25 NOTE — Discharge Summary (Signed)
Physician Discharge Summary  Natalie Lane RFX:[id number] DOB: 10/15/1945 DOA: 08/23/2016  PCP: Carol Ada, MD  Admit date: 08/23/2016 Discharge date: 08/25/2016  Admitted From: home Disposition:  home  Recommendations for Outpatient Follow-up:  1. Follow up with [facility] gastroenterology in 2 days 2. Hold diuretics on discharge, these are to be resumed based on the blood work in GI office in 2 days  Discharge Condition: stable CODE STATUS: Full code Diet recommendation: low salt  HPI: Per Dr. Wendee Beavers, 71 y.o.femalewith medical history significant of CHD, cirrhosis with ascites requiring multiple previous admissions for LVP in past, most recently 3 weeks ago on 6/25. 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 Followed by Dr. Michail Sermon. Patient presents to the ED with c/o SOB, chest pain, abd distention  Hospital Course: Discharge Diagnoses:  Principal Problem:   Ascites Active Problems:   Atrial flutter (HCC)   Decompensated hepatic cirrhosis (HCC)   AKI (acute kidney injury) ([place])   Cryptogenic cirrhosis / NAFLD mediated, decompensated, complicated by recurrent ascites.  Interventional radiology was consulted, and patient underwent 6 L paracentesis with improvement in her symptoms.  Gastroenterology was consulted and have follow patient while hospitalized as well. Acute kidney injury on probable chronic kidney disease stage III -Patient has been having worsening creatinine in the last couple of months, likely diuretic induced.  Her furosemide and spironolactone were held, and her creatinine improved.  She will need to be discharged off of her diuretics and will have close follow-up in 2 days in GI clinic for repeat blood work per [employer] History of a flutter -currently in sinus, continue amiodarone per cardiology, continue metoprolol.  Due to chronic hypotension unable to uptitrate medications more.  She is not a candidate  for anticoagulation due to cirrhosis and gastric varices per cardiology. Hyponatremia -in the setting of fluid overload, stable DM -resume home medications   Discharge Instructions   Allergies as of 08/25/2016      Reactions   Monosodium Glutamate Anaphylaxis   MSG   Red Dye Anaphylaxis   Erythromycin Other (See Comments)   Reaction:  Migraine    Penicillins Other (See Comments)   Reaction:  Migraine  Has patient had a PCN reaction causing immediate rash, facial/tongue/throat swelling, SOB or lightheadedness with hypotension: No Has patient had a PCN reaction causing severe rash involving mucus membranes or skin necrosis: No Has patient had a PCN reaction that required hospitalization No Has patient had a PCN reaction occurring within the last 10 years: No If all of the above answers are "NO", then may proceed with Cephalosporin use.   Statins Other (See Comments)   Reaction:  Severe leg cramps    Sulfa Antibiotics Hives   Glipizide Rash   Iohexol Rash   Spironolactone Palpitations, Other (See Comments)   Sweating   Tape Rash   "caused blisters"      Medication List    STOP taking these medications   furosemide 40 MG tablet Commonly known as:  LASIX   spironolactone 100 MG tablet Commonly known as:  ALDACTONE     TAKE these medications   amiodarone 200 MG tablet Commonly known as:  PACERONE Take 1 tablet (200 mg total) by mouth daily.   diphenhydrAMINE 25 MG tablet Commonly known as:  BENADRYL Take 25 mg by mouth every 6 (six) hours as needed for allergies.   glimepiride 2 MG tablet Commonly known as:  AMARYL Take 1 tablet (2 mg total) by  mouth 2 (two) times daily before a meal. What changed:  how much to take  when to take this   insulin NPH Human 100 UNIT/ML injection Commonly known as:  NOVOLIN N Inject 20 units twice daily before meals. What changed:  how much to take  how to take this  when to take this  additional instructions   lactulose  10 GM/15ML solution Commonly known as:  CHRONULAC Take 10 g by mouth 2 (two) times daily as needed for mild constipation.   metFORMIN 500 MG 24 hr tablet Commonly known as:  GLUCOPHAGE-XR Take 1 tablet (500 mg total) by mouth 2 (two) times daily with a meal.   metoprolol tartrate 25 MG tablet Commonly known as:  LOPRESSOR Take 0.5 tablets (12.5 mg total) by mouth 2 (two) times daily.   ranitidine 150 MG capsule Commonly known as:  ZANTAC Take 150 mg by mouth daily as needed for heartburn.      Follow-up Information    Wilford Corner, MD. Go on 09/13/2016.   Specialty:  Gastroenterology Why:  @11 :00am Contact information: [address] [place] [address] [phone]        Carol Ada, MD. Go on 08/31/2016.   Specialty:  Family Medicine Why:  @10 :45am Contact information: [place] [address] [phone]          Allergies  Allergen Reactions  . Monosodium Glutamate Anaphylaxis    MSG  . Red Dye Anaphylaxis  . Erythromycin Other (See Comments)    Reaction:  Migraine   . Penicillins Other (See Comments)    Reaction:  Migraine  Has patient had a PCN reaction causing immediate rash, facial/tongue/throat swelling, SOB or lightheadedness with hypotension: No Has patient had a PCN reaction causing severe rash involving mucus membranes or skin necrosis: No Has patient had a PCN reaction that required hospitalization No Has patient had a PCN reaction occurring within the last 10 years: No If all of the above answers are "NO", then may proceed with Cephalosporin use.  . Statins Other (See Comments)    Reaction:  Severe leg cramps   . Sulfa Antibiotics Hives  . Glipizide Rash  . Iohexol Rash  . Spironolactone Palpitations and Other (See Comments)    Sweating  . Tape Rash    "caused blisters"    Consultations:  IR  GI  Procedures/Studies:  Dg Chest 2 View  Result Date: 08/23/2016 CLINICAL DATA:   Shortness of breath and left-sided chest pain EXAM: CHEST  2 VIEW COMPARISON:  06/25/2016 FINDINGS: Low volume chest with bandlike opacities at the bases. No edema, air bronchogram, effusion, or pneumothorax. Normal heart size and stable mediastinal contours. Previous median sternotomy with chronic fracturing of the lower sternal wire. IMPRESSION: 1. No acute finding.  Stable compared to 06/25/2016. 2. Atelectasis or scarring at the bases. Electronically Signed   By: Monte Fantasia M.D.   On: 08/23/2016 16:48   Ir Paracentesis  Result Date: 08/24/2016 INDICATION: Patient with history of ascites. Request is made for therapeutic paracentesis. Patient to receive albumin postprocedure. EXAM: ULTRASOUND GUIDED THERAPEUTIC PARACENTESIS MEDICATIONS: 10 mL 1% lidocaine COMPLICATIONS: None immediate. PROCEDURE: Informed written consent was obtained from the patient after a discussion of the risks, benefits and alternatives to treatment. A timeout was performed prior to the initiation of the procedure. Initial ultrasound scanning demonstrates a large amount of ascites within the right lateral abdomen. The right lateral abdomen was prepped and draped in the usual  sterile fashion. 1% lidocaine was used for local anesthesia. Following this, a 19 gauge, 7-cm, Yueh catheter was introduced. An ultrasound image was saved for documentation purposes. The paracentesis was performed. The catheter was removed and a dressing was applied. The patient tolerated the procedure well without immediate post procedural complication. FINDINGS: A total of approximately 6.0 liters of chylous fluid was removed. IMPRESSION: Successful ultrasound-guided therapeutic paracentesis yielding 6.0 liters of peritoneal fluid. Read by:  Brynda Greathouse PA-C Electronically Signed   By: Aletta Edouard M.D.   On: 08/24/2016 13:12   Ir Paracentesis  Result Date: 08/09/2016 INDICATION: Patient with recurrent ascites. Request is made for therapeutic  paracentesis of up to 6 L maximum. EXAM: ULTRASOUND GUIDED THERAPEUTIC PARACENTESIS MEDICATIONS: 10 mL 1% lidocaine COMPLICATIONS: None immediate. PROCEDURE: Informed written consent was obtained from the patient after a discussion of the risks, benefits and alternatives to treatment. A timeout was performed prior to the initiation of the procedure. Initial ultrasound scanning demonstrates a large amount of ascites within the right lateral abdomen. The right lateral abdomen was prepped and draped in the usual sterile fashion. 1% lidocaine was used for local anesthesia. Following this, a 19 gauge, 7-cm, Yueh catheter was introduced. An ultrasound image was saved for documentation purposes. The paracentesis was performed. The catheter was removed and a dressing was applied. The patient tolerated the procedure well without immediate post procedural complication. FINDINGS: A total of approximately 6.0 liters of chylous fluid was removed. IMPRESSION: Successful ultrasound-guided therapeutic paracentesis yielding 6.0 liters of peritoneal fluid. Read by:  Brynda Greathouse PA-C Electronically Signed   By: Sandi Mariscal M.D.   On: 08/09/2016 14:27      Subjective: - no chest pain, shortness of breath, no abdominal pain, nausea or vomiting.   Discharge Exam: Vitals:   08/24/16 2011 08/25/16 0412  BP: (!) 93/35 (!) 95/44  Pulse: 92 80  Resp: 18 18  Temp: 98.3 F (36.8 C) 98.5 F (36.9 C)   Vitals:   08/24/16 1542 08/24/16 2011 08/25/16 0230 08/25/16 0412  BP: (!) 92/54 (!) 93/35  (!) 95/44  Pulse: 63 92  80  Resp:  18  18  Temp:  98.3 F (36.8 C)  98.5 F (36.9 C)  TempSrc:  Oral  Oral  SpO2:  95%  97%  Weight:   81.8 kg (180 lb 4.8 oz)   Height:        General: Pt is alert, awake, not in acute distress Cardiovascular: RRR, S1/S2 +, no rubs, no gallops Respiratory: CTA bilaterally, no wheezing, no rhonchi Abdominal: Soft, NT, mildly distended, bowel sounds + Extremities: no edema, no  cyanosis    The results of significant diagnostics from this hospitalization (including imaging, microbiology, ancillary and laboratory) are listed below for reference.     Microbiology: No results found for this or any previous visit (from the past 240 hour(s)).   Labs: BNP (last 3 results)  Recent Labs  04/26/16 1308 05/10/16 1431 06/25/16 1658  BNP 96.5 114.7* 161.0*   Basic Metabolic Panel:  Recent Labs Lab 08/23/16 1729 08/24/16 0617 08/25/16 0428  NA 132* 131* 133*  K 5.3* 4.9 4.8  CL 100* 102 104  CO2 19* 20* 19*  GLUCOSE 99 54* 118*  BUN 35* 37* 31*  CREATININE 2.27* 2.21* 1.95*  CALCIUM 9.6 8.9 8.7*   Liver Function Tests:  Recent Labs Lab 08/23/16 1729  AST 27  ALT 15  ALKPHOS 46  BILITOT 1.5*  PROT 7.3  ALBUMIN 3.5  No results for input(s): LIPASE, AMYLASE in the last 168 hours. No results for input(s): AMMONIA in the last 168 hours. CBC:  Recent Labs Lab 08/23/16 1729 08/25/16 0428  WBC 8.4 6.8  HGB 13.8 12.1  HCT 41.0 36.6  MCV 94.0 94.1  PLT 220 188   Cardiac Enzymes: No results for input(s): CKTOTAL, CKMB, CKMBINDEX, TROPONINI in the last 168 hours. BNP: Invalid input(s): POCBNP CBG:  Recent Labs Lab 08/24/16 0930 08/24/16 1247 08/24/16 1532 08/24/16 2010 08/25/16 0745  GLUCAP 217* 188* 186* 178* 137*   D-Dimer No results for input(s): DDIMER in the last 72 hours. Hgb A1c No results for input(s): HGBA1C in the last 72 hours. Lipid Profile No results for input(s): CHOL, HDL, LDLCALC, TRIG, CHOLHDL, LDLDIRECT in the last 72 hours. Thyroid function studies No results for input(s): TSH, T4TOTAL, T3FREE, THYROIDAB in the last 72 hours.  Invalid input(s): FREET3 Anemia work up No results for input(s): VITAMINB12, FOLATE, FERRITIN, TIBC, IRON, RETICCTPCT in the last 72 hours. Urinalysis    Component Value Date/Time   COLORURINE YELLOW 06/26/2016 [place] 06/26/2016 0353   LABSPEC 1.006 06/26/2016  0353   PHURINE 5.0 06/26/2016 0353   GLUCOSEU NEGATIVE 06/26/2016 0353   HGBUR NEGATIVE 06/26/2016 0353   BILIRUBINUR NEGATIVE 06/26/2016 0353   KETONESUR NEGATIVE 06/26/2016 0353   PROTEINUR NEGATIVE 06/26/2016 0353   NITRITE POSITIVE (A) 06/26/2016 0353   LEUKOCYTESUR SMALL (A) 06/26/2016 0353   Sepsis Labs Invalid input(s): PROCALCITONIN,  WBC,  LACTICIDVEN Microbiology No results found for this or any previous visit (from the past 240 hour(s)).   Time coordinating discharge: 25 minutes  SIGNED:  Marzetta Board, MD  [facility] 08/25/2016, 1:48 PM Pager [phone]  If 7PM-7AM, please contact night-coverage [email or web address] Password [email or web address]

## 2016-08-27 DIAGNOSIS — K7460 Unspecified cirrhosis of liver: Secondary | ICD-10-CM | POA: Diagnosis not present

## 2016-08-31 DIAGNOSIS — R110 Nausea: Secondary | ICD-10-CM | POA: Diagnosis not present

## 2016-08-31 DIAGNOSIS — R944 Abnormal results of kidney function studies: Secondary | ICD-10-CM | POA: Diagnosis not present

## 2016-08-31 DIAGNOSIS — K7460 Unspecified cirrhosis of liver: Secondary | ICD-10-CM | POA: Diagnosis not present

## 2016-09-02 ENCOUNTER — Inpatient Hospital Stay (HOSPITAL_COMMUNITY)
Admission: EM | Admit: 2016-09-02 | Discharge: 2016-09-05 | DRG: 433 | Disposition: A | Discharge status: Home / Self Care | Payer: Medicare Other | Attending: Family Medicine | Admitting: Family Medicine

## 2016-09-02 ENCOUNTER — Emergency Department (HOSPITAL_COMMUNITY): Payer: Medicare Other

## 2016-09-02 ENCOUNTER — Encounter (HOSPITAL_COMMUNITY): Payer: Self-pay | Admitting: Pharmacy Technician

## 2016-09-02 DIAGNOSIS — I8500 Esophageal varices without bleeding: Secondary | ICD-10-CM | POA: Diagnosis not present

## 2016-09-02 DIAGNOSIS — R188 Other ascites: Secondary | ICD-10-CM | POA: Diagnosis not present

## 2016-09-02 DIAGNOSIS — N179 Acute kidney failure, unspecified: Secondary | ICD-10-CM | POA: Diagnosis not present

## 2016-09-02 DIAGNOSIS — R9431 Abnormal electrocardiogram [ECG] [EKG]: Secondary | ICD-10-CM | POA: Diagnosis not present

## 2016-09-02 DIAGNOSIS — K219 Gastro-esophageal reflux disease without esophagitis: Secondary | ICD-10-CM | POA: Diagnosis present

## 2016-09-02 DIAGNOSIS — I483 Typical atrial flutter: Secondary | ICD-10-CM | POA: Diagnosis not present

## 2016-09-02 DIAGNOSIS — Z794 Long term (current) use of insulin: Secondary | ICD-10-CM

## 2016-09-02 DIAGNOSIS — Z791 Long term (current) use of non-steroidal anti-inflammatories (NSAID): Secondary | ICD-10-CM

## 2016-09-02 DIAGNOSIS — Z882 Allergy status to sulfonamides status: Secondary | ICD-10-CM

## 2016-09-02 DIAGNOSIS — E1165 Type 2 diabetes mellitus with hyperglycemia: Secondary | ICD-10-CM | POA: Diagnosis present

## 2016-09-02 DIAGNOSIS — E1122 Type 2 diabetes mellitus with diabetic chronic kidney disease: Secondary | ICD-10-CM | POA: Diagnosis present

## 2016-09-02 DIAGNOSIS — K7031 Alcoholic cirrhosis of liver with ascites: Secondary | ICD-10-CM | POA: Diagnosis not present

## 2016-09-02 DIAGNOSIS — E1142 Type 2 diabetes mellitus with diabetic polyneuropathy: Secondary | ICD-10-CM | POA: Diagnosis not present

## 2016-09-02 DIAGNOSIS — R180 Malignant ascites: Secondary | ICD-10-CM

## 2016-09-02 DIAGNOSIS — N183 Chronic kidney disease, stage 3 (moderate): Secondary | ICD-10-CM | POA: Diagnosis present

## 2016-09-02 DIAGNOSIS — E875 Hyperkalemia: Secondary | ICD-10-CM | POA: Diagnosis not present

## 2016-09-02 DIAGNOSIS — I5032 Chronic diastolic (congestive) heart failure: Secondary | ICD-10-CM | POA: Diagnosis present

## 2016-09-02 DIAGNOSIS — K7469 Other cirrhosis of liver: Secondary | ICD-10-CM | POA: Diagnosis not present

## 2016-09-02 DIAGNOSIS — Z888 Allergy status to other drugs, medicaments and biological substances status: Secondary | ICD-10-CM

## 2016-09-02 DIAGNOSIS — F17210 Nicotine dependence, cigarettes, uncomplicated: Secondary | ICD-10-CM | POA: Diagnosis present

## 2016-09-02 DIAGNOSIS — Z881 Allergy status to other antibiotic agents status: Secondary | ICD-10-CM

## 2016-09-02 DIAGNOSIS — I4892 Unspecified atrial flutter: Secondary | ICD-10-CM | POA: Diagnosis present

## 2016-09-02 DIAGNOSIS — R0602 Shortness of breath: Secondary | ICD-10-CM

## 2016-09-02 DIAGNOSIS — K7460 Unspecified cirrhosis of liver: Secondary | ICD-10-CM | POA: Diagnosis not present

## 2016-09-02 DIAGNOSIS — I959 Hypotension, unspecified: Secondary | ICD-10-CM | POA: Diagnosis present

## 2016-09-02 DIAGNOSIS — Z79899 Other long term (current) drug therapy: Secondary | ICD-10-CM

## 2016-09-02 DIAGNOSIS — Z9114 Patient's other noncompliance with medication regimen: Secondary | ICD-10-CM

## 2016-09-02 DIAGNOSIS — Z880 Allergy status to penicillin: Secondary | ICD-10-CM

## 2016-09-02 LAB — CBC WITH DIFFERENTIAL/PLATELET
Basophils Absolute: 0 10*3/uL (ref 0.0–0.1)
Basophils Relative: 0 %
Eosinophils Absolute: 0.1 10*3/uL (ref 0.0–0.7)
Eosinophils Relative: 2 %
HEMATOCRIT: 37.9 % (ref 36.0–46.0)
HEMOGLOBIN: 12.9 g/dL (ref 12.0–15.0)
LYMPHS ABS: 1.2 10*3/uL (ref 0.7–4.0)
Lymphocytes Relative: 15 %
MCH: 32.4 pg (ref 26.0–34.0)
MCHC: 34 g/dL (ref 30.0–36.0)
MCV: 95.2 fL (ref 78.0–100.0)
MONO ABS: 0.9 10*3/uL (ref 0.1–1.0)
MONOS PCT: 11 %
NEUTROS ABS: 5.7 10*3/uL (ref 1.7–7.7)
NEUTROS PCT: 71 %
Platelets: 224 10*3/uL (ref 150–400)
RBC: 3.98 MIL/uL (ref 3.87–5.11)
RDW: 16.9 % — AB (ref 11.5–15.5)
WBC: 8 10*3/uL (ref 4.0–10.5)

## 2016-09-02 LAB — COMPREHENSIVE METABOLIC PANEL
ALK PHOS: 43 U/L (ref 38–126)
ALT: 19 U/L (ref 14–54)
AST: 30 U/L (ref 15–41)
Albumin: 3.3 g/dL — ABNORMAL LOW (ref 3.5–5.0)
Anion gap: 9 (ref 5–15)
BILIRUBIN TOTAL: 0.8 mg/dL (ref 0.3–1.2)
BUN: 57 mg/dL — AB (ref 6–20)
CHLORIDE: 106 mmol/L (ref 101–111)
CO2: 19 mmol/L — AB (ref 22–32)
CREATININE: 2.48 mg/dL — AB (ref 0.44–1.00)
Calcium: 9.3 mg/dL (ref 8.9–10.3)
GFR calc Af Amer: 22 mL/min — ABNORMAL LOW (ref >60)
GFR calc non Af Amer: 19 mL/min — ABNORMAL LOW (ref >60)
Glucose, Bld: 130 mg/dL — ABNORMAL HIGH (ref 65–99)
POTASSIUM: 5.5 mmol/L — AB (ref 3.5–5.1)
Sodium: 134 mmol/L — ABNORMAL LOW (ref 135–145)
Total Protein: 6.4 g/dL — ABNORMAL LOW (ref 6.5–8.1)

## 2016-09-02 LAB — PROTIME-INR
INR: 1.28
Prothrombin Time: 16.1 seconds — ABNORMAL HIGH (ref 11.4–15.2)

## 2016-09-02 LAB — CBG MONITORING, ED: Glucose-Capillary: 75 mg/dL (ref 65–99)

## 2016-09-02 LAB — I-STAT TROPONIN, ED: Troponin i, poc: 0.01 ng/mL (ref 0.00–0.08)

## 2016-09-02 MED ORDER — SODIUM CHLORIDE 0.9% FLUSH
3.0000 mL | Freq: Two times a day (BID) | INTRAVENOUS | Status: DC
  Administered 2016-09-03 – 2016-09-04 (×4): 3 mL via INTRAVENOUS

## 2016-09-02 MED ORDER — FAMOTIDINE 20 MG PO TABS
20.0000 mg | ORAL_TABLET | Freq: Every day | ORAL | Status: DC
  Administered 2016-09-03 – 2016-09-05 (×3): 20 mg via ORAL
  Filled 2016-09-02 (×3): qty 1

## 2016-09-02 MED ORDER — SODIUM CHLORIDE 0.9 % IV SOLN: 250.0000 mL | INTRAVENOUS | Status: DC | PRN

## 2016-09-02 MED ORDER — OXYCODONE HCL 5 MG PO TABS
5.0000 mg | ORAL_TABLET | ORAL | Status: DC | PRN
  Administered 2016-09-03: 5 mg via ORAL
  Filled 2016-09-02 (×2): qty 1

## 2016-09-02 MED ORDER — ACETAMINOPHEN 325 MG PO TABS: 650.0000 mg | ORAL_TABLET | Freq: Four times a day (QID) | ORAL | Status: DC | PRN

## 2016-09-02 MED ORDER — ACETAMINOPHEN 650 MG RE SUPP: 650.0000 mg | Freq: Four times a day (QID) | RECTAL | Status: DC | PRN

## 2016-09-02 MED ORDER — SODIUM CHLORIDE 0.9% FLUSH: 3.0000 mL | INTRAVENOUS | Status: DC | PRN

## 2016-09-02 MED ORDER — ALBUMIN HUMAN 25 % IV SOLN
50.0000 g | Freq: Once | INTRAVENOUS | Status: AC | PRN
  Administered 2016-09-03: 50 g via INTRAVENOUS
  Filled 2016-09-02: qty 200

## 2016-09-02 MED ORDER — AMIODARONE HCL 200 MG PO TABS
200.0000 mg | ORAL_TABLET | Freq: Every day | ORAL | Status: DC
  Administered 2016-09-03 – 2016-09-05 (×3): 200 mg via ORAL
  Filled 2016-09-02 (×4): qty 1

## 2016-09-02 MED ORDER — SODIUM CHLORIDE 0.9% FLUSH
3.0000 mL | Freq: Two times a day (BID) | INTRAVENOUS | Status: DC
  Administered 2016-09-02: 3 mL via INTRAVENOUS

## 2016-09-02 MED ORDER — ONDANSETRON HCL 4 MG PO TABS: 4.0000 mg | ORAL_TABLET | Freq: Four times a day (QID) | ORAL | Status: DC | PRN

## 2016-09-02 MED ORDER — LACTULOSE 10 GM/15ML PO SOLN
10.0000 g | Freq: Two times a day (BID) | ORAL | Status: DC | PRN
  Administered 2016-09-03 – 2016-09-04 (×2): 10 g via ORAL
  Filled 2016-09-02 (×2): qty 15

## 2016-09-02 MED ORDER — INSULIN DETEMIR 100 UNIT/ML ~~LOC~~ SOLN
10.0000 [IU] | Freq: Two times a day (BID) | SUBCUTANEOUS | Status: DC
  Administered 2016-09-03: 10 [IU] via SUBCUTANEOUS
  Filled 2016-09-02 (×2): qty 0.1

## 2016-09-02 MED ORDER — ONDANSETRON HCL 4 MG/2ML IJ SOLN: 4.0000 mg | Freq: Four times a day (QID) | INTRAMUSCULAR | Status: DC | PRN

## 2016-09-02 MED ORDER — DIPHENHYDRAMINE HCL 25 MG PO CAPS: 25.0000 mg | ORAL_CAPSULE | Freq: Four times a day (QID) | ORAL | Status: DC | PRN

## 2016-09-02 MED ORDER — INSULIN ASPART 100 UNIT/ML ~~LOC~~ SOLN
0.0000 [IU] | SUBCUTANEOUS | Status: DC
  Administered 2016-09-03: 2 [IU] via SUBCUTANEOUS
  Administered 2016-09-03: 3 [IU] via SUBCUTANEOUS
  Administered 2016-09-03: 5 [IU] via SUBCUTANEOUS
  Administered 2016-09-04: 3 [IU] via SUBCUTANEOUS

## 2016-09-02 NOTE — ED Notes (Signed)
Patient transported to X-ray 

## 2016-09-02 NOTE — ED Provider Notes (Signed)
[facility] DEPT Provider Note   CSN: [id number] Arrival date & time: 09/02/16  1659     History   Chief Complaint Chief Complaint  Patient presents with  . Ascites    HPI Natalie Lane is a 71 y.o. female.  HPI Patient, with past medical history of diastolic CHF, cirrhosis, presents to ED for shortness of breath and worsening ascites for the past 2 days. States that the ascites of develop in the past few days which is faster than usual. She states that she has had to have fluid drained multiple times in the past. She was recently hospitalized 10 days ago and was discharged approximately 1 week ago for worsening in her shortness of breath and ascites. She also reports left-sided abdominal pain and nausea.  Patient states that she used to receive outpatient drainage of fluid in the past but because her heart began racing during this procedure she was unable to schedule  further. She was told by her GI specialist that she would have to come in for admission.  Past Medical History:  Diagnosis Date  . Anemia   . Ascites   . CHD (congenital heart disease)    a. 1979 s/p septal defect repair (? VSD vs ASD), old op notes not available;  b. 04/2016 Echo: EF 60-65%, no rwma, Gr2 DD, mild AS, mild TR, no PFO (prev repaired).  . Chronic diastolic CHF (congestive heart failure) ([place])    04/2016 Echo: EF 60-65%, no rwma, Gr2 DD.  Marland Kitchen Complication of anesthesia   . 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 04/2016 s/p paracentesis.  . Diabetes mellitus, type 2 ([place])   . Dyspnea   . Edema   . Gastric varices    a. s/p banding.  Marland Kitchen GERD (gastroesophageal reflux disease)   . History of blood transfusion   . Hyperlipidemia   . Normal stress echocardiogram 2008  . Pancreatic adenoma   . Paroxysmal atrial flutter ([place])    a. 11/2009 s/p RFCA.  Marland Kitchen PONV (postoperative nausea and vomiting)    with Cholecystectomy  . Tobacco abuse    a. smokes 4 cigarettes/day.    Patient Active Problem List   Diagnosis Date Noted  . Pressure injury of skin 08/24/2016  . ARF (acute renal failure) ([place]) 06/26/2016  . Tobacco use 06/26/2016  . Decompensated liver disease ([place]) 06/26/2016  . AKI (acute kidney injury) ([place])   . PAT (paroxysmal atrial tachycardia) ([place]) 05/12/2016  . Ascites 05/10/2016  . 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 Esophageal varices ([place]) 01/23/2014  . Cirrhosis ([place]) 11/16/2013  . Chest pain at rest 09/23/2011  . Heart palpitations 12/03/2010  . Atrial flutter ([place])   . CHD (congenital heart disease)   . Edema   . Hyperlipidemia   . Poorly controlled type 2 diabetes mellitus with peripheral neuropathy [facility])     Past Surgical History:  Procedure Laterality Date  . ABDOMINAL HYSTERECTOMY    . ASD versus VSD repair  1979   Operative notes not available.  . ATRIAL ABLATION SURGERY  10 /2011  . [place]   x2  . CHOLECYSTECTOMY    . ESOPHAGOGASTRODUODENOSCOPY (EGD) WITH PROPOFOL N/A 11/16/2013   Procedure: ESOPHAGOGASTRODUODENOSCOPY (EGD) WITH PROPOFOL;  Surgeon: Lear Ng, MD;  Location: [facility];  Service: Endoscopy;  Laterality: N/A;  . ESOPHAGOGASTRODUODENOSCOPY (EGD) WITH PROPOFOL N/A 01/23/2014   Procedure: ESOPHAGOGASTRODUODENOSCOPY (EGD) WITH PROPOFOL;  Surgeon: Lear Ng, MD;  Location: [facility];  Service: Endoscopy;  Laterality: N/A;  . GASTRIC VARICES BANDING N/A 11/16/2013   Procedure: GASTRIC VARICES BANDING;  Surgeon: Lear Ng, MD;  Location: [facility];  Service: Endoscopy;  Laterality: N/A;  . GASTRIC VARICES BANDING N/A 01/23/2014   Procedure: GASTRIC VARICES BANDING;  Surgeon: Lear Ng, MD;  Location: [facility];  Service: Endoscopy;  Laterality: N/A;  . HERNIA REPAIR    . IR GENERIC HISTORICAL  05/11/2016   IR PARACENTESIS 05/11/2016 Ascencion Dike, PA-C [facility]-INTERV RAD  . IR PARACENTESIS  07/08/2016  . IR PARACENTESIS  08/09/2016  . IR PARACENTESIS  08/24/2016  . OTHER  SURGICAL HISTORY     partial hysterectomy    OB History    No data available       Home Medications    Prior to Admission medications   Medication Sig Start Date End Date Taking? Authorizing Provider  amiodarone (PACERONE) 200 MG tablet Take 1 tablet (200 mg total) by mouth daily. 07/16/16   Martinique, Peter M, MD  diphenhydrAMINE (BENADRYL) 25 MG tablet Take 25 mg by mouth every 6 (six) hours as needed for allergies.    [provider]  glimepiride (AMARYL) 2 MG tablet Take 1 tablet (2 mg total) by mouth 2 (two) times daily before a meal. Patient taking differently: Take 1 mg by mouth 2 (two) times daily.  02/26/16   Philemon Kingdom, MD  insulin NPH Human (NOVOLIN N) 100 UNIT/ML injection Inject 20 units twice daily before meals. Patient taking differently: Inject 15-25 Units into the skin See admin instructions. Inject 25 units into the skin in the morning and 15 units into the skin at night 08/10/16   Elayne Snare, MD  lactulose [facility]) 10 GM/15ML solution Take 10 g by mouth 2 (two) times daily as needed for mild constipation.     [provider]  metFORMIN (GLUCOPHAGE-XR) 500 MG 24 hr tablet Take 1 tablet (500 mg total) by mouth 2 (two) times daily with a meal. 06/25/16   Philemon Kingdom, MD  metoprolol tartrate (LOPRESSOR) 25 MG tablet Take 0.5 tablets (12.5 mg total) by mouth 2 (two) times daily. 06/30/16   Oswald Hillock, MD  ranitidine (ZANTAC) 150 MG capsule Take 150 mg by mouth daily as needed for heartburn.    [provider]    Family History Family History  Problem Relation Age of Onset  . Hypertension Mother   . Kidney disease Sister   . Depression Paternal Grandfather   . Diabetes Brother     Social History Social History  Substance Use Topics  . Smoking status: Current Every Day Smoker    Packs/day: 0.50    Years: 36.00    Types: Cigarettes  . Smokeless tobacco: Never Used     Comment: was smoking 4 cigs/day prior to admission.  .  Alcohol use No     Allergies   Monosodium glutamate; Red dye; Erythromycin; Penicillins; Statins; Sulfa antibiotics; Glipizide; Iohexol; Spironolactone; and Tape   Review of Systems Review of Systems  Constitutional: Negative for appetite change, chills and fever.  HENT: Negative for ear pain, rhinorrhea, sneezing and sore throat.   Eyes: Negative for photophobia and visual disturbance.  Respiratory: Positive for shortness of breath. Negative for cough, chest tightness and wheezing.   Cardiovascular: Negative for chest pain and palpitations.  Gastrointestinal: Positive for abdominal distention. Negative for abdominal pain, blood in stool, constipation, diarrhea, nausea and vomiting.  Genitourinary: Negative for dysuria, hematuria and urgency.  Musculoskeletal: Negative for myalgias.  Skin: Negative  for rash.  Neurological: Negative for dizziness, weakness and light-headedness.     Physical Exam Updated Vital Signs BP (!) 94/59   Pulse 85   Temp 98 F (36.7 C) (Oral)   Resp 19   SpO2 97%   Physical Exam  Constitutional: She appears well-developed and well-nourished. No distress.  HENT:  Head: Normocephalic and atraumatic.  Nose: Nose normal.  Eyes: Conjunctivae and EOM are normal. Left eye exhibits no discharge. No scleral icterus.  Neck: Normal range of motion. Neck supple.  Cardiovascular: Normal rate, regular rhythm, normal heart sounds and intact distal pulses.  Exam reveals no gallop and no friction rub.   No murmur heard. Pulmonary/Chest: Effort normal and breath sounds normal. No respiratory distress.  Abdominal: Soft. Bowel sounds are normal. She exhibits distension. There is no tenderness. There is no rebound and no guarding.    Tenderness to palpation in the indicated area. Abdomen is distended from ascites. No rebound or guarding present.  Musculoskeletal: Normal range of motion. She exhibits no edema.  Neurological: She is alert. She exhibits normal muscle  tone. Coordination normal.  Skin: Skin is warm and dry. No rash noted.  Psychiatric: She has a normal mood and affect.  Nursing note and vitals reviewed.    ED Treatments / Results  Labs (all labs ordered are listed, but only abnormal results are displayed) Labs Reviewed  CBC WITH DIFFERENTIAL/PLATELET - Abnormal; Notable for the following:       Result Value   RDW 16.9 (*)    All other components within normal limits  COMPREHENSIVE METABOLIC PANEL - Abnormal; Notable for the following:    Sodium 134 (*)    Potassium 5.5 (*)    CO2 19 (*)    Glucose, Bld 130 (*)    BUN 57 (*)    Creatinine, Ser 2.48 (*)    Total Protein 6.4 (*)    Albumin 3.3 (*)    GFR calc non Af Amer 19 (*)    GFR calc Af Amer 22 (*)    All other components within normal limits  PROTIME-INR  I-STAT TROPONIN, ED    EKG  EKG Interpretation  Date/Time:  Thursday September 02 2016 17:42:50 EDT Ventricular Rate:  85 PR Interval:    QRS Duration: 102 QT Interval:  382 QTC Calculation: 455 R Axis:   78 Text Interpretation:  Sinus rhythm Low voltage, precordial leads Abnormal R-wave progression, early transition Abnormal T, consider ischemia, anterior leads anterior T waves similar to August 23 2016 Confirmed by Sherwood Gambler [phone]) on 09/02/2016 5:58:57 PM       Radiology Dg Chest 2 View  Result Date: 09/02/2016 CLINICAL DATA:  Shortness of breath. EXAM: CHEST  2 VIEW COMPARISON:  08/23/2016 FINDINGS: Low lung volumes. Bibasilar atelectasis/scarring again noted. Cardiopericardial silhouette is at upper limits of normal for size. The visualized bony structures of the thorax are intact. Telemetry leads overlie the chest. IMPRESSION: Stable exam.  Basilar chronic atelectasis or scarring. Electronically Signed   By: Misty Stanley M.D.   On: 09/02/2016 18:32    Procedures Procedures (including critical care time)  Medications Ordered in ED Medications - No data to display   Initial Impression / Assessment  and Plan / ED Course  I have reviewed the triage vital signs and the nursing notes.  Pertinent labs & imaging results that were available during my care of the patient were reviewed by me and considered in my medical decision making (see chart for details).  Patient presents to ED for evaluation of ascites and shortness of breath. She was recently discharged from hospital 8 days ago for similar complaints. She states that she has had to have her ascites drained several times in the past while being admitted. She is afebrile with no history of fever. She reports left-sided abdominal pain she states has occurred in the past when she has the ascites. On physical exam there is abdominal distention present. Oxygen saturation is 97%. Chest x-ray negative. EKG showed findings similar to previous tracings. Blood pressure 90s/50s here in the ED. I have low suspicion for SBP being the cause of her ascites based on her labs and temperature today. Troponin negative 1. CBC unremarkable. CMP showed elevation in creatinine and BUN at 2.48 and 57. Appears that patient has similar findings when she presents in the ED and has to be admitted due to AKI, with improvement in her kidney function at discharge. Will admit patient to hospitalist team for further evaluation, management of AKI and possible drainage of fluid in the AM. Spoke to hospitalist team who will admit patient. Appreciate their help with this patient.  Patient discussed with and seen by Dr. Regenia Skeeter.  Final Clinical Impressions(s) / ED Diagnoses   Final diagnoses:  None    New Prescriptions New Prescriptions   No medications on file     Delia Heady, Hershal Coria 09/02/16 2004    Sherwood Gambler, MD 09/02/16 [id number]

## 2016-09-02 NOTE — ED Triage Notes (Signed)
Pt arrives to the ED via EMS with reports of ascites. Pt with hx of cirrhosis. Seen here 3 weeks ago and had 6L drained. Pt developed sob 2 days ago. VSS with EMS.

## 2016-09-02 NOTE — H&P (Signed)
History and Physical    Natalie Lane KYH:[id number] DOB: 09-19-1945 DOA: 09/02/2016  PCP: Carol Ada, MD   Patient coming from: Home  Chief Complaint: Marked abd distension with SOB  HPI: Natalie Lane is a 71 y.o. female with medical history significant for cryptogenic cirrhosis with ascites, paroxysmal atrial flutter, and insulin-dependent diabetes mellitus, now presenting to the emergency department for evaluation of progressive abdominal distention, now causing shortness of breath and pain. Patient had 6 L of ascitic fluid removed by paracentesis on 08/24/2016 and reports doing fairly well for several days after that before her abdomen began to distend again. She has required repeat paracenteses, but reports that fluid has seemed to accumulate more quickly this time. Reports distention increasing over the past several days, now to the point where there is some generalized abdominal pain and dyspnea that she attributes to the marked distention. She denies any fevers or chills, denies vomiting or diarrhea, denies melena or hematochezia, and denies any history of SBP. She denies any cough, chest pain, or palpitations. She did not attempt any interventions for her symptoms prior to coming in.   ED Course: Upon arrival to the ED, patient is found to be afebrile, saturating well on room air, mildly tachypneic, with blood pressure on the lower end, but vitals otherwise stable. EKG features a sinus rhythm with early R transition and low voltage QRS. Chest x-ray has a stable exam with basilar chronic atelectasis versus scarring. Chemistry panels notable for a potassium of 5.5, bicarbonate of 19, BUN of 57, and creatinine of 2.48, up from 1.95 a week earlier. CBC is unremarkable, INR is 1.28, and troponin is within normal limits. ED physician did not feel comfortable performing a paracentesis and hospitalists at been asked to admit for ongoing evaluation and management of cirrhosis with ascites  causing distention with pain and dyspnea.  Review of Systems:  All other systems reviewed and apart from HPI, are negative.  Past Medical History:  Diagnosis Date  . Anemia   . Ascites   . CHD (congenital heart disease)    a. 1979 s/p septal defect repair (? VSD vs ASD), old op notes not available;  b. 04/2016 Echo: EF 60-65%, no rwma, Gr2 DD, mild AS, mild TR, no PFO (prev repaired).  . Chronic diastolic CHF (congestive heart failure) ([place])    04/2016 Echo: EF 60-65%, no rwma, Gr2 DD.  Marland Kitchen Complication of anesthesia   . 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 04/2016 s/p paracentesis.  . Diabetes mellitus, type 2 ([place])   . Dyspnea   . Edema   . Gastric varices    a. s/p banding.  Marland Kitchen GERD (gastroesophageal reflux disease)   . History of blood transfusion   . Hyperlipidemia   . Normal stress echocardiogram 2008  . Pancreatic adenoma   . Paroxysmal atrial flutter ([place])    a. 11/2009 s/p RFCA.  Marland Kitchen PONV (postoperative nausea and vomiting)    with Cholecystectomy  . Tobacco abuse    a. smokes 4 cigarettes/day.    Past Surgical History:  Procedure Laterality Date  . ABDOMINAL HYSTERECTOMY    . ASD versus VSD repair  1979   Operative notes not available.  . ATRIAL ABLATION SURGERY  10 /2011  . [place]   x2  . CHOLECYSTECTOMY    . ESOPHAGOGASTRODUODENOSCOPY (EGD) WITH PROPOFOL N/A 11/16/2013   Procedure: ESOPHAGOGASTRODUODENOSCOPY (EGD) WITH PROPOFOL;  Surgeon: Lear Ng, MD;  Location: [place];  Service: Endoscopy;  Laterality: N/A;  . ESOPHAGOGASTRODUODENOSCOPY (EGD) WITH PROPOFOL N/A 01/23/2014   Procedure: ESOPHAGOGASTRODUODENOSCOPY (EGD) WITH PROPOFOL;  Surgeon: Lear Ng, MD;  Location: [place];  Service: Endoscopy;  Laterality: N/A;  . GASTRIC VARICES BANDING N/A 11/16/2013   Procedure: GASTRIC VARICES BANDING;  Surgeon: Lear Ng, MD;  Location: [place];  Service: Endoscopy;  Laterality: N/A;  . GASTRIC VARICES BANDING N/A  01/23/2014   Procedure: GASTRIC VARICES BANDING;  Surgeon: Lear Ng, MD;  Location: [place];  Service: Endoscopy;  Laterality: N/A;  . HERNIA REPAIR    . IR GENERIC HISTORICAL  05/11/2016   IR PARACENTESIS 05/11/2016 Ascencion Dike, PA-C [facility]  . IR PARACENTESIS  07/08/2016  . IR PARACENTESIS  08/09/2016  . IR PARACENTESIS  08/24/2016  . OTHER SURGICAL HISTORY     partial hysterectomy     reports that she has been smoking Cigarettes.  She has a 18.00 pack-year smoking history. She has never used smokeless tobacco. She reports that she does not drink alcohol or use drugs.  Allergies  Allergen Reactions  . Monosodium Glutamate Anaphylaxis    MSG  . Red Dye Anaphylaxis  . Erythromycin Other (See Comments)    Reaction:  Migraine   . Penicillins Other (See Comments)    Reaction:  Migraine  Has patient had a PCN reaction causing immediate rash, facial/tongue/throat swelling, SOB or lightheadedness with hypotension: No Has patient had a PCN reaction causing severe rash involving mucus membranes or skin necrosis: No Has patient had a PCN reaction that required hospitalization No Has patient had a PCN reaction occurring within the last 10 years: No If all of the above answers are "NO", then may proceed with Cephalosporin use.  . Statins Other (See Comments)    Reaction:  Severe leg cramps   . Sulfa Antibiotics Hives  . Glipizide Rash  . Iohexol Rash  . Spironolactone Palpitations and Other (See Comments)    Sweating  . Tape Rash    "caused blisters"    Family History  Problem Relation Age of Onset  . Hypertension Mother   . Kidney disease Sister   . Depression Paternal Grandfather   . Diabetes Brother      Prior to Admission medications   Medication Sig Start Date End Date Taking? Authorizing Provider  amiodarone (PACERONE) 200 MG tablet Take 1 tablet (200 mg total) by mouth daily. 07/16/16   Martin[place], Peter M, MD  diphenhydrAMINE (BENADRYL) 25 MG tablet Take 25  mg by mouth every 6 (six) hours as needed for allergies.    [provider]  glimepiride (AMARYL) 2 MG tablet Take 1 tablet (2 mg total) by mouth 2 (two) times daily before a meal. Patient taking differently: Take 1 mg by mouth 2 (two) times daily.  02/26/16   Philemon Kingdom, MD  insulin NPH Human (NOVOLIN N) 100 UNIT/ML injection Inject 20 units twice daily before meals. Patient taking differently: Inject 15-25 Units into the skin See admin instructions. Inject 25 units into the skin in the morning and 15 units into the skin at night 08/10/16   Elayne Snare, MD  lactulose [facility]) 10 GM/15ML solution Take 10 g by mouth 2 (two) times daily as needed for mild constipation.     [provider]  metFORMIN (GLUCOPHAGE-XR) 500 MG 24 hr tablet Take 1 tablet (500 mg total) by mouth 2 (two) times daily with a meal. 06/25/16   Philemon Kingdom, MD  metoprolol tartrate (LOPRESSOR) 25 MG tablet Take 0.5  tablets (12.5 mg total) by mouth 2 (two) times daily. 06/30/16   Oswald Hillock, MD  ranitidine (ZANTAC) 150 MG capsule Take 150 mg by mouth daily as needed for heartburn.    [provider]    Physical Exam: Vitals:   09/02/16 1830 09/02/16 1845 09/02/16 1900 09/02/16 1915  BP: (!) 93/51 (!) 94/59 (!) 97/52 (!) 93/48  Pulse: 82 85 87 85  Resp: 20 19 (!) 24 (!) 29  Temp:      TempSrc:      SpO2: 98% 97% 96% 97%      Constitutional: NAD, calm, appears uncomfortable Eyes: PERTLA, lids and conjunctivae normal ENMT: Mucous membranes are moist. Posterior pharynx clear of any exudate or lesions.   Neck: normal, supple, no masses, no thyromegaly Respiratory: clear to auscultation bilaterally, no wheezing, no crackles. Mild tachypnea. Normal respiratory effort.    Cardiovascular: S1 & S2 heard, regular rate and rhythm. No extremity edema. JVP 8 cmH2O. Abdomen: Marked distension, minimal generalized tenderness, no rebound pain or guarding. Bowel sounds appreciated.    Musculoskeletal: no clubbing / cyanosis. No joint deformity upper and lower extremities.   Skin: Scattered angiomata. Warm, dry, well-perfused. Neurologic: CN 2-12 grossly intact. Sensation intact, DTR normal. Strength 5/5 in all 4 limbs.  Psychiatric: Alert and oriented x 3. Pleasant and cooperative.     Labs on Admission: I have personally reviewed following labs and imaging studies  CBC:  Recent Labs Lab 09/02/16 1740  WBC 8.0  NEUTROABS 5.7  HGB 12.9  HCT 37.9  MCV 95.2  PLT 094   Basic Metabolic Panel:  Recent Labs Lab 09/02/16 1740  NA 134*  K 5.5*  CL 106  CO2 19*  GLUCOSE 130*  BUN 57*  CREATININE 2.48*  CALCIUM 9.3   GFR: Estimated Creatinine Clearance: 21.7 mL/min (A) (by C-G formula based on SCr of 2.48 mg/dL (H)). Liver Function Tests:  Recent Labs Lab 09/02/16 1740  AST 30  ALT 19  ALKPHOS 43  BILITOT 0.8  PROT 6.4*  ALBUMIN 3.3*   No results for input(s): LIPASE, AMYLASE in the last 168 hours. No results for input(s): AMMONIA in the last 168 hours. Coagulation Profile:  Recent Labs Lab 09/02/16 1850  INR 1.28   Cardiac Enzymes: No results for input(s): CKTOTAL, CKMB, CKMBINDEX, TROPONINI in the last 168 hours. BNP (last 3 results) No results for input(s): PROBNP in the last 8760 hours. HbA1C: No results for input(s): HGBA1C in the last 72 hours. CBG: No results for input(s): GLUCAP in the last 168 hours. Lipid Profile: No results for input(s): CHOL, HDL, LDLCALC, TRIG, CHOLHDL, LDLDIRECT in the last 72 hours. Thyroid Function Tests: No results for input(s): TSH, T4TOTAL, FREET4, T3FREE, THYROIDAB in the last 72 hours. Anemia Panel: No results for input(s): VITAMINB12, FOLATE, FERRITIN, TIBC, IRON, RETICCTPCT in the last 72 hours. Urine analysis:    Component Value Date/Time   COLORURINE YELLOW 06/26/2016 [place] 06/26/2016 0353   LABSPEC 1.006 06/26/2016 0353   PHURINE 5.0 06/26/2016 0353   GLUCOSEU  NEGATIVE 06/26/2016 0353   HGBUR NEGATIVE 06/26/2016 0353   BILIRUBINUR NEGATIVE 06/26/2016 0353   KETONESUR NEGATIVE 06/26/2016 0353   PROTEINUR NEGATIVE 06/26/2016 0353   NITRITE POSITIVE (A) 06/26/2016 0353   LEUKOCYTESUR SMALL (A) 06/26/2016 0353   Sepsis Labs: @LABRCNTIP (procalcitonin:4,lacticidven:4) )No results found for this or any previous visit (from the past 240 hour(s)).   Radiological Exams on Admission: Dg Chest 2 View  Result Date: 09/02/2016  CLINICAL DATA:  Shortness of breath. EXAM: CHEST  2 VIEW COMPARISON:  08/23/2016 FINDINGS: Low lung volumes. Bibasilar atelectasis/scarring again noted. Cardiopericardial silhouette is at upper limits of normal for size. The visualized bony structures of the thorax are intact. Telemetry leads overlie the chest. IMPRESSION: Stable exam.  Basilar chronic atelectasis or scarring. Electronically Signed   By: Misty Stanley M.D.   On: 09/02/2016 18:32    EKG: Independently reviewed. Sinus rhythm, early R-transition, low-voltage QRS.   Assessment/Plan  1. Cirrhosis with ascites  - Pt has cryptogenic cirrhosis with ascites, taken off of diuretics recently d/t worsening renal function  - Has known esophageal varices, denies hx of SBP  - Had therapeutic paracentesis with 6 L off on 08/24/16, now presenting with marked distension causing SOB  - No findings to suggest SBP on admission; denies melena or hematochezia, no encephalopathy on admission - IR consulted for therapeutic paracentesis; albumin ordered  - Continue lactulose, propranolol, and H2-blocker as tolerated    2. Acute kidney injury  - SCr is 2.48 on admission, up from 1.95 on 08/25/16, and 1.15 in May '18  - Creatinine was 2.3 during recent admission, attributed by GI to likely diuretic-induced rather than hepatorenal and diuretics discontinued  - Check a renal US, avoid nephrotoxins, renally-dose medications, trend    3. Hyperkalemia  - Serum potassium is 5.5 without EKG  changes  - Likely secondary to #2  - Monitor on telemetry, repeat chem panel    4. Insulin-dependent DM, hypoglycemia  - A1c was 6.7% in April 2018  - Managed at home with metformin, glimepiride, and Novolin N, 25 units qAM and 15 units qPM - Check CBG q4h while NPO for paracentesis  - Initial started on Levemir 10 units BID with moderate-intensity Novolog correctional, but became hypoglycemic in am; Levemir will be held and will continue with sliding-scale Novolog only for now   5. History of atrial flutter - In a sinus rhythm on admission   - CHADS-VASc is 46 (age, gender, DM)   - Followed by cardiology, not anticoagulation candidate given cirrhosis with varices  - Continue amiodarone, propranolol   DVT prophylaxis: SCD's Code Status: Full  Family Communication: Discussed with patient Disposition Plan: Observe on telemetry Consults called: None Admission status: Observation    Vianne Bulls, MD [facility] Pager [phone]  If 7PM-7AM, please contact night-coverage [email or web address] Password [facility]  09/02/2016, 7:52 PM

## 2016-09-03 ENCOUNTER — Observation Stay (HOSPITAL_COMMUNITY): Payer: Medicare Other

## 2016-09-03 ENCOUNTER — Encounter (HOSPITAL_COMMUNITY): Payer: Self-pay | Admitting: Interventional Radiology

## 2016-09-03 DIAGNOSIS — F17210 Nicotine dependence, cigarettes, uncomplicated: Secondary | ICD-10-CM | POA: Diagnosis present

## 2016-09-03 DIAGNOSIS — N183 Chronic kidney disease, stage 3 (moderate): Secondary | ICD-10-CM | POA: Diagnosis present

## 2016-09-03 DIAGNOSIS — E875 Hyperkalemia: Secondary | ICD-10-CM | POA: Diagnosis present

## 2016-09-03 DIAGNOSIS — R188 Other ascites: Secondary | ICD-10-CM | POA: Diagnosis not present

## 2016-09-03 DIAGNOSIS — I5032 Chronic diastolic (congestive) heart failure: Secondary | ICD-10-CM | POA: Diagnosis present

## 2016-09-03 DIAGNOSIS — Z880 Allergy status to penicillin: Secondary | ICD-10-CM | POA: Diagnosis not present

## 2016-09-03 DIAGNOSIS — Z794 Long term (current) use of insulin: Secondary | ICD-10-CM | POA: Diagnosis not present

## 2016-09-03 DIAGNOSIS — K7469 Other cirrhosis of liver: Secondary | ICD-10-CM | POA: Diagnosis present

## 2016-09-03 DIAGNOSIS — Z881 Allergy status to other antibiotic agents status: Secondary | ICD-10-CM | POA: Diagnosis not present

## 2016-09-03 DIAGNOSIS — R0602 Shortness of breath: Secondary | ICD-10-CM

## 2016-09-03 DIAGNOSIS — E1142 Type 2 diabetes mellitus with diabetic polyneuropathy: Secondary | ICD-10-CM | POA: Diagnosis present

## 2016-09-03 DIAGNOSIS — K7460 Unspecified cirrhosis of liver: Secondary | ICD-10-CM | POA: Diagnosis not present

## 2016-09-03 DIAGNOSIS — E1165 Type 2 diabetes mellitus with hyperglycemia: Secondary | ICD-10-CM | POA: Diagnosis present

## 2016-09-03 DIAGNOSIS — E1122 Type 2 diabetes mellitus with diabetic chronic kidney disease: Secondary | ICD-10-CM | POA: Diagnosis present

## 2016-09-03 DIAGNOSIS — Z888 Allergy status to other drugs, medicaments and biological substances status: Secondary | ICD-10-CM | POA: Diagnosis not present

## 2016-09-03 DIAGNOSIS — Z791 Long term (current) use of non-steroidal anti-inflammatories (NSAID): Secondary | ICD-10-CM | POA: Diagnosis not present

## 2016-09-03 DIAGNOSIS — K219 Gastro-esophageal reflux disease without esophagitis: Secondary | ICD-10-CM | POA: Diagnosis present

## 2016-09-03 DIAGNOSIS — Z882 Allergy status to sulfonamides status: Secondary | ICD-10-CM | POA: Diagnosis not present

## 2016-09-03 DIAGNOSIS — Z9114 Patient's other noncompliance with medication regimen: Secondary | ICD-10-CM | POA: Diagnosis not present

## 2016-09-03 DIAGNOSIS — Z79899 Other long term (current) drug therapy: Secondary | ICD-10-CM | POA: Diagnosis not present

## 2016-09-03 DIAGNOSIS — I959 Hypotension, unspecified: Secondary | ICD-10-CM | POA: Diagnosis present

## 2016-09-03 DIAGNOSIS — N179 Acute kidney failure, unspecified: Secondary | ICD-10-CM | POA: Diagnosis present

## 2016-09-03 DIAGNOSIS — I8500 Esophageal varices without bleeding: Secondary | ICD-10-CM | POA: Diagnosis present

## 2016-09-03 DIAGNOSIS — I4892 Unspecified atrial flutter: Secondary | ICD-10-CM | POA: Diagnosis present

## 2016-09-03 HISTORY — PX: IR PARACENTESIS: IMG2679

## 2016-09-03 HISTORY — PX: PARACENTESIS: SHX844

## 2016-09-03 LAB — GLUCOSE, CAPILLARY
GLUCOSE-CAPILLARY: 110 mg/dL — AB (ref 65–99)
GLUCOSE-CAPILLARY: 39 mg/dL — AB (ref 65–99)
GLUCOSE-CAPILLARY: 60 mg/dL — AB (ref 65–99)
GLUCOSE-CAPILLARY: 95 mg/dL (ref 65–99)
GLUCOSE-CAPILLARY: 177 mg/dL — AB (ref 65–99)
Glucose-Capillary: 130 mg/dL — ABNORMAL HIGH (ref 65–99)
Glucose-Capillary: 243 mg/dL — ABNORMAL HIGH (ref 65–99)
Glucose-Capillary: 77 mg/dL (ref 65–99)

## 2016-09-03 LAB — GRAM STAIN

## 2016-09-03 LAB — COMPREHENSIVE METABOLIC PANEL
ALBUMIN: 2.8 g/dL — AB (ref 3.5–5.0)
ALK PHOS: 38 U/L (ref 38–126)
ALT: 17 U/L (ref 14–54)
ANION GAP: 8 (ref 5–15)
AST: 22 U/L (ref 15–41)
BUN: 55 mg/dL — ABNORMAL HIGH (ref 6–20)
CHLORIDE: 109 mmol/L (ref 101–111)
CO2: 22 mmol/L (ref 22–32)
Calcium: 9.2 mg/dL (ref 8.9–10.3)
Creatinine, Ser: 2.37 mg/dL — ABNORMAL HIGH (ref 0.44–1.00)
GFR calc non Af Amer: 20 mL/min — ABNORMAL LOW (ref >60)
GFR, EST AFRICAN AMERICAN: 23 mL/min — AB (ref >60)
GLUCOSE: 82 mg/dL (ref 65–99)
POTASSIUM: 5.1 mmol/L (ref 3.5–5.1)
SODIUM: 139 mmol/L (ref 135–145)
Total Bilirubin: 1 mg/dL (ref 0.3–1.2)
Total Protein: 5.9 g/dL — ABNORMAL LOW (ref 6.5–8.1)

## 2016-09-03 LAB — BODY FLUID CELL COUNT WITH DIFFERENTIAL
LYMPHS FL: 39 %
MONOCYTE-MACROPHAGE-SEROUS FLUID: 56 % (ref 50–90)
Neutrophil Count, Fluid: 5 % (ref 0–25)
Total Nucleated Cell Count, Fluid: 134 cu mm (ref 0–1000)

## 2016-09-03 LAB — CBC
HCT: 37.3 % (ref 36.0–46.0)
HEMOGLOBIN: 12.1 g/dL (ref 12.0–15.0)
MCH: 30.6 pg (ref 26.0–34.0)
MCHC: 32.4 g/dL (ref 30.0–36.0)
MCV: 94.4 fL (ref 78.0–100.0)
PLATELETS: 216 10*3/uL (ref 150–400)
RBC: 3.95 MIL/uL (ref 3.87–5.11)
RDW: 15.9 % — AB (ref 11.5–15.5)
WBC: 6.9 10*3/uL (ref 4.0–10.5)

## 2016-09-03 LAB — GLUCOSE, PLEURAL OR PERITONEAL FLUID: Glucose, Fluid: 100 mg/dL

## 2016-09-03 MED ORDER — DEXTROSE-NACL 5-0.45 % IV SOLN
INTRAVENOUS | Status: DC
  Administered 2016-09-03: 06:00:00 via INTRAVENOUS

## 2016-09-03 MED ORDER — DEXTROSE 50 % IV SOLN
INTRAVENOUS | Status: AC
  Administered 2016-09-03: 25 mL
  Filled 2016-09-03: qty 50

## 2016-09-03 MED ORDER — GLUCERNA SHAKE PO LIQD
237.0000 mL | Freq: Two times a day (BID) | ORAL | Status: DC
  Administered 2016-09-04 – 2016-09-05 (×3): 237 mL via ORAL

## 2016-09-03 MED ORDER — SODIUM CHLORIDE 0.9 % IV BOLUS (SEPSIS): 250.0000 mL | Freq: Once | INTRAVENOUS | Status: DC

## 2016-09-03 MED ORDER — PROPRANOLOL HCL 20 MG PO TABS
20.0000 mg | ORAL_TABLET | Freq: Two times a day (BID) | ORAL | Status: DC
  Filled 2016-09-03 (×5): qty 1

## 2016-09-03 MED ORDER — DEXTROSE 50 % IV SOLN
INTRAVENOUS | Status: AC
  Administered 2016-09-03: 07:00:00
  Filled 2016-09-03: qty 50

## 2016-09-03 MED ORDER — SODIUM CHLORIDE 0.9 % IV BOLUS (SEPSIS)
250.0000 mL | Freq: Once | INTRAVENOUS | Status: AC
  Administered 2016-09-03: 250 mL via INTRAVENOUS

## 2016-09-03 MED ORDER — LIDOCAINE HCL (PF) 1 % IJ SOLN
INTRAMUSCULAR | Status: AC
  Filled 2016-09-03: qty 30

## 2016-09-03 NOTE — Progress Notes (Signed)
Initial Nutrition Assessment  DOCUMENTATION CODES:   Obesity unspecified  INTERVENTION:   Glucerna Shake po BID, each supplement provides 220 kcal and 10 grams of protein  NUTRITION DIAGNOSIS:   Inadequate oral intake related to poor appetite as evidenced by per patient/family report.  GOAL:   Patient will meet greater than or equal to 90% of their needs  MONITOR:   PO intake, Supplement acceptance  REASON FOR ASSESSMENT:   Consult Diet education  ASSESSMENT:   Pt with PMH of cirrhosis, DM, gastric varices, HLD, and tobacco abuse. Presents this admission with severe acities, had 9L of fluid removed during paracentesis 7/20. Had prior paracentesis 7/10 where 6L  Was removed.    Pt reports decrease in appetite for 3 months related to pain and feelings of fullness from ascites.  Pt has extensive history of ascites with paracentesis. Pt states she has no prior education regarding nutrition therapy for cirrhosis.  Pt unsure of dry body wt, UBW, or if she's lost any wt uninterntionally. Records show a steady decline in wt since March 2018 from 203 lb to current wt of 184 lbs (9% in 4 months) this wt loss is significant for malnutrition but unsure if this is dry wt loss or decrease in fluid accumulation.  Nutrition-Focused physical exam completed. Findings are no fat depletion, mild muscle depletion in temple regions, and moderate edema in the abdomen and BLE. Edema likely due to cirrhosis symptoms.   Medications reviewed and include: SSI Labs reviewed: Albumin 2.8 (L)  Diet Order:  Diet heart healthy/carb modified Room service appropriate? Yes; Fluid consistency: Thin; Fluid restriction: 1500 mL Fluid  Skin:   (open wound buttocks)  Last BM:  08/31/16  Height:   Ht Readings from Last 1 Encounters:  08/23/16 5\' 3"  (1.6 m)    Weight:   Wt Readings from Last 1 Encounters:  09/03/16 184 lb 11.9 oz (83.8 kg)    Ideal Body Weight:  52.2 kg  BMI:  Body mass index is 32.73  kg/m.  Estimated Nutritional Needs:   Kcal:  1600-1800 (30-34 kcal/kg IBW)  Protein:  80-90 grams (1.5 -1.7 g/kg IBW)  Fluid:  >1.6 L/day  EDUCATION NEEDS:   Education needs addressed  Mariana Single RD, LDN Pager # - [phone]

## 2016-09-03 NOTE — Consult Note (Signed)
Natalie Lane Admit Date: 09/02/2016 09/03/2016 Natalie Lane Requesting Physician:  Dr. Michail Sermon  Reason for Consult:  Diuretic management for recurrent ascites HPI:  Natalie Lane is a 71yo female with PMH of atrial flutter, T2DM, recurrent ampullary adenoma and decompensated cirrhosis who presented to [facility] on 7/19 with recurrent abdominal ascites. Patient has had recurrent ascites, 7 documented in [facility] system since March 2018; no SBP episodes; one ascitic triglyceride level was checked in May which was elevated indicating chylous ascites. Patient has had new renal dysfunction since 5/11 admission for ascites. Patient has been on long term NSAIDs for pain including meloxicam and ibuprofen; her meloxicam was stopped in March but she continued taking ibuprofen. She was supposed to have stopped metformin but this seems to have been continued. Patient was on appropriately prescribed lasix/spironolactone throughout this time period however still had recurrent ascites. In speaking with patient today, she admits confusion about her medications and likely not taking them correctly; she is uncertain when she stopped all NSAIDs; she endorses eating pizza, breaded fish, and potato chips though endorsing low salt diet and fluid restriction. Current admission, patient had 9L ascitic fluid which was again described as chylous.   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  Anemia   . Ascites   . CHD (congenital heart disease)    a. 1979 s/p septal defect repair (? VSD vs ASD), old op notes not available;  b. 04/2016 Echo: EF 60-65%, no rwma, Gr2 DD, mild AS, mild TR, no PFO (prev repaired).  . Chronic diastolic CHF (congestive heart failure) ([place])    04/2016 Echo: EF 60-65%, no rwma, Gr2 DD.  Marland Kitchen Complication of anesthesia   . 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 04/2016 s/p paracentesis.  . Diabetes mellitus, type 2 ([place])   . Dyspnea   . Edema   . Gastric varices    a. s/p banding.  Marland Kitchen GERD (gastroesophageal reflux disease)   . History of blood transfusion   . Hyperlipidemia   . Normal stress echocardiogram 2008  . Pancreatic adenoma   . Paroxysmal atrial flutter ([place])    a. 11/2009 s/p RFCA.  Marland Kitchen PONV (postoperative nausea and vomiting)    with Cholecystectomy  . Tobacco abuse    a. smokes 4 cigarettes/day.   PSH  Past Surgical History:  Procedure Laterality Date  . ABDOMINAL HYSTERECTOMY    . ASD versus VSD repair  1979   Operative notes not available.  . ATRIAL ABLATION SURGERY  10 /2011  . [place]   x2  . CHOLECYSTECTOMY    . ESOPHAGOGASTRODUODENOSCOPY (EGD) WITH PROPOFOL N/A 11/16/2013   Procedure: ESOPHAGOGASTRODUODENOSCOPY (EGD) WITH PROPOFOL;  Surgeon: Lear Ng, MD;  Location: [place];  Service: Endoscopy;  Laterality: N/A;  . ESOPHAGOGASTRODUODENOSCOPY (EGD) WITH PROPOFOL N/A 01/23/2014   Procedure: ESOPHAGOGASTRODUODENOSCOPY (EGD) WITH PROPOFOL;  Surgeon: Lear Ng, MD;  Location: [place];  Service:  Endoscopy;  Laterality: N/A;  . GASTRIC VARICES BANDING N/A 11/16/2013   Procedure: GASTRIC VARICES BANDING;  Surgeon: Lear Ng, MD;  Location: [place];  Service: Endoscopy;  Laterality: N/A;  . GASTRIC VARICES BANDING N/A 01/23/2014   Procedure: GASTRIC VARICES BANDING;  Surgeon: Lear Ng, MD;  Location: [place];  Service: Endoscopy;  Laterality: N/A;  . HERNIA REPAIR    . IR  GENERIC HISTORICAL  05/11/2016   IR PARACENTESIS 05/11/2016 Ascencion Dike, PA-C [facility]-INTERV RAD  . IR PARACENTESIS  07/08/2016  . IR PARACENTESIS  08/09/2016  . IR PARACENTESIS  08/24/2016  . IR PARACENTESIS  09/03/2016  . OTHER SURGICAL HISTORY     partial hysterectomy  . PARACENTESIS  09/03/2016   FH  Family History  Problem Relation Age of Onset  . Hypertension Mother   . Kidney disease Sister   . Depression Paternal Grandfather   . Diabetes Brother    SH  reports that she quit smoking about 6 months ago. Her smoking use included Cigarettes. She has a 18.00 pack-year smoking history. She has never used smokeless tobacco. She reports that she does not drink alcohol or use drugs. Allergies  Allergies  Allergen Reactions  . Monosodium Glutamate Anaphylaxis    MSG  . Red Dye Anaphylaxis  . Erythromycin Other (See Comments)    Reaction:  Migraine   . Penicillins Other (See Comments)    Reaction:  Migraine  Has patient had a PCN reaction causing immediate rash, facial/tongue/throat swelling, SOB or lightheadedness with hypotension: No Has patient had a PCN reaction causing severe rash involving mucus membranes or skin necrosis: No Has patient had a PCN reaction that required hospitalization No Has patient had a PCN reaction occurring within the last 10 years: No If all of the above answers are "NO", then may proceed with Cephalosporin use.  . Statins Other (See Comments)    Reaction:  Severe leg cramps   . Sulfa Antibiotics Hives  . Glipizide Rash  . Iohexol Rash  . Spironolactone Palpitations and Other (See Comments)    Sweating  . Tape Rash    "caused blisters"   Home medications Prior to Admission medications   Medication Sig Start Date End Date Taking? Authorizing Provider  diphenhydrAMINE (BENADRYL) 25 MG tablet Take 25 mg by mouth every 6 (six) hours as needed for allergies.   Yes [provider]  furosemide (LASIX) 40 MG tablet Take 40 mg by mouth daily as needed  for edema. 07/30/16  Yes [provider]  glimepiride (AMARYL) 2 MG tablet Take 1 tablet (2 mg total) by mouth 2 (two) times daily before a meal. Patient taking differently: Take 1 mg by mouth 2 (two) times daily.  02/26/16  Yes Philemon Kingdom, MD  insulin NPH Human (NOVOLIN N) 100 UNIT/ML injection Inject 20 units twice daily before meals. Patient taking differently: Inject 15-25 Units into the skin See admin instructions. Inject 25 units into the skin in the morning and 15 units into the skin at night 08/10/16  Yes Elayne Snare, MD  lactose free nutrition (BOOST) LIQD Take 237 mLs by mouth daily.   Yes [provider]  lactulose (CHRONULAC) 10 GM/15ML solution Take 10 g by mouth 2 (two) times daily as needed for mild constipation.    Yes [provider]  metFORMIN (GLUCOPHAGE-XR) 500 MG 24 hr tablet Take 1 tablet (500 mg total) by mouth 2 (two) times daily with a meal. 06/25/16  Yes Philemon Kingdom, MD  ondansetron (ZOFRAN-ODT) 4 MG disintegrating tablet Take 4 mg by mouth daily as needed for nausea. 08/31/16  Yes [provider]  propranolol (INDERAL) 20 MG tablet Take 20 mg by mouth 2 (two) times daily.   Yes [provider]  ranitidine (ZANTAC) 150 MG capsule Take 150 mg by mouth daily as needed for heartburn.   Yes [provider]  Simethicone (GAS-X PO) Take 1 tablet by mouth daily as needed (gas).   Yes [provider]  spironolactone (ALDACTONE) 100 MG tablet Take 100 mg by mouth daily. 08/13/16  Yes [provider]  amiodarone (PACERONE) 200 MG tablet Take 1 tablet (200 mg total) by mouth daily. Patient not taking: Reported on 09/02/2016 07/16/16   Martinique, Peter M, MD  metoprolol tartrate (LOPRESSOR) 25 MG tablet Take 0.5 tablets (12.5 mg total) by mouth 2 (two) times daily. Patient not taking: Reported on 09/02/2016 06/30/16   Oswald Hillock, MD    Current Medications Scheduled Meds: . amiodarone  200 mg Oral Daily  .  famotidine  20 mg Oral Daily  . insulin aspart  0-15 Units Subcutaneous Q4H  . lidocaine (PF)      . propranolol  20 mg Oral BID  . sodium chloride flush  3 mL Intravenous Q12H   Continuous Infusions: . sodium chloride     PRN Meds:.acetaminophen **OR** acetaminophen, diphenhydrAMINE, lactulose, ondansetron **OR** ondansetron (ZOFRAN) IV, oxyCODONE  CBC  Recent Labs Lab 09/02/16 1740 09/03/16 0434  WBC 8.0 6.9  NEUTROABS 5.7  --   HGB 12.9 12.1  HCT 37.9 37.3  MCV 95.2 94.4  PLT 224 449   Basic Metabolic Panel  Recent Labs Lab 09/02/16 1740 09/03/16 0434  NA 134* 139  K 5.5* 5.1  CL 106 109  CO2 19* 22  GLUCOSE 130* 82  BUN 57* 55*  CREATININE 2.48* 2.37*  CALCIUM 9.3 9.2    Physical Exam  Blood pressure 98/60, pulse 100, temperature 98.4 F (36.9 C), temperature source Oral, resp. rate 17, weight 184 lb 11.9 oz (83.8 kg), SpO2 99 %. GEN: NAD, elderly woman ENT: Moist mucous membranes EYES: anicteric CV: RRR, no murmurs, rubs or gallops appreciated, no LE edema PULM: No increased work of breathing, CTAB ABD: soft, distended, nontender, +BS SKIN: No rashes EXT: Moves all four extremities freely NEURO: CN2-12 grossly intact, slow movements, slow to respond  Assessment Patient with decompensated cirrhosis with recurrent chylous ascites s/p 7 paracentesis since March 2018 and new renal dysfunction since March 2018. Nephrology was consulted to help guide diuretic management in setting of renal dysfunction. Patient with hepatic encephalopathy, self-admitted inconsistency of medication administration, NSAID use, and dietary indiscretion. 1. Decompensated cirrhosis with recurrent ascites, possibly recurrent chylous 2. 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 T2DM - has been on metformin, insulin  Plan 1. Fluid restriction 1.5L, Na restriction 2g 2. No NSAIDs, no Metformin 3. Consult to dietician for further education on low  salt diet 4. Will follow renal function in AM and possibly restart diuretics  Natalie Grieve, MD PGY-2 09/03/2016, 2:55 PM

## 2016-09-03 NOTE — Consult Note (Signed)
   [facility] CM Inpatient Consult   09/03/2016  Natalie Lane 11-30-1945 [id number]  Patient was assessed for community care management needs.  Met with patient and family for assessment of re-admission currently in observation status with Ascites from cirrhosis of the liver.  Patient states she already has someone that suppose to come to her home on Monday.  States she is not sure she will be home by then.  Family member at the bedside. Patient endorse that she is [name] with Dr. Carol Lane.  She saw her primary care provider on last week.  Patient is being followed by [facility] for her transition of care needs. Patient denies issues with her medications, uses [employer], or transportation. Patient accepted a brochure and contact information for follow up but did not sign up for [facility] Management.  For questions, please contact:  Natalie Brood, RN BSN [facility] Liaison  [phone] business mobile phone Toll free office [phone]

## 2016-09-03 NOTE — Consult Note (Signed)
Referring Provider: Dr. Wendee Beavers Primary Care Physician:  Carol Ada, MD Primary Gastroenterologist:  Dr. Michail Sermon  Reason for Consultation:  Ascites; Cirrhosis  HPI: Natalie Lane is a 71 y.o. female with cryptogenic decompensated cirrhosis with refractory ascites who has required periodic paracenteses. She was recently hospitalized earlier this month and had 6 L of fluid removed on a paracentesis. Her diuretics were held due to renal insufficiency and she was seen in f/u by me and her diuretics were restarted with close f/u of her serum Cr. Referral to renal was made but patient developed shortness of breath and worsened abdominal distention with mild abdominal pain over the last 2 days. +N without vomiting. Denies melena or hematochezia. S/P 9 liters withdrawn on paracentesis today. Feels better since paracentesis done.  Past Medical History:  Diagnosis Date  . Anemia   . Ascites   . CHD (congenital heart disease)    a. 1979 s/p septal defect repair (? VSD vs ASD), old op notes not available;  b. 04/2016 Echo: EF 60-65%, no rwma, Gr2 DD, mild AS, mild TR, no PFO (prev repaired).  . Chronic diastolic CHF (congestive heart failure) ([place])    04/2016 Echo: EF 60-65%, no rwma, Gr2 DD.  Marland Kitchen Complication of anesthesia   . Cryptogenic cirrhosis ([place])    a. 04/2016 s/p paracentesis.  . Diabetes mellitus, type 2 ([place])   . Dyspnea   . Edema   . Gastric varices    a. s/p banding.  Marland Kitchen GERD (gastroesophageal reflux disease)   . History of blood transfusion   . Hyperlipidemia   . Normal stress echocardiogram 2008  . Pancreatic adenoma   . Paroxysmal atrial flutter ([place])    a. 11/2009 s/p RFCA.  Marland Kitchen PONV (postoperative nausea and vomiting)    with Cholecystectomy  . Tobacco abuse    a. smokes 4 cigarettes/day.    Past Surgical History:  Procedure Laterality Date  . ABDOMINAL HYSTERECTOMY    . ASD versus VSD repair  1979   Operative notes not available.  . ATRIAL ABLATION SURGERY  10 /2011  .  [place]   x2  . CHOLECYSTECTOMY    . ESOPHAGOGASTRODUODENOSCOPY (EGD) WITH PROPOFOL N/A 11/16/2013   Procedure: ESOPHAGOGASTRODUODENOSCOPY (EGD) WITH PROPOFOL;  Surgeon: Lear Ng, MD;  Location: [place];  Service: Endoscopy;  Laterality: N/A;  . ESOPHAGOGASTRODUODENOSCOPY (EGD) WITH PROPOFOL N/A 01/23/2014   Procedure: ESOPHAGOGASTRODUODENOSCOPY (EGD) WITH PROPOFOL;  Surgeon: Lear Ng, MD;  Location: [place];  Service: Endoscopy;  Laterality: N/A;  . GASTRIC VARICES BANDING N/A 11/16/2013   Procedure: GASTRIC VARICES BANDING;  Surgeon: Lear Ng, MD;  Location: [place];  Service: Endoscopy;  Laterality: N/A;  . GASTRIC VARICES BANDING N/A 01/23/2014   Procedure: GASTRIC VARICES BANDING;  Surgeon: Lear Ng, MD;  Location: [place];  Service: Endoscopy;  Laterality: N/A;  . HERNIA REPAIR    . IR GENERIC HISTORICAL  05/11/2016   IR PARACENTESIS 05/11/2016 Ascencion Dike, PA-C [facility]INTERV RAD  . IR PARACENTESIS  07/08/2016  . IR PARACENTESIS  08/09/2016  . IR PARACENTESIS  08/24/2016  . IR PARACENTESIS  09/03/2016  . OTHER SURGICAL HISTORY     partial hysterectomy    Prior to Admission medications   Medication Sig Start Date End Date Taking? Authorizing Provider  diphenhydrAMINE (BENADRYL) 25 MG tablet Take 25 mg by mouth every 6 (six) hours as needed for allergies.   Yes [provider]  furosemide (LASIX) 40 MG tablet Take  40 mg by mouth daily as needed for edema. 07/30/16  Yes [provider]  glimepiride (AMARYL) 2 MG tablet Take 1 tablet (2 mg total) by mouth 2 (two) times daily before a meal. Patient taking differently: Take 1 mg by mouth 2 (two) times daily.  02/26/16  Yes Philemon Kingdom, MD  insulin NPH Human (NOVOLIN N) 100 UNIT/ML injection Inject 20 units twice daily before meals. Patient taking differently: Inject 15-25 Units into the skin See admin instructions. Inject 25 units into the skin in the  morning and 15 units into the skin at night 08/10/16  Yes Elayne Snare, MD  lactose free nutrition (BOOST) LIQD Take 237 mLs by mouth daily.   Yes [provider]  lactulose (CHRONULAC) 10 GM/15ML solution Take 10 g by mouth 2 (two) times daily as needed for mild constipation.    Yes [provider]  metFORMIN (GLUCOPHAGE-XR) 500 MG 24 hr tablet Take 1 tablet (500 mg total) by mouth 2 (two) times daily with a meal. 06/25/16  Yes Philemon Kingdom, MD  ondansetron (ZOFRAN-ODT) 4 MG disintegrating tablet Take 4 mg by mouth daily as needed for nausea. 08/31/16  Yes [provider]  propranolol (INDERAL) 20 MG tablet Take 20 mg by mouth 2 (two) times daily.   Yes [provider]  ranitidine (ZANTAC) 150 MG capsule Take 150 mg by mouth daily as needed for heartburn.   Yes [provider]  Simethicone (GAS-X PO) Take 1 tablet by mouth daily as needed (gas).   Yes [provider]  spironolactone (ALDACTONE) 100 MG tablet Take 100 mg by mouth daily. 08/13/16  Yes [provider]  amiodarone (PACERONE) 200 MG tablet Take 1 tablet (200 mg total) by mouth daily. Patient not taking: Reported on 09/02/2016 07/16/16   Martinique, Peter M, MD  metoprolol tartrate (LOPRESSOR) 25 MG tablet Take 0.5 tablets (12.5 mg total) by mouth 2 (two) times daily. Patient not taking: Reported on 09/02/2016 06/30/16   Oswald Hillock, MD    Scheduled Meds: . amiodarone  200 mg Oral Daily  . famotidine  20 mg Oral Daily  . insulin aspart  0-15 Units Subcutaneous Q4H  . lidocaine (PF)      . propranolol  20 mg Oral BID  . sodium chloride flush  3 mL Intravenous Q12H   Continuous Infusions: . sodium chloride     PRN Meds:.acetaminophen **OR** acetaminophen, diphenhydrAMINE, lactulose, ondansetron **OR** ondansetron (ZOFRAN) IV, oxyCODONE  Allergies as of 09/02/2016 - Review Complete 09/02/2016  Allergen Reaction Noted  . Monosodium glutamate Anaphylaxis 05/10/2016  . Red  dye Anaphylaxis 06/25/2010  . Erythromycin Other (See Comments) 06/25/2010  . Penicillins Other (See Comments) 06/25/2010  . Statins Other (See Comments) 01/23/2014  . Sulfa antibiotics Hives 08/30/2010  . Glipizide Rash 09/19/2014  . Iohexol Rash 09/01/2011  . Spironolactone Palpitations and Other (See Comments) 05/10/2016  . Tape Rash 07/28/2016    Family History  Problem Relation Age of Onset  . Hypertension Mother   . Kidney disease Sister   . Depression Paternal Grandfather   . Diabetes Brother     Social History   Social History  . Marital status: Married    Spouse name: N/A  . Number of children: 3  . Years of education: N/A   Occupational History  . Office management [employer]   Social History Main Topics  . Smoking status: Current Every Day Smoker    Packs/day: 0.50    Years: 36.00  Types: Cigarettes  . Smokeless tobacco: Never Used     Comment: was smoking 4 cigs/day prior to admission.  . Alcohol use No  . Drug use: No  . Sexual activity: Not on file   Other Topics Concern  . Not on file   Social History Narrative   Lives in [place] with boyfriend.  Does not routinely exercise.    Review of Systems: All negative except as stated above in HPI.  Physical Exam: Vital signs: Vitals:   09/03/16 1159 09/03/16 1214  BP: (!) 74/35 (!) 84/45  Pulse: 100   Resp:    Temp:    R 17; T 98.4  Last BM Date: 08/31/16 General:  Lethargic, elderly, no acute distress HEENT: anicteric sclera, oropharynx clear Lungs:  Clear throughout to auscultation.   No wheezes, crackles, or rhonchi. No acute distress. Heart:  Regular rate and rhythm; no murmurs, clicks, rubs,  or gallops. Abdomen: soft, mild distention, nontender, +BS  Rectal:  Deferred Ext: trace LE edema  GI:  Lab Results:  Recent Labs  09/02/16 1740 09/03/16 0434  WBC 8.0 6.9  HGB 12.9 12.1  HCT 37.9 37.3  PLT 224 216   BMET  Recent Labs  09/02/16 1740 09/03/16 0434  NA 134* 139   K 5.5* 5.1  CL 106 109  CO2 19* 22  GLUCOSE 130* 82  BUN 57* 55*  CREATININE 2.48* 2.37*  CALCIUM 9.3 9.2   LFT  Recent Labs  09/03/16 0434  PROT 5.9*  ALBUMIN 2.8*  AST 22  ALT 17  ALKPHOS 38  BILITOT 1.0   PT/INR  Recent Labs  09/02/16 1850  LABPROT 16.1*  INR 1.28     Studies/Results: Dg Chest 2 View  Result Date: 09/02/2016 CLINICAL DATA:  Shortness of breath. EXAM: CHEST  2 VIEW COMPARISON:  08/23/2016 FINDINGS: Low lung volumes. Bibasilar atelectasis/scarring again noted. Cardiopericardial silhouette is at upper limits of normal for size. The visualized bony structures of the thorax are intact. Telemetry leads overlie the chest. IMPRESSION: Stable exam.  Basilar chronic atelectasis or scarring. Electronically Signed   By: Misty Stanley M.D.   On: 09/02/2016 18:32   US Renal  Result Date: 09/03/2016 CLINICAL DATA:  Acute renal insufficiency. History of cirrhosis and diabetes mellitus. EXAM: RENAL / URINARY TRACT ULTRASOUND COMPLETE COMPARISON:  None. FINDINGS: Right Kidney: Length: 11.3 cm. Echogenicity within normal limits. No mass or hydronephrosis visualized. Left Kidney: Length: 10.5 cm. Echogenicity within normal limits. No mass or hydronephrosis visualized. Bladder: Limited visualization. Appears normal for degree of bladder distention. Large amount abdominal ascites present. IMPRESSION: Normal appearance of the kidneys. Large volume abdominal ascites. Electronically Signed   By: Fidela Salisbury M.D.   On: 09/03/2016 08:18   Ir Paracentesis  Result Date: 09/03/2016 INDICATION: Patient with history of ascites and dyspnea. Request is made for diagnostic and therapeutic paracentesis. EXAM: ULTRASOUND GUIDED DIAGNOSTIC AND THERAPEUTIC PARACENTESIS MEDICATIONS: 10 mL 1% lidocaine COMPLICATIONS: None immediate. PROCEDURE: Informed written consent was obtained from the patient after a discussion of the risks, benefits and alternatives to treatment. A timeout was  performed prior to the initiation of the procedure. Initial ultrasound scanning demonstrates a large amount of ascites within the right lateral abdomen. The right lateral abdomen was prepped and draped in the usual sterile fashion. 1% lidocaine was used for local anesthesia. Following this, a 19 gauge, 7-cm, Yueh catheter was introduced. An ultrasound image was saved for documentation purposes. The paracentesis was performed. The catheter was removed and  a dressing was applied. The patient tolerated the procedure well without immediate post procedural complication. FINDINGS: A total of approximately 9.0 liters of chylous fluid was removed. Samples were sent to the laboratory as requested by the clinical team. IMPRESSION: Successful ultrasound-guided diagnostic and therapeutic paracentesis yielding 9.0 liters of peritoneal fluid. Read by:  Brynda Greathouse PA-C Electronically Signed   By: Jacqulynn Cadet M.D.   On: 09/03/2016 11:37    Impression/Plan: Decompensated cirrhosis with refractory ascites in the setting of renal insufficiency. S/P 9 L removed on a paracentesis today. No SBP on ascitic fluid. Renal consult to help with volume overload in the setting of renal insufficiency. I do not think she has hepatorenal syndrome but if her Cr continues to rise then she may need further evaluation for that. Follow serum Cr closely. MELD score 17. Earlier this year her MELD score was too low to warrant referral to a transplant center but now with the rise in her Cr her MELD is up to 17 and she may need referral as an outpt. TIPS is an option for her ascites and has been discussed but she was undecided at that time and would hold off for now due to increased risk of hepatic encephalopathy. Hopefully, with guidance from renal a good diuretic regimen can be found for her minimizing need for recurrent paracenteses. Low sodium diet. Dr. Therisa Doyne from [facility] will f/u this weekend.    LOS: 0 days   [place] C.   09/03/2016, 12:34 PM  Pager [phone]  AFTER 5 pm or on weekends please call [phone]

## 2016-09-03 NOTE — Progress Notes (Signed)
Hypoglycemic Event  CBG: 39 @ 0330  Treatment: D50 IV 25 mL  Symptoms: Headache, shaky  Follow-up CBG: Time:0355 CBG Result:110  Possible Reasons for Event: Medication regime: levemir 10 units, q 4 cbgs with coverage  Comments/MD notified: MD discontinued levemir    Natalie Lane, [name]

## 2016-09-03 NOTE — Consult Note (Addendum)
[place] Nurse wound consult note Reason for Consult: Consult requested for right buttock Wound type: Pt states a wound has been in this location for longer than 3 weeks and she is not sure of the etiology Pressure Injury POA: This was present on admission but is NOT a pressure injury Measurement: .5X.5cm dry scab removes easily, revealing full thickness wound with .2cm depth, red wound bed and small amt bleeding. Dressing procedure/placement/frequency: Foam dressing to protect and promote healing.  Discussed plan of care with patient and she verbalized understanding. Please re-consult if further assistance is needed.  Thank-you,  Julien Girt MSN, [place], [place], [place], [place]

## 2016-09-03 NOTE — Procedures (Signed)
PROCEDURE SUMMARY:  Successful US guided paracentesis from right lateral abdomen.  Yielded 9.0 liters of chylous fluid.  No immediate complications.  Pt tolerated well.   Specimen was sent for labs.  Docia Barrier PA-C 09/03/2016 11:31 AM

## 2016-09-03 NOTE — Progress Notes (Signed)
  RD consulted for nutrition education regarding cirrhosis and diabetes.   Lab Results  Component Value Date   HGBA1C 6.7 05/31/2016   RD provided "Cirrhosis Medical Nutrition Therpay" handout from the Academy of [employer]. Discussed how to read a food lab, eating small frequent meals throughout the day, and limiting sodium intake to 2g per day. Discussed which foods to limit and what to look for when grocery shopping.   RD provided "Carbohydrate Counting for People with Diabetes" handout from the Academy of [employer] and [employer]etics. Discussed different food groups and their effects on blood sugar, emphasizing carbohydrate-containing foods. Provided list of carbohydrates and recommended serving sizes of common foods.   Discussed importance of controlled and consistent carbohydrate intake throughout the day. Provided examples of ways to balance meals/snacks and encouraged intake of high-fiber, whole grain complex carbohydrates. Teach back method used.  Expect fair compliance.  Body mass index is 32.73 kg/m. Pt meets criteria for obese based on current BMI.  Current diet order is Heart Healthy/carb modified.  Mariana Single RD, LDN Pager # - [phone]

## 2016-09-03 NOTE — Progress Notes (Signed)
Patient's BP is 94/55 MAP 64. Text paged Wendee Beavers to make aware.

## 2016-09-03 NOTE — Progress Notes (Signed)
Inpatient Diabetes Program Recommendations  AACE/ADA: New Consensus Statement on Inpatient Glycemic Control (2015)  Target Ranges:  Prepandial:   less than 140 mg/dL      Peak postprandial:   less than 180 mg/dL (1-2 hours)      Critically ill patients:  140 - 180 mg/dL   Lab Results  Component Value Date   GLUCAP 95 09/03/2016   HGBA1C 6.7 05/31/2016    Review of Glycemic Control Results for CRIMSON, DUBBERLY (MRN [id number]) as of 09/03/2016 10:19  Ref. Range 09/03/2016 00:16 09/03/2016 03:30 09/03/2016 03:57 09/03/2016 06:51 09/03/2016 08:24  Glucose-Capillary Latest Ref Range: 65 - 99 mg/dL 130 (H) 39 (LL) 110 (H) 60 (L) 95   Diabetes history: DM2 Outpatient Diabetes medications: NPH insulin 25 units am + 15 units pm + Amaryl 1 mg bid Current orders for Inpatient glycemic control: Novolog correction moderate q 4 hrs.  Inpatient Diabetes Program Recommendations:  Noted hypoglycemica. Please consider decrease in Novolog correction to sensitive tid only. Will follow.  Thank you, Nani Gasser. Percy Winterrowd, RN, MSN, CDE  Diabetes Coordinator Inpatient Glycemic Control Team Team Pager [phone] (8am-5pm) 09/03/2016 10:46 AM

## 2016-09-03 NOTE — Progress Notes (Signed)
PROGRESS NOTE    Natalie Lane  ZOX:[id number] DOB: 02-07-1946 DOA: 09/02/2016 PCP: Carol Ada, MD    Brief Narrative:   71 y.o. female with medical history significant for cryptogenic cirrhosis with ascites, paroxysmal atrial flutter, and insulin-dependent diabetes mellitus, now presenting to the emergency department for evaluation of progressive abdominal distention, now causing shortness of breath and pain. Patient had 6 L of ascitic fluid removed by paracentesis on 08/24/2016 and reports doing fairly well for several days after that before her abdomen began to distend again.  Patient presented again to the hospital with abdominal distention and subsequently had therapeutic paracentesis which yielded 9 L. I consulted patient's GI specialist who evaluated patient and recommended nephrology consult. I subsequently consulted nephrology as well   Assessment & Plan:   Principal Problem:   Cirrhosis of liver with ascites ([place]) - Has been difficult to diuresis given hypotension. Consult placed to nephrology for assistance with diuresis if we are able to. - Abdominal discomfort much improved after paracentesis. - We'll continue to monitor and consulted GI to see if we could adjust medication regimen to prevent readmission.  Active Problems:   Atrial flutter ([facility]) - Stable on propranolol but has been difficult to administer secondary to hypotension - Continue amiodarone -Not anticoagulated given cirrhosis with varices    Poorly controlled type 2 diabetes mellitus with peripheral neuropathy ([place]) - Will continue SSI    Acute kidney injury [facility]) - Nephrology consulted    Hyperkalemia - Resolved    Hypotension - Continues to be an ongoing problem patient was given albumin. We'll continue to monitor    Ascites of liver   SOB (shortness of breath) - Resolved after paracentesis most likely secondary to increase intra-abdominal pressure from ascites   DVT prophylaxis: SCD's Code  Status: Full Family Communication: None at bedside. Disposition Plan: Once cleared for discharge by GI and no hypotension   Consultants:   GI  Nephrology   Procedures: Paracentesis   Antimicrobials: None   Subjective: The patient has no new complaints currently. States much improvement after paracentesis.  Objective: Vitals:   09/03/16 1234 09/03/16 1236 09/03/16 1732 09/03/16 1733  BP: (!) 86/45 98/60 96/60  (!) 94/55  Pulse:      Resp:      Temp:      TempSrc:      SpO2:      Weight:      Height:        Intake/Output Summary (Last 24 hours) at 09/03/16 1904 Last data filed at 09/03/16 1707  Gross per 24 hour  Intake             67.5 ml  Output             9300 ml  Net          -9232.5 ml   Filed Weights   09/03/16 0500 09/03/16 0525  Weight: 83.8 kg (184 lb 11.9 oz) 83.8 kg (184 lb 11.9 oz)    Examination:  General exam: Appears calm and comfortable  Respiratory system: Clear to auscultation. Respiratory effort normal. Cardiovascular system: S1 & S2 heard, RRR. No JVD, murmurs, rubs, gallops or clicks. No pedal edema. Gastrointestinal system: Abdomen is distended, soft and nontender. No guarding Central nervous system: Alert and oriented. No focal neurological deficits. Extremities: Symmetric 5 x 5 power. Skin: No rashes, lesions or ulcers, on limited exam Psychiatry: Judgement and insight appear normal. Mood & affect appropriate.   Data Reviewed: I have personally reviewed  following labs and imaging studies  CBC:  Recent Labs Lab 09/02/16 1740 09/03/16 0434  WBC 8.0 6.9  NEUTROABS 5.7  --   HGB 12.9 12.1  HCT 37.9 37.3  MCV 95.2 94.4  PLT 224 401   Basic Metabolic Panel:  Recent Labs Lab 09/02/16 1740 09/03/16 0434  NA 134* 139  K 5.5* 5.1  CL 106 109  CO2 19* 22  GLUCOSE 130* 82  BUN 57* 55*  CREATININE 2.48* 2.37*  CALCIUM 9.3 9.2   GFR: Estimated Creatinine Clearance: 23 mL/min (A) (by C-G formula based on SCr of 2.37 mg/dL  (H)). Liver Function Tests:  Recent Labs Lab 09/02/16 1740 09/03/16 0434  AST 30 22  ALT 19 17  ALKPHOS 43 38  BILITOT 0.8 1.0  PROT 6.4* 5.9*  ALBUMIN 3.3* 2.8*   No results for input(s): LIPASE, AMYLASE in the last 168 hours. No results for input(s): AMMONIA in the last 168 hours. Coagulation Profile:  Recent Labs Lab 09/02/16 1850  INR 1.28   Cardiac Enzymes: No results for input(s): CKTOTAL, CKMB, CKMBINDEX, TROPONINI in the last 168 hours. BNP (last 3 results) No results for input(s): PROBNP in the last 8760 hours. HbA1C: No results for input(s): HGBA1C in the last 72 hours. CBG:  Recent Labs Lab 09/03/16 0357 09/03/16 0651 09/03/16 0824 09/03/16 1218 09/03/16 1625  GLUCAP 110* 60* 95 77 243*   Lipid Profile: No results for input(s): CHOL, HDL, LDLCALC, TRIG, CHOLHDL, LDLDIRECT in the last 72 hours. Thyroid Function Tests: No results for input(s): TSH, T4TOTAL, FREET4, T3FREE, THYROIDAB in the last 72 hours. Anemia Panel: No results for input(s): VITAMINB12, FOLATE, FERRITIN, TIBC, IRON, RETICCTPCT in the last 72 hours. Sepsis Labs: No results for input(s): PROCALCITON, LATICACIDVEN in the last 168 hours.  Recent Results (from the past 240 hour(s))  Gram stain     Status: None   Collection Time: 09/03/16 11:18 AM  Result Value Ref Range Status   Specimen Description PERITONEAL  Final   Special Requests NONE  Final   Gram Stain   Final    RARE WBC PRESENT, PREDOMINANTLY MONONUCLEAR NO ORGANISMS SEEN    Report Status 09/03/2016 FINAL  Final     Radiology Studies: Dg Chest 2 View  Result Date: 09/02/2016 CLINICAL DATA:  Shortness of breath. EXAM: CHEST  2 VIEW COMPARISON:  08/23/2016 FINDINGS: Low lung volumes. Bibasilar atelectasis/scarring again noted. Cardiopericardial silhouette is at upper limits of normal for size. The visualized bony structures of the thorax are intact. Telemetry leads overlie the chest. IMPRESSION: Stable exam.  Basilar  chronic atelectasis or scarring. Electronically Signed   By: Misty Stanley M.D.   On: 09/02/2016 18:32   US Renal  Result Date: 09/03/2016 CLINICAL DATA:  Acute renal insufficiency. History of cirrhosis and diabetes mellitus. EXAM: RENAL / URINARY TRACT ULTRASOUND COMPLETE COMPARISON:  None. FINDINGS: Right Kidney: Length: 11.3 cm. Echogenicity within normal limits. No mass or hydronephrosis visualized. Left Kidney: Length: 10.5 cm. Echogenicity within normal limits. No mass or hydronephrosis visualized. Bladder: Limited visualization. Appears normal for degree of bladder distention. Large amount abdominal ascites present. IMPRESSION: Normal appearance of the kidneys. Large volume abdominal ascites. Electronically Signed   By: Fidela Salisbury M.D.   On: 09/03/2016 08:18   Ir Paracentesis  Result Date: 09/03/2016 INDICATION: Patient with history of ascites and dyspnea. Request is made for diagnostic and therapeutic paracentesis. EXAM: ULTRASOUND GUIDED DIAGNOSTIC AND THERAPEUTIC PARACENTESIS MEDICATIONS: 10 mL 1% lidocaine COMPLICATIONS: None immediate. PROCEDURE: Informed written  consent was obtained from the patient after a discussion of the risks, benefits and alternatives to treatment. A timeout was performed prior to the initiation of the procedure. Initial ultrasound scanning demonstrates a large amount of ascites within the right lateral abdomen. The right lateral abdomen was prepped and draped in the usual sterile fashion. 1% lidocaine was used for local anesthesia. Following this, a 19 gauge, 7-cm, Yueh catheter was introduced. An ultrasound image was saved for documentation purposes. The paracentesis was performed. The catheter was removed and a dressing was applied. The patient tolerated the procedure well without immediate post procedural complication. FINDINGS: A total of approximately 9.0 liters of chylous fluid was removed. Samples were sent to the laboratory as requested by the clinical  team. IMPRESSION: Successful ultrasound-guided diagnostic and therapeutic paracentesis yielding 9.0 liters of peritoneal fluid. Read by:  Brynda Greathouse PA-C Electronically Signed   By: Jacqulynn Cadet M.D.   On: 09/03/2016 11:37     Scheduled Meds: . amiodarone  200 mg Oral Daily  . famotidine  20 mg Oral Daily  . [START ON 09/04/2016] feeding supplement (GLUCERNA SHAKE)  237 mL Oral BID BM  . insulin aspart  0-15 Units Subcutaneous Q4H  . lidocaine (PF)      . propranolol  20 mg Oral BID  . sodium chloride flush  3 mL Intravenous Q12H   Continuous Infusions: . sodium chloride       LOS: 0 days    Time spent: > 35 minutes  Velvet Bathe, MD [facility] Pager [phone]  If 7PM-7AM, please contact night-coverage [email or web address] Password [email or web address] 09/03/2016, 7:04 PM

## 2016-09-03 NOTE — Progress Notes (Signed)
NURSING PROGRESS NOTE  Natalie Lane [id number] Transfer Data: 09/03/2016 12:15 PM Attending Provider: Velvet Bathe, MD VVK:PQAES, Candace, MD Code Status: full   Natalie Lane is a 71 y.o. female patient transferred from radiology   -No acute distress noted.  -No complaints of shortness of breath.  -No complaints of chest pain.   Cardiac Monitoring: Box # 33 in place.   BP- 74/53, MAP-57, HR- 105  IV Fluids:  IV in place, occlusive dsg intact without redness, IV cath forearm right,  D5W/0.45 NaCl.   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  MD orders: sodium chloride 0.9 % bolus 250 mL    Will continue to evaluate and treat per MD orders

## 2016-09-04 LAB — BASIC METABOLIC PANEL
ANION GAP: 8 (ref 5–15)
BUN: 42 mg/dL — ABNORMAL HIGH (ref 6–20)
CALCIUM: 8.7 mg/dL — AB (ref 8.9–10.3)
CO2: 19 mmol/L — AB (ref 22–32)
Chloride: 110 mmol/L (ref 101–111)
Creatinine, Ser: 1.82 mg/dL — ABNORMAL HIGH (ref 0.44–1.00)
GFR, EST AFRICAN AMERICAN: 32 mL/min — AB (ref >60)
GFR, EST NON AFRICAN AMERICAN: 27 mL/min — AB (ref >60)
Glucose, Bld: 99 mg/dL (ref 65–99)
Potassium: 5.1 mmol/L (ref 3.5–5.1)
Sodium: 137 mmol/L (ref 135–145)

## 2016-09-04 LAB — GLUCOSE, CAPILLARY
GLUCOSE-CAPILLARY: 102 mg/dL — AB (ref 65–99)
GLUCOSE-CAPILLARY: 177 mg/dL — AB (ref 65–99)
GLUCOSE-CAPILLARY: 223 mg/dL — AB (ref 65–99)
GLUCOSE-CAPILLARY: 64 mg/dL — AB (ref 65–99)
GLUCOSE-CAPILLARY: 91 mg/dL (ref 65–99)
Glucose-Capillary: 92 mg/dL (ref 65–99)
Glucose-Capillary: 200 mg/dL — ABNORMAL HIGH (ref 65–99)

## 2016-09-04 MED ORDER — INSULIN ASPART 100 UNIT/ML ~~LOC~~ SOLN
0.0000 [IU] | Freq: Three times a day (TID) | SUBCUTANEOUS | Status: DC
  Administered 2016-09-04: 5 [IU] via SUBCUTANEOUS
  Administered 2016-09-05: 3 [IU] via SUBCUTANEOUS
  Administered 2016-09-05: 5 [IU] via SUBCUTANEOUS

## 2016-09-04 MED ORDER — LACTULOSE 10 GM/15ML PO SOLN
20.0000 g | Freq: Three times a day (TID) | ORAL | Status: DC
  Administered 2016-09-04 – 2016-09-05 (×3): 20 g via ORAL
  Filled 2016-09-04 (×3): qty 30

## 2016-09-04 MED ORDER — FUROSEMIDE 80 MG PO TABS
80.0000 mg | ORAL_TABLET | Freq: Two times a day (BID) | ORAL | Status: DC
  Administered 2016-09-04 – 2016-09-05 (×4): 80 mg via ORAL
  Filled 2016-09-04 (×4): qty 1

## 2016-09-04 NOTE — Progress Notes (Signed)
Subjective: The patient was seen and examined at bedside. She reports being extremely comfortable after paracentesis yesterday. She has taken lactulose twice but has not had a bowel movement. She denies any abdominal pain, nausea or vomiting. She is able to tolerate oral diet.  Objective: Vital signs in last 24 hours: Temp:  [98.1 F (36.7 C)-98.4 F (36.9 C)] 98.1 F (36.7 C) (07/21 0647) Pulse Rate:  [57-83] 58 (07/21 1006) Resp:  [17-18] 17 (07/21 0647) BP: (79-96)/(42-60) 92/58 (07/21 1006) SpO2:  [85 %] 85 % (07/21 0647) Weight change:  Last BM Date: 08/31/16  PE: Mild pallor, no icterus GENERAL: Not in acute distress ABDOMEN: Soft but distended, nontender, normoactive bowel sounds, presence of shifting dullness, no fluid thrill EXTREMITIES: No edema noted in bilateral lower extremities  Lab Results: Results for orders placed or performed during the hospital encounter of 09/02/16 (from the past 48 hour(s))  CBC with Differential     Status: Abnormal   Collection Time: 09/02/16  5:40 PM  Result Value Ref Range   WBC 8.0 4.0 - 10.5 K/uL   RBC 3.98 3.87 - 5.11 MIL/uL   Hemoglobin 12.9 12.0 - 15.0 g/dL   HCT 37.9 36.0 - 46.0 %   MCV 95.2 78.0 - 100.0 fL   MCH 32.4 26.0 - 34.0 pg   MCHC 34.0 30.0 - 36.0 g/dL   RDW 16.9 (H) 11.5 - 15.5 %   Platelets 224 150 - 400 K/uL   Neutrophils Relative % 71 %   Neutro Abs 5.7 1.7 - 7.7 K/uL   Lymphocytes Relative 15 %   Lymphs Abs 1.2 0.7 - 4.0 K/uL   Monocytes Relative 11 %   Monocytes Absolute 0.9 0.1 - 1.0 K/uL   Eosinophils Relative 2 %   Eosinophils Absolute 0.1 0.0 - 0.7 K/uL   Basophils Relative 0 %   Basophils Absolute 0.0 0.0 - 0.1 K/uL  Comprehensive metabolic panel     Status: Abnormal   Collection Time: 09/02/16  5:40 PM  Result Value Ref Range   Sodium 134 (L) 135 - 145 mmol/L   Potassium 5.5 (H) 3.5 - 5.1 mmol/L   Chloride 106 101 - 111 mmol/L   CO2 19 (L) 22 - 32 mmol/L   Glucose, Bld 130 (H) 65 - 99 mg/dL    BUN 57 (H) 6 - 20 mg/dL   Creatinine, Ser 2.48 (H) 0.44 - 1.00 mg/dL   Calcium 9.3 8.9 - 10.3 mg/dL   Total Protein 6.4 (L) 6.5 - 8.1 g/dL   Albumin 3.3 (L) 3.5 - 5.0 g/dL   AST 30 15 - 41 U/L   ALT 19 14 - 54 U/L   Alkaline Phosphatase 43 38 - 126 U/L   Total Bilirubin 0.8 0.3 - 1.2 mg/dL   GFR calc non Af Amer 19 (L) >60 mL/min   GFR calc Af Amer 22 (L) >60 mL/min    Comment: (NOTE) The eGFR has been calculated using the CKD EPI equation. This calculation has not been validated in all clinical situations. eGFR's persistently <60 mL/min signify possible Chronic Kidney Disease.    Anion gap 9 5 - 15  I-stat troponin, ED     Status: None   Collection Time: 09/02/16  6:00 PM  Result Value Ref Range   Troponin i, poc 0.01 0.00 - 0.08 ng/mL   Comment 3            Comment: Due to the release kinetics of cTnI, a negative result within the  first hours of the onset of symptoms does not rule out myocardial infarction with certainty. If myocardial infarction is still suspected, repeat the test at appropriate intervals.   Protime-INR     Status: Abnormal   Collection Time: 09/02/16  6:50 PM  Result Value Ref Range   Prothrombin Time 16.1 (H) 11.4 - 15.2 seconds   INR 1.28   CBG monitoring, ED     Status: None   Collection Time: 09/02/16  8:38 PM  Result Value Ref Range   Glucose-Capillary 75 65 - 99 mg/dL  Glucose, capillary     Status: Abnormal   Collection Time: 09/03/16 12:16 AM  Result Value Ref Range   Glucose-Capillary 130 (H) 65 - 99 mg/dL  Glucose, capillary     Status: Abnormal   Collection Time: 09/03/16  3:30 AM  Result Value Ref Range   Glucose-Capillary 39 (LL) 65 - 99 mg/dL   Comment 1 Notify RN   Glucose, capillary     Status: Abnormal   Collection Time: 09/03/16  3:57 AM  Result Value Ref Range   Glucose-Capillary 110 (H) 65 - 99 mg/dL   Comment 1 Notify RN   CBC     Status: Abnormal   Collection Time: 09/03/16  4:34 AM  Result Value Ref Range   WBC 6.9  4.0 - 10.5 K/uL   RBC 3.95 3.87 - 5.11 MIL/uL   Hemoglobin 12.1 12.0 - 15.0 g/dL   HCT 37.3 36.0 - 46.0 %   MCV 94.4 78.0 - 100.0 fL   MCH 30.6 26.0 - 34.0 pg   MCHC 32.4 30.0 - 36.0 g/dL   RDW 15.9 (H) 11.5 - 15.5 %   Platelets 216 150 - 400 K/uL  Comprehensive metabolic panel     Status: Abnormal   Collection Time: 09/03/16  4:34 AM  Result Value Ref Range   Sodium 139 135 - 145 mmol/L   Potassium 5.1 3.5 - 5.1 mmol/L   Chloride 109 101 - 111 mmol/L   CO2 22 22 - 32 mmol/L   Glucose, Bld 82 65 - 99 mg/dL   BUN 55 (H) 6 - 20 mg/dL   Creatinine, Ser 2.37 (H) 0.44 - 1.00 mg/dL   Calcium 9.2 8.9 - 10.3 mg/dL   Total Protein 5.9 (L) 6.5 - 8.1 g/dL   Albumin 2.8 (L) 3.5 - 5.0 g/dL   AST 22 15 - 41 U/L   ALT 17 14 - 54 U/L   Alkaline Phosphatase 38 38 - 126 U/L   Total Bilirubin 1.0 0.3 - 1.2 mg/dL   GFR calc non Af Amer 20 (L) >60 mL/min   GFR calc Af Amer 23 (L) >60 mL/min    Comment: (NOTE) The eGFR has been calculated using the CKD EPI equation. This calculation has not been validated in all clinical situations. eGFR's persistently <60 mL/min signify possible Chronic Kidney Disease.    Anion gap 8 5 - 15  Glucose, capillary     Status: Abnormal   Collection Time: 09/03/16  6:51 AM  Result Value Ref Range   Glucose-Capillary 60 (L) 65 - 99 mg/dL   Comment 1 Notify RN   Glucose, capillary     Status: None   Collection Time: 09/03/16  8:24 AM  Result Value Ref Range   Glucose-Capillary 95 65 - 99 mg/dL  Body fluid cell count with differential     Status: Abnormal   Collection Time: 09/03/16 11:18 AM  Result Value Ref Range   Fluid  Type-FCT Peritoneal    Color, Fluid STRAW (A) YELLOW   Appearance, Fluid TURBID (A) CLEAR   WBC, Fluid 134 0 - 1,000 cu mm   Neutrophil Count, Fluid 5 0 - 25 %   Lymphs, Fluid 39 %   Monocyte-Macrophage-Serous Fluid 56 50 - 90 %   Other Cells, Fluid MESOTHELIAL %  Gram stain     Status: None   Collection Time: 09/03/16 11:18 AM  Result  Value Ref Range   Specimen Description PERITONEAL    Special Requests NONE    Gram Stain      RARE WBC PRESENT, PREDOMINANTLY MONONUCLEAR NO ORGANISMS SEEN    Report Status 09/03/2016 FINAL   Glucose, pleural or peritoneal fluid     Status: None   Collection Time: 09/03/16 11:18 AM  Result Value Ref Range   Glucose, Fluid 100 mg/dL    Comment: (NOTE) No normal range established for this test Results should be evaluated in conjunction with serum values    Fluid Type-FGLU Peritoneal   Glucose, capillary     Status: None   Collection Time: 09/03/16 12:18 PM  Result Value Ref Range   Glucose-Capillary 77 65 - 99 mg/dL  Glucose, capillary     Status: Abnormal   Collection Time: 09/03/16  4:25 PM  Result Value Ref Range   Glucose-Capillary 243 (H) 65 - 99 mg/dL  Glucose, capillary     Status: Abnormal   Collection Time: 09/03/16  8:36 PM  Result Value Ref Range   Glucose-Capillary 177 (H) 65 - 99 mg/dL  Glucose, capillary     Status: Abnormal   Collection Time: 09/04/16 12:56 AM  Result Value Ref Range   Glucose-Capillary 64 (L) 65 - 99 mg/dL  Glucose, capillary     Status: None   Collection Time: 09/04/16  1:32 AM  Result Value Ref Range   Glucose-Capillary 91 65 - 99 mg/dL  Basic metabolic panel     Status: Abnormal   Collection Time: 09/04/16  4:28 AM  Result Value Ref Range   Sodium 137 135 - 145 mmol/L   Potassium 5.1 3.5 - 5.1 mmol/L   Chloride 110 101 - 111 mmol/L   CO2 19 (L) 22 - 32 mmol/L   Glucose, Bld 99 65 - 99 mg/dL   BUN 42 (H) 6 - 20 mg/dL   Creatinine, Ser 9.13 (H) 0.44 - 1.00 mg/dL   Calcium 8.7 (L) 8.9 - 10.3 mg/dL   GFR calc non Af Amer 27 (L) >60 mL/min   GFR calc Af Amer 32 (L) >60 mL/min    Comment: (NOTE) The eGFR has been calculated using the CKD EPI equation. This calculation has not been validated in all clinical situations. eGFR's persistently <60 mL/min signify possible Chronic Kidney Disease.    Anion gap 8 5 - 15  Glucose, capillary      Status: None   Collection Time: 09/04/16  4:33 AM  Result Value Ref Range   Glucose-Capillary 92 65 - 99 mg/dL  Glucose, capillary     Status: Abnormal   Collection Time: 09/04/16  8:04 AM  Result Value Ref Range   Glucose-Capillary 102 (H) 65 - 99 mg/dL  Glucose, capillary     Status: Abnormal   Collection Time: 09/04/16 12:32 PM  Result Value Ref Range   Glucose-Capillary 177 (H) 65 - 99 mg/dL    Studies/Results: Dg Chest 2 View  Result Date: 09/02/2016 CLINICAL DATA:  Shortness of breath. EXAM: CHEST  2 VIEW COMPARISON:  08/23/2016 FINDINGS: Low lung volumes. Bibasilar atelectasis/scarring again noted. Cardiopericardial silhouette is at upper limits of normal for size. The visualized bony structures of the thorax are intact. Telemetry leads overlie the chest. IMPRESSION: Stable exam.  Basilar chronic atelectasis or scarring. Electronically Signed   By: Misty Stanley M.D.   On: 09/02/2016 18:32   US Renal  Result Date: 09/03/2016 CLINICAL DATA:  Acute renal insufficiency. History of cirrhosis and diabetes mellitus. EXAM: RENAL / URINARY TRACT ULTRASOUND COMPLETE COMPARISON:  None. FINDINGS: Right Kidney: Length: 11.3 cm. Echogenicity within normal limits. No mass or hydronephrosis visualized. Left Kidney: Length: 10.5 cm. Echogenicity within normal limits. No mass or hydronephrosis visualized. Bladder: Limited visualization. Appears normal for degree of bladder distention. Large amount abdominal ascites present. IMPRESSION: Normal appearance of the kidneys. Large volume abdominal ascites. Electronically Signed   By: Fidela Salisbury M.D.   On: 09/03/2016 08:18   Ir Paracentesis  Result Date: 09/03/2016 INDICATION: Patient with history of ascites and dyspnea. Request is made for diagnostic and therapeutic paracentesis. EXAM: ULTRASOUND GUIDED DIAGNOSTIC AND THERAPEUTIC PARACENTESIS MEDICATIONS: 10 mL 1% lidocaine COMPLICATIONS: None immediate. PROCEDURE: Informed written consent was  obtained from the patient after a discussion of the risks, benefits and alternatives to treatment. A timeout was performed prior to the initiation of the procedure. Initial ultrasound scanning demonstrates a large amount of ascites within the right lateral abdomen. The right lateral abdomen was prepped and draped in the usual sterile fashion. 1% lidocaine was used for local anesthesia. Following this, a 19 gauge, 7-cm, Yueh catheter was introduced. An ultrasound image was saved for documentation purposes. The paracentesis was performed. The catheter was removed and a dressing was applied. The patient tolerated the procedure well without immediate post procedural complication. FINDINGS: A total of approximately 9.0 liters of chylous fluid was removed. Samples were sent to the laboratory as requested by the clinical team. IMPRESSION: Successful ultrasound-guided diagnostic and therapeutic paracentesis yielding 9.0 liters of peritoneal fluid. Read by:  Brynda Greathouse PA-C Electronically Signed   By: Jacqulynn Cadet M.D.   On: 09/03/2016 11:37    Medications: I have reviewed the patient's current medications.  Assessment: 1. Decompensated cirrhosis, cryptogenic 2. Refractory ascites, renal impairment, multiple paracentesis needed  Plan: 1. Appreciate nephrology input, patient started on Lasix 80 mg a day, and is to start spironolactone 200 mg a day as tolerated, and when potassium is normal. MELDNa score 15.  2. Currently on lactulose 10 g 2 times a day, doubt any bowel movements, at home she takes it 4 times a day, increase dose accordingly.  3. On beta blocker for history of esophageal varices.   Ronnette Juniper 09/04/2016, 1:41 PM   Pager [phone] If no answer or after 5 PM call [phone]

## 2016-09-04 NOTE — Progress Notes (Signed)
Admit: 09/02/2016 LOS: 1  67F with refractory ascites and CKD3  Subjective:  Good UOP yesterday, renal function improved, K 5.1 She found dietitian very helpful, feels more informed for proper food choices   07/20 0701 - 07/21 0700 In: 0  Out: 10000 [Urine:1000]  Filed Weights   09/03/16 0500 09/03/16 0525  Weight: 83.8 kg (184 lb 11.9 oz) 83.8 kg (184 lb 11.9 oz)    Scheduled Meds: . amiodarone  200 mg Oral Daily  . famotidine  20 mg Oral Daily  . feeding supplement (GLUCERNA SHAKE)  237 mL Oral BID BM  . furosemide  80 mg Oral BID  . insulin aspart  0-15 Units Subcutaneous Q4H  . propranolol  20 mg Oral BID  . sodium chloride flush  3 mL Intravenous Q12H   Continuous Infusions: . sodium chloride     PRN Meds:.acetaminophen **OR** acetaminophen, diphenhydrAMINE, lactulose, ondansetron **OR** ondansetron (ZOFRAN) IV, oxyCODONE  Current Labs: reviewed    Physical Exam:  Blood pressure (!) 94/55, pulse (!) 57, temperature 98.1 F (36.7 C), temperature source Oral, resp. rate 17, height 5\' 3"  (1.6 m), weight 83.8 kg (184 lb 11.9 oz), SpO2 (!) 85 %. NAD Abd soft, nd, +BS CTAB RRR nl s1s2 No LEE Nonfocal, AAOx3 Pleasan t affect EOMI NCAT  A 1. CKD3 BL SCr labile around 2 2. Cirrhosis, refractory paracentesis dependent ascites 3. DM2  P 1. Give K at upper limit of normal will start with furosemide 80mg  PO BID and when K improves will add on aldactone: would do 200 aldactone and 80mg  lasix daily and goal dosing. 2. Pt rec dietitian education; cont Na and fluid restriciton 3. Will arrange close f/u with me in office as well as labs early next week   Pearson Grippe MD 09/04/2016, 9:04 AM   Recent Labs Lab 09/02/16 1740 09/03/16 0434 09/04/16 0428  NA 134* 139 137  K 5.5* 5.1 5.1  CL 106 109 110  CO2 19* 22 19*  GLUCOSE 130* 82 99  BUN 57* 55* 42*  CREATININE 2.48* 2.37* 1.82*  CALCIUM 9.3 9.2 8.7*    Recent Labs Lab 09/02/16 1740 09/03/16 0434  WBC  8.0 6.9  NEUTROABS 5.7  --   HGB 12.9 12.1  HCT 37.9 37.3  MCV 95.2 94.4  PLT 224 216

## 2016-09-04 NOTE — Progress Notes (Signed)
Notified MD of BP 84/56 manual. Pt asymptomatic. Orders to continue to monitor for now and notify MD of any changes.

## 2016-09-04 NOTE — Progress Notes (Addendum)
PROGRESS NOTE    Natalie Lane  IOX:[id number] DOB: [birth year] DOA: 09/02/2016 PCP: Carol Ada, MD    Brief Narrative:   72 y.o. female with medical history significant for cryptogenic cirrhosis with ascites, paroxysmal atrial flutter, and insulin-dependent diabetes mellitus, now presenting to the emergency department for evaluation of progressive abdominal distention, now causing shortness of breath and pain. Patient had 6 L of ascitic fluid removed by paracentesis on 08/24/2016 and reports doing fairly well for several days after that before her abdomen began to distend again.  Patient presented again to the hospital with abdominal distention and subsequently had therapeutic paracentesis which yielded 9 L. I consulted patient's GI specialist who evaluated patient and recommended nephrology consult. I subsequently consulted nephrology as well   Assessment & Plan:   Principal Problem:   Cirrhosis of liver with ascites [facility]) - Nephrology assisting with diuresis regimen. - Abdominal discomfort much improved after paracentesis. - Gi consulted  Active Problems:   Atrial flutter (HCC) - Stable on propranolol but has been difficult to administer secondary to hypotension - Continue amiodarone -Not anticoagulated given cirrhosis with varices    Poorly controlled type 2 diabetes mellitus with peripheral neuropathy ([place]) - Will continue SSI    Acute kidney injury [facility]) - Nephrology consulted    Hyperkalemia - Resolved  Addendum: DM - place on diabetic diet - continue SSI    Hypotension - resolving although blood pressures have remained soft.    Ascites of liver   SOB (shortness of breath) - Resolved after paracentesis most likely secondary to increase intra-abdominal pressure from ascites   DVT prophylaxis: SCD's Code Status: Full Family Communication: None at bedside. Disposition Plan: Once cleared for discharge by GI and nephrology. With continued improvement in next  one or 2 days   Consultants:   GI  Nephrology   Procedures: Paracentesis   Antimicrobials: None   Subjective: The patients that she feels much better  Objective: Vitals:   09/04/16 0101 09/04/16 0647 09/04/16 1006 09/04/16 1500  BP: (!) 92/54 (!) 94/55 (!) 92/58 (!) 84/55  Pulse:  (!) 57 (!) 58 86  Resp:  17  16  Temp:  98.1 F (36.7 C)    TempSrc:  Oral    SpO2:  (!) 85%  100%  Weight:      Height:        Intake/Output Summary (Last 24 hours) at 09/04/16 1658 Last data filed at 09/04/16 0900  Gross per 24 hour  Intake              240 ml  Output              700 ml  Net             -460 ml   Filed Weights   09/03/16 0500 09/03/16 0525  Weight: 83.8 kg (184 lb 11.9 oz) 83.8 kg (184 lb 11.9 oz)    Examination:  General exam: Appears calm and comfortable , No acute distress Respiratory system: Equal chest rise, no wheezes, no accessory muscle use Cardiovascular system: S1 & S2 heard, RRR. No JVD, murmurs, rubs, Gastrointestinal system: Abdomen is distended, soft and nontender. No guarding, no pain on palpation Central nervous system: Alert and oriented. No focal neurological deficits. Extremities: Symmetric 5 x 5 power. Skin: No rashes, lesions or ulcers, on limited exam Psychiatry: Judgement and insight appear normal. Mood & affect appropriate.   Data Reviewed: I have personally reviewed following labs and imaging studies  CBC:  Recent Labs Lab 09/02/16 1740 09/03/16 0434  WBC 8.0 6.9  NEUTROABS 5.7  --   HGB 12.9 12.1  HCT 37.9 37.3  MCV 95.2 94.4  PLT 224 240   Basic Metabolic Panel:  Recent Labs Lab 09/02/16 1740 09/03/16 0434 09/04/16 0428  NA 134* 139 137  K 5.5* 5.1 5.1  CL 106 109 110  CO2 19* 22 19*  GLUCOSE 130* 82 99  BUN 57* 55* 42*  CREATININE 2.48* 2.37* 1.82*  CALCIUM 9.3 9.2 8.7*   GFR: Estimated Creatinine Clearance: 29.9 mL/min (A) (by C-G formula based on SCr of 1.82 mg/dL (H)). Liver Function Tests:  Recent  Labs Lab 09/02/16 1740 09/03/16 0434  AST 30 22  ALT 19 17  ALKPHOS 43 38  BILITOT 0.8 1.0  PROT 6.4* 5.9*  ALBUMIN 3.3* 2.8*   No results for input(s): LIPASE, AMYLASE in the last 168 hours. No results for input(s): AMMONIA in the last 168 hours. Coagulation Profile:  Recent Labs Lab 09/02/16 1850  INR 1.28   Cardiac Enzymes: No results for input(s): CKTOTAL, CKMB, CKMBINDEX, TROPONINI in the last 168 hours. BNP (last 3 results) No results for input(s): PROBNP in the last 8760 hours. HbA1C: No results for input(s): HGBA1C in the last 72 hours. CBG:  Recent Labs Lab 09/04/16 0056 09/04/16 0132 09/04/16 0433 09/04/16 0804 09/04/16 1232  GLUCAP 64* 91 92 102* 177*   Lipid Profile: No results for input(s): CHOL, HDL, LDLCALC, TRIG, CHOLHDL, LDLDIRECT in the last 72 hours. Thyroid Function Tests: No results for input(s): TSH, T4TOTAL, FREET4, T3FREE, THYROIDAB in the last 72 hours. Anemia Panel: No results for input(s): VITAMINB12, FOLATE, FERRITIN, TIBC, IRON, RETICCTPCT in the last 72 hours. Sepsis Labs: No results for input(s): PROCALCITON, LATICACIDVEN in the last 168 hours.  Recent Results (from the past 240 hour(s))  Gram stain     Status: None   Collection Time: 09/03/16 11:18 AM  Result Value Ref Range Status   Specimen Description PERITONEAL  Final   Special Requests NONE  Final   Gram Stain   Final    RARE WBC PRESENT, PREDOMINANTLY MONONUCLEAR NO ORGANISMS SEEN    Report Status 09/03/2016 FINAL  Final  Culture, body fluid-bottle     Status: None (Preliminary result)   Collection Time: 09/03/16 11:18 AM  Result Value Ref Range Status   Specimen Description PERITONEAL  Final   Special Requests NONE  Final   Culture NO GROWTH 1 DAY  Final   Report Status PENDING  Incomplete     Radiology Studies: Dg Chest 2 View  Result Date: 09/02/2016 CLINICAL DATA:  Shortness of breath. EXAM: CHEST  2 VIEW COMPARISON:  08/23/2016 FINDINGS: Low lung volumes.  Bibasilar atelectasis/scarring again noted. Cardiopericardial silhouette is at upper limits of normal for size. The visualized bony structures of the thorax are intact. Telemetry leads overlie the chest. IMPRESSION: Stable exam.  Basilar chronic atelectasis or scarring. Electronically Signed   By: Misty Stanley M.D.   On: 09/02/2016 18:32   US Renal  Result Date: 09/03/2016 CLINICAL DATA:  Acute renal insufficiency. History of cirrhosis and diabetes mellitus. EXAM: RENAL / URINARY TRACT ULTRASOUND COMPLETE COMPARISON:  None. FINDINGS: Right Kidney: Length: 11.3 cm. Echogenicity within normal limits. No mass or hydronephrosis visualized. Left Kidney: Length: 10.5 cm. Echogenicity within normal limits. No mass or hydronephrosis visualized. Bladder: Limited visualization. Appears normal for degree of bladder distention. Large amount abdominal ascites present. IMPRESSION: Normal appearance of the kidneys. Large volume abdominal  ascites. Electronically Signed   By: Fidela Salisbury M.D.   On: 09/03/2016 08:18   Ir Paracentesis  Result Date: 09/03/2016 INDICATION: Patient with history of ascites and dyspnea. Request is made for diagnostic and therapeutic paracentesis. EXAM: ULTRASOUND GUIDED DIAGNOSTIC AND THERAPEUTIC PARACENTESIS MEDICATIONS: 10 mL 1% lidocaine COMPLICATIONS: None immediate. PROCEDURE: Informed written consent was obtained from the patient after a discussion of the risks, benefits and alternatives to treatment. A timeout was performed prior to the initiation of the procedure. Initial ultrasound scanning demonstrates a large amount of ascites within the right lateral abdomen. The right lateral abdomen was prepped and draped in the usual sterile fashion. 1% lidocaine was used for local anesthesia. Following this, a 19 gauge, 7-cm, Yueh catheter was introduced. An ultrasound image was saved for documentation purposes. The paracentesis was performed. The catheter was removed and a dressing was  applied. The patient tolerated the procedure well without immediate post procedural complication. FINDINGS: A total of approximately 9.0 liters of chylous fluid was removed. Samples were sent to the laboratory as requested by the clinical team. IMPRESSION: Successful ultrasound-guided diagnostic and therapeutic paracentesis yielding 9.0 liters of peritoneal fluid. Read by:  Brynda Greathouse PA-C Electronically Signed   By: Jacqulynn Cadet M.D.   On: 09/03/2016 11:37     Scheduled Meds: . amiodarone  200 mg Oral Daily  . famotidine  20 mg Oral Daily  . feeding supplement (GLUCERNA SHAKE)  237 mL Oral BID BM  . furosemide  80 mg Oral BID  . insulin aspart  0-15 Units Subcutaneous Q4H  . lactulose  20 g Oral TID  . propranolol  20 mg Oral BID  . sodium chloride flush  3 mL Intravenous Q12H   Continuous Infusions: . sodium chloride       LOS: 1 day    Time spent: > 35 minutes  Velvet Bathe, MD [facility] Pager [phone]  If 7PM-7AM, please contact night-coverage [email or web address] Password [facility] 09/04/2016, 4:58 PM

## 2016-09-05 DIAGNOSIS — R188 Other ascites: Secondary | ICD-10-CM

## 2016-09-05 LAB — GLUCOSE, CAPILLARY
GLUCOSE-CAPILLARY: 155 mg/dL — AB (ref 65–99)
Glucose-Capillary: 269 mg/dL — ABNORMAL HIGH (ref 65–99)

## 2016-09-05 MED ORDER — LACTULOSE 10 GM/15ML PO SOLN: 20.0000 g | Freq: Three times a day (TID) | ORAL | 0 refills | Status: DC

## 2016-09-05 MED ORDER — FUROSEMIDE 80 MG PO TABS: 80.0000 mg | ORAL_TABLET | Freq: Two times a day (BID) | ORAL | 0 refills | Status: AC

## 2016-09-05 NOTE — Progress Notes (Signed)
Admit: 09/02/2016 LOS: 2  24F with refractory ascites and CKD3  Subjective:  Several unmeasured voids yesterday, including 0.5 L urine output Feels great, seems engaged about dietary Na GI considering TIPS down the road  07/21 0701 - 07/22 0700 In: 840 [P.O.:840] Out: 500 [Urine:500]  Filed Weights   09/03/16 0500 09/03/16 0525 09/05/16 0550  Weight: 83.8 kg (184 lb 11.9 oz) 83.8 kg (184 lb 11.9 oz) 77.8 kg (171 lb 8.3 oz)    Scheduled Meds: . amiodarone  200 mg Oral Daily  . famotidine  20 mg Oral Daily  . feeding supplement (GLUCERNA SHAKE)  237 mL Oral BID BM  . furosemide  80 mg Oral BID  . insulin aspart  0-15 Units Subcutaneous TID WC  . lactulose  20 g Oral TID  . propranolol  20 mg Oral BID  . sodium chloride flush  3 mL Intravenous Q12H   Continuous Infusions: . sodium chloride     PRN Meds:.acetaminophen **OR** acetaminophen, diphenhydrAMINE, ondansetron **OR** ondansetron (ZOFRAN) IV, oxyCODONE  Current Labs: reviewed    Physical Exam:  Blood pressure 98/64, pulse (!) 123, temperature 98.2 F (36.8 C), temperature source Oral, resp. rate 16, height 5\' 3"  (1.6 m), weight 77.8 kg (171 lb 8.3 oz), SpO2 97 %. NAD Abd soft, nd, +BS CTAB RRR nl s1s2 No LEE Nonfocal, AAOx3 Pleasan t affect EOMI NCAT  A 1. CKD3 BL SCr labile around 2 2. Cirrhosis, refractory paracentesis dependent ascites 3. DM2  P 1. Give K at upper limit of normal will continue with furosemide 80mg  PO BID and when K improves will add on aldactone: would do 200 aldactone and 80mg  lasix daily and goal dosing-- I will f/u as outpatient regarding this in the coming week 2. Pt rec dietitian education; cont Na and fluid restriciton 3. Will arrange close f/u with me in office as well as labs early next week; ok for discharge   Pearson Grippe MD 09/05/2016, 10:12 AM   Recent Labs Lab 09/02/16 1740 09/03/16 0434 09/04/16 0428  NA 134* 139 137  K 5.5* 5.1 5.1  CL 106 109 110  CO2 19* 22  19*  GLUCOSE 130* 82 99  BUN 57* 55* 42*  CREATININE 2.48* 2.37* 1.82*  CALCIUM 9.3 9.2 8.7*    Recent Labs Lab 09/02/16 1740 09/03/16 0434  WBC 8.0 6.9  NEUTROABS 5.7  --   HGB 12.9 12.1  HCT 37.9 37.3  MCV 95.2 94.4  PLT 224 216

## 2016-09-05 NOTE — Progress Notes (Signed)
[name] to be D/C'd home per MD order.  Discussed with the patient and all questions fully answered.  VSS, Skin clean, dry and intact without evidence of skin break down, no evidence of skin tears noted. IV catheter discontinued intact. Site without signs and symptoms of complications. Dressing and pressure applied.  An After Visit Summary was printed and given to the patient. Patient received prescription. Pt stated her medications were lost between Emergency Department and being admitted. She was given a card with the assistant Directors phone number and instructed to go home and search her house to see if her medications had been taken home, and to call the director tomorrow on Monday  D/c education completed with patient/family including follow up instructions, medication list, d/c activities limitations if indicated, with other d/c instructions as indicated by MD - patient able to verbalize understanding, all questions fully answered.   Patient instructed to return to ED, call 911, or call MD for any changes in condition.   Patient escorted via [place], and D/C home via private auto.  Milas Hock 09/05/2016 2:47 PM

## 2016-09-05 NOTE — Progress Notes (Signed)
Subjective: The patient was seen and examined at bedside. Post paracentesis she states that her clothes fit a little better. She was given increased dosage of lactulose, and reports 3 bowel movements so far. She has had 6 episodes of urination in the morning, urine output is not being measured currently. She denies abdominal pain, states she feels comfortable.  Objective: Vital signs in last 24 hours: Temp:  [98.2 F (36.8 C)] 98.2 F (36.8 C) (07/22 0550) Pulse Rate:  [86-123] 123 (07/22 0906) Resp:  [16] 16 (07/22 0550) BP: (83-98)/(47-64) 98/64 (07/22 0906) SpO2:  [93 %-100 %] 97 % (07/22 0550) Weight:  [77.8 kg (171 lb 8.3 oz)] 77.8 kg (171 lb 8.3 oz) (07/22 0550) Weight change:  Last BM Date: 09/04/16  PE: Not in acute distress GENERAL: No obvious pallor, no icterus ABDOMEN: Soft but remains distended, not tense, normoactive bowel sounds, presence of shifting dullness, no fluid thrill EXTREMITIES: No peripheral edema  Lab Results: Results for orders placed or performed during the hospital encounter of 09/02/16 (from the past 48 hour(s))  Body fluid cell count with differential     Status: Abnormal   Collection Time: 09/03/16 11:18 AM  Result Value Ref Range   Fluid Type-FCT Peritoneal    Color, Fluid STRAW (A) YELLOW   Appearance, Fluid TURBID (A) CLEAR   WBC, Fluid 134 0 - 1,000 cu mm   Neutrophil Count, Fluid 5 0 - 25 %   Lymphs, Fluid 39 %   Monocyte-Macrophage-Serous Fluid 56 50 - 90 %   Other Cells, Fluid MESOTHELIAL %  Gram stain     Status: None   Collection Time: 09/03/16 11:18 AM  Result Value Ref Range   Specimen Description PERITONEAL    Special Requests NONE    Gram Stain      RARE WBC PRESENT, PREDOMINANTLY MONONUCLEAR NO ORGANISMS SEEN    Report Status 09/03/2016 FINAL   Glucose, pleural or peritoneal fluid     Status: None   Collection Time: 09/03/16 11:18 AM  Result Value Ref Range   Glucose, Fluid 100 mg/dL    Comment: (NOTE) No normal range  established for this test Results should be evaluated in conjunction with serum values    Fluid Type-FGLU Peritoneal   Culture, body fluid-bottle     Status: None (Preliminary result)   Collection Time: 09/03/16 11:18 AM  Result Value Ref Range   Specimen Description PERITONEAL    Special Requests NONE    Culture NO GROWTH 1 DAY    Report Status PENDING   Glucose, capillary     Status: None   Collection Time: 09/03/16 12:18 PM  Result Value Ref Range   Glucose-Capillary 77 65 - 99 mg/dL  Glucose, capillary     Status: Abnormal   Collection Time: 09/03/16  4:25 PM  Result Value Ref Range   Glucose-Capillary 243 (H) 65 - 99 mg/dL  Glucose, capillary     Status: Abnormal   Collection Time: 09/03/16  8:36 PM  Result Value Ref Range   Glucose-Capillary 177 (H) 65 - 99 mg/dL  Glucose, capillary     Status: Abnormal   Collection Time: 09/04/16 12:56 AM  Result Value Ref Range   Glucose-Capillary 64 (L) 65 - 99 mg/dL  Glucose, capillary     Status: None   Collection Time: 09/04/16  1:32 AM  Result Value Ref Range   Glucose-Capillary 91 65 - 99 mg/dL  Basic metabolic panel     Status: Abnormal   Collection  Time: 09/04/16  4:28 AM  Result Value Ref Range   Sodium 137 135 - 145 mmol/L   Potassium 5.1 3.5 - 5.1 mmol/L   Chloride 110 101 - 111 mmol/L   CO2 19 (L) 22 - 32 mmol/L   Glucose, Bld 99 65 - 99 mg/dL   BUN 42 (H) 6 - 20 mg/dL   Creatinine, Ser 1.82 (H) 0.44 - 1.00 mg/dL   Calcium 8.7 (L) 8.9 - 10.3 mg/dL   GFR calc non Af Amer 27 (L) >60 mL/min   GFR calc Af Amer 32 (L) >60 mL/min    Comment: (NOTE) The eGFR has been calculated using the CKD EPI equation. This calculation has not been validated in all clinical situations. eGFR's persistently <60 mL/min signify possible Chronic Kidney Disease.    Anion gap 8 5 - 15  Glucose, capillary     Status: None   Collection Time: 09/04/16  4:33 AM  Result Value Ref Range   Glucose-Capillary 92 65 - 99 mg/dL  Glucose, capillary      Status: Abnormal   Collection Time: 09/04/16  8:04 AM  Result Value Ref Range   Glucose-Capillary 102 (H) 65 - 99 mg/dL  Glucose, capillary     Status: Abnormal   Collection Time: 09/04/16 12:32 PM  Result Value Ref Range   Glucose-Capillary 177 (H) 65 - 99 mg/dL  Glucose, capillary     Status: Abnormal   Collection Time: 09/04/16  5:42 PM  Result Value Ref Range   Glucose-Capillary 223 (H) 65 - 99 mg/dL  Glucose, capillary     Status: Abnormal   Collection Time: 09/04/16  9:43 PM  Result Value Ref Range   Glucose-Capillary 200 (H) 65 - 99 mg/dL  Glucose, capillary     Status: Abnormal   Collection Time: 09/05/16  8:10 AM  Result Value Ref Range   Glucose-Capillary 155 (H) 65 - 99 mg/dL    Studies/Results: Ir Paracentesis  Result Date: 09/03/2016 INDICATION: Patient with history of ascites and dyspnea. Request is made for diagnostic and therapeutic paracentesis. EXAM: ULTRASOUND GUIDED DIAGNOSTIC AND THERAPEUTIC PARACENTESIS MEDICATIONS: 10 mL 1% lidocaine COMPLICATIONS: None immediate. PROCEDURE: Informed written consent was obtained from the patient after a discussion of the risks, benefits and alternatives to treatment. A timeout was performed prior to the initiation of the procedure. Initial ultrasound scanning demonstrates a large amount of ascites within the right lateral abdomen. The right lateral abdomen was prepped and draped in the usual sterile fashion. 1% lidocaine was used for local anesthesia. Following this, a 19 gauge, 7-cm, Yueh catheter was introduced. An ultrasound image was saved for documentation purposes. The paracentesis was performed. The catheter was removed and a dressing was applied. The patient tolerated the procedure well without immediate post procedural complication. FINDINGS: A total of approximately 9.0 liters of chylous fluid was removed. Samples were sent to the laboratory as requested by the clinical team. IMPRESSION: Successful ultrasound-guided  diagnostic and therapeutic paracentesis yielding 9.0 liters of peritoneal fluid. Read by:  Brynda Greathouse PA-C Electronically Signed   By: Jacqulynn Cadet M.D.   On: 09/03/2016 11:37    Medications: I have reviewed the patient's current medications.  Assessment: 1. Decompensated cirrhosis 2. Recurrent ascites 3. Renal impairment 4. History of hepatic encephalopathy 5. History of esophageal varices  Plan: 1. Currently on Lasix 80 mg a day, labs not available today. Spoke with the nephrologist, plan is to do repeat labs as an outpatient, and if potassium is within normal  limits start the patient on spironolactone 200 mg daily as tolerated. Recommend low-salt/low Na diet. 2. Patient may be a candidate for TIPS, willl discuss with Dr. Michail Sermon who is her gastroenterologist as an outpatient. 3. Continue lactulose 20 g 3 times a day, patient needs to have 3-5 bowel movements. 4. Continue propanolol 20 mg twice a day.  Patient seems stable for discharge. Follow-up with Dr. Michail Sermon in 7-10 days.   Ronnette Juniper 09/05/2016, 10:17 AM   Pager [phone] If no answer or after 5 PM call [phone]

## 2016-09-05 NOTE — Discharge Summary (Signed)
Physician Discharge Summary  Natalie Lane VFI:[id number] DOB: 14-Jul-1945 DOA: 09/02/2016  PCP: Natalie Ada, MD  Admit date: 09/02/2016 Discharge date: 09/05/2016  Time spent: > 35 minutes  Recommendations for Outpatient Follow-up:  1. Please ensure patient has follow-up with nephrologist for adjustment of diuretic medication regimen to prevent reaccumulation of ascites which has landed patient in the hospital several times 2. Also ensure patient has follow-up with primary care physician for assessment of diabetes and further medical management   Discharge Diagnoses:  Principal Problem:   Cirrhosis of liver with ascites ([place]) Active Problems:   Atrial flutter ([facility])   Poorly controlled type 2 diabetes mellitus with peripheral neuropathy ([facility])   Acute kidney injury ([place])   Hyperkalemia   Hypotension   Ascites of liver   SOB (shortness of breath)   Discharge Condition: stable  Diet recommendation: Carb modified diet/low sodium diet  Filed Weights   09/03/16 0500 09/03/16 0525 09/05/16 0550  Weight: 83.8 kg (184 lb 11.9 oz) 83.8 kg (184 lb 11.9 oz) 77.8 kg (171 lb 8.3 oz)    History of present illness:  71 y.o.femalewith medical history significant for cryptogenic cirrhosis with ascites, paroxysmal atrial flutter, and insulin-dependent diabetes mellitus, now presenting to the emergency department for evaluation of progressive abdominal distention, now causing shortness of breath and pain. Patient had 6 L of ascitic fluid removed by paracentesis on 08/24/2016 and reports doing fairly well for several days after that before her abdomen began to distend again.  Patient presented again to the hospital with abdominal distention and subsequently had therapeutic paracentesis which yielded 9 L. I consulted patient's GI specialist who evaluated patient and recommended nephrology consult. I subsequently consulted nephrology as well for assistance with diuretic regimen.  Hospital  Course:  Cirrhosis of liver with ascites [facility] ) - Nephrology assisting with diuresis regimen. And have recommended Lasix 80 mg by mouth daily although plan will be to reassess his outpatient so that nephrology can consider placing on Lasix and spironolactone. Please refer to nephrology note for details - Abdominal discomfort much improved after paracentesis. - Gi consulted and recommended low-salt diet and agreed with nephrology diuretic recommendations.  Active Problems:   Atrial flutter (H[facility]) - Stable on propranolol but has been difficult to administer secondary to hypotension - Continue amiodarone -Not anticoagulated given cirrhosis with varices     Acute kidney injury [facility]) - Nephrology consulted and serum creatinine currently trending down. Patient will have outpatient evaluation by nephrology and they can reassess serum creatinine at that point    Hyperkalemia - Resolved  DM - Continue diabetic diet prior to admission medication regimen. Of note discontinued metformin given chronic kidney disease and serum creatinine of more than 1.4    Hypotension - Patient has a wrist cuff. I recommended she monitor her blood pressures and hold her propranolol should systolic blood pressure be less than 100 or heart rate less than 60    Ascites of liver   SOB (shortness of breath) - Resolved after paracentesis most likely secondary to increase intra-abdominal pressure from ascites   Procedures:  paracentesis  Consultations:  Nephrology  GI  Discharge Exam: Vitals:   09/05/16 0550 09/05/16 0906  BP: (!) 92/54 98/64  Pulse: (!) 111 (!) 123  Resp: 16   Temp: 98.2 F (36.8 C)     General: Pt in nad, alert and awake Cardiovascular: rrr, no rubs Respiratory: no increased wob, no wheezes  Discharge Instructions   Discharge Instructions    Call MD for:  severe uncontrolled pain    Complete by:  As directed    Call MD for:  temperature >100.4    Complete by:  As  directed    Diet - low sodium heart healthy    Complete by:  As directed    Discharge instructions    Complete by:  As directed    Please follow-up closely with nephrologist Dr. Joelyn Oms after discharge. Please call their office to ensure follow-up date and time. If systolic blood pressure under 100 or heart rate 60 or less please hold propranolol. Also follow-up with your primary care physician for reassessment of your blood sugars. I have held her metformin on discharge secondary to elevated serum creatinine.   Increase activity slowly    Complete by:  As directed      Current Discharge Medication List    CONTINUE these medications which have CHANGED   Details  furosemide (LASIX) 80 MG tablet Take 1 tablet (80 mg total) by mouth 2 (two) times daily. Qty: 60 tablet, Refills: 0    lactulose (CHRONULAC) 10 GM/15ML solution Take 30 mLs (20 g total) by mouth 3 (three) times daily. Qty: 240 mL, Refills: 0      CONTINUE these medications which have NOT CHANGED   Details  glimepiride (AMARYL) 2 MG tablet Take 1 tablet (2 mg total) by mouth 2 (two) times daily before a meal. Qty: 180 tablet, Refills: 3    insulin NPH Human (NOVOLIN N) 100 UNIT/ML injection Inject 20 units twice daily before meals. Qty: 10 mL, Refills: 4    lactose free nutrition (BOOST) LIQD Take 237 mLs by mouth daily.    ondansetron (ZOFRAN-ODT) 4 MG disintegrating tablet Take 4 mg by mouth daily as needed for nausea.    propranolol (INDERAL) 20 MG tablet Take 20 mg by mouth 2 (two) times daily.    ranitidine (ZANTAC) 150 MG capsule Take 150 mg by mouth daily as needed for heartburn.    Simethicone (GAS-X PO) Take 1 tablet by mouth daily as needed (gas).    amiodarone (PACERONE) 200 MG tablet Take 1 tablet (200 mg total) by mouth daily. Qty: 30 tablet, Refills: 11      STOP taking these medications     diphenhydrAMINE (BENADRYL) 25 MG tablet      metFORMIN (GLUCOPHAGE-XR) 500 MG 24 hr tablet       spironolactone (ALDACTONE) 100 MG tablet      metoprolol tartrate (LOPRESSOR) 25 MG tablet        Allergies  Allergen Reactions  . Monosodium Glutamate Anaphylaxis    MSG  . Red Dye Anaphylaxis  . Erythromycin Other (See Comments)    Reaction:  Migraine   . Penicillins Other (See Comments)    Reaction:  Migraine  Has patient had a PCN reaction causing immediate rash, facial/tongue/throat swelling, SOB or lightheadedness with hypotension: No Has patient had a PCN reaction causing severe rash involving mucus membranes or skin necrosis: No Has patient had a PCN reaction that required hospitalization No Has patient had a PCN reaction occurring within the last 10 years: No If all of the above answers are "NO", then may proceed with Cephalosporin use.  . Statins Other (See Comments)    Reaction:  Severe leg cramps   . Sulfa Antibiotics Hives  . Glipizide Rash  . Iohexol Rash  . Spironolactone Palpitations and Other (See Comments)    Sweating  . Tape Rash    "caused blisters"   Follow-up Information  Rexene Agent, MD. Go to.   Specialty:  Nephrology Why:  We will call with instruction about a follow up appointment with Dr. Joelyn Oms and labs.   Contact information: [place] [id number] [phone]            The results of significant diagnostics from this hospitalization (including imaging, microbiology, ancillary and laboratory) are listed below for reference.    Significant Diagnostic Studies: Dg Chest 2 View  Result Date: 09/02/2016 CLINICAL DATA:  Shortness of breath. EXAM: CHEST  2 VIEW COMPARISON:  08/23/2016 FINDINGS: Low lung volumes. Bibasilar atelectasis/scarring again noted. Cardiopericardial silhouette is at upper limits of normal for size. The visualized bony structures of the thorax are intact. Telemetry leads overlie the chest. IMPRESSION: Stable exam.  Basilar chronic atelectasis or scarring. Electronically Signed   By: Misty Stanley  M.D.   On: 09/02/2016 18:32   Dg Chest 2 View  Result Date: 08/23/2016 CLINICAL DATA:  Shortness of breath and left-sided chest pain EXAM: CHEST  2 VIEW COMPARISON:  06/25/2016 FINDINGS: Low volume chest with bandlike opacities at the bases. No edema, air bronchogram, effusion, or pneumothorax. Normal heart size and stable mediastinal contours. Previous median sternotomy with chronic fracturing of the lower sternal wire. IMPRESSION: 1. No acute finding.  Stable compared to 06/25/2016. 2. Atelectasis or scarring at the bases. Electronically Signed   By: Monte Fantasia M.D.   On: 08/23/2016 16:48   US Renal  Result Date: 09/03/2016 CLINICAL DATA:  Acute renal insufficiency. History of cirrhosis and diabetes mellitus. EXAM: RENAL / URINARY TRACT ULTRASOUND COMPLETE COMPARISON:  None. FINDINGS: Right Kidney: Length: 11.3 cm. Echogenicity within normal limits. No mass or hydronephrosis visualized. Left Kidney: Length: 10.5 cm. Echogenicity within normal limits. No mass or hydronephrosis visualized. Bladder: Limited visualization. Appears normal for degree of bladder distention. Large amount abdominal ascites present. IMPRESSION: Normal appearance of the kidneys. Large volume abdominal ascites. Electronically Signed   By: Fidela Salisbury M.D.   On: 09/03/2016 08:18   Ir Paracentesis  Result Date: 09/03/2016 INDICATION: Patient with history of ascites and dyspnea. Request is made for diagnostic and therapeutic paracentesis. EXAM: ULTRASOUND GUIDED DIAGNOSTIC AND THERAPEUTIC PARACENTESIS MEDICATIONS: 10 mL 1% lidocaine COMPLICATIONS: None immediate. PROCEDURE: Informed written consent was obtained from the patient after a discussion of the risks, benefits and alternatives to treatment. A timeout was performed prior to the initiation of the procedure. Initial ultrasound scanning demonstrates a large amount of ascites within the right lateral abdomen. The right lateral abdomen was prepped and draped in the  usual sterile fashion. 1% lidocaine was used for local anesthesia. Following this, a 19 gauge, 7-cm, Yueh catheter was introduced. An ultrasound image was saved for documentation purposes. The paracentesis was performed. The catheter was removed and a dressing was applied. The patient tolerated the procedure well without immediate post procedural complication. FINDINGS: A total of approximately 9.0 liters of chylous fluid was removed. Samples were sent to the laboratory as requested by the clinical team. IMPRESSION: Successful ultrasound-guided diagnostic and therapeutic paracentesis yielding 9.0 liters of peritoneal fluid. Read by:  Brynda Greathouse PA-C Electronically Signed   By: Jacqulynn Cadet M.D.   On: 09/03/2016 11:37   Ir Paracentesis  Result Date: 08/24/2016 INDICATION: Patient with history of ascites. Request is made for therapeutic paracentesis. Patient to receive albumin postprocedure. EXAM: ULTRASOUND GUIDED THERAPEUTIC PARACENTESIS MEDICATIONS: 10 mL 1% lidocaine COMPLICATIONS: None immediate. PROCEDURE: Informed written consent was obtained from the patient after a discussion of  the risks, benefits and alternatives to treatment. A timeout was performed prior to the initiation of the procedure. Initial ultrasound scanning demonstrates a large amount of ascites within the right lateral abdomen. The right lateral abdomen was prepped and draped in the usual sterile fashion. 1% lidocaine was used for local anesthesia. Following this, a 19 gauge, 7-cm, Yueh catheter was introduced. An ultrasound image was saved for documentation purposes. The paracentesis was performed. The catheter was removed and a dressing was applied. The patient tolerated the procedure well without immediate post procedural complication. FINDINGS: A total of approximately 6.0 liters of chylous fluid was removed. IMPRESSION: Successful ultrasound-guided therapeutic paracentesis yielding 6.0 liters of peritoneal fluid. Read by:   Brynda Greathouse PA-C Electronically Signed   By: Aletta Edouard M.D.   On: 08/24/2016 13:12   Ir Paracentesis  Result Date: 08/09/2016 INDICATION: Patient with recurrent ascites. Request is made for therapeutic paracentesis of up to 6 L maximum. EXAM: ULTRASOUND GUIDED THERAPEUTIC PARACENTESIS MEDICATIONS: 10 mL 1% lidocaine COMPLICATIONS: None immediate. PROCEDURE: Informed written consent was obtained from the patient after a discussion of the risks, benefits and alternatives to treatment. A timeout was performed prior to the initiation of the procedure. Initial ultrasound scanning demonstrates a large amount of ascites within the right lateral abdomen. The right lateral abdomen was prepped and draped in the usual sterile fashion. 1% lidocaine was used for local anesthesia. Following this, a 19 gauge, 7-cm, Yueh catheter was introduced. An ultrasound image was saved for documentation purposes. The paracentesis was performed. The catheter was removed and a dressing was applied. The patient tolerated the procedure well without immediate post procedural complication. FINDINGS: A total of approximately 6.0 liters of chylous fluid was removed. IMPRESSION: Successful ultrasound-guided therapeutic paracentesis yielding 6.0 liters of peritoneal fluid. Read by:  Brynda Greathouse PA-C Electronically Signed   By: Sandi Mariscal M.D.   On: 08/09/2016 14:27    Microbiology: Recent Results (from the past 240 hour(s))  Gram stain     Status: None   Collection Time: 09/03/16 11:18 AM  Result Value Ref Range Status   Specimen Description PERITONEAL  Final   Special Requests NONE  Final   Gram Stain   Final    RARE WBC PRESENT, PREDOMINANTLY MONONUCLEAR NO ORGANISMS SEEN    Report Status 09/03/2016 FINAL  Final  Culture, body fluid-bottle     Status: None (Preliminary result)   Collection Time: 09/03/16 11:18 AM  Result Value Ref Range Status   Specimen Description PERITONEAL  Final   Special Requests NONE  Final    Culture NO GROWTH 2 DAYS  Final   Report Status PENDING  Incomplete     Labs: Basic Metabolic Panel:  Recent Labs Lab 09/02/16 1740 09/03/16 0434 09/04/16 0428  NA 134* 139 137  K 5.5* 5.1 5.1  CL 106 109 110  CO2 19* 22 19*  GLUCOSE 130* 82 99  BUN 57* 55* 42*  CREATININE 2.48* 2.37* 1.82*  CALCIUM 9.3 9.2 8.7*   Liver Function Tests:  Recent Labs Lab 09/02/16 1740 09/03/16 0434  AST 30 22  ALT 19 17  ALKPHOS 43 38  BILITOT 0.8 1.0  PROT 6.4* 5.9*  ALBUMIN 3.3* 2.8*   No results for input(s): LIPASE, AMYLASE in the last 168 hours. No results for input(s): AMMONIA in the last 168 hours. CBC:  Recent Labs Lab 09/02/16 1740 09/03/16 0434  WBC 8.0 6.9  NEUTROABS 5.7  --   HGB 12.9 12.1  HCT 37.9  37.3  MCV 95.2 94.4  PLT 224 216   Cardiac Enzymes: No results for input(s): CKTOTAL, CKMB, CKMBINDEX, TROPONINI in the last 168 hours. BNP: BNP (last 3 results)  Recent Labs  04/26/16 1308 05/10/16 1431 06/25/16 1658  BNP 96.5 114.7* 148.4*    ProBNP (last 3 results) No results for input(s): PROBNP in the last 8760 hours.  CBG:  Recent Labs Lab 09/04/16 0804 09/04/16 1232 09/04/16 1742 09/04/16 2143 09/05/16 0810  GLUCAP 102* 177* 223* 200* 155*    Signed:  Velvet Bathe MD.  [facility] 09/05/2016, 11:23 AM

## 2016-09-06 LAB — PATHOLOGIST SMEAR REVIEW

## 2016-09-07 ENCOUNTER — Telehealth: Payer: Self-pay | Admitting: Internal Medicine

## 2016-09-07 NOTE — Telephone Encounter (Signed)
Please see below.  Thank you.

## 2016-09-07 NOTE — Telephone Encounter (Signed)
Noted,  Ty

## 2016-09-07 NOTE — Telephone Encounter (Signed)
Patient was hospitalized and the doctor took her off of metformin. Patient wanted Dr. Cruzita Lederer to know.

## 2016-09-08 DIAGNOSIS — R531 Weakness: Secondary | ICD-10-CM | POA: Diagnosis not present

## 2016-09-08 LAB — CULTURE, BODY FLUID W GRAM STAIN -BOTTLE

## 2016-09-08 LAB — CULTURE, BODY FLUID-BOTTLE: CULTURE: NO GROWTH

## 2016-09-09 DIAGNOSIS — E1165 Type 2 diabetes mellitus with hyperglycemia: Secondary | ICD-10-CM | POA: Diagnosis not present

## 2016-09-09 DIAGNOSIS — R188 Other ascites: Secondary | ICD-10-CM | POA: Diagnosis not present

## 2016-09-09 DIAGNOSIS — K7460 Unspecified cirrhosis of liver: Secondary | ICD-10-CM | POA: Diagnosis not present

## 2016-09-09 DIAGNOSIS — Z7984 Long term (current) use of oral hypoglycemic drugs: Secondary | ICD-10-CM | POA: Diagnosis not present

## 2016-09-13 ENCOUNTER — Other Ambulatory Visit (HOSPITAL_COMMUNITY): Payer: Self-pay | Admitting: Gastroenterology

## 2016-09-13 DIAGNOSIS — R188 Other ascites: Secondary | ICD-10-CM

## 2016-09-13 DIAGNOSIS — K7469 Other cirrhosis of liver: Secondary | ICD-10-CM

## 2016-09-14 ENCOUNTER — Ambulatory Visit: Payer: Self-pay | Admitting: Internal Medicine

## 2016-09-14 DIAGNOSIS — R188 Other ascites: Secondary | ICD-10-CM | POA: Diagnosis not present

## 2016-09-14 DIAGNOSIS — K7460 Unspecified cirrhosis of liver: Secondary | ICD-10-CM | POA: Diagnosis not present

## 2016-09-14 DIAGNOSIS — N183 Chronic kidney disease, stage 3 (moderate): Secondary | ICD-10-CM | POA: Diagnosis not present

## 2016-09-15 DIAGNOSIS — R188 Other ascites: Secondary | ICD-10-CM | POA: Diagnosis not present

## 2016-09-15 DIAGNOSIS — K7460 Unspecified cirrhosis of liver: Secondary | ICD-10-CM | POA: Diagnosis not present

## 2016-09-16 ENCOUNTER — Other Ambulatory Visit (HOSPITAL_COMMUNITY): Payer: Self-pay | Admitting: Gastroenterology

## 2016-09-16 ENCOUNTER — Ambulatory Visit (HOSPITAL_COMMUNITY)
Admission: RE | Admit: 2016-09-16 | Discharge: 2016-09-16 | Disposition: A | Discharge status: Home / Self Care | Payer: Medicare Other | Source: Ambulatory Visit | Attending: Gastroenterology | Admitting: Gastroenterology

## 2016-09-16 ENCOUNTER — Encounter (HOSPITAL_COMMUNITY): Payer: Self-pay | Admitting: Student

## 2016-09-16 DIAGNOSIS — R188 Other ascites: Secondary | ICD-10-CM | POA: Diagnosis not present

## 2016-09-16 DIAGNOSIS — K7469 Other cirrhosis of liver: Secondary | ICD-10-CM

## 2016-09-16 HISTORY — PX: IR PARACENTESIS: IMG2679

## 2016-09-16 MED ORDER — ACETAMINOPHEN 325 MG PO TABS
650.0000 mg | ORAL_TABLET | Freq: Once | ORAL | Status: AC
  Administered 2016-09-16: 325 mg via ORAL
  Filled 2016-09-16: qty 2

## 2016-09-16 MED ORDER — ALBUMIN HUMAN 25 % IV SOLN
50.0000 g | Freq: Once | INTRAVENOUS | Status: AC
Start: 2016-09-16 — End: 2016-09-16
  Administered 2016-09-16: 50 g via INTRAVENOUS
  Filled 2016-09-16: qty 200

## 2016-09-16 MED ORDER — LIDOCAINE HCL 1 % IJ SOLN
INTRAMUSCULAR | Status: DC | PRN
  Administered 2016-09-16: 10 mL

## 2016-09-16 MED ORDER — ACETAMINOPHEN 325 MG PO TABS
ORAL_TABLET | ORAL | Status: AC
  Filled 2016-09-16: qty 2

## 2016-09-16 MED ORDER — LIDOCAINE HCL (PF) 1 % IJ SOLN
INTRAMUSCULAR | Status: AC
  Filled 2016-09-16: qty 30

## 2016-09-16 NOTE — Procedures (Signed)
PROCEDURE SUMMARY:  Successful US guided paracentesis from left lateral abdomen.  Yielded 3.2 liters of chylous fluid.  Pt tolerated well, however procedure was stopped after 3.2 liters due to change in blood pressure.  Patient was given 50g albumin and procedure was terminated prior to removal of all fluid.   Specimen was not sent for labs.  Docia Barrier PA-C 09/16/2016 3:34 PM

## 2016-09-20 DIAGNOSIS — R531 Weakness: Secondary | ICD-10-CM | POA: Diagnosis not present

## 2016-09-23 ENCOUNTER — Other Ambulatory Visit (HOSPITAL_COMMUNITY): Payer: Self-pay | Admitting: Gastroenterology

## 2016-09-23 ENCOUNTER — Ambulatory Visit (HOSPITAL_COMMUNITY)
Admission: RE | Admit: 2016-09-23 | Discharge: 2016-09-23 | Disposition: A | Discharge status: Home / Self Care | Payer: Medicare Other | Source: Ambulatory Visit | Attending: Gastroenterology | Admitting: Gastroenterology

## 2016-09-23 ENCOUNTER — Encounter (HOSPITAL_COMMUNITY): Payer: Self-pay | Admitting: Student

## 2016-09-23 DIAGNOSIS — K7460 Unspecified cirrhosis of liver: Secondary | ICD-10-CM | POA: Insufficient documentation

## 2016-09-23 DIAGNOSIS — R188 Other ascites: Secondary | ICD-10-CM | POA: Diagnosis not present

## 2016-09-23 HISTORY — PX: IR PARACENTESIS: IMG2679

## 2016-09-23 MED ORDER — LIDOCAINE HCL (PF) 1 % IJ SOLN
INTRAMUSCULAR | Status: AC
  Filled 2016-09-23: qty 30

## 2016-09-23 MED ORDER — LIDOCAINE HCL 1 % IJ SOLN
INTRAMUSCULAR | Status: DC | PRN
  Administered 2016-09-23: 10 mL

## 2016-09-23 MED ORDER — ALBUMIN HUMAN 25 % IV SOLN
50.0000 g | Freq: Once | INTRAVENOUS | Status: AC
  Administered 2016-09-23: 50 g via INTRAVENOUS
  Filled 2016-09-23: qty 200

## 2016-09-23 NOTE — Procedures (Signed)
PROCEDURE SUMMARY:  Successful US guided paracentesis from right lateral abdomen.  Yielded 9.0 liters of chylous fluid.  No immediate complications.  Pt tolerated well.   Specimen was not sent for labs.  Docia Barrier PA-C 09/23/2016 2:30 PM

## 2016-09-28 NOTE — Progress Notes (Signed)
CARDIOLOGY OFFICE NOTE  Date:  09/30/2016    Natalie Lane Date of Birth: 04/02/1945 Medical Record #[id number]  PCP:  Carol Ada, MD  Cardiologist:  Peter Martinique  MD  Chief Complaint  Patient presents with  . Atrial Flutter    History of Present Illness: Natalie Lane is a 71 y.o. female who presents today for follow up PAT and prior VSD repair  She has a remote history of atrial flutter s/p RF catheter ablation in 2011. [facility] She is s/p probable VSD repair as a child. She had a normal stress Echo in the past. Echo in 2013 showed no shunt and normal LV/RV function.  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 She is s/p banding. Due to her varices she was switched from metoprolol to propranolol.  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 She has recurrent ascites managed by Dr. Michail Sermon. She was admitted in March with respiratory failure, massive ascites. She was tachycardic with some PAT. She underwent large volume paracentesis with improvement. Continued on propranolol. Diuretics increased. Not a candidate for anticoagulation due to varices.   Admitted again in May 2018 with decompensated hepatic cirrhosis, ascites, and PAT. BP too low to titrate beta blocker or CCB. On metoprolol 25 mg daily. Started on amiodarone as last resort. Had repeat paracentesis.   She was admitted twice in July with increased ascites and worsening renal function. She had large volume paracentesis on both admissions. Seen by Nephrology and diuretics adjusted. She was maintaining NSR during those admissions. She had repeat EGD at [facility] which was benign but she broke 2 teeth during the procedure.   She notes her heart racing a lot 2-3 days ago. She has not been taking her amiodarone daily due to low BP. She is going to been seen in [place] at the end of this month for consideration of a liver transplant.    Past Medical  History:  Diagnosis Date  . Anemia   . Ascites   . CHD (congenital heart disease)    a. 1979 s/p septal defect repair (? VSD vs ASD), old op notes not available;  b. 04/2016 Echo: EF 60-65%, no rwma, Gr2 DD, mild AS, mild TR, no PFO (prev repaired).  . Chronic diastolic CHF (congestive heart failure) ([place])    04/2016 Echo: EF 60-65%, no rwma, Gr2 DD.  [facility] Complication of anesthesia   . Cryptogenic cirrhosis ([place])    a. 04/2016 s/p paracentesis.  . Diabetes mellitus, type 2 ([place])   . Dyspnea   . Edema   . Gastric varices    a. s/p banding.  [facility] GERD (gastroesophageal reflux disease)   . History of blood transfusion   . Hyperlipidemia   . Normal stress echocardiogram 2008  . Pancreatic adenoma   . Paroxysmal atrial flutter ([place])    a. 11/2009 s/p RFCA.  [facility]atalie Lane PONV (postoperative nausea and vomiting)    with Cholecystectomy  . Tobacco abuse    a. smokes 4 cigarettes/day.    Past Surgical History:  Procedure Laterality Date  . ABDOMINAL HYSTERECTOMY    . ASD versus VSD repair  1979   Operative notes not available.  . ATRIAL ABLATION SURGERY  10 /2011  . [place]   x2  . CHOLECYSTECTOMY    . ESOPHAGOGASTRODUODENOSCOPY (EGD) WITH PROPOFOL N/A 11/16/2013   Procedure: ESOPHAGOGASTRODUODENOSCOPY (EGD) WITH PROPOFOL;  Surgeon: Loanne Drilling.  Michail Sermon, MD;  Location: [facility];  Service: Endoscopy;  Laterality: N/A;  . ESOPHAGOGASTRODUODENOSCOPY (EGD) WITH PROPOFOL N/A 01/23/2014   Procedure: ESOPHAGOGASTRODUODENOSCOPY (EGD) WITH PROPOFOL;  Surgeon: Lear Ng, MD;  Location: [facility];  Service: Endoscopy;  Laterality: N/A;  . GASTRIC VARICES BANDING N/A 11/16/2013   Procedure: GASTRIC VARICES BANDING;  Surgeon: Lear Ng, MD;  Location: [facility];  Service: Endoscopy;  Laterality: N/A;  . GASTRIC VARICES BANDING N/A 01/23/2014   Procedure: GASTRIC VARICES BANDING;  Surgeon: Lear Ng, MD;  Location: [facility];  Service: Endoscopy;  Laterality: N/A;    . HERNIA REPAIR    . IR GENERIC HISTORICAL  05/11/2016   IR PARACENTESIS 05/11/2016 [facility] Dike, PA-C [facility]  . IR PARACENTESIS  07/08/2016  . IR PARACENTESIS  08/09/2016  . IR PARACENTESIS  08/24/2016  . IR PARACENTESIS  09/03/2016  . IR PARACENTESIS  09/16/2016  . IR PARACENTESIS  09/23/2016  . OTHER SURGICAL HISTORY     partial hysterectomy  . PARACENTESIS  09/03/2016    Allergies as of 09/30/2016      Reactions   Monosodium Glutamate Anaphylaxis   MSG   Red Dye Anaphylaxis   Erythromycin Other (See Comments)   Reaction:  Migraine    Penicillins Other (See Comments)   Reaction:  Migraine  Has patient had a PCN reaction causing immediate rash, facial/tongue/throat swelling, SOB or lightheadedness with hypotension: No Has patient had a PCN reaction causing severe rash involving mucus membranes or skin necrosis: No Has patient had a PCN reaction that required hospitalization No Has patient had a PCN reaction occurring within the last 10 years: No If all of the above answers are "NO", then may proceed with Cephalosporin use.   Statins Other (See Comments)   Reaction:  Severe leg cramps    Sulfa Antibiotics Hives   Glipizide Rash   Iohexol Rash   Spironolactone Palpitations, Other (See Comments)   Sweating   Tape Rash   "caused blisters"      Medication List       Accurate as of 09/30/16  2:41 PM. Always use your most recent med list.          amiodarone 200 MG tablet Commonly known as:  PACERONE Take 1 tablet (200 mg total) by mouth daily.   furosemide 80 MG tablet Commonly known as:  LASIX Take 1 tablet (80 mg total) by mouth 2 (two) times daily.   GAS-X PO Take 1 tablet by mouth daily as needed (gas).   glimepiride 2 MG tablet Commonly known as:  AMARYL Take 1 tablet (2 mg total) by mouth 2 (two) times daily before a meal.   insulin NPH Human 100 UNIT/ML injection Commonly known as:  NOVOLIN N Inject 20 units twice daily before meals.   lactulose 10  GM/15ML solution Commonly known as:  CHRONULAC Take 30 mLs (20 g total) by mouth 3 (three) times daily.   ondansetron 4 MG disintegrating tablet Commonly known as:  ZOFRAN-ODT Take 4 mg by mouth daily as needed for nausea.   ranitidine 150 MG capsule Commonly known as:  ZANTAC Take 150 mg by mouth daily as needed for heartburn.   spironolactone 50 MG tablet Commonly known as:  ALDACTONE Take 1 tablet by mouth daily.       Allergies: Allergies  Allergen Reactions  . Monosodium Glutamate Anaphylaxis    MSG  . Red Dye Anaphylaxis  . Erythromycin Other (See Comments)    Reaction:  Migraine   . Penicillins Other (See Comments)    Reaction:  Migraine  Has patient had a PCN reaction causing immediate rash, facial/tongue/throat swelling, SOB or lightheadedness with hypotension: No Has patient had a PCN reaction causing severe rash involving mucus membranes or skin necrosis: No Has patient had a PCN reaction that required hospitalization No Has patient had a PCN reaction occurring within the last 10 years: No If all of the above answers are "NO", then may proceed with Cephalosporin use.  . Statins Other (See Comments)    Reaction:  Severe leg cramps   . Sulfa Antibiotics Hives  . Glipizide Rash  . Iohexol Rash  . Spironolactone Palpitations and Other (See Comments)    Sweating  . Tape Rash    "caused blisters"    Social History: The patient  reports that she quit smoking about 7 months ago. Her smoking use included Cigarettes. She has a 18.00 pack-year smoking history. She has never used smokeless tobacco. She reports that she does not drink alcohol or use drugs.   Family History: The patient's family history includes Depression in her paternal grandfather; Diabetes in her brother; Hypertension in her mother; Kidney disease in her sister.   Review of Systems: Please see the history of present illness.   Otherwise, the review of systems is positive for none.   All other  systems are reviewed and negative.   Physical Exam: VS:  BP (!) 88/61   Pulse 95   Ht 5\' 3"  (1.6 m)   Wt 178 lb 6.4 oz (80.9 kg)   BMI 31.60 kg/m  .  BMI Body mass index is 31.6 kg/m.  Wt Readings from Last 3 Encounters:  09/30/16 178 lb 6.4 oz (80.9 kg)  09/05/16 171 lb 8.3 oz (77.8 kg)  08/25/16 180 lb 4.8 oz (81.8 kg)    General: Pleasant. Well developed, well nourished and in no acute distress.   HEENT: Normal.  Neck: Supple, no JVD, carotid bruits, or masses noted.  Cardiac: Regular rate and rhythm. 2/6 outflow murmur noted.  No edema.  Respiratory:  Lungs are clear to auscultation bilaterally with normal work of breathing.  GI: Soft with moderate amount of ascites. soft MS: No deformity or atrophy. Gait and ROM intact.  Skin: Warm and dry. Color is normal.  Neuro:  Strength and sensation are intact and no gross focal deficits noted.  Psych: Alert, appropriate and with normal affect.   LABORATORY DATA:  EKG:  EKG is not ordered today.  Lab Results  Component Value Date   WBC 6.9 09/03/2016   HGB 12.1 09/03/2016   HCT 37.3 09/03/2016   PLT 216 09/03/2016   GLUCOSE 99 09/04/2016   CHOL 287 (A) 02/28/2014   TRIG 148 02/28/2014   HDL 62 02/28/2014   LDLCALC 196 02/28/2014   ALT 17 09/03/2016   AST 22 09/03/2016   NA 137 09/04/2016   K 5.1 09/04/2016   CL 110 09/04/2016   CREATININE 1.82 (H) 09/04/2016   BUN 42 (H) 09/04/2016   CO2 19 (L) 09/04/2016   TSH 1.799 06/29/2016   INR 1.28 09/02/2016   HGBA1C 6.7 05/31/2016   MICROALBUR see comment  05/24/2016    BNP (last 3 results)  Recent Labs  04/26/16 1308 05/10/16 1431 06/25/16 1658  BNP 96.5 114.7* 148.4*    ProBNP (last 3 results) No results for input(s): PROBNP in the last 8760 hours.   Other Studies Reviewed Today:  Echo Study Conclusions 05/12/16: Study Conclusions  -  Left ventricle: The cavity size was normal. Wall thickness was   increased in a pattern of mild LVH. Systolic function  was normal.   The estimated ejection fraction was in the range of 60% to 65%.   Wall motion was normal; there were no regional wall motion   abnormalities. Doppler parameters are consistent with   pseudonormal left ventricular relaxation (grade 2 diastolic   dysfunction). The E/e&' ratio is >15, suggesting elevated LV   filling pressure. - Ventricular septum: No residual VSD. - Aortic valve: Trileaflet; mildly calcified leaflets. Mild   stenosis. Mean gradient (S): 10 mm Hg. Peak gradient (S): 18 mm   Hg. Valve area (VTI): 1.12 cm^2. Valve area (Vmax): 1.21 cm^2.   Valve area (Vmean): 1.05 cm^2. - Mitral valve: Calcified annulus. Mildly thickened leaflets .   Valve area by pressure half-time: 2.22 cm^2. Valve area by   continuity equation (using LVOT flow): 1.15 cm^2. - Left atrium: The atrium was at the upper limits of normal in   size. - Right atrium: The atrium was at the upper limits of normal in   size. - Atrial septum: No defect or patent foramen ovale was identified   (previously repaired). - Tricuspid valve: There was mild regurgitation. - Pulmonary arteries: PA peak pressure: 23 mm Hg (S). - Inferior vena cava: The vessel was normal in size. The   respirophasic diameter changes were in the normal range (>= 50%),   consistent with normal central venous pressure.  Impressions:  - Compared to a prior study in 2017, there are no significant   changes.  Assessment/Plan: 1. Remote VSD repair. No shunt by last Echo  2. History of atrial flutter/PAT. More sustained arrhythmia due to ascites and respiratory failure. Unable to tolerate AV nodal agents due to hypotension. Now on amiodarone. Not ideal in setting of cirrhosis but our only option. I have recommended she take it daily since it should not affect her BP.  She is not a candidate for anticoagulation due to cirrhosis and gastric varices.   3. Cirrhosis with gastric varices and recurrent ascites. S/p large volume  paracentesis. Awaiting evaluation for possible liver transplant.   4. Pancreatic ampulla and duodenal adenoma.   5. DM  Current medicines are reviewed with the patient today.  The patient does not have concerns regarding medicines other than what has been noted above.  The following changes have been made:  See above.  Labs/ tests ordered today include:    No orders of the defined types were placed in this encounter.    Signed: Peter Martinique MD, [facility]  09/30/2016 2:41 PM  [place]

## 2016-09-30 ENCOUNTER — Encounter: Payer: Self-pay | Admitting: Cardiology

## 2016-09-30 ENCOUNTER — Ambulatory Visit (INDEPENDENT_AMBULATORY_CARE_PROVIDER_SITE_OTHER): Payer: Medicare Other | Admitting: Cardiology

## 2016-09-30 VITALS — BP 88/61 | HR 95 | Ht 63.0 in | Wt 178.4 lb

## 2016-09-30 DIAGNOSIS — Z8774 Personal history of (corrected) congenital malformations of heart and circulatory system: Secondary | ICD-10-CM | POA: Diagnosis not present

## 2016-09-30 DIAGNOSIS — K7460 Unspecified cirrhosis of liver: Secondary | ICD-10-CM | POA: Diagnosis not present

## 2016-09-30 DIAGNOSIS — Z9889 Other specified postprocedural states: Secondary | ICD-10-CM

## 2016-09-30 DIAGNOSIS — I483 Typical atrial flutter: Secondary | ICD-10-CM | POA: Diagnosis not present

## 2016-09-30 DIAGNOSIS — R188 Other ascites: Secondary | ICD-10-CM

## 2016-09-30 NOTE — Patient Instructions (Addendum)
Take the amiodarone daily  Continue your other therapy  I will see you in 3 months

## 2016-10-01 ENCOUNTER — Other Ambulatory Visit (HOSPITAL_COMMUNITY): Payer: Self-pay | Admitting: Gastroenterology

## 2016-10-01 DIAGNOSIS — R188 Other ascites: Secondary | ICD-10-CM

## 2016-10-04 ENCOUNTER — Telehealth: Payer: Self-pay | Admitting: Internal Medicine

## 2016-10-04 ENCOUNTER — Other Ambulatory Visit: Payer: Self-pay

## 2016-10-04 MED ORDER — GLUCOSE BLOOD VI STRP: ORAL_STRIP | 5 refills | Status: DC

## 2016-10-04 NOTE — Telephone Encounter (Signed)
MEDICATION: ONE TOUCH ULTRA TEST STRIPS  PHARMACY:  [place], [place]. [phone] (Phone) [phone] (Fax)   IS THIS A 90 DAY SUPPLY : yes  IS PATIENT OUT OF MEDICTAION: yes  IF NOT; HOW MUCH IS LEFT: n/a  LAST APPOINTMENT DATE:05/31/16   NEXT APPOINTMENT DATE:11/12/16   OTHER COMMENTS:    **Let patient know to contact pharmacy at the end of the day to make sure medication is ready. **  ** Please notify patient to allow 48-72 hours to process**  **Encourage patient to contact the pharmacy for refills or they can request refills through [facility]**

## 2016-10-04 NOTE — Telephone Encounter (Signed)
Submitted

## 2016-10-05 DIAGNOSIS — E875 Hyperkalemia: Secondary | ICD-10-CM | POA: Diagnosis not present

## 2016-10-07 ENCOUNTER — Ambulatory Visit (HOSPITAL_COMMUNITY)
Admission: RE | Admit: 2016-10-07 | Discharge: 2016-10-07 | Disposition: A | Discharge status: Home / Self Care | Payer: Medicare Other | Source: Ambulatory Visit | Attending: Gastroenterology | Admitting: Gastroenterology

## 2016-10-07 ENCOUNTER — Encounter (HOSPITAL_COMMUNITY): Payer: Self-pay | Admitting: General Surgery

## 2016-10-07 DIAGNOSIS — R188 Other ascites: Secondary | ICD-10-CM | POA: Diagnosis not present

## 2016-10-07 DIAGNOSIS — K7460 Unspecified cirrhosis of liver: Secondary | ICD-10-CM | POA: Insufficient documentation

## 2016-10-07 DIAGNOSIS — K760 Fatty (change of) liver, not elsewhere classified: Secondary | ICD-10-CM | POA: Diagnosis not present

## 2016-10-07 HISTORY — PX: IR PARACENTESIS: IMG2679

## 2016-10-07 MED ORDER — LIDOCAINE HCL 1 % IJ SOLN
INTRAMUSCULAR | Status: DC | PRN
  Administered 2016-10-07: 10 mL

## 2016-10-07 MED ORDER — LIDOCAINE HCL (PF) 1 % IJ SOLN
INTRAMUSCULAR | Status: AC
  Filled 2016-10-07: qty 30

## 2016-10-07 MED ORDER — ALBUMIN HUMAN 25 % IV SOLN
50.0000 g | Freq: Once | INTRAVENOUS | Status: AC
  Administered 2016-10-07: 50 g via INTRAVENOUS
  Filled 2016-10-07: qty 200

## 2016-10-07 NOTE — Procedures (Signed)
Ultrasound-guided therapeutic paracentesis performed yielding 9 liters of chylous colored fluid. No immediate complications.  Natalie Lane E 12:33 PM 10/07/2016

## 2016-10-13 ENCOUNTER — Other Ambulatory Visit (HOSPITAL_COMMUNITY): Payer: Self-pay | Admitting: Gastroenterology

## 2016-10-13 DIAGNOSIS — R188 Other ascites: Principal | ICD-10-CM

## 2016-10-13 DIAGNOSIS — K7460 Unspecified cirrhosis of liver: Secondary | ICD-10-CM

## 2016-10-14 ENCOUNTER — Ambulatory Visit (HOSPITAL_COMMUNITY)
Admission: RE | Admit: 2016-10-14 | Discharge: 2016-10-14 | Disposition: A | Discharge status: Home / Self Care | Payer: Medicare Other | Source: Ambulatory Visit | Attending: Gastroenterology | Admitting: Gastroenterology

## 2016-10-14 ENCOUNTER — Encounter (HOSPITAL_COMMUNITY): Payer: Self-pay | Admitting: Student

## 2016-10-14 DIAGNOSIS — R188 Other ascites: Secondary | ICD-10-CM | POA: Diagnosis not present

## 2016-10-14 DIAGNOSIS — K7460 Unspecified cirrhosis of liver: Secondary | ICD-10-CM

## 2016-10-14 HISTORY — PX: IR PARACENTESIS: IMG2679

## 2016-10-14 MED ORDER — ALBUMIN HUMAN 25 % IV SOLN
50.0000 g | Freq: Once | INTRAVENOUS | Status: AC
  Administered 2016-10-14: 50 g via INTRAVENOUS
  Filled 2016-10-14: qty 200

## 2016-10-14 MED ORDER — LIDOCAINE HCL (PF) 1 % IJ SOLN
INTRAMUSCULAR | Status: AC
  Filled 2016-10-14: qty 30

## 2016-10-14 NOTE — Procedures (Signed)
PROCEDURE SUMMARY:  Successful US guided paracentesis from right lateral abdomen.  Yielded 7.0 liters of chylous fluid.  No immediate complications.  Pt tolerated well.   Specimen was not sent for labs.  Docia Barrier PA-C 10/14/2016 4:42 PM

## 2016-10-15 DIAGNOSIS — K7460 Unspecified cirrhosis of liver: Secondary | ICD-10-CM | POA: Diagnosis not present

## 2016-10-15 DIAGNOSIS — Z01818 Encounter for other preprocedural examination: Secondary | ICD-10-CM | POA: Diagnosis not present

## 2016-10-21 DIAGNOSIS — R531 Weakness: Secondary | ICD-10-CM | POA: Diagnosis not present

## 2016-10-22 ENCOUNTER — Other Ambulatory Visit (HOSPITAL_COMMUNITY): Payer: Self-pay | Admitting: Gastroenterology

## 2016-10-22 DIAGNOSIS — R188 Other ascites: Principal | ICD-10-CM

## 2016-10-22 DIAGNOSIS — K7460 Unspecified cirrhosis of liver: Secondary | ICD-10-CM

## 2016-10-26 DIAGNOSIS — E1122 Type 2 diabetes mellitus with diabetic chronic kidney disease: Secondary | ICD-10-CM | POA: Diagnosis not present

## 2016-10-26 DIAGNOSIS — K7460 Unspecified cirrhosis of liver: Secondary | ICD-10-CM | POA: Diagnosis not present

## 2016-10-26 DIAGNOSIS — R188 Other ascites: Secondary | ICD-10-CM | POA: Diagnosis not present

## 2016-10-26 DIAGNOSIS — N183 Chronic kidney disease, stage 3 (moderate): Secondary | ICD-10-CM | POA: Diagnosis not present

## 2016-10-27 ENCOUNTER — Ambulatory Visit (HOSPITAL_COMMUNITY)
Admission: RE | Admit: 2016-10-27 | Discharge: 2016-10-27 | Disposition: A | Discharge status: Home / Self Care | Payer: Medicare Other | Source: Ambulatory Visit | Attending: Gastroenterology | Admitting: Gastroenterology

## 2016-10-27 DIAGNOSIS — R188 Other ascites: Secondary | ICD-10-CM | POA: Diagnosis not present

## 2016-10-27 DIAGNOSIS — K7460 Unspecified cirrhosis of liver: Secondary | ICD-10-CM

## 2016-10-27 HISTORY — PX: IR PARACENTESIS: IMG2679

## 2016-10-27 MED ORDER — LIDOCAINE HCL (PF) 1 % IJ SOLN
INTRAMUSCULAR | Status: AC
  Filled 2016-10-27: qty 30

## 2016-10-27 MED ORDER — ALBUMIN HUMAN 25 % IV SOLN
50.0000 g | Freq: Once | INTRAVENOUS | Status: AC
  Administered 2016-10-27: 50 g via INTRAVENOUS
  Filled 2016-10-27: qty 200

## 2016-10-27 MED ORDER — LIDOCAINE HCL (PF) 1 % IJ SOLN
INTRAMUSCULAR | Status: DC | PRN
  Administered 2016-10-27: 10 mL

## 2016-10-28 ENCOUNTER — Encounter (HOSPITAL_COMMUNITY): Payer: Self-pay | Admitting: Student

## 2016-10-28 DIAGNOSIS — Z1389 Encounter for screening for other disorder: Secondary | ICD-10-CM | POA: Diagnosis not present

## 2016-10-28 DIAGNOSIS — Z794 Long term (current) use of insulin: Secondary | ICD-10-CM | POA: Diagnosis not present

## 2016-10-28 DIAGNOSIS — E1165 Type 2 diabetes mellitus with hyperglycemia: Secondary | ICD-10-CM | POA: Diagnosis not present

## 2016-10-28 DIAGNOSIS — Z0000 Encounter for general adult medical examination without abnormal findings: Secondary | ICD-10-CM | POA: Diagnosis not present

## 2016-10-28 DIAGNOSIS — E7800 Pure hypercholesterolemia, unspecified: Secondary | ICD-10-CM | POA: Diagnosis not present

## 2016-10-28 DIAGNOSIS — K7460 Unspecified cirrhosis of liver: Secondary | ICD-10-CM | POA: Diagnosis not present

## 2016-10-28 DIAGNOSIS — Z23 Encounter for immunization: Secondary | ICD-10-CM | POA: Diagnosis not present

## 2016-10-28 DIAGNOSIS — D509 Iron deficiency anemia, unspecified: Secondary | ICD-10-CM | POA: Diagnosis not present

## 2016-11-01 ENCOUNTER — Telehealth: Payer: Self-pay | Admitting: Cardiology

## 2016-11-01 MED ORDER — AMIODARONE HCL 200 MG PO TABS: 200.0000 mg | ORAL_TABLET | Freq: Every day | ORAL | 3 refills | Status: DC

## 2016-11-01 NOTE — Telephone Encounter (Signed)
SPOKE TO PATIENT. SHE PREFER A 90 DAY SUPPLY. E-SENT MEDICATION WITH REFILS

## 2016-11-01 NOTE — Telephone Encounter (Signed)
Pt c/o medication issue:  1. Name of Medication: Aodorone   2. How are you currently taking this medication (dosage and times per day)? 200mg  1xday   3. Are you having a reaction (difficulty breathing--STAT)? no 4. What is your medication issue? Medication under another doctors name she need refills and she wants Dr.jordan to write her a new prescription

## 2016-11-01 NOTE — Telephone Encounter (Signed)
LEFT MESSAGE TO CALL BACK- WHETHER SHE NEEDS A 30 DAY OR 90 DAY SUPPLY

## 2016-11-02 DIAGNOSIS — R531 Weakness: Secondary | ICD-10-CM | POA: Diagnosis not present

## 2016-11-02 DIAGNOSIS — E1122 Type 2 diabetes mellitus with diabetic chronic kidney disease: Secondary | ICD-10-CM | POA: Diagnosis not present

## 2016-11-02 DIAGNOSIS — I4892 Unspecified atrial flutter: Secondary | ICD-10-CM | POA: Diagnosis not present

## 2016-11-02 DIAGNOSIS — K7469 Other cirrhosis of liver: Secondary | ICD-10-CM | POA: Diagnosis not present

## 2016-11-02 DIAGNOSIS — R296 Repeated falls: Secondary | ICD-10-CM | POA: Diagnosis not present

## 2016-11-02 DIAGNOSIS — E785 Hyperlipidemia, unspecified: Secondary | ICD-10-CM | POA: Diagnosis not present

## 2016-11-02 DIAGNOSIS — I5032 Chronic diastolic (congestive) heart failure: Secondary | ICD-10-CM | POA: Diagnosis not present

## 2016-11-02 DIAGNOSIS — N183 Chronic kidney disease, stage 3 (moderate): Secondary | ICD-10-CM | POA: Diagnosis not present

## 2016-11-03 ENCOUNTER — Other Ambulatory Visit (HOSPITAL_COMMUNITY): Payer: Self-pay | Admitting: Gastroenterology

## 2016-11-03 DIAGNOSIS — R188 Other ascites: Principal | ICD-10-CM

## 2016-11-03 DIAGNOSIS — K7460 Unspecified cirrhosis of liver: Secondary | ICD-10-CM

## 2016-11-08 DIAGNOSIS — R531 Weakness: Secondary | ICD-10-CM | POA: Diagnosis not present

## 2016-11-09 ENCOUNTER — Encounter (HOSPITAL_COMMUNITY): Payer: Self-pay | Admitting: General Surgery

## 2016-11-09 ENCOUNTER — Ambulatory Visit (HOSPITAL_COMMUNITY)
Admission: RE | Admit: 2016-11-09 | Discharge: 2016-11-09 | Disposition: A | Discharge status: Home / Self Care | Payer: Medicare Other | Source: Ambulatory Visit | Attending: Gastroenterology | Admitting: Gastroenterology

## 2016-11-09 DIAGNOSIS — R188 Other ascites: Secondary | ICD-10-CM | POA: Diagnosis not present

## 2016-11-09 DIAGNOSIS — K7460 Unspecified cirrhosis of liver: Secondary | ICD-10-CM

## 2016-11-09 HISTORY — PX: IR PARACENTESIS: IMG2679

## 2016-11-09 MED ORDER — ALBUMIN HUMAN 25 % IV SOLN
50.0000 g | Freq: Once | INTRAVENOUS | Status: AC
  Administered 2016-11-09: 50 g via INTRAVENOUS
  Filled 2016-11-09: qty 200

## 2016-11-09 MED ORDER — LIDOCAINE HCL (PF) 1 % IJ SOLN
INTRAMUSCULAR | Status: AC
  Filled 2016-11-09: qty 5

## 2016-11-09 MED ORDER — LIDOCAINE HCL (PF) 1 % IJ SOLN
INTRAMUSCULAR | Status: DC | PRN
  Administered 2016-11-09: 10 mL

## 2016-11-09 NOTE — Procedures (Signed)
Ultrasound-guided therapeutic paracentesis performed yielding 9 liters of chylous colored fluid. No immediate complications.  Kaylob Wallen E 10:41 AM 11/09/2016

## 2016-11-10 ENCOUNTER — Ambulatory Visit (HOSPITAL_COMMUNITY): Payer: Self-pay

## 2016-11-10 DIAGNOSIS — E1122 Type 2 diabetes mellitus with diabetic chronic kidney disease: Secondary | ICD-10-CM | POA: Diagnosis not present

## 2016-11-10 DIAGNOSIS — I5032 Chronic diastolic (congestive) heart failure: Secondary | ICD-10-CM | POA: Diagnosis not present

## 2016-11-10 DIAGNOSIS — N183 Chronic kidney disease, stage 3 (moderate): Secondary | ICD-10-CM | POA: Diagnosis not present

## 2016-11-10 DIAGNOSIS — E785 Hyperlipidemia, unspecified: Secondary | ICD-10-CM | POA: Diagnosis not present

## 2016-11-10 DIAGNOSIS — K7469 Other cirrhosis of liver: Secondary | ICD-10-CM | POA: Diagnosis not present

## 2016-11-10 DIAGNOSIS — I4892 Unspecified atrial flutter: Secondary | ICD-10-CM | POA: Diagnosis not present

## 2016-11-12 ENCOUNTER — Ambulatory Visit (INDEPENDENT_AMBULATORY_CARE_PROVIDER_SITE_OTHER): Payer: Medicare Other | Admitting: Internal Medicine

## 2016-11-12 ENCOUNTER — Encounter: Payer: Self-pay | Admitting: Internal Medicine

## 2016-11-12 VITALS — BP 124/62 | HR 98 | Wt 172.0 lb

## 2016-11-12 DIAGNOSIS — E1142 Type 2 diabetes mellitus with diabetic polyneuropathy: Secondary | ICD-10-CM

## 2016-11-12 DIAGNOSIS — E1165 Type 2 diabetes mellitus with hyperglycemia: Secondary | ICD-10-CM | POA: Diagnosis not present

## 2016-11-12 DIAGNOSIS — E785 Hyperlipidemia, unspecified: Secondary | ICD-10-CM

## 2016-11-12 LAB — POCT GLYCOSYLATED HEMOGLOBIN (HGB A1C): Hemoglobin A1C: 6.6

## 2016-11-12 MED ORDER — INSULIN PEN NEEDLE 32G X 4 MM MISC: 11 refills | Status: DC

## 2016-11-12 MED ORDER — GLIMEPIRIDE 2 MG PO TABS: 2.0000 mg | ORAL_TABLET | Freq: Two times a day (BID) | ORAL | 3 refills | Status: DC

## 2016-11-12 NOTE — Addendum Note (Signed)
Addended by: Caprice Beaver T on: 11/12/2016 01:17 PM   Modules accepted: Orders

## 2016-11-12 NOTE — Patient Instructions (Addendum)
Please continue: - Amaryl 2 mg 2x a day before meals - Humulin NPH 25 units in am and 15 units at bedtime  Please stop Amaryl before b'fast the days you go for the paracentesis.  Please return in 4 months with your sugar log.

## 2016-11-12 NOTE — Progress Notes (Signed)
Patient ID: Natalie Lane, female   DOB: 06-Mar-1945, 71 y.o.   MRN: [id number]  HPI: Natalie Lane is a 71 y.o.-year-old female, returning for f/u for DM2, dx in 2004, insulin-dependent since 2015, uncontrolled, with complications (PN). Last visit 5.5 mo ago. She has M[facility]care part A and B. She has [facility] for drugs.  She has decompensated cirrhosis >> gets paracentesis q 2 weeks.  Not a candidate for transplant or liver stent. She tells me she was given 3 months to a year to live...  Her sugars are dropping after paracentesis and she has to be given something sweet so she had more spikes in the last 2 weeks. Overall, per review of her log they have been at goal before this period.  Last hemoglobin A1c was: Lab Results  Component Value Date   HGBA1C 6.7 05/31/2016   HGBA1C 7.2 01/28/2016   HGBA1C 7.6 (H) 11/04/2015  11/27/2013: 8.4%  Pt was on a regimen of: - Lantus 40 units qhs - Metformin XR 500 mg 2x a day - added 03/18/2014 - Jardiance 25 mg daily in am - added 03/18/2014  - Welchol 1 package a day  Now on: - Amaryl 2 mg 2x a day before meals - NPH 25 units in am and 15 units at bedtime Stopped Metformin XR 500 mg 2x a day with meals because of the liver condition. Stopped Welchol b/c price. She tried Invokana, not covered. She was on Jardiance 25 mg before breakfast >> had to stop b/c expensive! In the donut hole. We tried Glipizide XL >> rash, palpitations >> she had to stop it after a week.  She was on Metformin - taken off as she had cirrhosis. She had diarrhea with regular metformin. She was on Tradjenta 5 mg in am She was on Welchol 3x a week >> not taking it frequently She tried [place] >> too expensive.  Pt checks her sugars 1-2x a day: - am:  99-134, 166, 188 >> 43-150 >> 75, 86-144, 194, 230, 385 (higher in last month - cookies as she drops sugars after the paracenthesis) - 2h after b'fast:  175-240 >> n/c >> 171 >> n/c - before lunch: 164-170, 223 >>  181 >> 100, 151 >> n/c - 2h after lunch:  165 >> n/c >> 203 >> n/c - before dinner: 135 >> 168-181 >> n/c - 2h after dinner: 207 >> n/c >> 174 >> 180 >> n/c - bedtime: 109-203 >> 108-178 >> n/c - nighttime: n/c Lowest sugar was 43 >> 75; she has hypoglycemia awareness at 70. Highest sugar was 151 >> 385  Glucometer: One Touch Verio Flex  Pt's meals are: - Breakfast: oatmeal or [place] bacon or sausage + eggs - Lunch: chilli beans; vegetable or hamburger soup - Dinner: salad or bowl of cereals She saw Antonieta Iba (nutrition).  - + CKD, last BUN/creatinine:  Lab Results  Component Value Date   BUN 42 (H) 09/04/2016   CREATININE 1.82 (H) 09/04/2016  08/26/2015: 15/0.72 - last set of lipids: 12/18/2014: 309/175/56/218 Lab Results  Component Value Date   CHOL 287 (A) 02/28/2014   HDL 62 02/28/2014   LDLCALC 196 02/28/2014   TRIG 148 02/28/2014  Welchol >> cramps. Statins >> leg and foot cramps.  - last eye exam was in 09/2014 >> No DR - she has numbness and tingling in her feet.  ROS: Constitutional: + weight gain/no weight loss, + fatigue, + subjective hypothermia Eyes: + blurry vision, no xerophthalmia ENT:  no sore throat, no nodules palpated in throat, no dysphagia, no odynophagia, no hoarseness Cardiovascular: no CP/+ SOB/no palpitations/no leg swelling Respiratory: + cough/+ SOB/no wheezing Gastrointestinal: no N/no V/no D/+ C/no acid reflux Musculoskeletal:+ muscle aches/no joint aches Skin: no rashes, no hair loss Neurological: no tremors/+ numbness/+ tingling/no dizziness, + HA  I reviewed pt's medications, allergies, PMH, social hx, family hx, and changes were documented in the history of present illness. Otherwise, unchanged from my initial visit note.   Past Medical History:  Diagnosis Date  . Anemia   . Ascites   . CHD (congenital heart disease)    a. 1979 s/p septal defect repair (? VSD vs ASD), old op notes not available;  b. 04/2016 Echo: EF 60-65%, no  rwma, Gr2 DD, mild AS, mild TR, no PFO (prev repaired).  . Chronic diastolic CHF (congestive heart failure) ([place])    04/2016 Echo: EF 60-65%, no rwma, Gr2 DD.  Marland Kitchen Complication of anesthesia   . 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 04/2016 s/p paracentesis.  . Diabetes mellitus, type 2 ([place])   . Dyspnea   . Edema   . Gastric varices    a. s/p banding.  Marland Kitchen GERD (gastroesophageal reflux disease)   . History of blood transfusion   . Hyperlipidemia   . Normal stress echocardiogram 2008  . Pancreatic adenoma   . Paroxysmal atrial flutter ([place])    a. 11/2009 s/p RFCA.  Marland Kitchen PONV (postoperative nausea and vomiting)    with Cholecystectomy  . Tobacco abuse    a. smokes 4 cigarettes/day.   Past Surgical History:  Procedure Laterality Date  . ABDOMINAL HYSTERECTOMY    . ASD versus VSD repair  1979   Operative notes not available.  . ATRIAL ABLATION SURGERY  10 /2011  . [place]   x2  . CHOLECYSTECTOMY    . ESOPHAGOGASTRODUODENOSCOPY (EGD) WITH PROPOFOL N/A 11/16/2013   Procedure: ESOPHAGOGASTRODUODENOSCOPY (EGD) WITH PROPOFOL;  Surgeon: Lear Ng, MD;  Location: [facility];  Service: Endoscopy;  Laterality: N/A;  . ESOPHAGOGASTRODUODENOSCOPY (EGD) WITH PROPOFOL N/A 01/23/2014   Procedure: ESOPHAGOGASTRODUODENOSCOPY (EGD) WITH PROPOFOL;  Surgeon: Lear Ng, MD;  Location: [facility];  Service: Endoscopy;  Laterality: N/A;  . GASTRIC VARICES BANDING N/A 11/16/2013   Procedure: GASTRIC VARICES BANDING;  Surgeon: Lear Ng, MD;  Location: [facility];  Service: Endoscopy;  Laterality: N/A;  . GASTRIC VARICES BANDING N/A 01/23/2014   Procedure: GASTRIC VARICES BANDING;  Surgeon: Lear Ng, MD;  Location: [facility];  Service: Endoscopy;  Laterality: N/A;  . HERNIA REPAIR    . IR GENERIC HISTORICAL  05/11/2016   IR PARACENTESIS 05/11/2016 Ascencion Dike, PA-C [facility]  . IR PARACENTESIS  07/08/2016  . IR PARACENTESIS  08/09/2016  . IR  PARACENTESIS  08/24/2016  . IR PARACENTESIS  09/03/2016  . IR PARACENTESIS  09/16/2016  . IR PARACENTESIS  09/23/2016  . IR PARACENTESIS  10/07/2016  . IR PARACENTESIS  10/14/2016  . IR PARACENTESIS  10/27/2016  . IR PARACENTESIS  11/09/2016  . OTHER SURGICAL HISTORY     partial hysterectomy  . PARACENTESIS  09/03/2016   History   Social History  . Marital Status: Married    Spouse Name: N/A    Number of Children: 3   Occupational History  . retired    Social History Main Topics  . Smoking status: Current Every Day Smoker -- 0.50 packs/day for 36 years    Types: Cigarettes  .  Smokeless tobacco: Never Used  . Alcohol Use: No  . Drug Use: No   Current Outpatient Prescriptions on File Prior to Visit  Medication Sig Dispense Refill  . amiodarone (PACERONE) 200 MG tablet Take 1 tablet (200 mg total) by mouth daily. 90 tablet 3  . furosemide (LASIX) 80 MG tablet Take 1 tablet (80 mg total) by mouth 2 (two) times daily. 60 tablet 0  . glimepiride (AMARYL) 2 MG tablet Take 1 tablet (2 mg total) by mouth 2 (two) times daily before a meal. (Patient taking differently: Take 1 mg by mouth 2 (two) times daily. ) 180 tablet 3  . glucose blood (ONE TOUCH ULTRA TEST) test strip Use as instructed to check sugar 2 times daily   Dx- E11.42 200 each 5  . insulin NPH Human (NOVOLIN N) 100 UNIT/ML injection Inject 20 units twice daily before meals. (Patient taking differently: Inject 15-25 Units into the skin See admin instructions. Inject 25 units into the skin in the morning and 15 units into the skin at night) 10 mL 4  . lactulose (CHRONULAC) 10 GM/15ML solution Take 30 mLs (20 g total) by mouth 3 (three) times daily. 240 mL 0  . ondansetron (ZOFRAN-ODT) 4 MG disintegrating tablet Take 4 mg by mouth daily as needed for nausea.    . ranitidine (ZANTAC) 150 MG capsule Take 150 mg by mouth daily as needed for heartburn.    . Simethicone (GAS-X PO) Take 1 tablet by mouth daily as needed (gas).    Marland Kitchen  spironolactone (ALDACTONE) 50 MG tablet Take 1 tablet by mouth daily.     No current facility-administered medications on file prior to visit.    Allergies  Allergen Reactions  . Monosodium Glutamate Anaphylaxis    MSG  . Red Dye Anaphylaxis  . Erythromycin Other (See Comments)    Reaction:  Migraine   . Penicillins Other (See Comments)    Reaction:  Migraine  Has patient had a PCN reaction causing immediate rash, facial/tongue/throat swelling, SOB or lightheadedness with hypotension: No Has patient had a PCN reaction causing severe rash involving mucus membranes or skin necrosis: No Has patient had a PCN reaction that required hospitalization No Has patient had a PCN reaction occurring within the last 10 years: No If all of the above answers are "NO", then may proceed with Cephalosporin use.  . Statins Other (See Comments)    Reaction:  Severe leg cramps   . Sulfa Antibiotics Hives  . Glipizide Rash  . Iohexol Rash  . Spironolactone Palpitations and Other (See Comments)    Sweating  . Tape Rash    "caused blisters"   Family History  Problem Relation Age of Onset  . Hypertension Mother   . Kidney disease Sister   . Depression Paternal Grandfather   . Diabetes Brother    PE: BP 124/62 (BP Location: Left Arm, Patient Position: Sitting)   Pulse 98   Wt 172 lb (78 kg)   SpO2 90%   BMI 30.47 kg/m  Body mass index is 30.47 kg/m.   Wt Readings from Last 3 Encounters:  11/12/16 172 lb (78 kg)  09/30/16 178 lb 6.4 oz (80.9 kg)  09/05/16 171 lb 8.3 oz (77.8 kg)   Constitutional: overweight, in NAD, appears SOB Eyes: PERRLA, EOMI, no exophthalmos ENT: moist mucous membranes, no thyromegaly, no cervical lymphadenopathy Cardiovascular: RRR, No RG, + 1/6 SEM Respiratory: CTA B Gastrointestinal: abdomen soft, NT, ND, BS+ Musculoskeletal: no deformities, strength intact in all 4  Skin: moist, warm, no rashes Neurological: no tremor with outstretched hands, DTR normal in all  4   ASSESSMENT: 1. DM2, insulin-dependent, uncontrolled, with complications - PN  2. HL  PLAN:  1. Patient with long-standing, uncontrolled diabetes, With better control in last year, but unfortunately in end-stage liver disease. She is not a candidate for transplant due to age and kidney disease still at this point we need to try to avoid hypo-an hyperglycemia, however, we'll not target perfect blood sugars. She continues on intermediate acting insulin and also glimepiride. Metformin was stopped by GI. -  since she is dropping her sugars after paracentesis, I advised her to not take Amaryl with breakfast that morning  - No other changes are needed in her regimen  - I suggested:  Patient Instructions  Please continue: - Amaryl 2 mg 2x a day before meals - Humulin NPH 25 units in am and 15 units at bedtime  Please stop Amaryl before b'fast the days you go for the paracentesis.  Please return in 4 months with your sugar log.   - today, HbA1c is 6.6% (great!) - continue checking sugars at different times of the day - check 1x a day, rotating checks - advised for yearly eye exams >> she is due - Return to clinic in 4 mo with sugar log   2. HL -  reviewed latest lipid levels>> LDL was VERY high,  but she could not tolerate statins or WelChol in the past    - due to the low life expectancy, we will not check another lipid panel today   Philemon Kingdom, MD PhD [facility] Endocrinology

## 2016-11-13 DIAGNOSIS — E1122 Type 2 diabetes mellitus with diabetic chronic kidney disease: Secondary | ICD-10-CM | POA: Diagnosis not present

## 2016-11-13 DIAGNOSIS — I5032 Chronic diastolic (congestive) heart failure: Secondary | ICD-10-CM | POA: Diagnosis not present

## 2016-11-13 DIAGNOSIS — I4892 Unspecified atrial flutter: Secondary | ICD-10-CM | POA: Diagnosis not present

## 2016-11-13 DIAGNOSIS — E785 Hyperlipidemia, unspecified: Secondary | ICD-10-CM | POA: Diagnosis not present

## 2016-11-13 DIAGNOSIS — N183 Chronic kidney disease, stage 3 (moderate): Secondary | ICD-10-CM | POA: Diagnosis not present

## 2016-11-13 DIAGNOSIS — K7469 Other cirrhosis of liver: Secondary | ICD-10-CM | POA: Diagnosis not present

## 2016-11-15 DIAGNOSIS — K7460 Unspecified cirrhosis of liver: Secondary | ICD-10-CM | POA: Diagnosis not present

## 2016-11-15 DIAGNOSIS — I5032 Chronic diastolic (congestive) heart failure: Secondary | ICD-10-CM | POA: Diagnosis not present

## 2016-11-15 DIAGNOSIS — I4892 Unspecified atrial flutter: Secondary | ICD-10-CM | POA: Diagnosis not present

## 2016-11-15 DIAGNOSIS — N183 Chronic kidney disease, stage 3 (moderate): Secondary | ICD-10-CM | POA: Diagnosis not present

## 2016-11-15 DIAGNOSIS — E1122 Type 2 diabetes mellitus with diabetic chronic kidney disease: Secondary | ICD-10-CM | POA: Diagnosis not present

## 2016-11-15 DIAGNOSIS — R188 Other ascites: Secondary | ICD-10-CM | POA: Diagnosis not present

## 2016-11-15 DIAGNOSIS — E785 Hyperlipidemia, unspecified: Secondary | ICD-10-CM | POA: Diagnosis not present

## 2016-11-15 DIAGNOSIS — K7469 Other cirrhosis of liver: Secondary | ICD-10-CM | POA: Diagnosis not present

## 2016-11-15 DIAGNOSIS — K219 Gastro-esophageal reflux disease without esophagitis: Secondary | ICD-10-CM | POA: Diagnosis not present

## 2016-11-16 ENCOUNTER — Other Ambulatory Visit (HOSPITAL_COMMUNITY): Payer: Self-pay | Admitting: Gastroenterology

## 2016-11-16 ENCOUNTER — Telehealth: Payer: Self-pay | Admitting: *Deleted

## 2016-11-16 ENCOUNTER — Other Ambulatory Visit: Payer: Self-pay

## 2016-11-16 DIAGNOSIS — I5032 Chronic diastolic (congestive) heart failure: Secondary | ICD-10-CM | POA: Diagnosis not present

## 2016-11-16 DIAGNOSIS — N183 Chronic kidney disease, stage 3 (moderate): Secondary | ICD-10-CM | POA: Diagnosis not present

## 2016-11-16 DIAGNOSIS — K7469 Other cirrhosis of liver: Secondary | ICD-10-CM | POA: Diagnosis not present

## 2016-11-16 DIAGNOSIS — K7460 Unspecified cirrhosis of liver: Secondary | ICD-10-CM

## 2016-11-16 DIAGNOSIS — I4892 Unspecified atrial flutter: Secondary | ICD-10-CM | POA: Diagnosis not present

## 2016-11-16 DIAGNOSIS — E1122 Type 2 diabetes mellitus with diabetic chronic kidney disease: Secondary | ICD-10-CM | POA: Diagnosis not present

## 2016-11-16 DIAGNOSIS — R188 Other ascites: Principal | ICD-10-CM

## 2016-11-16 DIAGNOSIS — E785 Hyperlipidemia, unspecified: Secondary | ICD-10-CM | POA: Diagnosis not present

## 2016-11-16 MED ORDER — INSULIN PEN NEEDLE 31G X 6 MM MISC: 5 refills | Status: DC

## 2016-11-16 NOTE — Telephone Encounter (Signed)
Submitted in smaller gauge needles.

## 2016-11-16 NOTE — Telephone Encounter (Signed)
Patient called and states she needs a refill of her Insulin Syringe needles. Patient states she wants the smaller size needle syringe. They are better for her.  Patient pharmacy is [facility] [address]. Thank you . If you have any questions . Patient states you can contact her . Her contact number is [phone]. Please advise. Thank you

## 2016-11-17 DIAGNOSIS — I4892 Unspecified atrial flutter: Secondary | ICD-10-CM | POA: Diagnosis not present

## 2016-11-17 DIAGNOSIS — E1122 Type 2 diabetes mellitus with diabetic chronic kidney disease: Secondary | ICD-10-CM | POA: Diagnosis not present

## 2016-11-17 DIAGNOSIS — K7460 Unspecified cirrhosis of liver: Secondary | ICD-10-CM | POA: Diagnosis not present

## 2016-11-17 DIAGNOSIS — N183 Chronic kidney disease, stage 3 (moderate): Secondary | ICD-10-CM | POA: Diagnosis not present

## 2016-11-17 DIAGNOSIS — I5032 Chronic diastolic (congestive) heart failure: Secondary | ICD-10-CM | POA: Diagnosis not present

## 2016-11-17 DIAGNOSIS — E785 Hyperlipidemia, unspecified: Secondary | ICD-10-CM | POA: Diagnosis not present

## 2016-11-17 DIAGNOSIS — K7469 Other cirrhosis of liver: Secondary | ICD-10-CM | POA: Diagnosis not present

## 2016-11-19 ENCOUNTER — Inpatient Hospital Stay (HOSPITAL_COMMUNITY)
Admission: EM | Admit: 2016-11-19 | Discharge: 2016-11-21 | DRG: 433 | Disposition: A | Discharge status: Home / Self Care | Payer: Medicare Other | Attending: Internal Medicine | Admitting: Internal Medicine

## 2016-11-19 ENCOUNTER — Emergency Department (HOSPITAL_COMMUNITY): Payer: Medicare Other

## 2016-11-19 ENCOUNTER — Encounter (HOSPITAL_COMMUNITY): Payer: Self-pay | Admitting: Neurology

## 2016-11-19 ENCOUNTER — Inpatient Hospital Stay (HOSPITAL_COMMUNITY): Payer: Medicare Other

## 2016-11-19 DIAGNOSIS — N179 Acute kidney failure, unspecified: Secondary | ICD-10-CM | POA: Diagnosis present

## 2016-11-19 DIAGNOSIS — I9589 Other hypotension: Secondary | ICD-10-CM | POA: Diagnosis present

## 2016-11-19 DIAGNOSIS — Z881 Allergy status to other antibiotic agents status: Secondary | ICD-10-CM | POA: Diagnosis not present

## 2016-11-19 DIAGNOSIS — E785 Hyperlipidemia, unspecified: Secondary | ICD-10-CM | POA: Diagnosis present

## 2016-11-19 DIAGNOSIS — I5032 Chronic diastolic (congestive) heart failure: Secondary | ICD-10-CM | POA: Diagnosis present

## 2016-11-19 DIAGNOSIS — R1030 Lower abdominal pain, unspecified: Secondary | ICD-10-CM

## 2016-11-19 DIAGNOSIS — R0902 Hypoxemia: Secondary | ICD-10-CM

## 2016-11-19 DIAGNOSIS — Z8249 Family history of ischemic heart disease and other diseases of the circulatory system: Secondary | ICD-10-CM

## 2016-11-19 DIAGNOSIS — Z841 Family history of disorders of kidney and ureter: Secondary | ICD-10-CM | POA: Diagnosis not present

## 2016-11-19 DIAGNOSIS — I4891 Unspecified atrial fibrillation: Secondary | ICD-10-CM | POA: Diagnosis present

## 2016-11-19 DIAGNOSIS — E1165 Type 2 diabetes mellitus with hyperglycemia: Secondary | ICD-10-CM | POA: Diagnosis present

## 2016-11-19 DIAGNOSIS — Z79899 Other long term (current) drug therapy: Secondary | ICD-10-CM

## 2016-11-19 DIAGNOSIS — Z882 Allergy status to sulfonamides status: Secondary | ICD-10-CM

## 2016-11-19 DIAGNOSIS — N185 Chronic kidney disease, stage 5: Secondary | ICD-10-CM | POA: Diagnosis present

## 2016-11-19 DIAGNOSIS — Z9102 Food additives allergy status: Secondary | ICD-10-CM

## 2016-11-19 DIAGNOSIS — E878 Other disorders of electrolyte and fluid balance, not elsewhere classified: Secondary | ICD-10-CM | POA: Diagnosis present

## 2016-11-19 DIAGNOSIS — Z888 Allergy status to other drugs, medicaments and biological substances status: Secondary | ICD-10-CM

## 2016-11-19 DIAGNOSIS — R1900 Intra-abdominal and pelvic swelling, mass and lump, unspecified site: Secondary | ICD-10-CM | POA: Diagnosis not present

## 2016-11-19 DIAGNOSIS — E1122 Type 2 diabetes mellitus with diabetic chronic kidney disease: Secondary | ICD-10-CM | POA: Diagnosis present

## 2016-11-19 DIAGNOSIS — N183 Chronic kidney disease, stage 3 (moderate): Secondary | ICD-10-CM | POA: Diagnosis not present

## 2016-11-19 DIAGNOSIS — Z794 Long term (current) use of insulin: Secondary | ICD-10-CM | POA: Diagnosis not present

## 2016-11-19 DIAGNOSIS — K7581 Nonalcoholic steatohepatitis (NASH): Secondary | ICD-10-CM | POA: Diagnosis present

## 2016-11-19 DIAGNOSIS — D649 Anemia, unspecified: Secondary | ICD-10-CM

## 2016-11-19 DIAGNOSIS — K7469 Other cirrhosis of liver: Secondary | ICD-10-CM | POA: Diagnosis present

## 2016-11-19 DIAGNOSIS — E871 Hypo-osmolality and hyponatremia: Secondary | ICD-10-CM | POA: Diagnosis not present

## 2016-11-19 DIAGNOSIS — Z90710 Acquired absence of both cervix and uterus: Secondary | ICD-10-CM

## 2016-11-19 DIAGNOSIS — Z833 Family history of diabetes mellitus: Secondary | ICD-10-CM | POA: Diagnosis not present

## 2016-11-19 DIAGNOSIS — E8809 Other disorders of plasma-protein metabolism, not elsewhere classified: Secondary | ICD-10-CM | POA: Diagnosis present

## 2016-11-19 DIAGNOSIS — R188 Other ascites: Secondary | ICD-10-CM | POA: Diagnosis present

## 2016-11-19 DIAGNOSIS — K219 Gastro-esophageal reflux disease without esophagitis: Secondary | ICD-10-CM | POA: Diagnosis present

## 2016-11-19 DIAGNOSIS — I130 Hypertensive heart and chronic kidney disease with heart failure and stage 1 through stage 4 chronic kidney disease, or unspecified chronic kidney disease: Secondary | ICD-10-CM | POA: Diagnosis present

## 2016-11-19 DIAGNOSIS — D631 Anemia in chronic kidney disease: Secondary | ICD-10-CM | POA: Diagnosis present

## 2016-11-19 DIAGNOSIS — R0602 Shortness of breath: Secondary | ICD-10-CM

## 2016-11-19 DIAGNOSIS — Z91041 Radiographic dye allergy status: Secondary | ICD-10-CM | POA: Diagnosis not present

## 2016-11-19 DIAGNOSIS — Z87891 Personal history of nicotine dependence: Secondary | ICD-10-CM

## 2016-11-19 DIAGNOSIS — Z9109 Other allergy status, other than to drugs and biological substances: Secondary | ICD-10-CM

## 2016-11-19 DIAGNOSIS — Z880 Allergy status to penicillin: Secondary | ICD-10-CM

## 2016-11-19 DIAGNOSIS — R110 Nausea: Secondary | ICD-10-CM

## 2016-11-19 DIAGNOSIS — I4892 Unspecified atrial flutter: Secondary | ICD-10-CM | POA: Diagnosis not present

## 2016-11-19 LAB — BRAIN NATRIURETIC PEPTIDE: B Natriuretic Peptide: 92.4 pg/mL (ref 0.0–100.0)

## 2016-11-19 LAB — COMPREHENSIVE METABOLIC PANEL
ALBUMIN: 3 g/dL — AB (ref 3.5–5.0)
ALT: 23 U/L (ref 14–54)
AST: 27 U/L (ref 15–41)
Alkaline Phosphatase: 72 U/L (ref 38–126)
Anion gap: 11 (ref 5–15)
BUN: 54 mg/dL — AB (ref 6–20)
CALCIUM: 8.8 mg/dL — AB (ref 8.9–10.3)
CHLORIDE: 97 mmol/L — AB (ref 101–111)
CO2: 21 mmol/L — AB (ref 22–32)
CREATININE: 2.62 mg/dL — AB (ref 0.44–1.00)
GFR calc Af Amer: 20 mL/min — ABNORMAL LOW (ref >60)
GFR calc non Af Amer: 17 mL/min — ABNORMAL LOW (ref >60)
GLUCOSE: 249 mg/dL — AB (ref 65–99)
Potassium: 5.1 mmol/L (ref 3.5–5.1)
Sodium: 129 mmol/L — ABNORMAL LOW (ref 135–145)
Total Bilirubin: 1.2 mg/dL (ref 0.3–1.2)
Total Protein: 6.3 g/dL — ABNORMAL LOW (ref 6.5–8.1)

## 2016-11-19 LAB — URINALYSIS, ROUTINE W REFLEX MICROSCOPIC
BILIRUBIN URINE: NEGATIVE
GLUCOSE, UA: NEGATIVE mg/dL
HGB URINE DIPSTICK: NEGATIVE
Ketones, ur: NEGATIVE mg/dL
Leukocytes, UA: NEGATIVE
Nitrite: NEGATIVE
PH: 5 (ref 5.0–8.0)
Protein, ur: NEGATIVE mg/dL
Specific Gravity, Urine: 1.011 (ref 1.005–1.030)

## 2016-11-19 LAB — CBC WITH DIFFERENTIAL/PLATELET
BASOS ABS: 0 10*3/uL (ref 0.0–0.1)
BASOS PCT: 0 %
Eosinophils Absolute: 0.1 10*3/uL (ref 0.0–0.7)
Eosinophils Relative: 2 %
HEMATOCRIT: 35.5 % — AB (ref 36.0–46.0)
HEMOGLOBIN: 11.9 g/dL — AB (ref 12.0–15.0)
Lymphocytes Relative: 13 %
Lymphs Abs: 1 10*3/uL (ref 0.7–4.0)
MCH: 31.2 pg (ref 26.0–34.0)
MCHC: 33.5 g/dL (ref 30.0–36.0)
MCV: 92.9 fL (ref 78.0–100.0)
Monocytes Absolute: 0.5 10*3/uL (ref 0.1–1.0)
Monocytes Relative: 7 %
NEUTROS ABS: 5.8 10*3/uL (ref 1.7–7.7)
NEUTROS PCT: 78 %
Platelets: 260 10*3/uL (ref 150–400)
RBC: 3.82 MIL/uL — AB (ref 3.87–5.11)
RDW: 14 % (ref 11.5–15.5)
WBC: 7.4 10*3/uL (ref 4.0–10.5)

## 2016-11-19 LAB — APTT: APTT: 33 s (ref 24–36)

## 2016-11-19 LAB — LIPASE, BLOOD: Lipase: 62 U/L — ABNORMAL HIGH (ref 11–51)

## 2016-11-19 LAB — I-STAT TROPONIN, ED: TROPONIN I, POC: 0 ng/mL (ref 0.00–0.08)

## 2016-11-19 LAB — PROTIME-INR
INR: 1.18
PROTHROMBIN TIME: 14.9 s (ref 11.4–15.2)

## 2016-11-19 MED ORDER — FUROSEMIDE 80 MG PO TABS: 80.0000 mg | ORAL_TABLET | Freq: Two times a day (BID) | ORAL | Status: DC

## 2016-11-19 MED ORDER — SPIRONOLACTONE 50 MG PO TABS
50.0000 mg | ORAL_TABLET | Freq: Every day | ORAL | Status: DC
  Administered 2016-11-20: 50 mg via ORAL
  Filled 2016-11-19: qty 1

## 2016-11-19 MED ORDER — AMIODARONE HCL 200 MG PO TABS: 200.0000 mg | ORAL_TABLET | Freq: Every day | ORAL | Status: DC

## 2016-11-19 MED ORDER — HYOSCYAMINE SULFATE 0.5 MG/ML IJ SOLN
0.2500 mg | INTRAMUSCULAR | Status: DC | PRN
  Administered 2016-11-20: 0.25 mg via INTRAVENOUS
  Filled 2016-11-19 (×2): qty 0.5

## 2016-11-19 MED ORDER — IPRATROPIUM-ALBUTEROL 0.5-2.5 (3) MG/3ML IN SOLN: 3.0000 mL | Freq: Four times a day (QID) | RESPIRATORY_TRACT | Status: DC

## 2016-11-19 MED ORDER — LIDOCAINE HCL (PF) 1 % IJ SOLN
5.0000 mL | Freq: Once | INTRAMUSCULAR | Status: AC
  Administered 2016-11-19: 5 mL via INTRADERMAL
  Filled 2016-11-19: qty 5

## 2016-11-19 MED ORDER — FENTANYL CITRATE (PF) 100 MCG/2ML IJ SOLN
25.0000 ug | Freq: Once | INTRAMUSCULAR | Status: AC
  Administered 2016-11-19: 25 ug via INTRAVENOUS
  Filled 2016-11-19: qty 2

## 2016-11-19 MED ORDER — IPRATROPIUM-ALBUTEROL 0.5-2.5 (3) MG/3ML IN SOLN: 3.0000 mL | RESPIRATORY_TRACT | Status: DC | PRN

## 2016-11-19 MED ORDER — ALBUMIN HUMAN 25 % IV SOLN
50.0000 g | Freq: Once | INTRAVENOUS | Status: AC
  Administered 2016-11-19: 50 g via INTRAVENOUS
  Filled 2016-11-19: qty 200

## 2016-11-19 MED ORDER — ALBUMIN HUMAN 25 % IV SOLN
12.5000 g | Freq: Once | INTRAVENOUS | Status: DC
  Filled 2016-11-19: qty 50

## 2016-11-19 MED ORDER — LIDOCAINE HCL (PF) 1 % IJ SOLN
INTRAMUSCULAR | Status: AC
  Filled 2016-11-19: qty 5

## 2016-11-19 NOTE — H&P (Addendum)
History and Physical    DILLIE BURANDT EGB:[id number] DOB: Mar 07, 1945 DOA: 11/19/2016  PCP: Carol Ada, MD  Patient coming from:HOME    HPI: Natalie Lane is a 71 y.o. 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 Patient states this feels exactly the same as her prior visits, reports that she gets therapeutic paracenteses every 2 weeks in order to draw fluid off of her abdomen, and that usually for the first week she feels better but then the second week is difficult to get through. She had her last paracentesis on 11/10/16, and is scheduled for her next one on 11/22/16 but her home health nurse came today and did not feel that she could make it to her appointment on Monday. She gets 9L of fluid drawn off each time. She is compliant with her Lasix 80 mg twice daily and spironolactone as well as her lactulose. She has not tried anything at home for symptoms, no known aggravating factors. She reports associated hiccups, decreased appetite, cough with clear sputum production, nausea intermittently, and intermittent abdominal pain. She describes the pain is 8/10 intermittent dull and sharp nonradiating suprapubic pain with no known aggravating or alleviating factors, no treatments tried prior to arrival. She states this pain is exact same as her prior admission and the same as when she has needed to get a paracentesis. Chart review reveals her last admission was 7/19-22/18, presented with the same complaints as today, during that admission she had therapeutic paracentesis yielding 9L, her lasix regimen was adjusted and she was sent home with lasix and  spironolactone which was adjusted per nephrology. She states her nephrologist Dr. Joelyn Oms does not want anyone adjusting or changing the diuretics except for him. She follows up with Hepatologist Dr. Michail Sermon at [facility], last saw him on Tuesday or Wednesday of this week (10/2 or 10/3), and he told her that she was doing well but stated her labs showed that her "kidney function was down". She has seen gastroenterology at the [facility] for EGD in the past, and has also seen [facility] Dr. Manuella Ghazi for evaluation of liver transplant (not a candidate) and consideration of TIPS procedure, last seen on 10/15/16. Her PCP is Engineering geologist at [facility]. She also follows with Dr. Martinique at [facility].  She denies fevers, chills, CP, hemoptysis, LE swelling, recent travel/surgery/immobilization, estrogen use, personal/family hx of DVT/PE, vomiting, diarrhea/constipation, obstipation, melena, hematochezia, hematuria, dysuria, myalgias, arthralgias, numbness, tingling, focal weakness, or any other complaints at this time. Denies recent travel, sick contacts, suspicious food intake, EtOH use, or NSAID use. PATIENT VERY SHORT OF BREATH,TACHYPNEIC ON 3LIT OXYGEN.DW IR .THEY WILL DO PARACENTESIS TOMORROW.  Review of Systems: As per HPI otherwise 10 point review of systems negative.    Past Medical History:  Diagnosis Date  . Anemia   . Ascites   . CHD (congenital heart disease)    a. 1979 s/p septal defect repair (? VSD vs ASD), old op notes not available;  b. 04/2016 Echo: EF 60-65%, no rwma, Gr2 DD, mild AS, mild TR, no PFO (prev repaired).  . Chronic diastolic CHF (congestive heart failure) ([place])    04/2016 Echo: EF 60-65%, no rwma, Gr2 DD.  Marland Kitchen  Complication of anesthesia   . 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 04/2016 s/p paracentesis.  . Diabetes mellitus, type 2 ([place])   . Dyspnea   . Edema   . Gastric varices    a. s/p banding.  Marland Kitchen GERD (gastroesophageal reflux disease)   . History of blood transfusion   . Hyperlipidemia     . Normal stress echocardiogram 2008  . Pancreatic adenoma   . Paroxysmal atrial flutter ([place])    a. 11/2009 s/p RFCA.  Marland Kitchen PONV (postoperative nausea and vomiting)    with Cholecystectomy  . Tobacco abuse    a. smokes 4 cigarettes/day.    Past Surgical History:  Procedure Laterality Date  . ABDOMINAL HYSTERECTOMY    . ASD versus VSD repair  1979   Operative notes not available.  . ATRIAL ABLATION SURGERY  10 /2011  . [place]   x2  . CHOLECYSTECTOMY    . ESOPHAGOGASTRODUODENOSCOPY (EGD) WITH PROPOFOL N/A 11/16/2013   Procedure: ESOPHAGOGASTRODUODENOSCOPY (EGD) WITH PROPOFOL;  Surgeon: Lear Ng, MD;  Location: [facility];  Service: Endoscopy;  Laterality: N/A;  . ESOPHAGOGASTRODUODENOSCOPY (EGD) WITH PROPOFOL N/A 01/23/2014   Procedure: ESOPHAGOGASTRODUODENOSCOPY (EGD) WITH PROPOFOL;  Surgeon: Lear Ng, MD;  Location: [place] [facility];  Service: Endoscopy;  Laterality: N/A;  . GASTRIC VARICES BANDING N/A 11/16/2013   Procedure: GASTRIC VARICES BANDING;  Surgeon: Lear Ng, MD;  Location: [facility];  Service: Endoscopy;  Laterality: N/A;  . GASTRIC VARICES BANDING N/A 01/23/2014   Procedure: GASTRIC VARICES BANDING;  Surgeon: Lear Ng, MD;  Location: [facility];  Service: Endoscopy;  Laterality: N/A;  . HERNIA REPAIR    . IR GENERIC HISTORICAL  05/11/2016   IR PARACENTESIS 05/11/2016 Ascencion Dike, PA-C [facility]-INTERV RAD  . IR PARACENTESIS  07/08/2016  . IR PARACENTESIS  08/09/2016  . IR PARACENTESIS  08/24/2016  . IR PARACENTESIS  09/03/2016  . IR PARACENTESIS  09/16/2016  . IR PARACENTESIS  09/23/2016  . IR PARACENTESIS  10/07/2016  . IR PARACENTESIS  10/14/2016  . IR PARACENTESIS  10/27/2016  . IR PARACENTESIS  11/09/2016  . OTHER SURGICAL HISTORY     partial hysterectomy  . PARACENTESIS  09/03/2016     reports that she quit smoking about 9 months ago. Her smoking use included Cigarettes. She has a 18.00 pack-year smoking history. She  has never used smokeless tobacco. She reports that she does not drink alcohol or use drugs.  Allergies  Allergen Reactions  . Monosodium Glutamate Anaphylaxis    MSG  . Red Dye Anaphylaxis  . Erythromycin Other (See Comments)    Reaction:  Migraine   . Penicillins Other (See Comments)    Reaction:  Migraine  Has patient had a PCN reaction causing immediate rash, facial/tongue/throat swelling, SOB or lightheadedness with hypotension: No Has patient had a PCN reaction causing severe rash involving mucus membranes or skin necrosis: No Has patient had a PCN reaction that required hospitalization No Has patient had a PCN reaction occurring within the last 10 years: No If all of the above answers are "NO", then may proceed with Cephalosporin use.  . Statins Other (See Comments)    Reaction:  Severe leg cramps   . Sulfa Antibiotics Hives  . Glipizide Rash  . Iohexol Rash  . Tape Rash    "caused blisters"    Family History  Problem Relation Age of Onset  . Hypertension Mother   . Kidney disease Sister   . Depression  Paternal Grandfather   . Diabetes Brother    Unacceptable: Noncontributory, unremarkable, or negative. Acceptable: Family history reviewed and not pertinent (If you reviewed it)  Prior to Admission medications   Medication Sig Start Date End Date Taking? Authorizing Provider  amiodarone (PACERONE) 200 MG tablet Take 1 tablet (200 mg total) by mouth daily. 11/01/16  Yes Martinique, Peter M, MD  furosemide (LASIX) 80 MG tablet Take 1 tablet (80 mg total) by mouth 2 (two) times daily. 09/05/16  Yes Velvet Bathe, MD  glimepiride (AMARYL) 2 MG tablet Take 1 tablet (2 mg total) by mouth 2 (two) times daily before a meal. 11/12/16  Yes Philemon Kingdom, MD  insulin NPH Human (NOVOLIN N) 100 UNIT/ML injection Inject 20 units twice daily before meals. Patient taking differently: Inject 15-25 Units into the skin See admin instructions. Inject 25 units into the skin in the morning and 15  units into the skin at night 08/10/16  Yes Elayne Snare, MD  lactulose (CHRONULAC) 10 GM/15ML solution Take 30 mLs (20 g total) by mouth 3 (three) times daily. 09/05/16  Yes Velvet Bathe, MD  ondansetron (ZOFRAN-ODT) 4 MG disintegrating tablet Take 4 mg by mouth daily as needed for nausea. 08/31/16  Yes [provider]  ranitidine (ZANTAC) 150 MG capsule Take 150 mg by mouth daily as needed for heartburn.   Yes [provider]  Simethicone (GAS-X PO) Take 1 tablet by mouth daily as needed (gas).   Yes [provider]  spironolactone (ALDACTONE) 50 MG tablet Take 1 tablet by mouth daily. 09/15/16  Yes [provider]  glucose blood (ONE TOUCH ULTRA TEST) test strip Use as instructed to check sugar 2 times daily   Dx- E11.42 Patient not taking: Reported on 11/19/2016 10/04/16   Philemon Kingdom, MD  Insulin Pen Needle (CAREFINE PEN NEEDLES) 31G X 6 MM MISC Use to inject insulin 2 times daily Patient not taking: Reported on 11/19/2016 11/16/16   Philemon Kingdom, MD    Physical Exam: Vitals:   11/19/16 1448 11/19/16 1450 11/19/16 1500 11/19/16 1515  BP:  (!) 93/49 (!) 96/55 (!) 97/59  Pulse:  84 87 88  Resp:  20 (!) 23 (!) 25  Temp:  98.8 F (37.1 C)    TempSrc:  Oral    SpO2:  98% 95% 100%  Weight: 87.1 kg (192 lb)     Height: 5\' 3"  (1.6 m)       Constitutional: NAD, calm, comfortable Vitals:   11/19/16 1448 11/19/16 1450 11/19/16 1500 11/19/16 1515  BP:  (!) 93/49 (!) 96/55 (!) 97/59  Pulse:  84 87 88  Resp:  20 (!) 23 (!) 25  Temp:  98.8 F (37.1 C)    TempSrc:  Oral    SpO2:  98% 95% 100%  Weight: 87.1 kg (192 lb)     Height: 5\' 3"  (1.6 m)      Eyes: PERRL, lids and conjunctivae normal ENMT: Mucous membranes are moist. Posterior pharynx clear of any exudate or lesions.Normal dentition.  Neck: normal, supple, no masses, no thyromegaly Respiratory: clear to auscultation bilaterally, no wheezing, no crackles. Normal respiratory effort. No accessory  muscle use.  Cardiovascular: Regular rate and rhythm, no murmurs / rubs / gallops. No extremity edema. 2+ pedal pulses. No carotid bruits.  Abdomen: tender, Bowel sounds positive. DISTENDED. Musculoskeletal: no clubbing / cyanosis. No joint deformity upper and lower extremities. Good ROM, no contractures. Normal muscle tone.  Skin: no rashes, lesions, ulcers. No induration Neurologic: CN  2-12 grossly intact. Sensation intact, DTR normal. Strength 5/5 in all 4.  Psychiatric: Normal judgment and insight. Alert and oriented x 3. Normal mood.   Labs on Admission: I have personally reviewed following labs and imaging studies  CBC:  Recent Labs Lab 11/19/16 1500  WBC 7.4  NEUTROABS 5.8  HGB 11.9*  HCT 35.5*  MCV 92.9  PLT 540   Basic Metabolic Panel:  Recent Labs Lab 11/19/16 1500  NA 129*  K 5.1  CL 97*  CO2 21*  GLUCOSE 249*  BUN 54*  CREATININE 2.62*  CALCIUM 8.8*   GFR: Estimated Creatinine Clearance: 20.9 mL/min (A) (by C-G formula based on SCr of 2.62 mg/dL (H)). Liver Function Tests:  Recent Labs Lab 11/19/16 1500  AST 27  ALT 23  ALKPHOS 72  BILITOT 1.2  PROT 6.3*  ALBUMIN 3.0*    Recent Labs Lab 11/19/16 1500  LIPASE 62*   No results for input(s): AMMONIA in the last 168 hours. Coagulation Profile:  Recent Labs Lab 11/19/16 1500  INR 1.18   Cardiac Enzymes: No results for input(s): CKTOTAL, CKMB, CKMBINDEX, TROPONINI in the last 168 hours. BNP (last 3 results) No results for input(s): PROBNP in the last 8760 hours. HbA1C: No results for input(s): HGBA1C in the last 72 hours. CBG: No results for input(s): GLUCAP in the last 168 hours. Lipid Profile: No results for input(s): CHOL, HDL, LDLCALC, TRIG, CHOLHDL, LDLDIRECT in the last 72 hours. Thyroid Function Tests: No results for input(s): TSH, T4TOTAL, FREET4, T3FREE, THYROIDAB in the last 72 hours. Anemia Panel: No results for input(s): VITAMINB12, FOLATE, FERRITIN, TIBC, IRON, RETICCTPCT  in the last 72 hours. Urine analysis:    Component Value Date/Time   COLORURINE YELLOW 11/19/2016 1540   APPEARANCEUR CLEAR 11/19/2016 1540   LABSPEC 1.011 11/19/2016 1540   PHURINE 5.0 11/19/2016 1540   GLUCOSEU NEGATIVE 11/19/2016 1540   HGBUR NEGATIVE 11/19/2016 1540   BILIRUBINUR NEGATIVE 11/19/2016 1540   KETONESUR NEGATIVE 11/19/2016 1540   PROTEINUR NEGATIVE 11/19/2016 1540   NITRITE NEGATIVE 11/19/2016 1540   LEUKOCYTESUR NEGATIVE 11/19/2016 1540    Radiological Exams on Admission: Dg Abd Acute W/chest  Result Date: 11/19/2016 CLINICAL DATA:  71 year old female with a history of cirrhosis. Abdominal swelling. Shortness of breath. EXAM: DG ABDOMEN ACUTE W/ 1V CHEST COMPARISON:  09/02/2016 FINDINGS: Chest: Cardiomediastinal silhouette unchanged. Opacity in the right hilar region is unchanged. No pneumothorax. No pleural effusion. Ill-defined opacity at the bilateral lung bases. Gas within stomach and small bowel without abnormal distention. Note that portions of the abdomen are excluded from the exam. Surgical changes of cholecystectomy. Soft tissue density of the abdomen. IMPRESSION: Chest: Low lung volumes with bibasilar ill-defined opacities, potentially atelectasis/ scarring, and/or consolidation. No pleural effusion. Abdomen: Nonobstructive bowel gas pattern. Electronically Signed   By: Corrie Mckusick D.O.   On: 11/19/2016 15:59    EKG: Independently reviewed.   Assessment/Plan Active Problems:   Ascites  RECURENT ASCITES/NASH HYPONATREMIA ACUTE ON CKD DM2 DIASTOLIC CHF HYPOXIC RESPIRATORY FAILURE  PLAN PARACENTESIS,ALBUMIN PRIOR TO PROCEDURE,REPEAAT LABS TOMORROW.CONTINUE LASIX AND ALDACTONE.  DVT prophylaxis: SCD Code Status:FULL  Family Communication: DW SON Disposition Plan:TBD Consults called: IR Admission status: STEP DOWN   Georgette Shell MD [facility]  If 7PM-7AM, please contact night-coverage [email or web address] Password [email or web address]  11/19/2016,  5:32 PM

## 2016-11-19 NOTE — Procedures (Signed)
Interventional Radiology Procedure Note  Procedure: Bedside paracentesis, no imaging.  9L opaque, milky yellow fluid was removed.   Complications: None  Estimated Blood Loss: None  Recommendations: Fluid available if labs desired.   Signed,  Criselda Peaches, MD

## 2016-11-19 NOTE — ED Provider Notes (Signed)
[facility] DEPT Provider Note   CSN: [id number] Arrival date & time: 11/19/16  1447     History   Chief Complaint Chief Complaint  Patient presents with  . Shortness of Breath  . Ascites    HPI Natalie Lane is a 71 y.o. 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 Patient states this feels exactly the same as her prior visits, reports that she gets therapeutic paracenteses every 2 weeks in order to draw fluid off of her abdomen, and that usually for the first week she feels better but then the second week is difficult to get through. She had her last paracentesis on 11/10/16, and is scheduled for her next one on 11/22/16 but her home health nurse came today and did not feel that she could make it to her appointment on Monday. She gets 9L of fluid drawn off each time. She is compliant with her Lasix 80 mg twice daily and spironolactone as well as her lactulose. She has not tried anything at home for symptoms, no known aggravating factors. She reports associated hiccups, decreased appetite, cough with clear sputum production, nausea intermittently, and intermittent abdominal pain. She describes the pain is 8/10 intermittent dull and sharp nonradiating suprapubic pain with no known aggravating or alleviating factors, no treatments tried prior to arrival. She states this pain is exact same as her prior admission and the same as when she has needed to get a paracentesis. Chart review reveals her last admission was 7/19-22/18, presented with the same complaints as today, during that admission she had therapeutic paracentesis yielding 9L, her lasix regimen  was adjusted and she was sent home with lasix and spironolactone which was adjusted per nephrology. She states her nephrologist Dr. Joelyn Oms does not want anyone adjusting or changing the diuretics except for him. She follows up with Hepatologist Dr. Michail Sermon at [place], last saw him on Tuesday or Wednesday of this week (10/2 or 10/3), and he told her that she was doing well but stated her labs showed that her "kidney function was down". She has seen gastroenterology at the [facility] for EGD in the past, and has also seen [facility] Dr. Manuella Ghazi for evaluation of liver transplant (not a candidate) and consideration of TIPS procedure, last seen on 10/15/16. Her PCP is Engineering geologist at [place]. She also follows with Dr. Martinique at [facility].  She denies fevers, chills, CP, hemoptysis, LE swelling, recent travel/surgery/immobilization, estrogen use, personal/family hx of DVT/PE, vomiting, diarrhea/constipation, obstipation, melena, hematochezia, hematuria, dysuria, myalgias, arthralgias, numbness, tingling, focal weakness, or any other complaints at this time. Denies recent travel, sick contacts, suspicious food intake, EtOH use, or NSAID use.   The history is provided by the patient and medical records. No language interpreter was used.  Shortness of Breath  This is a recurrent problem. The average episode lasts 3 days. The problem occurs continuously.The current episode started more than 2 days ago. The problem has been gradually worsening. Associated symptoms include cough and abdominal pain. Pertinent negatives include no fever, no chest pain, no vomiting and no leg swelling. Precipitated by: needing paracentesis. Treatments tried: spironolactone and lasix 80mg  BID. The treatment provided no relief. She has had prior  hospitalizations. She has had prior ED visits. Associated medical issues include heart failure.    Past Medical History:  Diagnosis Date  . Anemia   . Ascites   . CHD (congenital heart disease)    a.  1979 s/p septal defect repair (? VSD vs ASD), old op notes not available;  b. 04/2016 Echo: EF 60-65%, no rwma, Gr2 DD, mild AS, mild TR, no PFO (prev repaired).  . Chronic diastolic CHF (congestive heart failure) ([place])    04/2016 Echo: EF 60-65%, no rwma, Gr2 DD.  Marland Kitchen Complication of anesthesia   . 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 04/2016 s/p paracentesis.  . Diabetes mellitus, type 2 ([place])   . Dyspnea   . Edema   . Gastric varices    a. s/p banding.  Marland Kitchen GERD (gastroesophageal reflux disease)   . History of blood transfusion   . Hyperlipidemia   . Normal stress echocardiogram 2008  . Pancreatic adenoma   . Paroxysmal atrial flutter ([place])    a. 11/2009 s/p RFCA.  Marland Kitchen PONV (postoperative nausea and vomiting)    with Cholecystectomy  . Tobacco abuse    a. smokes 4 cigarettes/day.    Patient Active Problem List   Diagnosis Date Noted  . SOB (shortness of breath)   . Hyperkalemia 09/02/2016  . Hypotension 09/02/2016  . Ascites of liver 09/02/2016  . Pressure injury of skin 08/24/2016  . ARF (acute renal failure) ([place]) 06/26/2016  . Tobacco use 06/26/2016  . Decompensated liver disease ([place]) 06/26/2016  . Acute kidney injury ([place])   . PAT (paroxysmal atrial tachycardia) ([place]) 05/12/2016  . Ascites 05/10/2016  . Decompensated hepatic cirrhosis ([place]) 05/10/2016  . Esophageal varices ([place]) 01/23/2014  . Cirrhosis of liver with ascites ([place]) 11/16/2013  . Chest pain at rest 09/23/2011  . Heart palpitations 12/03/2010  . Atrial flutter ([place])   . CHD (congenital heart disease)   . Edema   . Hyperlipidemia   . Poorly controlled type 2 diabetes mellitus with peripheral neuropathy [facility])     Past Surgical History:  Procedure Laterality Date  . ABDOMINAL HYSTERECTOMY    . ASD versus VSD repair  1979   Operative notes not available.  . ATRIAL ABLATION SURGERY  10 /2011  . [place]   x2  . CHOLECYSTECTOMY    . ESOPHAGOGASTRODUODENOSCOPY (EGD) WITH PROPOFOL N/A 11/16/2013    Procedure: ESOPHAGOGASTRODUODENOSCOPY (EGD) WITH PROPOFOL;  Surgeon: Lear Ng, MD;  Location: [facility];  Service: Endoscopy;  Laterality: N/A;  . ESOPHAGOGASTRODUODENOSCOPY (EGD) WITH PROPOFOL N/A 01/23/2014   Procedure: ESOPHAGOGASTRODUODENOSCOPY (EGD) WITH PROPOFOL;  Surgeon: Lear Ng, MD;  Location: [facility];  Service: Endoscopy;  Laterality: N/A;  . GASTRIC VARICES BANDING N/A 11/16/2013   Procedure: GASTRIC VARICES BANDING;  Surgeon: Lear Ng, MD;  Location: [facility];  Service: Endoscopy;  Laterality: N/A;  . GASTRIC VARICES BANDING N/A 01/23/2014   Procedure: GASTRIC VARICES BANDING;  Surgeon: Lear Ng, MD;  Location: [facility];  Service: Endoscopy;  Laterality: N/A;  . HERNIA REPAIR    . IR GENERIC HISTORICAL  05/11/2016   IR PARACENTESIS 05/11/2016 Ascencion Dike, PA-C [facility]-INTERV RAD  . IR PARACENTESIS  07/08/2016  . IR PARACENTESIS  08/09/2016  . IR PARACENTESIS  08/24/2016  . IR PARACENTESIS  09/03/2016  . IR PARACENTESIS  09/16/2016  . IR PARACENTESIS  09/23/2016  . IR PARACENTESIS  10/07/2016  . IR PARACENTESIS  10/14/2016  . IR PARACENTESIS  10/27/2016  .  IR PARACENTESIS  11/09/2016  . OTHER SURGICAL HISTORY     partial hysterectomy  . PARACENTESIS  09/03/2016    OB History    No data available       Home Medications    Prior to Admission medications   Medication Sig Start Date End Date Taking? Authorizing Provider  amiodarone (PACERONE) 200 MG tablet Take 1 tablet (200 mg total) by mouth daily. 11/01/16   Martinique, Peter M, MD  furosemide (LASIX) 80 MG tablet Take 1 tablet (80 mg total) by mouth 2 (two) times daily. 09/05/16   Velvet Bathe, MD  glimepiride (AMARYL) 2 MG tablet Take 1 tablet (2 mg total) by mouth 2 (two) times daily before a meal. 11/12/16   Philemon Kingdom, MD  glucose blood (ONE TOUCH ULTRA TEST) test strip Use as instructed to check sugar 2 times daily   Dx- E11.42 10/04/16   Philemon Kingdom, MD  insulin  NPH Human (NOVOLIN N) 100 UNIT/ML injection Inject 20 units twice daily before meals. Patient taking differently: Inject 15-25 Units into the skin See admin instructions. Inject 25 units into the skin in the morning and 15 units into the skin at night 08/10/16   Elayne Snare, MD  Insulin Pen Needle (CAREFINE PEN NEEDLES) 31G X 6 MM MISC Use to inject insulin 2 times daily 11/16/16   Philemon Kingdom, MD  lactulose (CHRONULAC) 10 GM/15ML solution Take 30 mLs (20 g total) by mouth 3 (three) times daily. 09/05/16   Velvet Bathe, MD  ondansetron (ZOFRAN-ODT) 4 MG disintegrating tablet Take 4 mg by mouth daily as needed for nausea. 08/31/16   [provider]  ranitidine (ZANTAC) 150 MG capsule Take 150 mg by mouth daily as needed for heartburn.    [provider]  Simethicone (GAS-X PO) Take 1 tablet by mouth daily as needed (gas).    [provider]  spironolactone (ALDACTONE) 50 MG tablet Take 1 tablet by mouth daily. 09/15/16   [provider]    Family History Family History  Problem Relation Age of Onset  . Hypertension Mother   . Kidney disease Sister   . Depression Paternal Grandfather   . Diabetes Brother     Social History Social History  Substance Use Topics  . Smoking status: Former Smoker    Packs/day: 0.50    Years: 36.00    Types: Cigarettes    Quit date: 02/16/2016  . Smokeless tobacco: Never Used     Comment: was smoking 4 cigs/day prior to admission.  . Alcohol use No     Allergies   Monosodium glutamate; Red dye; Erythromycin; Penicillins; Statins; Sulfa antibiotics; Glipizide; Iohexol; Spironolactone; and Tape   Review of Systems Review of Systems  Constitutional: Positive for appetite change. Negative for chills and fever.  Respiratory: Positive for cough and shortness of breath.   Cardiovascular: Negative for chest pain and leg swelling.  Gastrointestinal: Positive for abdominal distention, abdominal pain and nausea. Negative for  blood in stool, constipation, diarrhea and vomiting.  Genitourinary: Negative for dysuria and hematuria.  Musculoskeletal: Negative for arthralgias and myalgias.  Skin: Negative for color change.  Allergic/Immunologic: Positive for immunocompromised state (DM2).  Neurological: Negative for weakness and numbness.  Psychiatric/Behavioral: Negative for confusion.   All other systems reviewed and are negative for acute change except as noted in the HPI.    Physical Exam Updated Vital Signs BP (!) 93/49 (BP Location: Right Arm)   Pulse 84   Temp 98.8 F (37.1 C) (Oral)  Resp 20   Ht 5\' 3"  (1.6 m)   Wt 87.1 kg (192 lb)   SpO2 98%   BMI 34.01 kg/m   Physical Exam  Constitutional: She is oriented to person, place, and time. Vital signs are normal. She appears well-developed and well-nourished.  Non-toxic appearance. No distress.  Afebrile, nontoxic, NAD but at times appears slightly uncomfortable and more tachypneic, BP soft but similar to all prior visits  HENT:  Head: Normocephalic and atraumatic.  Mouth/Throat: Oropharynx is clear and moist and mucous membranes are normal.  Eyes: Conjunctivae and EOM are normal. Right eye exhibits no discharge. Left eye exhibits no discharge.  Neck: Normal range of motion. Neck supple.  Cardiovascular: Normal rate, regular rhythm and intact distal pulses.  Exam reveals no gallop and no friction rub.   Murmur heard. RRR, nl W1/X9, systolic murmur at LSB, no rubs/gallops, distal pulses intact, trace b/l pedal edema   Pulmonary/Chest: Effort normal and breath sounds normal. Tachypnea noted. No respiratory distress. She has no decreased breath sounds. She has no wheezes. She has no rhonchi. She has no rales.  Occasionally tachypneic, CTAB in all lung fields, no w/r/r, no hypoxia, speaking in slightly fragmented sentences,SpO2 98% on RA   Abdominal: Soft. Bowel sounds are normal. She exhibits distension and ascites. There is tenderness in the suprapubic  area. There is no rigidity, no rebound, no guarding, no CVA tenderness, no tenderness at McBurney's point and negative Murphy's sign.  Soft, largely distended with obvious ascites, +BS throughout, with mild suprapubic TTP, no r/g/r, neg murphy's, neg mcburney's, no CVA TTP   Musculoskeletal: Normal range of motion.  Neurological: She is alert and oriented to person, place, and time. She has normal strength. No sensory deficit.  Skin: Skin is warm, dry and intact. No rash noted.  Psychiatric: She has a normal mood and affect.  Nursing note and vitals reviewed.    ED Treatments / Results  Labs (all labs ordered are listed, but only abnormal results are displayed) Labs Reviewed  CBC WITH DIFFERENTIAL/PLATELET - Abnormal; Notable for the following:       Result Value   RBC 3.82 (*)    Hemoglobin 11.9 (*)    HCT 35.5 (*)    All other components within normal limits  COMPREHENSIVE METABOLIC PANEL - Abnormal; Notable for the following:    Sodium 129 (*)    Chloride 97 (*)    CO2 21 (*)    Glucose, Bld 249 (*)    BUN 54 (*)    Creatinine, Ser 2.62 (*)    Calcium 8.8 (*)    Total Protein 6.3 (*)    Albumin 3.0 (*)    GFR calc non Af Amer 17 (*)    GFR calc Af Amer 20 (*)    All other components within normal limits  LIPASE, BLOOD - Abnormal; Notable for the following:    Lipase 62 (*)    All other components within normal limits  URINALYSIS, ROUTINE W REFLEX MICROSCOPIC  BRAIN NATRIURETIC PEPTIDE  PROTIME-INR  APTT  I-STAT TROPONIN, ED    EKG  EKG Interpretation  Date/Time:  Friday November 19 2016 14:50:06 EDT Ventricular Rate:  84 PR Interval:    QRS Duration: 112 QT Interval:  391 QTC Calculation: 463 R Axis:   59 Text Interpretation:  Sinus rhythm Incomplete right bundle branch block Low voltage, precordial leads No significant change since last tracing Confirmed by Blanchie Dessert [id number]) on 11/19/2016 2:55:01 PM  Radiology Dg Abd Acute W/chest  Result  Date: 11/19/2016 CLINICAL DATA:  71 year old female with a history of cirrhosis. Abdominal swelling. Shortness of breath. EXAM: DG ABDOMEN ACUTE W/ 1V CHEST COMPARISON:  09/02/2016 FINDINGS: Chest: Cardiomediastinal silhouette unchanged. Opacity in the right hilar region is unchanged. No pneumothorax. No pleural effusion. Ill-defined opacity at the bilateral lung bases. Gas within stomach and small bowel without abnormal distention. Note that portions of the abdomen are excluded from the exam. Surgical changes of cholecystectomy. Soft tissue density of the abdomen. IMPRESSION: Chest: Low lung volumes with bibasilar ill-defined opacities, potentially atelectasis/ scarring, and/or consolidation. No pleural effusion. Abdomen: Nonobstructive bowel gas pattern. Electronically Signed   By: Corrie Mckusick D.O.   On: 11/19/2016 15:59     Echo 05/12/16: Study Conclusions - Left ventricle: The cavity size was normal. Wall thickness was   increased in a pattern of mild LVH. Systolic function was normal.   The estimated ejection fraction was in the range of 60% to 65%.   Wall motion was normal; there were no regional wall motion   abnormalities. Doppler parameters are consistent with   pseudonormal left ventricular relaxation (grade 2 diastolic   dysfunction). The E/e&' ratio is >15, suggesting elevated LV   filling pressure. - Ventricular septum: No residual VSD. - Aortic valve: Trileaflet; mildly calcified leaflets. Mild   stenosis. Mean gradient (S): 10 mm Hg. Peak gradient (S): 18 mm   Hg. Valve area (VTI): 1.12 cm^2. Valve area (Vmax): 1.21 cm^2.   Valve area (Vmean): 1.05 cm^2. - Mitral valve: Calcified annulus. Mildly thickened leaflets .   Valve area by pressure half-time: 2.22 cm^2. Valve area by   continuity equation (using LVOT flow): 1.15 cm^2. - Left atrium: The atrium was at the upper limits of normal in   size. - Right atrium: The atrium was at the upper limits of normal in   size. - Atrial  septum: No defect or patent foramen ovale was identified   (previously repaired). - Tricuspid valve: There was mild regurgitation. - Pulmonary arteries: PA peak pressure: 23 mm Hg (S). - Inferior vena cava: The vessel was normal in size. The   respirophasic diameter changes were in the normal range (>= 50%),   consistent with normal central venous pressure.  Impressions: - Compared to a prior study in 2017, there are no significant   changes.    Procedures Procedures (including critical care time)  Medications Ordered in ED Medications  fentaNYL (SUBLIMAZE) injection 25 mcg (25 mcg Intravenous Given 11/19/16 1632)     Initial Impression / Assessment and Plan / ED Course  I have reviewed the triage vital signs and the nursing notes.  Pertinent labs & imaging results that were available during my care of the patient were reviewed by me and considered in my medical decision making (see chart for details).     71 y.o. female here with worsening SOB and abd distension over the last 3 days, feels like prior visits where she's needed paracentesis done; gets them done every 2 weeks outpatient, had one 11/10/16 and is scheduled for this coming Monday 11/22/16 but her home health nurse didn't feel like she could wait given how uncomfortable she is. C/o hiccups, lack of appetite, cough with clear sputum production, nausea, SOB, and distension; also c/o intermittent suprapubic pain which is similar to prior visits. On exam, mild lower abd TTP, nonperitoneal, largely distended with ascites, trace pedal edema bilaterally, BP soft but same as all prior visits, no  tachycardia or hypoxia, mildly tachypneic at times, clear lung exam. EKG unchanged from prior and nonischemic. Doubt PE, doubt SBP. Will get labs, acute abd series, and give pain meds. Pt very hesitant to have any diuretics given because she states her nephrologist doesn't want anyone messing with those. Will reassess shortly. Discussed case  with my attending Dr. Maryan Rued who agrees with plan.  4:46 PM CBC w/diff with mild anemia which has slowly trended down over the last few months. CMP with worsening kidney function, mild hyponatremia and hypochloremia as well as mild hyperglycemia but without anion gap, and worsening hypoalbuminemia. Lipase mildly elevated at 62. U/A without evidence of UTI. Trop neg. BNP negative. INR and APTT WNL. Acute abd series without low lung volumes and bibasilar opacities likely atelectasis given that pt is not taking deep inspirations. Given worsening lab values across the board, and need for therapeutic paracentesis, will proceed with admission.   5:02 PM Dr. Zigmund Daniel of [facility] returning page and will admit. Holding orders to be placed by admitting team. Please see their notes for further documentation of care. I appreciate their help with this pleasant pt's care. Pt stable at time of admission.    Final Clinical Impressions(s) / ED Diagnoses   Final diagnoses:  Other ascites  Shortness of breath  Lower abdominal pain  Nausea  Hyponatremia  Hypochloremia  Hypoalbuminemia  Anemia, unspecified type  Type 2 diabetes mellitus with hyperglycemia, with long-term current use of insulin (HCC)  AKI (acute kidney injury) [facility])    New Prescriptions New Prescriptions   No medications on [address], [place], [place] 11/19/16 [place], MD 11/20/16 2032

## 2016-11-19 NOTE — Consult Note (Signed)
Chief Complaint: Patient was seen in consultation today for  Chief Complaint  Patient presents with  . Shortness of Breath  . Ascites   at the request of Dr. Jacki Cones  Referring Physician(s): Jacki Cones, MD   Patient Status: [facility] - ED  History of Present Illness: Natalie Lane is a 71 y.o. 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  She is on diuretics, currently managed by Dr. Michail Sermon of GI.  She undergoes scheduled LVP and has her next drainage scheduled for this coming Monday 11/22/16.  However, today she began to feel very full, uncomfortable and began experiencing some SOB and early satiety.  She called her doctors office and a home health nurse was sent to visit her.  Her nurse recommended she come to the ER.   She is resting in bed in no distress.  She is saturating 100% on 2L O2 via nasal canula.  She states that her abdomen feels tight, she is having some stretching pains in the LLQ.   When she eats, she feels full and belches and can only eat a few bites.  She is breathing easily at rest but feels SOB when moving or talking.  She feels better with the oxygen.   Denies chest pain, fever, chills, or other symptoms.   Past Medical History:  Diagnosis Date  . Anemia   . Ascites   . CHD (congenital heart disease)    a. 1979 s/p septal defect repair (? VSD vs ASD), old op notes not available;  b. 04/2016 Echo: EF 60-65%, no rwma, Gr2 DD, mild AS, mild TR, no PFO (prev repaired).  . Chronic diastolic CHF (congestive heart failure) ([place])    04/2016 Echo: EF 60-65%, no rwma, Gr2 DD.  Marland Kitchen Complication of anesthesia   . Cryptogenic cirrhosis ([place])    a. 04/2016 s/p paracentesis.  . Diabetes mellitus, type 2 ([place])   . Dyspnea   . Edema   . Gastric varices    a. s/p banding.  Marland Kitchen GERD (gastroesophageal reflux disease)   . History of blood transfusion   . Hyperlipidemia   . Normal stress echocardiogram 2008  . Pancreatic adenoma    . Paroxysmal atrial flutter ([place])    a. 11/2009 s/p RFCA.  Marland Kitchen PONV (postoperative nausea and vomiting)    with Cholecystectomy  . Tobacco abuse    a. smokes 4 cigarettes/day.    Past Surgical History:  Procedure Laterality Date  . ABDOMINAL HYSTERECTOMY    . ASD versus VSD repair  1979   Operative notes not available.  . ATRIAL ABLATION SURGERY  10 /2011  . [place]   x2  . CHOLECYSTECTOMY    . ESOPHAGOGASTRODUODENOSCOPY (EGD) WITH PROPOFOL N/A 11/16/2013   Procedure: ESOPHAGOGASTRODUODENOSCOPY (EGD) WITH PROPOFOL;  Surgeon: Lear Ng, MD;  Location: [place];  Service: Endoscopy;  Laterality: N/A;  . ESOPHAGOGASTRODUODENOSCOPY (EGD) WITH PROPOFOL N/A 01/23/2014   Procedure: ESOPHAGOGASTRODUODENOSCOPY (EGD) WITH PROPOFOL;  Surgeon: Lear Ng, MD;  Location: [place];  Service: Endoscopy;  Laterality: N/A;  . GASTRIC VARICES BANDING N/A 11/16/2013   Procedure: GASTRIC VARICES BANDING;  Surgeon: Lear Ng, MD;  Location: [place];  Service: Endoscopy;  Laterality: N/A;  . GASTRIC VARICES BANDING N/A 01/23/2014   Procedure: GASTRIC VARICES BANDING;  Surgeon: Lear Ng, MD;  Location: [place];  Service: Endoscopy;  Laterality: N/A;  . HERNIA REPAIR    . IR GENERIC HISTORICAL  05/11/2016  IR PARACENTESIS 05/11/2016 Ascencion Dike, PA-C [facility]-INTERV RAD  . IR PARACENTESIS  07/08/2016  . IR PARACENTESIS  08/09/2016  . IR PARACENTESIS  08/24/2016  . IR PARACENTESIS  09/03/2016  . IR PARACENTESIS  09/16/2016  . IR PARACENTESIS  09/23/2016  . IR PARACENTESIS  10/07/2016  . IR PARACENTESIS  10/14/2016  . IR PARACENTESIS  10/27/2016  . IR PARACENTESIS  11/09/2016  . OTHER SURGICAL HISTORY     partial hysterectomy  . PARACENTESIS  09/03/2016    Allergies: Monosodium glutamate; Red dye; Erythromycin; Penicillins; Statins; Sulfa antibiotics; Glipizide; Iohexol; and Tape  Medications: Prior to Admission medications   Medication Sig Start  Date End Date Taking? Authorizing Provider  amiodarone (PACERONE) 200 MG tablet Take 1 tablet (200 mg total) by mouth daily. 11/01/16  Yes Martinique, Peter M, MD  furosemide (LASIX) 80 MG tablet Take 1 tablet (80 mg total) by mouth 2 (two) times daily. 09/05/16  Yes Velvet Bathe, MD  glimepiride (AMARYL) 2 MG tablet Take 1 tablet (2 mg total) by mouth 2 (two) times daily before a meal. 11/12/16  Yes Philemon Kingdom, MD  insulin NPH Human (NOVOLIN N) 100 UNIT/ML injection Inject 20 units twice daily before meals. Patient taking differently: Inject 15-25 Units into the skin See admin instructions. Inject 25 units into the skin in the morning and 15 units into the skin at night 08/10/16  Yes Elayne Snare, MD  lactulose (CHRONULAC) 10 GM/15ML solution Take 30 mLs (20 g total) by mouth 3 (three) times daily. 09/05/16  Yes Velvet Bathe, MD  ondansetron (ZOFRAN-ODT) 4 MG disintegrating tablet Take 4 mg by mouth daily as needed for nausea. 08/31/16  Yes [provider]  ranitidine (ZANTAC) 150 MG capsule Take 150 mg by mouth daily as needed for heartburn.   Yes [provider]  Simethicone (GAS-X PO) Take 1 tablet by mouth daily as needed (gas).   Yes [provider]  spironolactone (ALDACTONE) 50 MG tablet Take 1 tablet by mouth daily. 09/15/16  Yes [provider]  glucose blood (ONE TOUCH ULTRA TEST) test strip Use as instructed to check sugar 2 times daily   Dx- E11.42 Patient not taking: Reported on 11/19/2016 10/04/16   Philemon Kingdom, MD  Insulin Pen Needle (CAREFINE PEN NEEDLES) 31G X 6 MM MISC Use to inject insulin 2 times daily Patient not taking: Reported on 11/19/2016 11/16/16   Philemon Kingdom, MD     Family History  Problem Relation Age of Onset  . Hypertension Mother   . Kidney disease Sister   . Depression Paternal Grandfather   . Diabetes Brother     Social History   Social History  . Marital status: Married    Spouse name: N/A  . Number of  children: 3  . Years of education: N/A   Occupational History  . Office management [employer]   Social History Main Topics  . Smoking status: Former Smoker    Packs/day: 0.50    Years: 36.00    Types: Cigarettes    Quit date: 02/16/2016  . Smokeless tobacco: Never Used     Comment: was smoking 4 cigs/day prior to admission.  . Alcohol use No  . Drug use: No  . Sexual activity: Not Asked   Other Topics Concern  . None   Social History Narrative   Lives in [place] with boyfriend.  Does not routinely exercise.    Review of Systems: A 12 point ROS discussed and pertinent positives are indicated in  the HPI above.  All other systems are negative.  Review of Systems  Vital Signs: BP 92/60   Pulse 82   Temp 98.8 F (37.1 C) (Oral)   Resp (!) 23   Ht 5\' 3"  (1.6 m)   Wt 192 lb (87.1 kg)   SpO2 100%   BMI 34.01 kg/m   Physical Exam  Constitutional: She is oriented to person, place, and time. She appears well-developed and well-nourished. No distress.  HENT:  Head: Normocephalic and atraumatic.  Eyes: Left eye exhibits no discharge.  Cardiovascular: Normal rate and regular rhythm.   Pulmonary/Chest: Effort normal and breath sounds normal. No respiratory distress.  Abdominal: She exhibits distension. There is tenderness. There is no rebound and no guarding.  Neurological: She is alert and oriented to person, place, and time.  Skin: Skin is warm and dry.  Psychiatric: She has a normal mood and affect. Her behavior is normal.  Nursing note and vitals reviewed.   Imaging: Dg Abd Acute W/chest  Result Date: 11/19/2016 CLINICAL DATA:  71 year old female with a history of cirrhosis. Abdominal swelling. Shortness of breath. EXAM: DG ABDOMEN ACUTE W/ 1V CHEST COMPARISON:  09/02/2016 FINDINGS: Chest: Cardiomediastinal silhouette unchanged. Opacity in the right hilar region is unchanged. No pneumothorax. No pleural effusion. Ill-defined opacity at the bilateral lung bases. Gas  within stomach and small bowel without abnormal distention. Note that portions of the abdomen are excluded from the exam. Surgical changes of cholecystectomy. Soft tissue density of the abdomen. IMPRESSION: Chest: Low lung volumes with bibasilar ill-defined opacities, potentially atelectasis/ scarring, and/or consolidation. No pleural effusion. Abdomen: Nonobstructive bowel gas pattern. Electronically Signed   By: Corrie Mckusick D.O.   On: 11/19/2016 15:59   Ir Paracentesis  Result Date: 11/09/2016 INDICATION: History of NASH with recurrent abdominal ascites. Request is made for therapeutic paracentesis with a 9 L maximum. She will receive 50 g of albumin. EXAM: ULTRASOUND GUIDED THERAPEUTIC PARACENTESIS MEDICATIONS: 1% xylocaine COMPLICATIONS: None immediate. PROCEDURE: Informed written consent was obtained from the patient after a discussion of the risks, benefits and alternatives to treatment. A timeout was performed prior to the initiation of the procedure. Initial ultrasound scanning demonstrates a large amount of ascites within the right lower abdominal quadrant. The right lower abdomen was prepped and draped in the usual sterile fashion. 1% xylocaine was used for local anesthesia. Following this, a 19 gauge, 7-cm, Yueh catheter was introduced. An ultrasound image was saved for documentation purposes. The paracentesis was performed. The catheter was removed and a dressing was applied. The patient tolerated the procedure well without immediate post procedural complication. FINDINGS: A total of approximately 9 L of chylous fluid was removed. IMPRESSION: Successful ultrasound-guided paracentesis yielding 9 liters of peritoneal fluid. Read by: Saverio Danker, PA-C Electronically Signed   By: Aletta Edouard M.D.   On: 11/09/2016 10:43   Ir Paracentesis  Result Date: 10/28/2016 INDICATION: Patient with recurrent ascites. Request is made for therapeutic paracentesis of up to 9 L maximum patient to receive 50 g  albumin postprocedure. EXAM: ULTRASOUND GUIDED THERAPEUTIC PARACENTESIS MEDICATIONS: 10 mL 1% lidocaine COMPLICATIONS: None immediate. PROCEDURE: Informed written consent was obtained from the patient after a discussion of the risks, benefits and alternatives to treatment. A timeout was performed prior to the initiation of the procedure. Initial ultrasound scanning demonstrates a large amount of ascites within the right lateral abdomen. The right lateral abdomen was prepped and draped in the usual sterile fashion. 1% lidocaine was used for local anesthesia. Following this,  a 19 gauge, 7-cm, Yueh catheter was introduced. An ultrasound image was saved for documentation purposes. The paracentesis was performed. The catheter was removed and a dressing was applied. The patient tolerated the procedure well without immediate post procedural complication. FINDINGS: A total of approximately 9.0 liters of chylous fluid was removed. IMPRESSION: Successful ultrasound-guided therapeutic paracentesis yielding 9.0 liters of peritoneal fluid. Read by:  Brynda Greathouse PA-C Electronically Signed   By: Aletta Edouard M.D.   On: 10/28/2016 09:31    Labs:  CBC:  Recent Labs  08/25/16 0428 09/02/16 1740 09/03/16 0434 11/19/16 1500  WBC 6.8 8.0 6.9 7.4  HGB 12.1 12.9 12.1 11.9*  HCT 36.6 37.9 37.3 35.5*  PLT 188 224 216 260    COAGS:  Recent Labs  05/10/16 1431 06/26/16 0006 08/23/16 1729 09/02/16 1850 11/19/16 1500  INR 1.32 1.27 1.24 1.28 1.18  APTT 44* 38*  --   --  33    BMP:  Recent Labs  09/02/16 1740 09/03/16 0434 09/04/16 0428 11/19/16 1500  NA 134* 139 137 129*  K 5.5* 5.1 5.1 5.1  CL 106 109 110 97*  CO2 19* 22 19* 21*  GLUCOSE 130* 82 99 249*  BUN 57* 55* 42* 54*  CALCIUM 9.3 9.2 8.7* 8.8*  CREATININE 2.48* 2.37* 1.82* 2.62*  GFRNONAA 19* 20* 27* 17*  GFRAA 22* 23* 32* 20*    LIVER FUNCTION TESTS:  Recent Labs  08/23/16 1729 09/02/16 1740 09/03/16 0434 11/19/16 1500    BILITOT 1.5* 0.8 1.0 1.2  AST 27 30 22 27   ALT 15 19 17 23   ALKPHOS 46 43 38 72  PROT 7.3 6.4* 5.9* 6.3*  ALBUMIN 3.5 3.3* 2.8* 3.0*    TUMOR MARKERS:  Recent Labs  05/12/16 0822  AFPTM 1.6    Assessment and Plan:  71 yo female with large volume ascites.  She is uncomfortable but in no distress.  O2 saturation is 100% on 2L O2 via [place].  Mild dyspnea when speaking but not while resting.  No tachycardia.  Mildly hypotensive but asymptomatic.   Patient could have paracentesis in the am, but since I am already here at bedside, I will perform her LVP now rather than having her wait overnight.  Imaging guidance is not necessary given large volume.    Risks, benefits and alternatives reviewed.  Pt understands and desires to proceed.  Grateful for paracentesis.  In anticipation of approximately 9L draw, I will administer 50 g 25% albumin IV.   Thank you for this interesting consult.  I greatly enjoyed meeting Natalie Lane and look forward to participating in their care.  A copy of this report was sent to the requesting provider on this date.  Electronically Signed: Jacqulynn Cadet, MD 11/19/2016, 7:00 PM   I spent a total of 20 Minutes in face to face in clinical consultation, greater than 50% of which was counseling/coordinating care for symptomatic ascites.

## 2016-11-19 NOTE — ED Triage Notes (Signed)
Per ems- pt comes from home with hx of cirrhosis and ascites. Usually has paracentesis every 2 weeks, last was 9/26, scheduled for Monday. For the past few days has had more abdominal swelling than normal. Is sob, at rest. HR 80, BP 110/76, 98% RA. Lung sounds clear.

## 2016-11-19 NOTE — ED Notes (Signed)
Assisted pt off of bedside commode and placed on 2L [place] per MD Plunkett for SOB.

## 2016-11-19 NOTE — ED Notes (Signed)
Patient transported to X-ray 

## 2016-11-20 LAB — COMPREHENSIVE METABOLIC PANEL
ALBUMIN: 3.3 g/dL — AB (ref 3.5–5.0)
ALK PHOS: 58 U/L (ref 38–126)
ALT: 17 U/L (ref 14–54)
AST: 21 U/L (ref 15–41)
Anion gap: 12 (ref 5–15)
BILIRUBIN TOTAL: 1.5 mg/dL — AB (ref 0.3–1.2)
BUN: 53 mg/dL — ABNORMAL HIGH (ref 6–20)
CALCIUM: 8.9 mg/dL (ref 8.9–10.3)
CO2: 23 mmol/L (ref 22–32)
CREATININE: 2.25 mg/dL — AB (ref 0.44–1.00)
Chloride: 97 mmol/L — ABNORMAL LOW (ref 101–111)
GFR calc non Af Amer: 21 mL/min — ABNORMAL LOW (ref >60)
GFR, EST AFRICAN AMERICAN: 24 mL/min — AB (ref >60)
GLUCOSE: 59 mg/dL — AB (ref 65–99)
Potassium: 4.7 mmol/L (ref 3.5–5.1)
SODIUM: 132 mmol/L — AB (ref 135–145)
TOTAL PROTEIN: 5.8 g/dL — AB (ref 6.5–8.1)

## 2016-11-20 LAB — CBC
HCT: 34.2 % — ABNORMAL LOW (ref 36.0–46.0)
Hemoglobin: 11.4 g/dL — ABNORMAL LOW (ref 12.0–15.0)
MCH: 30.8 pg (ref 26.0–34.0)
MCHC: 33.3 g/dL (ref 30.0–36.0)
MCV: 92.4 fL (ref 78.0–100.0)
PLATELETS: 207 10*3/uL (ref 150–400)
RBC: 3.7 MIL/uL — ABNORMAL LOW (ref 3.87–5.11)
RDW: 14 % (ref 11.5–15.5)
WBC: 6 10*3/uL (ref 4.0–10.5)

## 2016-11-20 LAB — MRSA PCR SCREENING: MRSA BY PCR: NEGATIVE

## 2016-11-20 MED ORDER — FUROSEMIDE 80 MG PO TABS
80.0000 mg | ORAL_TABLET | Freq: Every day | ORAL | Status: DC
  Administered 2016-11-21: 80 mg via ORAL
  Filled 2016-11-20: qty 1

## 2016-11-20 MED ORDER — AMIODARONE HCL 100 MG PO TABS
100.0000 mg | ORAL_TABLET | Freq: Every day | ORAL | Status: DC
  Administered 2016-11-20 – 2016-11-21 (×2): 100 mg via ORAL
  Filled 2016-11-20 (×2): qty 1

## 2016-11-20 MED ORDER — SODIUM CHLORIDE 0.9 % IV BOLUS (SEPSIS)
500.0000 mL | Freq: Once | INTRAVENOUS | Status: AC
  Administered 2016-11-20: 500 mL via INTRAVENOUS

## 2016-11-20 NOTE — Plan of Care (Signed)
Problem: Fluid Volume: Goal: Ability to maintain a balanced intake and output will improve Outcome: Not Progressing Pt. Had 9L removed in the ED prior to arrival to 56m. S/p paracentesis pt.'s BP have consistently been low. Current BP 76/31 (45). MD notified. Orders just placed for a 588ml bolus.

## 2016-11-20 NOTE — Progress Notes (Addendum)
PROGRESS NOTE    Natalie Lane  YQM:[id number] DOB: 1945-05-15 DOA: 11/19/2016 PCP: Carol Ada, MD  Brief Narrative:  70 y.o. 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 Patient states this feels exactly the same as her prior visits, reports that she gets therapeutic paracenteses every 2 weeks in order to draw fluid off of her abdomen, and that usually for the first week she feels better but then the second week is difficult to get through. She had her last paracentesis on 11/10/16, and is scheduled for her next one on 11/22/16 but her home health nurse came today and did not feel that she could make it to her appointment on Monday. She gets 9L of fluid drawn off each time. She is compliant with her Lasix 80 mg twice daily and spironolactone as well as her lactulose. She has not tried anything at home for symptoms, no known aggravating factors. She reports associated hiccups, decreased appetite, cough with clear sputum production, nausea intermittently, and intermittent abdominal pain. She describes the pain is 8/10 intermittent dull and sharp nonradiating suprapubic pain with no known aggravating or alleviating factors, no treatments tried prior to arrival. She states this pain is exact same as her prior admission and the same as when she has needed to get a paracentesis. Chart review reveals her last admission was 7/19-22/18, presented with the same complaints as today, during that admission she had therapeutic paracentesis yielding 9L, her lasix regimen was adjusted and she was sent home with lasix and spironolactone which was adjusted per nephrology. She  states her nephrologist Dr. Joelyn Oms does not want anyone adjusting or changing the diuretics except for him. She follows up with Hepatologist Dr. Michail Sermon at [place], last saw him on Tuesday or Wednesday of this week (10/2 or 10/3), and he told her that she was doing well but stated her labs showed that her "kidney function was down". She has seen gastroenterology at the [facility] for EGD in the past, and has also seen [facility] Dr. Manuella Ghazi for evaluation of liver transplant (not a candidate) and consideration of TIPS procedure, last seen on 10/15/16. Her PCP is Engineering geologist at [facility]. She also follows with Dr. Martinique at [facility]. Appreciate IR help with paracentesis.  Assessment & Plan:   Active Problems:   Ascites   Cryptogenic cirrhosis with recurrent ascites  Dm afib htn with hypotension Chronic diastolic chf Hyponatremia improved Acute on chronic ckd improved  Plan PT eval.hopefully home tomorrow.advance diet.  DVT prophylaxis: none Code Status: full Family Communication:  Disposition Plan: tomorrow dc   Consultants: ir  Procedures:   Antimicrobials:   none   Subjective:  Objective: Vitals:   11/20/16 0500 11/20/16 0734 11/20/16 0800 11/20/16 1119  BP: (!) 77/41  (!) 85/60 (!) 93/54  Pulse: 77 69 73 65  Resp: 14 (!) 21 18 (!) 23  Temp:  (!) 97.4 F (36.3 C)  (!) 97.4 F (36.3 C)  TempSrc:  Oral  Oral  SpO2: 100% 99% 99% 100%  Weight:      Height:        Intake/Output Summary (Last 24 hours) at 11/20/16 1139 Last data filed at 11/20/16 5284  Gross per 24 hour  Intake           1049.5 ml  Output             9850 ml  Net          -8800.5 ml   Filed Weights   11/19/16 1448 11/19/16 2105  Weight: 87.1 kg (192 lb) 75.1 kg (165 lb 9.6 oz)    Examination:  General exam: Appears calm and comfortable  Respiratory system: Clear to auscultation. Respiratory effort normal. Cardiovascular system: S1 & S2 heard, RRR. No JVD, murmurs, rubs, gallops or clicks. No pedal  edema. Gastrointestinal system: Abdomen is less distended.bowel sounds present. Central nervous system: Alert and oriented. No focal neurological deficits. Extremities: Symmetric 5 x 5 power. Skin: No rashes, lesions or ulcers Psychiatry: Judgement and insight appear normal. Mood & affect appropriate.     Data Reviewed: I have personally reviewed following labs and imaging studies  CBC:  Recent Labs Lab 11/19/16 1500 11/20/16 0426  WBC 7.4 6.0  NEUTROABS 5.8  --   HGB 11.9* 11.4*  HCT 35.5* 34.2*  MCV 92.9 92.4  PLT 260 062   Basic Metabolic Panel:  Recent Labs Lab 11/19/16 1500 11/20/16 0426  NA 129* 132*  K 5.1 4.7  CL 97* 97*  CO2 21* 23  GLUCOSE 249* 59*  BUN 54* 53*  CREATININE 2.62* 2.25*  CALCIUM 8.8* 8.9   GFR: Estimated Creatinine Clearance: 22.6 mL/min (A) (by C-G formula based on SCr of 2.25 mg/dL (H)). Liver Function Tests:  Recent Labs Lab 11/19/16 1500 11/20/16 0426  AST 27 21  ALT 23 17  ALKPHOS 72 58  BILITOT 1.2 1.5*  PROT 6.3* 5.8*  ALBUMIN 3.0* 3.3*    Recent Labs Lab 11/19/16 1500  LIPASE 62*   No results for input(s): AMMONIA in the last 168 hours. Coagulation Profile:  Recent Labs Lab 11/19/16 1500  INR 1.18   Cardiac Enzymes: No results for input(s): CKTOTAL, CKMB, CKMBINDEX, TROPONINI in the last 168 hours. BNP (last 3 results) No results for input(s): PROBNP in the last 8760 hours. HbA1C: No results for input(s): HGBA1C in the last 72 hours. CBG: No results for input(s): GLUCAP in the last 168 hours. Lipid Profile: No results for input(s): CHOL, HDL, LDLCALC, TRIG, CHOLHDL, LDLDIRECT in the last 72 hours. Thyroid Function Tests: No results for input(s): TSH, T4TOTAL, FREET4, T3FREE, THYROIDAB in the last 72 hours. Anemia Panel: No results for input(s): VITAMINB12, FOLATE, FERRITIN, TIBC, IRON, RETICCTPCT in the last 72 hours. Sepsis Labs: No results for input(s): PROCALCITON, LATICACIDVEN in the last 168  hours.  Recent Results (from the past 240 hour(s))  MRSA PCR Screening     Status: None   Collection Time: 11/19/16  9:23 PM  Result Value Ref Range Status   MRSA by PCR NEGATIVE NEGATIVE Final    Comment:        The GeneXpert MRSA Assay (FDA approved for NASAL specimens only), is one component of a comprehensive MRSA colonization surveillance program. It is not intended to diagnose MRSA infection nor to guide or monitor treatment for MRSA infections.          Radiology Studies: US Paracentesis  Result Date: 11/20/2016 INDICATION: 71 year old female with NASH cirrhosis and recurrent large volume ascites. She came to the emergency room for discomfort. While not in acute distress, she is uncomfortable. I will perform a bedside paracentesis. EXAM: BEDSIDE PARACENTESIS MEDICATIONS: None. COMPLICATIONS: None immediate. PROCEDURE: Informed written consent was  obtained from the patient after a discussion of the risks, benefits and alternatives to treatment. A timeout was performed prior to the initiation of the procedure. Physical exam demonstrates a tense abdomen with a positive fluid wave. The right lower abdomen was prepped and draped in the usual sterile fashion. 1% lidocaine with epinephrine was used for local anesthesia. Following this, a 6 Fr Safe-T-Centesis catheter was introduced. An ultrasound image was saved for documentation purposes. The paracentesis was performed. The catheter was removed and a dressing was applied. The patient tolerated the procedure well without immediate post procedural complication. FINDINGS: A total of approximately approximately 9 L of cloudy yellow fluid was removed. IMPRESSION: Successful ultrasound-guided paracentesis yielding 9 liters of peritoneal fluid. 50 g of 25% albumin were administered intravenously. Electronically Signed   By: Jacqulynn Cadet M.D.   On: 11/20/2016 08:51   Dg Abd Acute W/chest  Result Date: 11/19/2016 CLINICAL DATA:   71 year old female with a history of cirrhosis. Abdominal swelling. Shortness of breath. EXAM: DG ABDOMEN ACUTE W/ 1V CHEST COMPARISON:  09/02/2016 FINDINGS: Chest: Cardiomediastinal silhouette unchanged. Opacity in the right hilar region is unchanged. No pneumothorax. No pleural effusion. Ill-defined opacity at the bilateral lung bases. Gas within stomach and small bowel without abnormal distention. Note that portions of the abdomen are excluded from the exam. Surgical changes of cholecystectomy. Soft tissue density of the abdomen. IMPRESSION: Chest: Low lung volumes with bibasilar ill-defined opacities, potentially atelectasis/ scarring, and/or consolidation. No pleural effusion. Abdomen: Nonobstructive bowel gas pattern. Electronically Signed   By: Corrie Mckusick D.O.   On: 11/19/2016 15:59        Scheduled Meds: . amiodarone  100 mg Oral Daily  . [START ON 11/21/2016] furosemide  80 mg Oral Daily  . spironolactone  50 mg Oral Daily   Continuous Infusions:   LOS: 1 day     Georgette Shell, MD [facility]  If 7PM-7AM, please contact night-coverage [email or web address] Password [email or web address] 11/20/2016, 11:39 AM

## 2016-11-21 MED ORDER — AMIODARONE HCL 100 MG PO TABS: 100.0000 mg | ORAL_TABLET | Freq: Every day | ORAL | 0 refills | Status: DC

## 2016-11-21 NOTE — Discharge Summary (Signed)
Physician Discharge Summary  Natalie Lane XHB:[id number] DOB: Apr 02, 1945 DOA: 11/19/2016  PCP: Carol Ada, MD  Admit date: 11/19/2016 Discharge date: 11/21/2016  Admitted From: HOME Disposition:  HOME  Recommendations for Outpatient Follow-up:  1. Follow up with PCP in 1-2 weeks dr  Carol Ada 2. 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 Patient states this feels exactly the same as her prior visits, reports that she gets therapeutic paracenteses every 2 weeks in order to draw fluid off of her abdomen, and that usually for the first week she feels better but then the second week is difficult to get through. She had her last paracentesis on 11/10/16, and is scheduled for her next one on 11/22/16 but her home health nurse came today and did not feel that she could make it to her appointment on Monday. She gets 9L of fluid drawn off each time. She is compliant with her Lasix 80 mg twice daily and spironolactone as well as her lactulose. She has not tried anything at home for symptoms, no known aggravating factors. She reports associated hiccups, decreased appetite, cough with clear sputum production, nausea intermittently, and intermittent abdominal pain. She describes the pain is 8/10 intermittent dull and sharp nonradiating suprapubic pain with no known aggravating or alleviating factors, no treatments tried prior to arrival. She states this pain is exact same as her  prior admission and the same as when she has needed to get a paracentesis. Chart review reveals her last admission was 7/19-22/18, presented with the same complaints as today, during that admission she had therapeutic paracentesis yielding 9L, her lasix regimen was adjusted and she was sent home with lasix and spironolactone which was adjusted per nephrology. She states her nephrologist Dr. Joelyn Oms does not want anyone adjusting or changing the diuretics except for him. She follows up with Hepatologist Dr. Michail Sermon at [facility], last saw him on Tuesday or Wednesday of this week (10/2 or 10/3), and he told her that she was doing well but stated her labs showed that her "kidney function was down". She has seen gastroenterology at the [facility] for EGD in the past, and has also seen [facility] Dr. Manuella Ghazi for evaluation of liver transplant (not a candidate) and consideration of TIPS procedure, last seen on 10/15/16. Her PCP is Engineering geologist at [facility]. She also follows with Dr. Martinique at [facility]. Appreciate IR help with paracentesis. Patient had paracentesis done on the day of admission. 9 L of fluid was taken out. Patient was comfortable and less short of breath after that. He has done well during this hospital stay she is ambulated with physical therapy and she is being discharged home today on the same dose of Lasix and Aldactone as she was taking at home.  Discharge Diagnoses:  Active Problems:   Ascites  Cryptogenic cirrhosis with recurrent ascites  Dm afib htn with hypotension Chronic diastolic chf Hyponatremia improved Acute on chronic ckd improved Plan to discharge home today  Discharge Instructions low-salt diet.   Allergies as of 11/21/2016      Reactions  Monosodium Glutamate Anaphylaxis   MSG   Red Dye Anaphylaxis   Erythromycin Other (See Comments)   Reaction:  Migraine    Penicillins Other (See Comments)   Reaction:  Migraine  Has patient had a PCN reaction causing immediate rash,  facial/tongue/throat swelling, SOB or lightheadedness with hypotension: No Has patient had a PCN reaction causing severe rash involving mucus membranes or skin necrosis: No Has patient had a PCN reaction that required hospitalization No Has patient had a PCN reaction occurring within the last 10 years: No If all of the above answers are "NO", then may proceed with Cephalosporin use.   Statins Other (See Comments)   Reaction:  Severe leg cramps    Sulfa Antibiotics Hives   Glipizide Rash   Iohexol Rash   Tape Rash   "caused blisters"      Medication List    TAKE these medications   amiodarone 100 MG tablet Commonly known as:  PACERONE Take 1 tablet (100 mg total) by mouth daily. What changed:  medication strength  how much to take   furosemide 80 MG tablet Commonly known as:  LASIX Take 1 tablet (80 mg total) by mouth 2 (two) times daily.   GAS-X PO Take 1 tablet by mouth daily as needed (gas).   glimepiride 2 MG tablet Commonly known as:  AMARYL Take 1 tablet (2 mg total) by mouth 2 (two) times daily before a meal.   glucose blood test strip Commonly known as:  ONE TOUCH ULTRA TEST Use as instructed to check sugar 2 times daily   Dx- E11.42   insulin NPH Human 100 UNIT/ML injection Commonly known as:  NOVOLIN N Inject 20 units twice daily before meals. What changed:  how much to take  how to take this  when to take this  additional instructions   Insulin Pen Needle 31G X 6 MM Misc Commonly known as:  CAREFINE PEN NEEDLES Use to inject insulin 2 times daily   lactulose 10 GM/15ML solution Commonly known as:  CHRONULAC Take 30 mLs (20 g total) by mouth 3 (three) times daily.   ondansetron 4 MG disintegrating tablet Commonly known as:  ZOFRAN-ODT Take 4 mg by mouth daily as needed for nausea.   ranitidine 150 MG capsule Commonly known as:  ZANTAC Take 150 mg by mouth daily as needed for heartburn.   spironolactone 50 MG tablet Commonly known as:   ALDACTONE Take 1 tablet by mouth daily.       Allergies  Allergen Reactions  . Monosodium Glutamate Anaphylaxis    MSG  . Red Dye Anaphylaxis  . Erythromycin Other (See Comments)    Reaction:  Migraine   . Penicillins Other (See Comments)    Reaction:  Migraine  Has patient had a PCN reaction causing immediate rash, facial/tongue/throat swelling, SOB or lightheadedness with hypotension: No Has patient had a PCN reaction causing severe rash involving mucus membranes or skin necrosis: No Has patient had a PCN reaction that required hospitalization No Has patient had a PCN reaction occurring within the last 10 years: No If all of the above answers are "NO", then may proceed with Cephalosporin use.  . Statins Other (See Comments)    Reaction:  Severe leg cramps   . Sulfa Antibiotics Hives  . Glipizide Rash  . Iohexol Rash  . Tape Rash    "caused blisters"    Consultations:     Procedures/Studies: US Paracentesis  Result Date: 11/20/2016 INDICATION: 71 year old female with NASH cirrhosis  and recurrent large volume ascites. She came to the emergency room for discomfort. While not in acute distress, she is uncomfortable. I will perform a bedside paracentesis. EXAM: BEDSIDE PARACENTESIS MEDICATIONS: None. COMPLICATIONS: None immediate. PROCEDURE: Informed written consent was obtained from the patient after a discussion of the risks, benefits and alternatives to treatment. A timeout was performed prior to the initiation of the procedure. Physical exam demonstrates a tense abdomen with a positive fluid wave. The right lower abdomen was prepped and draped in the usual sterile fashion. 1% lidocaine with epinephrine was used for local anesthesia. Following this, a 6 Fr Safe-T-Centesis catheter was introduced. An ultrasound image was saved for documentation purposes. The paracentesis was performed. The catheter was removed and a dressing was applied. The patient tolerated the procedure well  without immediate post procedural complication. FINDINGS: A total of approximately approximately 9 L of cloudy yellow fluid was removed. IMPRESSION: Successful ultrasound-guided paracentesis yielding 9 liters of peritoneal fluid. 50 g of 25% albumin were administered intravenously. Electronically Signed   By: Jacqulynn Cadet M.D.   On: 11/20/2016 08:51   Dg Abd Acute W/chest  Result Date: 11/19/2016 CLINICAL DATA:  71 year old female with a history of cirrhosis. Abdominal swelling. Shortness of breath. EXAM: DG ABDOMEN ACUTE W/ 1V CHEST COMPARISON:  09/02/2016 FINDINGS: Chest: Cardiomediastinal silhouette unchanged. Opacity in the right hilar region is unchanged. No pneumothorax. No pleural effusion. Ill-defined opacity at the bilateral lung bases. Gas within stomach and small bowel without abnormal distention. Note that portions of the abdomen are excluded from the exam. Surgical changes of cholecystectomy. Soft tissue density of the abdomen. IMPRESSION: Chest: Low lung volumes with bibasilar ill-defined opacities, potentially atelectasis/ scarring, and/or consolidation. No pleural effusion. Abdomen: Nonobstructive bowel gas pattern. Electronically Signed   By: Corrie Mckusick D.O.   On: 11/19/2016 15:59   Ir Paracentesis  Result Date: 11/09/2016 INDICATION: History of NASH with recurrent abdominal ascites. Request is made for therapeutic paracentesis with a 9 L maximum. She will receive 50 g of albumin. EXAM: ULTRASOUND GUIDED THERAPEUTIC PARACENTESIS MEDICATIONS: 1% xylocaine COMPLICATIONS: None immediate. PROCEDURE: Informed written consent was obtained from the patient after a discussion of the risks, benefits and alternatives to treatment. A timeout was performed prior to the initiation of the procedure. Initial ultrasound scanning demonstrates a large amount of ascites within the right lower abdominal quadrant. The right lower abdomen was prepped and draped in the usual sterile fashion. 1% xylocaine  was used for local anesthesia. Following this, a 19 gauge, 7-cm, Yueh catheter was introduced. An ultrasound image was saved for documentation purposes. The paracentesis was performed. The catheter was removed and a dressing was applied. The patient tolerated the procedure well without immediate post procedural complication. FINDINGS: A total of approximately 9 L of chylous fluid was removed. IMPRESSION: Successful ultrasound-guided paracentesis yielding 9 liters of peritoneal fluid. Read by: Saverio Danker, PA-C Electronically Signed   By: Aletta Edouard M.D.   On: 11/09/2016 10:43   Ir Paracentesis  Result Date: 10/28/2016 INDICATION: Patient with recurrent ascites. Request is made for therapeutic paracentesis of up to 9 L maximum patient to receive 50 g albumin postprocedure. EXAM: ULTRASOUND GUIDED THERAPEUTIC PARACENTESIS MEDICATIONS: 10 mL 1% lidocaine COMPLICATIONS: None immediate. PROCEDURE: Informed written consent was obtained from the patient after a discussion of the risks, benefits and alternatives to treatment. A timeout was performed prior to the initiation of the procedure. Initial ultrasound scanning demonstrates a large amount of ascites within the right lateral abdomen. The right  lateral abdomen was prepped and draped in the usual sterile fashion. 1% lidocaine was used for local anesthesia. Following this, a 19 gauge, 7-cm, Yueh catheter was introduced. An ultrasound image was saved for documentation purposes. The paracentesis was performed. The catheter was removed and a dressing was applied. The patient tolerated the procedure well without immediate post procedural complication. FINDINGS: A total of approximately 9.0 liters of chylous fluid was removed. IMPRESSION: Successful ultrasound-guided therapeutic paracentesis yielding 9.0 liters of peritoneal fluid. Read by:  Brynda Greathouse PA-C Electronically Signed   By: Aletta Edouard M.D.   On: 10/28/2016 09:31    (Echo, Carotid, EGD,  Colonoscopy, ERCP)    Subjective:   Discharge Exam: Vitals:   11/21/16 0753 11/21/16 0919  BP: (!) 97/59   Pulse: 80   Resp: 13   Temp: 97.7 F (36.5 C) 97.9 F (36.6 C)  SpO2: 95%    Vitals:   11/21/16 0600 11/21/16 0700 11/21/16 0753 11/21/16 0919  BP: (!) 84/56 (!) 97/59 (!) 97/59   Pulse: 77 81 80   Resp: 13 17 13    Temp:   97.7 F (36.5 C) 97.9 F (36.6 C)  TempSrc:   Oral Oral  SpO2: 96% 97% 95%   Weight:      Height:        General: Pt is alert, awake, not in acute distress Cardiovascular: RRR, S1/S2 +, no rubs, no gallops Respiratory: CTA bilaterally, no wheezing, no rhonchi Abdominal: Soft, NT, ND, bowel sounds + Extremities: no edema, no cyanosis    The results of significant diagnostics from this hospitalization (including imaging, microbiology, ancillary and laboratory) are listed below for reference.     Microbiology: Recent Results (from the past 240 hour(s))  MRSA PCR Screening     Status: None   Collection Time: 11/19/16  9:23 PM  Result Value Ref Range Status   MRSA by PCR NEGATIVE NEGATIVE Final    Comment:        The GeneXpert MRSA Assay (FDA approved for NASAL specimens only), is one component of a comprehensive MRSA colonization surveillance program. It is not intended to diagnose MRSA infection nor to guide or monitor treatment for MRSA infections.      Labs: BNP (last 3 results)  Recent Labs  05/10/16 1431 06/25/16 1658 11/19/16 1500  BNP 114.7* 148.4* 33.2   Basic Metabolic Panel:  Recent Labs Lab 11/19/16 1500 11/20/16 0426  NA 129* 132*  K 5.1 4.7  CL 97* 97*  CO2 21* 23  GLUCOSE 249* 59*  BUN 54* 53*  CREATININE 2.62* 2.25*  CALCIUM 8.8* 8.9   Liver Function Tests:  Recent Labs Lab 11/19/16 1500 11/20/16 0426  AST 27 21  ALT 23 17  ALKPHOS 72 58  BILITOT 1.2 1.5*  PROT 6.3* 5.8*  ALBUMIN 3.0* 3.3*    Recent Labs Lab 11/19/16 1500  LIPASE 62*   No results for input(s): AMMONIA in the  last 168 hours. CBC:  Recent Labs Lab 11/19/16 1500 11/20/16 0426  WBC 7.4 6.0  NEUTROABS 5.8  --   HGB 11.9* 11.4*  HCT 35.5* 34.2*  MCV 92.9 92.4  PLT 260 207   Cardiac Enzymes: No results for input(s): CKTOTAL, CKMB, CKMBINDEX, TROPONINI in the last 168 hours. BNP: Invalid input(s): POCBNP CBG: No results for input(s): GLUCAP in the last 168 hours. D-Dimer No results for input(s): DDIMER in the last 72 hours. Hgb A1c No results for input(s): HGBA1C in the last 72 hours. Lipid Profile  No results for input(s): CHOL, HDL, LDLCALC, TRIG, CHOLHDL, LDLDIRECT in the last 72 hours. Thyroid function studies No results for input(s): TSH, T4TOTAL, T3FREE, THYROIDAB in the last 72 hours.  Invalid input(s): FREET3 Anemia work up No results for input(s): VITAMINB12, FOLATE, FERRITIN, TIBC, IRON, RETICCTPCT in the last 72 hours. Urinalysis    Component Value Date/Time   COLORURINE YELLOW 11/19/2016 1540   APPEARANCEUR CLEAR 11/19/2016 1540   LABSPEC 1.011 11/19/2016 1540   PHURINE 5.0 11/19/2016 1540   GLUCOSEU NEGATIVE 11/19/2016 1540   HGBUR NEGATIVE 11/19/2016 1540   BILIRUBINUR NEGATIVE 11/19/2016 1540   KETONESUR NEGATIVE 11/19/2016 1540   PROTEINUR NEGATIVE 11/19/2016 1540   NITRITE NEGATIVE 11/19/2016 1540   LEUKOCYTESUR NEGATIVE 11/19/2016 1540   Sepsis Labs Invalid input(s): PROCALCITONIN,  WBC,  LACTICIDVEN Microbiology Recent Results (from the past 240 hour(s))  MRSA PCR Screening     Status: None   Collection Time: 11/19/16  9:23 PM  Result Value Ref Range Status   MRSA by PCR NEGATIVE NEGATIVE Final    Comment:        The GeneXpert MRSA Assay (FDA approved for NASAL specimens only), is one component of a comprehensive MRSA colonization surveillance program. It is not intended to diagnose MRSA infection nor to guide or monitor treatment for MRSA infections.      Time coordinating discharge: Over 30 minutes  SIGNED:   Georgette Shell,  MD  [facility] 11/21/2016, 9:54 AM If 7PM-7AM, please contact night-coverage [email or web address] Password [email or web address]

## 2016-11-21 NOTE — Evaluation (Signed)
Physical Therapy Evaluation Patient Details Name: Natalie Lane MRN: [id number] DOB: [birth year] Today's Date: 11/21/2016   History of Present Illness   Natalie Lane is a 71 y.o. 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 Get regular paracentesis every 2 weeks  Clinical Impression   Patient evaluated by Physical Therapy with no further acute PT needs identified. All education has been completed and the patient has no further questions. Overall, managing well with SPC, which is her baseline;  See below for any follow-up Physical Therapy or equipment needs. PT is signing off. Thank you for this referral.     Follow Up Recommendations No PT follow up    Equipment Recommendations  None recommended by PT    Recommendations for Other Services       Precautions / Restrictions Precautions Precautions: None Restrictions Weight Bearing Restrictions: No      Mobility  Bed Mobility                  Transfers Overall transfer level: Modified independent Equipment used: Straight cane             General transfer comment: Good use of straight cane for steadiness  Ambulation/Gait Ambulation/Gait assistance: Supervision;Modified independent (Device/Increase time) Ambulation Distance (Feet): 150 Feet Assistive device: Straight cane Gait Pattern/deviations: Step-through pattern Gait velocity: slowed Gait velocity interpretation: Below normal speed for age/gender General Gait Details: Good use of cane for third point of stability; Cues to self-monitor for activity tolerance; Supervision progressing to modified  independent  Stairs         General stair comments: We discussed options for manageing the 2 steps she has to enter her home with RW when she is alone; took time to brainstorm ideas, but ultimately a RW, folded or unfolded is awkward on steps; I prefer she use cane and rail, especially when without help  Wheelchair Mobility    Modified Rankin (Stroke Patients Only)       Balance                                             Pertinent Vitals/Pain Pain Assessment: No/denies pain    Home Living Family/patient expects to be discharged to:: Private residence Living Arrangements: Alone Available Help at Discharge: Friend(s);Available PRN/intermittently (Pt's boyfriend is currently visiting from [place]) Type of Home: Other(Comment) (Condo) Home Access: Stairs to enter Entrance Stairs-Rails: Right;Left Entrance Stairs-Number of Steps: 2 Home Layout: One level Home Equipment: Cane - single point;Grab bars - tub/shower;Bedside commode      Prior Function Level of Independence: Independent with assistive device(s)         Comments: uses SPC to get to the car; she is considering keeping her RW in the car for more steadiness with community walking     Hand Dominance   Dominant Hand: Right    Extremity/Trunk Assessment   Upper Extremity Assessment Upper Extremity Assessment: Overall WFL for tasks assessed    Lower Extremity Assessment Lower Extremity Assessment: Overall WFL for tasks assessed (for simple walking)  Communication   Communication: No difficulties  Cognition Arousal/Alertness: Awake/alert Behavior During Therapy: WFL for tasks assessed/performed Overall Cognitive Status: Within Functional Limits for tasks assessed                                        General Comments General comments (skin integrity, edema, etc.): Walked on Room Air without issue    Exercises     Assessment/Plan    PT Assessment Patent does  not need any further PT services  PT Problem List         PT Treatment Interventions      PT Goals (Current goals can be found in the Care Plan section)  Acute Rehab PT Goals Patient Stated Goal: Hopes to get home today PT Goal Formulation: All assessment and education complete, DC therapy    Frequency     Barriers to discharge        Co-evaluation               AM-PAC PT "6 Clicks" Daily Activity  Outcome Measure Difficulty turning over in bed (including adjusting bedclothes, sheets and blankets)?: None Difficulty moving from lying on back to sitting on the side of the bed? : None Difficulty sitting down on and standing up from a chair with arms (e.g., wheelchair, bedside commode, etc,.)?: None Help needed moving to and from a bed to chair (including a wheelchair)?: None Help needed walking in hospital room?: None Help needed climbing 3-5 steps with a railing? : A Little 6 Click Score: 23    End of Session   Activity Tolerance: Patient tolerated treatment well Patient left: in bed;with call bell/phone within reach;with nursing/sitter in room (sitting EOBq) Nurse Communication: Mobility status PT Visit Diagnosis: Unsteadiness on feet (R26.81);Other abnormalities of gait and mobility (R26.89)    Time: 1610-9604 PT Time Calculation (min) (ACUTE ONLY): 11 min   Charges:   PT Evaluation $PT Eval Low Complexity: 1 Low     PT G Codes:        Roney Marion, PT  Acute Rehabilitation Services Pager [phone] Office [phone]   Colletta [place] 11/21/2016, 11:40 AM

## 2016-11-22 ENCOUNTER — Ambulatory Visit (HOSPITAL_COMMUNITY)
Admission: RE | Admit: 2016-11-22 | Discharge: 2016-11-22 | Disposition: A | Discharge status: Home / Self Care | Payer: Medicare Other | Source: Ambulatory Visit | Attending: Gastroenterology | Admitting: Gastroenterology

## 2016-11-23 DIAGNOSIS — E785 Hyperlipidemia, unspecified: Secondary | ICD-10-CM | POA: Diagnosis not present

## 2016-11-23 DIAGNOSIS — I4892 Unspecified atrial flutter: Secondary | ICD-10-CM | POA: Diagnosis not present

## 2016-11-23 DIAGNOSIS — I5032 Chronic diastolic (congestive) heart failure: Secondary | ICD-10-CM | POA: Diagnosis not present

## 2016-11-23 DIAGNOSIS — E1122 Type 2 diabetes mellitus with diabetic chronic kidney disease: Secondary | ICD-10-CM | POA: Diagnosis not present

## 2016-11-23 DIAGNOSIS — N183 Chronic kidney disease, stage 3 (moderate): Secondary | ICD-10-CM | POA: Diagnosis not present

## 2016-11-23 DIAGNOSIS — K7469 Other cirrhosis of liver: Secondary | ICD-10-CM | POA: Diagnosis not present

## 2016-11-26 DIAGNOSIS — I4892 Unspecified atrial flutter: Secondary | ICD-10-CM | POA: Diagnosis not present

## 2016-11-26 DIAGNOSIS — I5032 Chronic diastolic (congestive) heart failure: Secondary | ICD-10-CM | POA: Diagnosis not present

## 2016-11-26 DIAGNOSIS — N183 Chronic kidney disease, stage 3 (moderate): Secondary | ICD-10-CM | POA: Diagnosis not present

## 2016-11-26 DIAGNOSIS — E1122 Type 2 diabetes mellitus with diabetic chronic kidney disease: Secondary | ICD-10-CM | POA: Diagnosis not present

## 2016-11-26 DIAGNOSIS — K7469 Other cirrhosis of liver: Secondary | ICD-10-CM | POA: Diagnosis not present

## 2016-11-26 DIAGNOSIS — E785 Hyperlipidemia, unspecified: Secondary | ICD-10-CM | POA: Diagnosis not present

## 2016-11-29 DIAGNOSIS — N189 Chronic kidney disease, unspecified: Secondary | ICD-10-CM | POA: Diagnosis not present

## 2016-11-29 DIAGNOSIS — I951 Orthostatic hypotension: Secondary | ICD-10-CM | POA: Diagnosis not present

## 2016-11-29 DIAGNOSIS — R188 Other ascites: Secondary | ICD-10-CM | POA: Diagnosis not present

## 2016-11-29 DIAGNOSIS — K7460 Unspecified cirrhosis of liver: Secondary | ICD-10-CM | POA: Diagnosis not present

## 2016-11-30 ENCOUNTER — Encounter (HOSPITAL_COMMUNITY): Payer: Self-pay | Admitting: Radiology

## 2016-11-30 ENCOUNTER — Ambulatory Visit (HOSPITAL_COMMUNITY)
Admission: RE | Admit: 2016-11-30 | Discharge: 2016-11-30 | Disposition: A | Discharge status: Home / Self Care | Payer: Medicare Other | Source: Ambulatory Visit | Attending: Gastroenterology | Admitting: Gastroenterology

## 2016-11-30 DIAGNOSIS — R188 Other ascites: Secondary | ICD-10-CM | POA: Insufficient documentation

## 2016-11-30 DIAGNOSIS — K7460 Unspecified cirrhosis of liver: Secondary | ICD-10-CM

## 2016-11-30 HISTORY — PX: IR PARACENTESIS: IMG2679

## 2016-11-30 MED ORDER — ALBUMIN HUMAN 25 % IV SOLN
50.0000 g | Freq: Once | INTRAVENOUS | Status: AC
  Administered 2016-11-30: 50 g via INTRAVENOUS
  Filled 2016-11-30: qty 200

## 2016-11-30 MED ORDER — LIDOCAINE HCL (PF) 1 % IJ SOLN
INTRAMUSCULAR | Status: DC | PRN
  Administered 2016-11-30: 10 mL

## 2016-11-30 MED ORDER — LIDOCAINE 2% (20 MG/ML) 5 ML SYRINGE
INTRAMUSCULAR | Status: AC
  Filled 2016-11-30: qty 10

## 2016-11-30 NOTE — Procedures (Signed)
PROCEDURE SUMMARY:  Successful US guided paracentesis from LLQ.  Yielded 9 L of milky yellow fluid.  No immediate complications.  Pt tolerated well.   Specimen was not sent for labs.  Ascencion Dike PA-C 11/30/2016 11:45 AM

## 2016-12-01 ENCOUNTER — Other Ambulatory Visit (HOSPITAL_COMMUNITY): Payer: Self-pay | Admitting: Gastroenterology

## 2016-12-01 DIAGNOSIS — K7460 Unspecified cirrhosis of liver: Secondary | ICD-10-CM

## 2016-12-01 DIAGNOSIS — K7469 Other cirrhosis of liver: Secondary | ICD-10-CM | POA: Diagnosis not present

## 2016-12-01 DIAGNOSIS — N183 Chronic kidney disease, stage 3 (moderate): Secondary | ICD-10-CM | POA: Diagnosis not present

## 2016-12-01 DIAGNOSIS — I5032 Chronic diastolic (congestive) heart failure: Secondary | ICD-10-CM | POA: Diagnosis not present

## 2016-12-01 DIAGNOSIS — E1122 Type 2 diabetes mellitus with diabetic chronic kidney disease: Secondary | ICD-10-CM | POA: Diagnosis not present

## 2016-12-01 DIAGNOSIS — I4892 Unspecified atrial flutter: Secondary | ICD-10-CM | POA: Diagnosis not present

## 2016-12-01 DIAGNOSIS — E785 Hyperlipidemia, unspecified: Secondary | ICD-10-CM | POA: Diagnosis not present

## 2016-12-01 DIAGNOSIS — R188 Other ascites: Principal | ICD-10-CM

## 2016-12-01 DIAGNOSIS — R531 Weakness: Secondary | ICD-10-CM | POA: Diagnosis not present

## 2016-12-02 ENCOUNTER — Ambulatory Visit (HOSPITAL_COMMUNITY): Payer: Medicare Other

## 2016-12-02 DIAGNOSIS — E1122 Type 2 diabetes mellitus with diabetic chronic kidney disease: Secondary | ICD-10-CM | POA: Diagnosis not present

## 2016-12-02 DIAGNOSIS — E785 Hyperlipidemia, unspecified: Secondary | ICD-10-CM | POA: Diagnosis not present

## 2016-12-02 DIAGNOSIS — K7469 Other cirrhosis of liver: Secondary | ICD-10-CM | POA: Diagnosis not present

## 2016-12-02 DIAGNOSIS — N183 Chronic kidney disease, stage 3 (moderate): Secondary | ICD-10-CM | POA: Diagnosis not present

## 2016-12-02 DIAGNOSIS — I5032 Chronic diastolic (congestive) heart failure: Secondary | ICD-10-CM | POA: Diagnosis not present

## 2016-12-02 DIAGNOSIS — I4892 Unspecified atrial flutter: Secondary | ICD-10-CM | POA: Diagnosis not present

## 2016-12-03 DIAGNOSIS — R112 Nausea with vomiting, unspecified: Secondary | ICD-10-CM | POA: Diagnosis not present

## 2016-12-04 ENCOUNTER — Emergency Department (HOSPITAL_COMMUNITY): Payer: Medicare Other

## 2016-12-04 ENCOUNTER — Encounter (HOSPITAL_COMMUNITY): Payer: Self-pay | Admitting: Emergency Medicine

## 2016-12-04 ENCOUNTER — Emergency Department (HOSPITAL_COMMUNITY)
Admission: EM | Admit: 2016-12-04 | Discharge: 2016-12-04 | Disposition: A | Discharge status: Home / Self Care | Payer: Medicare Other | Attending: Emergency Medicine | Admitting: Emergency Medicine

## 2016-12-04 DIAGNOSIS — Z87891 Personal history of nicotine dependence: Secondary | ICD-10-CM | POA: Insufficient documentation

## 2016-12-04 DIAGNOSIS — Z79899 Other long term (current) drug therapy: Secondary | ICD-10-CM | POA: Diagnosis not present

## 2016-12-04 DIAGNOSIS — I5032 Chronic diastolic (congestive) heart failure: Secondary | ICD-10-CM | POA: Insufficient documentation

## 2016-12-04 DIAGNOSIS — R1031 Right lower quadrant pain: Secondary | ICD-10-CM | POA: Diagnosis not present

## 2016-12-04 DIAGNOSIS — R109 Unspecified abdominal pain: Secondary | ICD-10-CM

## 2016-12-04 DIAGNOSIS — Z794 Long term (current) use of insulin: Secondary | ICD-10-CM | POA: Diagnosis not present

## 2016-12-04 DIAGNOSIS — E1140 Type 2 diabetes mellitus with diabetic neuropathy, unspecified: Secondary | ICD-10-CM | POA: Insufficient documentation

## 2016-12-04 DIAGNOSIS — R112 Nausea with vomiting, unspecified: Secondary | ICD-10-CM | POA: Diagnosis not present

## 2016-12-04 DIAGNOSIS — R188 Other ascites: Secondary | ICD-10-CM | POA: Insufficient documentation

## 2016-12-04 DIAGNOSIS — K7460 Unspecified cirrhosis of liver: Secondary | ICD-10-CM

## 2016-12-04 DIAGNOSIS — K2970 Gastritis, unspecified, without bleeding: Secondary | ICD-10-CM | POA: Diagnosis not present

## 2016-12-04 LAB — CBC WITH DIFFERENTIAL/PLATELET
BASOS ABS: 0 10*3/uL (ref 0.0–0.1)
Basophils Relative: 1 %
EOS ABS: 0.2 10*3/uL (ref 0.0–0.7)
Eosinophils Relative: 2 %
HCT: 38.8 % (ref 36.0–46.0)
HEMOGLOBIN: 13.1 g/dL (ref 12.0–15.0)
LYMPHS ABS: 0.8 10*3/uL (ref 0.7–4.0)
Lymphocytes Relative: 11 %
MCH: 31 pg (ref 26.0–34.0)
MCHC: 33.8 g/dL (ref 30.0–36.0)
MCV: 91.9 fL (ref 78.0–100.0)
Monocytes Absolute: 1.3 10*3/uL — ABNORMAL HIGH (ref 0.1–1.0)
Monocytes Relative: 18 %
NEUTROS PCT: 68 %
Neutro Abs: 5 10*3/uL (ref 1.7–7.7)
Platelets: 236 10*3/uL (ref 150–400)
RBC: 4.22 MIL/uL (ref 3.87–5.11)
RDW: 14 % (ref 11.5–15.5)
WBC: 7.3 10*3/uL (ref 4.0–10.5)

## 2016-12-04 LAB — COMPREHENSIVE METABOLIC PANEL
ALBUMIN: 3.5 g/dL (ref 3.5–5.0)
ALK PHOS: 79 U/L (ref 38–126)
ALT: 23 U/L (ref 14–54)
AST: 26 U/L (ref 15–41)
Anion gap: 12 (ref 5–15)
BILIRUBIN TOTAL: 1.2 mg/dL (ref 0.3–1.2)
BUN: 48 mg/dL — AB (ref 6–20)
CALCIUM: 9.4 mg/dL (ref 8.9–10.3)
CO2: 20 mmol/L — ABNORMAL LOW (ref 22–32)
Chloride: 97 mmol/L — ABNORMAL LOW (ref 101–111)
Creatinine, Ser: 2.49 mg/dL — ABNORMAL HIGH (ref 0.44–1.00)
GFR calc Af Amer: 21 mL/min — ABNORMAL LOW (ref >60)
GFR calc non Af Amer: 18 mL/min — ABNORMAL LOW (ref >60)
GLUCOSE: 168 mg/dL — AB (ref 65–99)
Potassium: 5.1 mmol/L (ref 3.5–5.1)
Sodium: 129 mmol/L — ABNORMAL LOW (ref 135–145)
TOTAL PROTEIN: 6.7 g/dL (ref 6.5–8.1)

## 2016-12-04 LAB — URINALYSIS, ROUTINE W REFLEX MICROSCOPIC
Bilirubin Urine: NEGATIVE
Glucose, UA: NEGATIVE mg/dL
KETONES UR: NEGATIVE mg/dL
Leukocytes, UA: NEGATIVE
Nitrite: NEGATIVE
Protein, ur: NEGATIVE mg/dL
SPECIFIC GRAVITY, URINE: 1.011 (ref 1.005–1.030)
pH: 5 (ref 5.0–8.0)

## 2016-12-04 MED ORDER — FENTANYL CITRATE (PF) 100 MCG/2ML IJ SOLN
12.5000 ug | Freq: Once | INTRAMUSCULAR | Status: AC
  Administered 2016-12-04: 12.5 ug via INTRAVENOUS
  Filled 2016-12-04: qty 2

## 2016-12-04 MED ORDER — SODIUM CHLORIDE 0.9 % IV BOLUS (SEPSIS)
500.0000 mL | Freq: Once | INTRAVENOUS | Status: AC
  Administered 2016-12-04: 500 mL via INTRAVENOUS

## 2016-12-04 MED ORDER — ONDANSETRON 4 MG PO TBDP: 4.0000 mg | ORAL_TABLET | Freq: Three times a day (TID) | ORAL | 0 refills | Status: DC | PRN

## 2016-12-04 MED ORDER — ONDANSETRON HCL 4 MG/2ML IJ SOLN
4.0000 mg | Freq: Once | INTRAMUSCULAR | Status: AC
  Administered 2016-12-04: 4 mg via INTRAVENOUS
  Filled 2016-12-04: qty 2

## 2016-12-04 NOTE — ED Notes (Signed)
ED Provider at bedside. 

## 2016-12-04 NOTE — ED Triage Notes (Signed)
Pt. Arrived by EMS for ascites. I have cirrhosis of the liver, its time for them to draw fluid off and Ive been sick with throwing up and really bad fluid build up.

## 2016-12-04 NOTE — ED Notes (Signed)
Patient transported to CT 

## 2016-12-04 NOTE — Discharge Instructions (Addendum)
Your urinalysis did not show evidence of UTI. It is very important that you follow up with the paracentesis on Monday.  Use tylenol as told by your doctor for pain.  Take zofran as needed for nausea or vomiting.  If your pain is not improved with the paracentesis, follow up with your primary care doctor.  Return to the ER if you develop persistent high fevers, difficulty breathing, persistent vomiting, or any new or worsening symptoms.

## 2016-12-04 NOTE — ED Provider Notes (Signed)
[facility] EMERGENCY DEPARTMENT Provider Note   CSN: [id number] Arrival date & time: 12/04/16  1327     History   Chief Complaint Chief Complaint  Patient presents with  . Abdominal Pain  . Emesis    HPI Natalie Lane is a 71 y.o. female presenting with abdominal pain, nausea, and vomiting.  Patient states that 3 days ago, she started to feel very nauseous and had an episode of vomiting.  She reports bright red blood.  She has not vomited since, however has had persistent nausea.  She also reports abdominal pain beginning 3 days ago.  The pain is located to the right lower quadrant.  It feels different than previous time she has had abdominal pain.  She has been taking tylenol without relief for pain, as instructed by her doctor.  Nothing makes it better, palpation makes it worse. She states it is nearing the time when she needs a paracentesis, has one scheduled for Monday. Son reports pt was more confused this morning, but her mental status has improved and is now at baseline. She reports several days of foul odor to her urine, but no dysuria, hematuria or urinary frequency.  She denies fevers, CP, cough, or SOB.  Reports normal bowel movements, without bright red blood or black stools.  Patient with a past medical history significant for liver cirrhosis and ascites, requiring frequent therapeutic paracentesis.    HPI  Past Medical History:  Diagnosis Date  . Anemia   . Ascites   . CHD (congenital heart disease)    a. 1979 s/p septal defect repair (? VSD vs ASD), old op notes not available;  b. 04/2016 Echo: EF 60-65%, no rwma, Gr2 DD, mild AS, mild TR, no PFO (prev repaired).  . Chronic diastolic CHF (congestive heart failure) ([place])    04/2016 Echo: EF 60-65%, no rwma, Gr2 DD.  Marland Kitchen Complication of anesthesia   . 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 04/2016 s/p paracentesis.  . Diabetes mellitus, type 2 ([place])   . Dyspnea   . Edema   . Gastric varices    a. s/p  banding.  Marland Kitchen GERD (gastroesophageal reflux disease)   . History of blood transfusion   . Hyperlipidemia   . Normal stress echocardiogram 2008  . Pancreatic adenoma   . Paroxysmal atrial flutter ([place])    a. 11/2009 s/p RFCA.  Marland Kitchen PONV (postoperative nausea and vomiting)    with Cholecystectomy  . Tobacco abuse    a. smokes 4 cigarettes/day.    Patient Active Problem List   Diagnosis Date Noted  . SOB (shortness of breath)   . Hyperkalemia 09/02/2016  . Hypotension 09/02/2016  . Ascites of liver 09/02/2016  . Pressure injury of skin 08/24/2016  . ARF (acute renal failure) ([place]) 06/26/2016  . Tobacco use 06/26/2016  . Decompensated liver disease ([place]) 06/26/2016  . Acute kidney injury ([place])   . PAT (paroxysmal atrial tachycardia) ([place]) 05/12/2016  . Ascites 05/10/2016  . Decompensated hepatic cirrhosis ([place]) 05/10/2016  . Esophageal varices ([place]) 01/23/2014  . Cirrhosis of liver with ascites ([place]) 11/16/2013  . Chest pain at rest 09/23/2011  . Heart palpitations 12/03/2010  . Atrial flutter ([place])   . CHD (congenital heart disease)   . Edema   . Hyperlipidemia   . Poorly controlled type 2 diabetes mellitus with peripheral neuropathy [facility])     Past Surgical History:  Procedure Laterality Date  . ABDOMINAL HYSTERECTOMY    . ASD versus  VSD repair  1979   Operative notes not available.  . ATRIAL ABLATION SURGERY  10 /2011  . [place]   x2  . CHOLECYSTECTOMY    . ESOPHAGOGASTRODUODENOSCOPY (EGD) WITH PROPOFOL N/A 11/16/2013   Procedure: ESOPHAGOGASTRODUODENOSCOPY (EGD) WITH PROPOFOL;  Surgeon: Lear Ng, MD;  Location: [place];  Service: Endoscopy;  Laterality: N/A;  . ESOPHAGOGASTRODUODENOSCOPY (EGD) WITH PROPOFOL N/A 01/23/2014   Procedure: ESOPHAGOGASTRODUODENOSCOPY (EGD) WITH PROPOFOL;  Surgeon: Lear Ng, MD;  Location: [facility];  Service: Endoscopy;  Laterality: N/A;  . GASTRIC VARICES BANDING N/A 11/16/2013   Procedure: GASTRIC  VARICES BANDING;  Surgeon: Lear Ng, MD;  Location: [place];  Service: Endoscopy;  Laterality: N/A;  . GASTRIC VARICES BANDING N/A 01/23/2014   Procedure: GASTRIC VARICES BANDING;  Surgeon: Lear Ng, MD;  Location: [facility];  Service: Endoscopy;  Laterality: N/A;  . HERNIA REPAIR    . IR GENERIC HISTORICAL  05/11/2016   IR PARACENTESIS 05/11/2016 Ascencion Dike, PA-C [facility]-INTERV RAD  . IR PARACENTESIS  07/08/2016  . IR PARACENTESIS  08/09/2016  . IR PARACENTESIS  08/24/2016  . IR PARACENTESIS  09/03/2016  . IR PARACENTESIS  09/16/2016  . IR PARACENTESIS  09/23/2016  . IR PARACENTESIS  10/07/2016  . IR PARACENTESIS  10/14/2016  . IR PARACENTESIS  10/27/2016  . IR PARACENTESIS  11/09/2016  . IR PARACENTESIS  11/30/2016  . OTHER SURGICAL HISTORY     partial hysterectomy  . PARACENTESIS  09/03/2016    OB History    No data available       Home Medications    Prior to Admission medications   Medication Sig Start Date End Date Taking? Authorizing Provider  amiodarone (PACERONE) 200 MG tablet Take 200 mg by mouth daily. 11/01/16  Yes [provider]  furosemide (LASIX) 80 MG tablet Take 1 tablet (80 mg total) by mouth 2 (two) times daily. 09/05/16  Yes Velvet Bathe, MD  glimepiride (AMARYL) 2 MG tablet Take 1 tablet (2 mg total) by mouth 2 (two) times daily before a meal. 11/12/16  Yes Philemon Kingdom, MD  insulin NPH Human (NOVOLIN N) 100 UNIT/ML injection Inject 20 units twice daily before meals. Patient taking differently: Inject 15-25 Units into the skin See admin instructions. Inject 25 units into the skin in the morning and 15 units into the skin at night 08/10/16  Yes Elayne Snare, MD  lactulose (CHRONULAC) 10 GM/15ML solution Take 30 mLs (20 g total) by mouth 3 (three) times daily. 09/05/16  Yes Velvet Bathe, MD  ondansetron (ZOFRAN-ODT) 4 MG disintegrating tablet Take 4 mg by mouth daily as needed for nausea. 08/31/16  Yes [provider]    ranitidine (ZANTAC) 150 MG capsule Take 150 mg by mouth daily as needed for heartburn.   Yes [provider]  Simethicone (GAS-X PO) Take 1 tablet by mouth daily as needed (gas).   Yes [provider]  spironolactone (ALDACTONE) 50 MG tablet Take 1 tablet by mouth daily. 09/15/16  Yes [provider]  amiodarone (PACERONE) 100 MG tablet Take 1 tablet (100 mg total) by mouth daily. Patient not taking: Reported on 12/04/2016 11/22/16   Georgette Shell, MD  glucose blood (ONE TOUCH ULTRA TEST) test strip Use as instructed to check sugar 2 times daily   Dx- E11.42 Patient not taking: Reported on 11/19/2016 10/04/16   Philemon Kingdom, MD  Insulin Pen Needle (CAREFINE PEN NEEDLES) 31G X 6 MM MISC Use to inject  insulin 2 times daily Patient not taking: Reported on 11/19/2016 11/16/16   Philemon Kingdom, MD  ondansetron (ZOFRAN ODT) 4 MG disintegrating tablet Take 1 tablet (4 mg total) by mouth every 8 (eight) hours as needed for nausea or vomiting. 12/04/16   Joron Velis, PA-C    Family History Family History  Problem Relation Age of Onset  . Hypertension Mother   . Kidney disease Sister   . Depression Paternal Grandfather   . Diabetes Brother     Social History Social History  Substance Use Topics  . Smoking status: Former Smoker    Packs/day: 0.50    Years: 36.00    Types: Cigarettes    Quit date: 02/16/2016  . Smokeless tobacco: Never Used     Comment: was smoking 4 cigs/day prior to admission.  . Alcohol use No     Allergies   Monosodium glutamate; Red dye; Erythromycin; Penicillins; Statins; Sulfa antibiotics; Glipizide; Iohexol; and Tape   Review of Systems Review of Systems  Constitutional: Negative for fever.  HENT: Negative for congestion and sore throat.   Respiratory: Negative for cough, chest tightness and shortness of breath.   Cardiovascular: Negative for chest pain, palpitations and leg swelling.  Gastrointestinal: Positive for  abdominal pain, nausea and vomiting (One episode, resolved).  Genitourinary: Negative for dysuria, frequency and hematuria.       Foul odor to urine  Musculoskeletal: Negative for back pain and neck pain.  Skin: Negative for wound.  Neurological: Negative for light-headedness, numbness and headaches.  Hematological: Does not bruise/bleed easily.  Psychiatric/Behavioral: Positive for confusion (Resolved).     Physical Exam Updated Vital Signs BP (!) 113/54   Pulse 79   Temp 98 F (36.7 C) (Oral)   Resp (!) 22   Ht 5\' 3"  (1.6 m)   Wt 76.2 kg (168 lb)   SpO2 95%   BMI 29.76 kg/m   Physical Exam  Constitutional: She is oriented to person, place, and time. She appears well-developed and well-nourished. No distress.  HENT:  Head: Normocephalic and atraumatic.  Eyes: EOM are normal.  Neck: Normal range of motion.  Cardiovascular: Normal rate, regular rhythm and intact distal pulses.   Pulmonary/Chest: Effort normal and breath sounds normal. No respiratory distress. She has no wheezes. She has no rales. She exhibits no tenderness.  Abdominal: Soft. Bowel sounds are normal. She exhibits distension. There is tenderness in the right lower quadrant. There is no rigidity, no guarding, no tenderness at McBurney's point and negative Murphy's sign.  His abdominal distention.  Tenderness to palpation of right lower quadrant.  No rigidity or guarding.   Musculoskeletal: Normal range of motion.  Neurological: She is alert and oriented to person, place, and time.  Skin: Skin is warm and dry.  Psychiatric: She has a normal mood and affect.  Nursing note and vitals reviewed.    ED Treatments / Results  Labs (all labs ordered are listed, but only abnormal results are displayed) Labs Reviewed  CBC WITH DIFFERENTIAL/PLATELET - Abnormal; Notable for the following:       Result Value   Monocytes Absolute 1.3 (*)    All other components within normal limits  COMPREHENSIVE METABOLIC PANEL -  Abnormal; Notable for the following:    Sodium 129 (*)    Chloride 97 (*)    CO2 20 (*)    Glucose, Bld 168 (*)    BUN 48 (*)    Creatinine, Ser 2.49 (*)    GFR calc non Af  Amer 18 (*)    GFR calc Af Amer 21 (*)    All other components within normal limits  URINALYSIS, ROUTINE W REFLEX MICROSCOPIC    EKG  EKG Interpretation None       Radiology Ct Abdomen Pelvis Wo Contrast  Result Date: 12/04/2016 CLINICAL DATA:  Abdominal distension. Cirrhosis. Diabetes. Hyperlipidemia. Prior gastric varices banding. EXAM: CT ABDOMEN AND PELVIS WITHOUT CONTRAST TECHNIQUE: Multidetector CT imaging of the abdomen and pelvis was performed following the standard protocol without IV contrast. COMPARISON:  06/25/2016 FINDINGS: Lower chest: 2 mm subpleural right lung base nodule is unchanged and most likely represents a subpleural lymph node. Bibasilar subsegmental atelectasis. Normal heart size without pericardial or pleural effusion. Multivessel coronary artery atherosclerosis. Hepatobiliary: Severe cirrhosis. Low sensitivity for focal liver lesion secondary to noncontrast technique. Cholecystectomy, without biliary ductal dilatation. Pancreas: Mild pancreatic atrophy, without dominant mass or duct dilatation. Spleen: Normal in size, without focal abnormality. Adrenals/Urinary Tract: Normal adrenal glands. Mild renal cortical thinning bilaterally. No renal calculi or hydronephrosis. No hydroureter or ureteric calculi. Decompressed urinary bladder. Stomach/Bowel: Normal stomach, without wall thickening. Normal colon and terminal ileum. Appendix not visualized. Normal small bowel caliber. Vascular/Lymphatic: Advanced aortic and branch vessel atherosclerosis. No abdominopelvic adenopathy. Reproductive: Hysterectomy.  No adnexal mass. Other: Large volume simple appearing ascites, similar to on the prior exam. No free intraperitoneal air. Musculoskeletal: Degenerate disc disease at L4-5 and L5-S1. IMPRESSION: 1.  Severe cirrhosis with large volume abdominopelvic ascites. 2. Otherwise, no acute finding. 3. Coronary artery atherosclerosis. Aortic Atherosclerosis (ICD10-I70.0). Electronically Signed   By: Abigail Miyamoto M.D.   On: 12/04/2016 20:16    Procedures Procedures (including critical care time)  Medications Ordered in ED Medications  sodium chloride 0.9 % bolus 500 mL (0 mLs Intravenous Stopped 12/04/16 2027)  fentaNYL (SUBLIMAZE) injection 12.5 mcg (12.5 mcg Intravenous Given 12/04/16 1828)  ondansetron (ZOFRAN) injection 4 mg (4 mg Intravenous Given 12/04/16 1828)  fentaNYL (SUBLIMAZE) injection 12.5 mcg (12.5 mcg Intravenous Given 12/04/16 2039)     Initial Impression / Assessment and Plan / ED Course  I have reviewed the triage vital signs and the nursing notes.  Pertinent labs & imaging results that were available during my care of the patient were reviewed by me and considered in my medical decision making (see chart for details).     Patient presenting with right lower quadrant abdominal pain times 3 days with one episode of vomiting.  Physical exam shows patient is afebrile not tachycardic.  Abdomen obviously distended with tenderness to palpation of right lower quadrant.  No peritonitis signs.  No other obvious abnormality noted.  Will order CBC, CMP, UA.  Fentanyl given for pain control, Zofran for nausea.  Gentle fluids given.  Obtain CT to assess for obstruction, infection, perforation, or other acute abnormality.  Case discussed with attending, Dr. Leonette Monarch evaluated the pt.   Basic labs reassuring.  No white count.  Hemoglobin stable.  Electrolytes stable.  Creatinine at baseline.  Pain improved with fentanyl.  Overall picture reassuring that patient does not have SBP.  CT negative for obvious infection, perforation, or obstruction.  As patient already has paracentesis planned, I do not believe what paracentesis needs to be done in the emergency room today.  Patient states that while  at CT, she had to move a lot, which caused worsening pain.  Will give second dose of fentanyl.  Urine pending.  She is signed out to [employer] PA-C for follow-up on UA, and discharge patient  either with or without antibiotics.    Final Clinical Impressions(s) / ED Diagnoses   Final diagnoses:  Acute abdominal pain  Other ascites  Cirrhosis of liver with ascites, unspecified hepatic cirrhosis type (HCC)    New Prescriptions New Prescriptions   ONDANSETRON (ZOFRAN ODT) 4 MG DISINTEGRATING TABLET    Take 1 tablet (4 mg total) by mouth every 8 (eight) hours as needed for nausea or vomiting.     Franchot Heidelberg, PA-C 12/04/16 2111    Fatima Blank, MD 12/05/16 [phone]

## 2016-12-04 NOTE — ED Provider Notes (Signed)
  Physical Exam  BP (!) 92/59   Pulse 79   Temp 98 F (36.7 C) (Oral)   Resp (!) 22   Ht 5\' 3"  (1.6 m)   Wt 76.2 kg (168 lb)   SpO2 97%   BMI 29.76 kg/m   Physical Exam  Constitutional: She appears well-developed and well-nourished. No distress.  HENT:  Head: Normocephalic and atraumatic.  Eyes: Conjunctivae and EOM are normal. No scleral icterus.  Neck: Normal range of motion.  Pulmonary/Chest: Effort normal. No respiratory distress.  Neurological: She is alert.  Skin: No rash noted. She is not diaphoretic.  Psychiatric: She has a normal mood and affect.  Nursing note and vitals reviewed.   ED Course  Procedures  MDM Care and not from previous provider. Briefly, patient presents to ED for abdominal pain, nausea and vomiting. CT scan was done to rule out appendicitis and was negative. She does have a history of ascites due to her cirrhosis. She is scheduled for paracentesis in 2 days. We'll dispo patient home after urinalysis results.  Urinalysis with no evidence of UTI. We'll discharge patient home with follow-up and refill for Zofran per previous provider.       Delia Heady, PA-C 12/04/16 2141

## 2016-12-04 NOTE — ED Notes (Signed)
Family at bedside. 

## 2016-12-06 ENCOUNTER — Encounter (HOSPITAL_COMMUNITY): Payer: Self-pay | Admitting: Student

## 2016-12-06 ENCOUNTER — Ambulatory Visit (HOSPITAL_COMMUNITY)
Admission: RE | Admit: 2016-12-06 | Discharge: 2016-12-06 | Disposition: A | Discharge status: Home / Self Care | Payer: Medicare Other | Source: Ambulatory Visit | Attending: Gastroenterology | Admitting: Gastroenterology

## 2016-12-06 DIAGNOSIS — R188 Other ascites: Secondary | ICD-10-CM | POA: Diagnosis not present

## 2016-12-06 DIAGNOSIS — K7460 Unspecified cirrhosis of liver: Secondary | ICD-10-CM

## 2016-12-06 HISTORY — PX: IR PARACENTESIS: IMG2679

## 2016-12-06 MED ORDER — ALBUMIN HUMAN 25 % IV SOLN
50.0000 g | Freq: Once | INTRAVENOUS | Status: AC
Start: 2016-12-06 — End: 2016-12-06
  Administered 2016-12-06: 50 g via INTRAVENOUS
  Filled 2016-12-06: qty 200

## 2016-12-06 MED ORDER — LIDOCAINE HCL (PF) 1 % IJ SOLN
INTRAMUSCULAR | Status: DC | PRN
  Administered 2016-12-06: 10 mL

## 2016-12-06 MED ORDER — LIDOCAINE 2% (20 MG/ML) 5 ML SYRINGE
INTRAMUSCULAR | Status: AC
  Filled 2016-12-06: qty 10

## 2016-12-06 NOTE — Procedures (Signed)
PROCEDURE SUMMARY:  Successful US guided paracentesis from right lateral abdomen.  Yielded 9.0 liters of chylous fluid.  No immediate complications.  Pt tolerated well.   Specimen was not sent for labs.  Docia Barrier PA-C 12/06/2016 3:21 PM

## 2016-12-07 DIAGNOSIS — I4892 Unspecified atrial flutter: Secondary | ICD-10-CM | POA: Diagnosis not present

## 2016-12-07 DIAGNOSIS — I5032 Chronic diastolic (congestive) heart failure: Secondary | ICD-10-CM | POA: Diagnosis not present

## 2016-12-07 DIAGNOSIS — N183 Chronic kidney disease, stage 3 (moderate): Secondary | ICD-10-CM | POA: Diagnosis not present

## 2016-12-07 DIAGNOSIS — E1122 Type 2 diabetes mellitus with diabetic chronic kidney disease: Secondary | ICD-10-CM | POA: Diagnosis not present

## 2016-12-07 DIAGNOSIS — E785 Hyperlipidemia, unspecified: Secondary | ICD-10-CM | POA: Diagnosis not present

## 2016-12-07 DIAGNOSIS — K7469 Other cirrhosis of liver: Secondary | ICD-10-CM | POA: Diagnosis not present

## 2016-12-08 ENCOUNTER — Telehealth: Payer: Self-pay | Admitting: Cardiology

## 2016-12-08 ENCOUNTER — Other Ambulatory Visit (HOSPITAL_COMMUNITY): Payer: Self-pay | Admitting: Gastroenterology

## 2016-12-08 DIAGNOSIS — K7031 Alcoholic cirrhosis of liver with ascites: Secondary | ICD-10-CM | POA: Diagnosis not present

## 2016-12-08 DIAGNOSIS — K7460 Unspecified cirrhosis of liver: Secondary | ICD-10-CM | POA: Diagnosis not present

## 2016-12-08 DIAGNOSIS — R188 Other ascites: Principal | ICD-10-CM

## 2016-12-08 NOTE — Telephone Encounter (Signed)
    Chart reviewed as part of pre-operative protocol coverage. Patient was contacted 12/08/2016 in reference to pre-operative risk assessment for pending surgery as outlined below.  Pt has been followed by Dr. Martinique for PAF, on amiodarone. Also with cirrhosis and esophageal varices, thus not on Kyle given bleed risk.   Pt needing to undergo TIPS for cirrhosis/ portal vein hypertension. Difficult to gauge for cardiac symptoms as pt has had some chest discomfort and dyspnea, which is sometimes related to ascites. She has require multiple paracentesis. She notes less frequent palpitations with amiodarone use daily. She has f/u with Dr. Martinique next month, 12/28/16. Will route encounter to him for recommendations regarding clearance. He may recommend pt wait until office visit with him before giving clearance. Surgery has not yet been scheduled.   Awaiting Dr. Doug Sou recs. Clearance pending. Pt agrees to plan.  Dr. Martinique please respond back to [place], PA-C 12/08/2016, 3:53 PM

## 2016-12-08 NOTE — Telephone Encounter (Signed)
° °  [facility] Pre-operative Risk Assessment    Request for surgical clearance:  What type of surgery is being performed? Tips   When is this surgery scheduled? Pending   Are there any medications that need to be held prior to surgery and how long?NO  1. Practice name and name of physician performing surgery? Pending   2. What is your office phone and fax number? [phone], fax no#[phone]  3. Anesthesia type (None, local, MAC, general) ? Not Sure    Romana Juniper 12/08/2016, 2:29 PM  _________________________________________________________________   (provider comments below)

## 2016-12-08 NOTE — Telephone Encounter (Signed)
She is cleared from my standpoint for TIPS procedure. Her issue is more ascites from liver failure. She does have Atrial flutter but reviewing records her HR is controlled on amiodarone  Hailey Stormer Martinique MD, [facility]

## 2016-12-10 DIAGNOSIS — I4892 Unspecified atrial flutter: Secondary | ICD-10-CM | POA: Diagnosis not present

## 2016-12-10 DIAGNOSIS — N183 Chronic kidney disease, stage 3 (moderate): Secondary | ICD-10-CM | POA: Diagnosis not present

## 2016-12-10 DIAGNOSIS — E1122 Type 2 diabetes mellitus with diabetic chronic kidney disease: Secondary | ICD-10-CM | POA: Diagnosis not present

## 2016-12-10 DIAGNOSIS — E785 Hyperlipidemia, unspecified: Secondary | ICD-10-CM | POA: Diagnosis not present

## 2016-12-10 DIAGNOSIS — I5032 Chronic diastolic (congestive) heart failure: Secondary | ICD-10-CM | POA: Diagnosis not present

## 2016-12-10 DIAGNOSIS — K7469 Other cirrhosis of liver: Secondary | ICD-10-CM | POA: Diagnosis not present

## 2016-12-10 NOTE — Telephone Encounter (Signed)
Faxed to Melissa's attention at Dr Kathline Magic office via [place].

## 2016-12-11 DIAGNOSIS — K219 Gastro-esophageal reflux disease without esophagitis: Secondary | ICD-10-CM | POA: Diagnosis not present

## 2016-12-11 DIAGNOSIS — I10 Essential (primary) hypertension: Secondary | ICD-10-CM | POA: Diagnosis not present

## 2016-12-11 DIAGNOSIS — R188 Other ascites: Secondary | ICD-10-CM | POA: Diagnosis not present

## 2016-12-11 DIAGNOSIS — N189 Chronic kidney disease, unspecified: Secondary | ICD-10-CM | POA: Diagnosis not present

## 2016-12-11 DIAGNOSIS — K7460 Unspecified cirrhosis of liver: Secondary | ICD-10-CM | POA: Diagnosis not present

## 2016-12-11 DIAGNOSIS — I4891 Unspecified atrial fibrillation: Secondary | ICD-10-CM | POA: Diagnosis not present

## 2016-12-11 DIAGNOSIS — E119 Type 2 diabetes mellitus without complications: Secondary | ICD-10-CM | POA: Diagnosis not present

## 2016-12-11 DIAGNOSIS — K7291 Hepatic failure, unspecified with coma: Secondary | ICD-10-CM | POA: Diagnosis not present

## 2016-12-11 DIAGNOSIS — I8500 Esophageal varices without bleeding: Secondary | ICD-10-CM | POA: Diagnosis not present

## 2016-12-13 ENCOUNTER — Encounter (HOSPITAL_COMMUNITY): Payer: Self-pay | Admitting: General Surgery

## 2016-12-13 ENCOUNTER — Ambulatory Visit (HOSPITAL_COMMUNITY)
Admission: RE | Admit: 2016-12-13 | Discharge: 2016-12-13 | Disposition: A | Discharge status: Home / Self Care | Source: Ambulatory Visit | Attending: Gastroenterology | Admitting: Gastroenterology

## 2016-12-13 DIAGNOSIS — K7291 Hepatic failure, unspecified with coma: Secondary | ICD-10-CM | POA: Diagnosis not present

## 2016-12-13 DIAGNOSIS — I4891 Unspecified atrial fibrillation: Secondary | ICD-10-CM | POA: Diagnosis not present

## 2016-12-13 DIAGNOSIS — K7460 Unspecified cirrhosis of liver: Secondary | ICD-10-CM | POA: Insufficient documentation

## 2016-12-13 DIAGNOSIS — I10 Essential (primary) hypertension: Secondary | ICD-10-CM | POA: Diagnosis not present

## 2016-12-13 DIAGNOSIS — K7581 Nonalcoholic steatohepatitis (NASH): Secondary | ICD-10-CM | POA: Insufficient documentation

## 2016-12-13 DIAGNOSIS — R188 Other ascites: Secondary | ICD-10-CM | POA: Insufficient documentation

## 2016-12-13 DIAGNOSIS — E119 Type 2 diabetes mellitus without complications: Secondary | ICD-10-CM | POA: Diagnosis not present

## 2016-12-13 HISTORY — PX: IR PARACENTESIS: IMG2679

## 2016-12-13 MED ORDER — LIDOCAINE HCL (PF) 1 % IJ SOLN
INTRAMUSCULAR | Status: DC | PRN
  Administered 2016-12-13: 10 mL

## 2016-12-13 MED ORDER — ALBUMIN HUMAN 25 % IV SOLN
50.0000 g | Freq: Once | INTRAVENOUS | Status: AC
Start: 2016-12-13 — End: 2016-12-13
  Administered 2016-12-13: 50 g via INTRAVENOUS
  Filled 2016-12-13: qty 200

## 2016-12-13 MED ORDER — ALBUMIN HUMAN 25 % IV SOLN
50.0000 g | Freq: Once | INTRAVENOUS | Status: DC
  Filled 2016-12-13: qty 200

## 2016-12-13 NOTE — Procedures (Signed)
Ultrasound-guided therapeutic paracentesis performed yielding 9 liters of chylous colored fluid. No immediate complications.  Shaina Gullatt E 3:51 PM 12/13/2016

## 2016-12-14 DIAGNOSIS — I10 Essential (primary) hypertension: Secondary | ICD-10-CM | POA: Diagnosis not present

## 2016-12-14 DIAGNOSIS — K7460 Unspecified cirrhosis of liver: Secondary | ICD-10-CM | POA: Diagnosis not present

## 2016-12-14 DIAGNOSIS — D649 Anemia, unspecified: Secondary | ICD-10-CM | POA: Insufficient documentation

## 2016-12-14 DIAGNOSIS — R188 Other ascites: Secondary | ICD-10-CM | POA: Diagnosis not present

## 2016-12-14 DIAGNOSIS — T4145XA Adverse effect of unspecified anesthetic, initial encounter: Secondary | ICD-10-CM | POA: Insufficient documentation

## 2016-12-14 DIAGNOSIS — Z9889 Other specified postprocedural states: Secondary | ICD-10-CM

## 2016-12-14 DIAGNOSIS — R112 Nausea with vomiting, unspecified: Secondary | ICD-10-CM | POA: Insufficient documentation

## 2016-12-14 DIAGNOSIS — E119 Type 2 diabetes mellitus without complications: Secondary | ICD-10-CM | POA: Insufficient documentation

## 2016-12-14 DIAGNOSIS — R0600 Dyspnea, unspecified: Secondary | ICD-10-CM | POA: Insufficient documentation

## 2016-12-14 DIAGNOSIS — I4892 Unspecified atrial flutter: Secondary | ICD-10-CM | POA: Insufficient documentation

## 2016-12-14 DIAGNOSIS — T8859XA Other complications of anesthesia, initial encounter: Secondary | ICD-10-CM | POA: Insufficient documentation

## 2016-12-14 DIAGNOSIS — K7469 Other cirrhosis of liver: Secondary | ICD-10-CM | POA: Insufficient documentation

## 2016-12-14 DIAGNOSIS — I5032 Chronic diastolic (congestive) heart failure: Secondary | ICD-10-CM | POA: Insufficient documentation

## 2016-12-14 DIAGNOSIS — K7291 Hepatic failure, unspecified with coma: Secondary | ICD-10-CM | POA: Diagnosis not present

## 2016-12-14 DIAGNOSIS — K219 Gastro-esophageal reflux disease without esophagitis: Secondary | ICD-10-CM | POA: Insufficient documentation

## 2016-12-14 DIAGNOSIS — D136 Benign neoplasm of pancreas: Secondary | ICD-10-CM | POA: Insufficient documentation

## 2016-12-14 DIAGNOSIS — I864 Gastric varices: Secondary | ICD-10-CM | POA: Insufficient documentation

## 2016-12-14 DIAGNOSIS — Z720 Tobacco use: Secondary | ICD-10-CM | POA: Insufficient documentation

## 2016-12-14 DIAGNOSIS — I4891 Unspecified atrial fibrillation: Secondary | ICD-10-CM | POA: Diagnosis not present

## 2016-12-14 DIAGNOSIS — Z9289 Personal history of other medical treatment: Secondary | ICD-10-CM | POA: Insufficient documentation

## 2016-12-15 DIAGNOSIS — I4891 Unspecified atrial fibrillation: Secondary | ICD-10-CM | POA: Diagnosis not present

## 2016-12-15 DIAGNOSIS — R188 Other ascites: Secondary | ICD-10-CM | POA: Diagnosis not present

## 2016-12-15 DIAGNOSIS — I10 Essential (primary) hypertension: Secondary | ICD-10-CM | POA: Diagnosis not present

## 2016-12-15 DIAGNOSIS — K7460 Unspecified cirrhosis of liver: Secondary | ICD-10-CM | POA: Diagnosis not present

## 2016-12-15 DIAGNOSIS — K7291 Hepatic failure, unspecified with coma: Secondary | ICD-10-CM | POA: Diagnosis not present

## 2016-12-15 DIAGNOSIS — E119 Type 2 diabetes mellitus without complications: Secondary | ICD-10-CM | POA: Diagnosis not present

## 2016-12-16 ENCOUNTER — Other Ambulatory Visit (HOSPITAL_COMMUNITY): Payer: Self-pay | Admitting: Gastroenterology

## 2016-12-16 DIAGNOSIS — K7460 Unspecified cirrhosis of liver: Secondary | ICD-10-CM | POA: Diagnosis not present

## 2016-12-16 DIAGNOSIS — I10 Essential (primary) hypertension: Secondary | ICD-10-CM | POA: Diagnosis not present

## 2016-12-16 DIAGNOSIS — K7291 Hepatic failure, unspecified with coma: Secondary | ICD-10-CM | POA: Diagnosis not present

## 2016-12-16 DIAGNOSIS — I4891 Unspecified atrial fibrillation: Secondary | ICD-10-CM | POA: Diagnosis not present

## 2016-12-16 DIAGNOSIS — K219 Gastro-esophageal reflux disease without esophagitis: Secondary | ICD-10-CM | POA: Diagnosis not present

## 2016-12-16 DIAGNOSIS — E119 Type 2 diabetes mellitus without complications: Secondary | ICD-10-CM | POA: Diagnosis not present

## 2016-12-16 DIAGNOSIS — I8500 Esophageal varices without bleeding: Secondary | ICD-10-CM | POA: Diagnosis not present

## 2016-12-16 DIAGNOSIS — R188 Other ascites: Secondary | ICD-10-CM | POA: Diagnosis not present

## 2016-12-16 DIAGNOSIS — N189 Chronic kidney disease, unspecified: Secondary | ICD-10-CM | POA: Diagnosis not present

## 2016-12-17 DIAGNOSIS — K7460 Unspecified cirrhosis of liver: Secondary | ICD-10-CM | POA: Diagnosis not present

## 2016-12-17 DIAGNOSIS — K7291 Hepatic failure, unspecified with coma: Secondary | ICD-10-CM | POA: Diagnosis not present

## 2016-12-17 DIAGNOSIS — I4891 Unspecified atrial fibrillation: Secondary | ICD-10-CM | POA: Diagnosis not present

## 2016-12-17 DIAGNOSIS — R188 Other ascites: Secondary | ICD-10-CM | POA: Diagnosis not present

## 2016-12-17 DIAGNOSIS — E119 Type 2 diabetes mellitus without complications: Secondary | ICD-10-CM | POA: Diagnosis not present

## 2016-12-17 DIAGNOSIS — I10 Essential (primary) hypertension: Secondary | ICD-10-CM | POA: Diagnosis not present

## 2016-12-18 DIAGNOSIS — E119 Type 2 diabetes mellitus without complications: Secondary | ICD-10-CM | POA: Diagnosis not present

## 2016-12-18 DIAGNOSIS — I4891 Unspecified atrial fibrillation: Secondary | ICD-10-CM | POA: Diagnosis not present

## 2016-12-18 DIAGNOSIS — K7460 Unspecified cirrhosis of liver: Secondary | ICD-10-CM | POA: Diagnosis not present

## 2016-12-18 DIAGNOSIS — I10 Essential (primary) hypertension: Secondary | ICD-10-CM | POA: Diagnosis not present

## 2016-12-18 DIAGNOSIS — K7291 Hepatic failure, unspecified with coma: Secondary | ICD-10-CM | POA: Diagnosis not present

## 2016-12-18 DIAGNOSIS — R188 Other ascites: Secondary | ICD-10-CM | POA: Diagnosis not present

## 2016-12-20 ENCOUNTER — Ambulatory Visit (HOSPITAL_COMMUNITY): Payer: Medicare Other

## 2016-12-20 DIAGNOSIS — K7460 Unspecified cirrhosis of liver: Secondary | ICD-10-CM | POA: Diagnosis not present

## 2016-12-20 DIAGNOSIS — I10 Essential (primary) hypertension: Secondary | ICD-10-CM | POA: Diagnosis not present

## 2016-12-20 DIAGNOSIS — R188 Other ascites: Secondary | ICD-10-CM | POA: Diagnosis not present

## 2016-12-20 DIAGNOSIS — K7291 Hepatic failure, unspecified with coma: Secondary | ICD-10-CM | POA: Diagnosis not present

## 2016-12-20 DIAGNOSIS — I4891 Unspecified atrial fibrillation: Secondary | ICD-10-CM | POA: Diagnosis not present

## 2016-12-20 DIAGNOSIS — E119 Type 2 diabetes mellitus without complications: Secondary | ICD-10-CM | POA: Diagnosis not present

## 2016-12-23 ENCOUNTER — Encounter (HOSPITAL_COMMUNITY): Payer: Self-pay | Admitting: Student

## 2016-12-23 ENCOUNTER — Ambulatory Visit (HOSPITAL_COMMUNITY)
Admission: RE | Admit: 2016-12-23 | Discharge: 2016-12-23 | Disposition: A | Discharge status: Home / Self Care | Source: Ambulatory Visit | Attending: Gastroenterology | Admitting: Gastroenterology

## 2016-12-23 DIAGNOSIS — K7460 Unspecified cirrhosis of liver: Secondary | ICD-10-CM

## 2016-12-23 DIAGNOSIS — R188 Other ascites: Secondary | ICD-10-CM | POA: Insufficient documentation

## 2016-12-23 DIAGNOSIS — I10 Essential (primary) hypertension: Secondary | ICD-10-CM | POA: Diagnosis not present

## 2016-12-23 DIAGNOSIS — I4891 Unspecified atrial fibrillation: Secondary | ICD-10-CM | POA: Diagnosis not present

## 2016-12-23 DIAGNOSIS — K7291 Hepatic failure, unspecified with coma: Secondary | ICD-10-CM | POA: Diagnosis not present

## 2016-12-23 DIAGNOSIS — E119 Type 2 diabetes mellitus without complications: Secondary | ICD-10-CM | POA: Diagnosis not present

## 2016-12-23 HISTORY — PX: IR PARACENTESIS: IMG2679

## 2016-12-23 MED ORDER — LIDOCAINE 2% (20 MG/ML) 5 ML SYRINGE
INTRAMUSCULAR | Status: AC
  Filled 2016-12-23: qty 10

## 2016-12-23 MED ORDER — LIDOCAINE HCL (PF) 1 % IJ SOLN
INTRAMUSCULAR | Status: DC | PRN
  Administered 2016-12-23: 10 mL

## 2016-12-23 MED ORDER — ALBUMIN HUMAN 25 % IV SOLN
50.0000 g | Freq: Once | INTRAVENOUS | Status: AC
  Administered 2016-12-23: 50 g via INTRAVENOUS
  Filled 2016-12-23: qty 200

## 2016-12-23 NOTE — Procedures (Signed)
PROCEDURE SUMMARY:  Successful US guided paracentesis from right lateral abodmen.  Yielded 9.0 liters of chylous fluid.  No immediate complications.  Pt tolerated well.   Specimen was not sent for labs. Patient received 50g albumin post-procedure.  Docia Barrier PA-C 12/23/2016 2:12 PM

## 2016-12-24 DIAGNOSIS — I10 Essential (primary) hypertension: Secondary | ICD-10-CM | POA: Diagnosis not present

## 2016-12-24 DIAGNOSIS — K7460 Unspecified cirrhosis of liver: Secondary | ICD-10-CM | POA: Diagnosis not present

## 2016-12-24 DIAGNOSIS — K7291 Hepatic failure, unspecified with coma: Secondary | ICD-10-CM | POA: Diagnosis not present

## 2016-12-24 DIAGNOSIS — I4891 Unspecified atrial fibrillation: Secondary | ICD-10-CM | POA: Diagnosis not present

## 2016-12-24 DIAGNOSIS — R188 Other ascites: Secondary | ICD-10-CM | POA: Diagnosis not present

## 2016-12-24 DIAGNOSIS — E119 Type 2 diabetes mellitus without complications: Secondary | ICD-10-CM | POA: Diagnosis not present

## 2016-12-28 ENCOUNTER — Ambulatory Visit: Payer: Medicare Other | Admitting: Cardiology

## 2016-12-29 ENCOUNTER — Other Ambulatory Visit (HOSPITAL_COMMUNITY): Payer: Self-pay | Admitting: Gastroenterology

## 2016-12-29 DIAGNOSIS — K7460 Unspecified cirrhosis of liver: Secondary | ICD-10-CM

## 2016-12-29 DIAGNOSIS — R188 Other ascites: Principal | ICD-10-CM

## 2016-12-30 ENCOUNTER — Ambulatory Visit (HOSPITAL_COMMUNITY)
Admission: RE | Admit: 2016-12-30 | Discharge: 2016-12-30 | Disposition: A | Discharge status: Home / Self Care | Source: Ambulatory Visit | Attending: Gastroenterology | Admitting: Gastroenterology

## 2016-12-30 ENCOUNTER — Encounter (HOSPITAL_COMMUNITY): Payer: Self-pay | Admitting: Student

## 2016-12-30 DIAGNOSIS — K7460 Unspecified cirrhosis of liver: Secondary | ICD-10-CM

## 2016-12-30 DIAGNOSIS — R188 Other ascites: Secondary | ICD-10-CM | POA: Diagnosis not present

## 2016-12-30 HISTORY — PX: IR PARACENTESIS: IMG2679

## 2016-12-30 MED ORDER — LIDOCAINE HCL (PF) 1 % IJ SOLN
INTRAMUSCULAR | Status: DC | PRN
  Administered 2016-12-30: 10 mL

## 2016-12-30 MED ORDER — SODIUM CHLORIDE 0.9 % IV BOLUS (SEPSIS)
500.0000 mL | Freq: Once | INTRAVENOUS | Status: AC
  Administered 2016-12-30: 500 mL via INTRAVENOUS

## 2016-12-30 MED ORDER — LIDOCAINE 2% (20 MG/ML) 5 ML SYRINGE
INTRAMUSCULAR | Status: AC
  Filled 2016-12-30: qty 10

## 2016-12-30 MED ORDER — ALBUMIN HUMAN 25 % IV SOLN
50.0000 g | Freq: Once | INTRAVENOUS | Status: AC
  Administered 2016-12-30: 50 g via INTRAVENOUS
  Filled 2016-12-30: qty 200

## 2016-12-30 NOTE — Procedures (Signed)
PROCEDURE SUMMARY:  Successful US guided paracentesis from right lateral abdomen.  Yielded 5.7 liters of chylous fluid.  Patient did not tolerate procedure well.  Developed hypotension and was given a 500 mL fluid bolus. Her symptoms and BP improved and she felt well for discharge.  Specimen was not sent for labs.  Docia Barrier PA-C 12/30/2016 2:36 PM

## 2016-12-31 DIAGNOSIS — K7291 Hepatic failure, unspecified with coma: Secondary | ICD-10-CM | POA: Diagnosis not present

## 2016-12-31 DIAGNOSIS — I4891 Unspecified atrial fibrillation: Secondary | ICD-10-CM | POA: Diagnosis not present

## 2016-12-31 DIAGNOSIS — K7460 Unspecified cirrhosis of liver: Secondary | ICD-10-CM | POA: Diagnosis not present

## 2016-12-31 DIAGNOSIS — E119 Type 2 diabetes mellitus without complications: Secondary | ICD-10-CM | POA: Diagnosis not present

## 2016-12-31 DIAGNOSIS — I10 Essential (primary) hypertension: Secondary | ICD-10-CM | POA: Diagnosis not present

## 2016-12-31 DIAGNOSIS — R188 Other ascites: Secondary | ICD-10-CM | POA: Diagnosis not present

## 2017-01-01 NOTE — Progress Notes (Deleted)
CARDIOLOGY OFFICE NOTE  Date:  01/01/2017    Natalie Lane Date of Birth: December 30, 1945 Medical Record #[id number]  PCP:  Carol Ada, MD  Cardiologist:  Kepler Mccabe [place]  MD  No chief complaint on file.   History of Present Illness: Natalie Lane is a 71 y.o. female who presents today for follow up PAT and prior VSD repair  She has a remote history of atrial flutter s/p RF catheter ablation in 2011. Marland Kitchen She is s/p probable VSD repair as a child. She had a normal stress Echo in the past. Echo in 2013 showed no shunt and normal LV/RV function.  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 She is s/p banding. Due to her varices she was switched from metoprolol to propranolol.  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 She has recurrent ascites managed by Dr. Michail Sermon. She was admitted in March with respiratory failure, massive ascites. She was tachycardic with some PAT. She underwent large volume paracentesis with improvement. Continued on propranolol. Diuretics increased. Not a candidate for anticoagulation due to varices.   Admitted again in May 2018 with decompensated hepatic cirrhosis, ascites, and PAT. BP too low to titrate beta blocker or CCB. On metoprolol 25 mg daily. Started on amiodarone as last resort. Had repeat paracentesis.   She was admitted twice in July 2018 with increased ascites and worsening renal function. She had large volume paracentesis on both admissions. Seen by Nephrology and diuretics adjusted. She was maintaining NSR during those admissions. She had repeat EGD at [facility] which was benign but she broke 2 teeth during the procedure.   Last admitted 10/5-10/7/18 with increased abdominal girth and SOB. Underwent repeat large volume paracentesis with improvement. She was in NSR. She has been evaluated by Dr Manuella Ghazi at [facility] for transplant evaluation and is considered for possible TIPS procedure.    She notes her heart racing a lot 2-3 days ago. She has not been taking her amiodarone daily due to low BP. She is going to been seen in [place] at the end of this month for consideration of a liver transplant.    Past Medical History:  Diagnosis Date  . Anemia   . Ascites   . CHD (congenital heart disease)    a. 1979 s/p septal defect repair (? VSD vs ASD), old op notes not available;  b. 04/2016 Echo: EF 60-65%, no rwma, Gr2 DD, mild AS, mild TR, no PFO (prev repaired).  . Chronic diastolic CHF (congestive heart failure) ([place])    04/2016 Echo: EF 60-65%, no rwma, Gr2 DD.  Marland Kitchen Complication of anesthesia   . Cryptogenic cirrhosis ([place])    a. 04/2016 s/p paracentesis.  . Diabetes mellitus, type 2 ([place])   . Dyspnea   . Edema   . Gastric varices    a. s/p banding.  Marland Kitchen GERD (gastroesophageal reflux disease)   . History of blood transfusion   . Hyperlipidemia   . Normal stress echocardiogram 2008  . Pancreatic adenoma   . Paroxysmal atrial flutter ([place])    a. 11/2009 s/p RFCA.  Marland Kitchen PONV (postoperative nausea and vomiting)    with Cholecystectomy  . Tobacco abuse    a. smokes 4 cigarettes/day.    Past Surgical History:  Procedure Laterality Date  . ABDOMINAL HYSTERECTOMY    . ASD versus VSD repair  1979   Operative notes not available.  . ATRIAL ABLATION SURGERY  10 /2011  . [place]   x2  . CHOLECYSTECTOMY    . ESOPHAGOGASTRODUODENOSCOPY (EGD) WITH PROPOFOL N/A 01/23/2014   Performed by Lear Ng., MD at [facility] ENDOSCOPY  . ESOPHAGOGASTRODUODENOSCOPY (EGD) WITH PROPOFOL N/A 11/16/2013   Performed by Lear Ng., MD at [facility] ENDOSCOPY  . GASTRIC VARICES BANDING N/A 01/23/2014   Performed by Lear Ng., MD at [facility] ENDOSCOPY  . GASTRIC VARICES BANDING N/A 11/16/2013   Performed by Lear Ng., MD at [facility] ENDOSCOPY  . HERNIA REPAIR    . IR GENERIC HISTORICAL  05/11/2016   IR PARACENTESIS 05/11/2016 Ascencion Dike, PA-C [facility]-INTERV RAD  . IR  PARACENTESIS  07/08/2016  . IR PARACENTESIS  08/09/2016  . IR PARACENTESIS  08/24/2016  . IR PARACENTESIS  09/03/2016  . IR PARACENTESIS  09/16/2016  . IR PARACENTESIS  09/23/2016  . IR PARACENTESIS  10/07/2016  . IR PARACENTESIS  10/14/2016  . IR PARACENTESIS  10/27/2016  . IR PARACENTESIS  11/09/2016  . IR PARACENTESIS  11/30/2016  . IR PARACENTESIS  12/06/2016  . IR PARACENTESIS  12/13/2016  . IR PARACENTESIS  12/23/2016  . IR PARACENTESIS  12/30/2016  . OTHER SURGICAL HISTORY     partial hysterectomy  . PARACENTESIS  09/03/2016    Allergies as of 01/04/2017      Reactions   Monosodium Glutamate Anaphylaxis   MSG   Red Dye Anaphylaxis   Erythromycin Other (See Comments)   Reaction:  Migraine    Penicillins Other (See Comments)   Reaction:  Migraine  Has patient had a PCN reaction causing immediate rash, facial/tongue/throat swelling, SOB or lightheadedness with hypotension: No Has patient had a PCN reaction causing severe rash involving mucus membranes or skin necrosis: No Has patient had a PCN reaction that required hospitalization No Has patient had a PCN reaction occurring within the last 10 years: No If all of the above answers are "NO", then may proceed with Cephalosporin use.   Statins Other (See Comments)   Reaction:  Severe leg cramps    Sulfa Antibiotics Hives   Glipizide Rash   Iohexol Rash   Tape Rash   "caused blisters"      Medication List        Accurate as of 01/01/17  8:27 AM. Always use your most recent med list.          amiodarone 200 MG tablet Commonly known as:  PACERONE Take 200 mg by mouth daily.   amiodarone 100 MG tablet Commonly known as:  PACERONE Take 1 tablet (100 mg total) by mouth daily.   furosemide 80 MG tablet Commonly known as:  LASIX Take 1 tablet (80 mg total) by mouth 2 (two) times daily.   GAS-X PO Take 1 tablet by mouth daily as needed (gas).   glimepiride 2 MG tablet Commonly known as:  AMARYL Take 1 tablet (2 mg  total) by mouth 2 (two) times daily before a meal.   glucose blood test strip Commonly known as:  ONE TOUCH ULTRA TEST Use as instructed to check sugar 2 times daily   Dx- E11.42   insulin NPH Human 100 UNIT/ML injection Commonly known as:  NOVOLIN N Inject 20 units twice daily before meals.   Insulin Pen Needle 31G X 6 MM Misc Commonly known as:  CAREFINE PEN NEEDLES Use to inject insulin 2 times daily   lactulose 10 GM/15ML solution Commonly known as:  CHRONULAC Take 30 mLs (20 g total) by  mouth 3 (three) times daily.   ondansetron 4 MG disintegrating tablet Commonly known as:  ZOFRAN-ODT Take 4 mg by mouth daily as needed for nausea.   ondansetron 4 MG disintegrating tablet Commonly known as:  ZOFRAN ODT Take 1 tablet (4 mg total) by mouth every 8 (eight) hours as needed for nausea or vomiting.   ranitidine 150 MG capsule Commonly known as:  ZANTAC Take 150 mg by mouth daily as needed for heartburn.   spironolactone 50 MG tablet Commonly known as:  ALDACTONE Take 1 tablet by mouth daily.       Allergies: Allergies  Allergen Reactions  . Monosodium Glutamate Anaphylaxis    MSG  . Red Dye Anaphylaxis  . Erythromycin Other (See Comments)    Reaction:  Migraine   . Penicillins Other (See Comments)    Reaction:  Migraine  Has patient had a PCN reaction causing immediate rash, facial/tongue/throat swelling, SOB or lightheadedness with hypotension: No Has patient had a PCN reaction causing severe rash involving mucus membranes or skin necrosis: No Has patient had a PCN reaction that required hospitalization No Has patient had a PCN reaction occurring within the last 10 years: No If all of the above answers are "NO", then may proceed with Cephalosporin use.  . Statins Other (See Comments)    Reaction:  Severe leg cramps   . Sulfa Antibiotics Hives  . Glipizide Rash  . Iohexol Rash  . Tape Rash    "caused blisters"    Social History: The patient  reports that  she quit smoking about 10 months ago. Her smoking use included cigarettes. She has a 18.00 pack-year smoking history. she has never used smokeless tobacco. She reports that she does not drink alcohol or use drugs.   Family History: The patient's family history includes Depression in her paternal grandfather; Diabetes in her brother; Hypertension in her mother; Kidney disease in her sister.   Review of Systems: Please see the history of present illness.   Otherwise, the review of systems is positive for none.   All other systems are reviewed and negative.   Physical Exam: VS:  There were no vitals taken for this visit. Marland Kitchen  BMI There is no height or weight on file to calculate BMI.  Wt Readings from Last 3 Encounters:  12/04/16 168 lb (76.2 kg)  11/19/16 165 lb 9.6 oz (75.1 kg)  11/12/16 172 lb (78 kg)    General: Pleasant. Well developed, well nourished and in no acute distress.   HEENT: Normal.  Neck: Supple, no JVD, carotid bruits, or masses noted.  Cardiac: Regular rate and rhythm. 2/6 outflow murmur noted.  No edema.  Respiratory:  Lungs are clear to auscultation bilaterally with normal work of breathing.  GI: Soft with moderate amount of ascites. soft MS: No deformity or atrophy. Gait and ROM intact.  Skin: Warm and dry. Color is normal.  Neuro:  Strength and sensation are intact and no gross focal deficits noted.  Psych: Alert, appropriate and with normal affect.   LABORATORY DATA:  EKG:  EKG is not ordered today.  Lab Results  Component Value Date   WBC 7.3 12/04/2016   HGB 13.1 12/04/2016   HCT 38.8 12/04/2016   PLT 236 12/04/2016   GLUCOSE 168 (H) 12/04/2016   CHOL 287 (A) 02/28/2014   TRIG 148 02/28/2014   HDL 62 02/28/2014   LDLCALC 196 02/28/2014   ALT 23 12/04/2016   AST 26 12/04/2016   NA 129 (L) 12/04/2016  K 5.1 12/04/2016   CL 97 (L) 12/04/2016   CREATININE 2.49 (H) 12/04/2016   BUN 48 (H) 12/04/2016   CO2 20 (L) 12/04/2016   TSH 1.799 06/29/2016    INR 1.18 11/19/2016   HGBA1C 6.6 11/12/2016   MICROALBUR see comment  05/24/2016    BNP (last 3 results) Recent Labs    05/10/16 1431 06/25/16 1658 11/19/16 1500  BNP 114.7* 148.4* 92.4    ProBNP (last 3 results) No results for input(s): PROBNP in the last 8760 hours.   Other Studies Reviewed Today:  Echo Study Conclusions 05/12/16: Study Conclusions  - Left ventricle: The cavity size was normal. Wall thickness was   increased in a pattern of mild LVH. Systolic function was normal.   The estimated ejection fraction was in the range of 60% to 65%.   Wall motion was normal; there were no regional wall motion   abnormalities. Doppler parameters are consistent with   pseudonormal left ventricular relaxation (grade 2 diastolic   dysfunction). The E/e&' ratio is >15, suggesting elevated LV   filling pressure. - Ventricular septum: No residual VSD. - Aortic valve: Trileaflet; mildly calcified leaflets. Mild   stenosis. Mean gradient (S): 10 mm Hg. Peak gradient (S): 18 mm   Hg. Valve area (VTI): 1.12 cm^2. Valve area (Vmax): 1.21 cm^2.   Valve area (Vmean): 1.05 cm^2. - Mitral valve: Calcified annulus. Mildly thickened leaflets .   Valve area by pressure half-time: 2.22 cm^2. Valve area by   continuity equation (using LVOT flow): 1.15 cm^2. - Left atrium: The atrium was at the upper limits of normal in   size. - Right atrium: The atrium was at the upper limits of normal in   size. - Atrial septum: No defect or patent foramen ovale was identified   (previously repaired). - Tricuspid valve: There was mild regurgitation. - Pulmonary arteries: PA peak pressure: 23 mm Hg (S). - Inferior vena cava: The vessel was normal in size. The   respirophasic diameter changes were in the normal range (>= 50%),   consistent with normal central venous pressure.  Impressions:  - Compared to a prior study in 2017, there are no significant   changes.  Assessment/Plan: 1. Remote VSD  repair. No shunt by last Echo  2. History of atrial flutter/PAT. More sustained arrhythmia due to ascites and respiratory failure. Unable to tolerate AV nodal agents due to hypotension. Now on amiodarone. Not ideal in setting of cirrhosis but our only option. I have recommended she take it daily since it should not affect her BP.  She is not a candidate for anticoagulation due to cirrhosis and gastric varices.   3. Cirrhosis with gastric varices and recurrent ascites. S/p large volume paracentesis. Awaiting evaluation for possible liver transplant.   4. Pancreatic ampulla and duodenal adenoma.   5. DM  Current medicines are reviewed with the patient today.  The patient does not have concerns regarding medicines other than what has been noted above.  The following changes have been made:  See above.  Labs/ tests ordered today include:    No orders of the defined types were placed in this encounter.    Signed: Yaritzel Stange [place] MD, [facility]  01/01/2017 8:27 AM  [place]

## 2017-01-02 DIAGNOSIS — K7460 Unspecified cirrhosis of liver: Secondary | ICD-10-CM | POA: Diagnosis not present

## 2017-01-02 DIAGNOSIS — R188 Other ascites: Secondary | ICD-10-CM | POA: Diagnosis not present

## 2017-01-02 DIAGNOSIS — I4891 Unspecified atrial fibrillation: Secondary | ICD-10-CM | POA: Diagnosis not present

## 2017-01-02 DIAGNOSIS — I10 Essential (primary) hypertension: Secondary | ICD-10-CM | POA: Diagnosis not present

## 2017-01-02 DIAGNOSIS — E119 Type 2 diabetes mellitus without complications: Secondary | ICD-10-CM | POA: Diagnosis not present

## 2017-01-02 DIAGNOSIS — K7291 Hepatic failure, unspecified with coma: Secondary | ICD-10-CM | POA: Diagnosis not present

## 2017-01-04 ENCOUNTER — Ambulatory Visit: Payer: Medicare Other | Admitting: Cardiology

## 2017-01-09 DIAGNOSIS — I4891 Unspecified atrial fibrillation: Secondary | ICD-10-CM | POA: Diagnosis not present

## 2017-01-09 DIAGNOSIS — K7460 Unspecified cirrhosis of liver: Secondary | ICD-10-CM | POA: Diagnosis not present

## 2017-01-09 DIAGNOSIS — I10 Essential (primary) hypertension: Secondary | ICD-10-CM | POA: Diagnosis not present

## 2017-01-09 DIAGNOSIS — R188 Other ascites: Secondary | ICD-10-CM | POA: Diagnosis not present

## 2017-01-09 DIAGNOSIS — E119 Type 2 diabetes mellitus without complications: Secondary | ICD-10-CM | POA: Diagnosis not present

## 2017-01-09 DIAGNOSIS — K7291 Hepatic failure, unspecified with coma: Secondary | ICD-10-CM | POA: Diagnosis not present

## 2017-01-10 ENCOUNTER — Other Ambulatory Visit (HOSPITAL_COMMUNITY): Payer: Self-pay | Admitting: Gastroenterology

## 2017-01-10 DIAGNOSIS — K7031 Alcoholic cirrhosis of liver with ascites: Secondary | ICD-10-CM

## 2017-01-11 DIAGNOSIS — K7460 Unspecified cirrhosis of liver: Secondary | ICD-10-CM | POA: Diagnosis not present

## 2017-01-11 DIAGNOSIS — I10 Essential (primary) hypertension: Secondary | ICD-10-CM | POA: Diagnosis not present

## 2017-01-11 DIAGNOSIS — R188 Other ascites: Secondary | ICD-10-CM | POA: Diagnosis not present

## 2017-01-11 DIAGNOSIS — K7291 Hepatic failure, unspecified with coma: Secondary | ICD-10-CM | POA: Diagnosis not present

## 2017-01-11 DIAGNOSIS — I4891 Unspecified atrial fibrillation: Secondary | ICD-10-CM | POA: Diagnosis not present

## 2017-01-11 DIAGNOSIS — E119 Type 2 diabetes mellitus without complications: Secondary | ICD-10-CM | POA: Diagnosis not present

## 2017-01-12 ENCOUNTER — Encounter (HOSPITAL_COMMUNITY): Payer: Self-pay | Admitting: Radiology

## 2017-01-12 ENCOUNTER — Ambulatory Visit (HOSPITAL_COMMUNITY)
Admission: RE | Admit: 2017-01-12 | Discharge: 2017-01-12 | Disposition: A | Discharge status: Home / Self Care | Source: Ambulatory Visit | Attending: Gastroenterology | Admitting: Gastroenterology

## 2017-01-12 DIAGNOSIS — K7460 Unspecified cirrhosis of liver: Secondary | ICD-10-CM | POA: Diagnosis not present

## 2017-01-12 DIAGNOSIS — K7031 Alcoholic cirrhosis of liver with ascites: Secondary | ICD-10-CM

## 2017-01-12 DIAGNOSIS — I4891 Unspecified atrial fibrillation: Secondary | ICD-10-CM | POA: Diagnosis not present

## 2017-01-12 DIAGNOSIS — R188 Other ascites: Secondary | ICD-10-CM | POA: Insufficient documentation

## 2017-01-12 DIAGNOSIS — K7291 Hepatic failure, unspecified with coma: Secondary | ICD-10-CM | POA: Diagnosis not present

## 2017-01-12 DIAGNOSIS — E119 Type 2 diabetes mellitus without complications: Secondary | ICD-10-CM | POA: Diagnosis not present

## 2017-01-12 DIAGNOSIS — I10 Essential (primary) hypertension: Secondary | ICD-10-CM | POA: Diagnosis not present

## 2017-01-12 HISTORY — PX: IR PARACENTESIS: IMG2679

## 2017-01-12 MED ORDER — ALBUMIN HUMAN 25 % IV SOLN
50.0000 g | Freq: Once | INTRAVENOUS | Status: AC
  Administered 2017-01-12: 50 g via INTRAVENOUS
  Filled 2017-01-12 (×2): qty 200

## 2017-01-12 MED ORDER — LIDOCAINE 2% (20 MG/ML) 5 ML SYRINGE
INTRAMUSCULAR | Status: AC
  Filled 2017-01-12: qty 10

## 2017-01-12 MED ORDER — LIDOCAINE HCL (PF) 1 % IJ SOLN
INTRAMUSCULAR | Status: DC | PRN
  Administered 2017-01-12: 10 mL

## 2017-01-12 NOTE — Procedures (Signed)
PROCEDURE SUMMARY:  Successful US guided paracentesis from RLQ.  Yielded 9 L of milky yellow fluid.  No immediate complications.  Pt tolerated well.   Specimen was not sent for labs.  Ascencion Dike PA-C 01/12/2017 11:50 AM

## 2017-01-15 DIAGNOSIS — K219 Gastro-esophageal reflux disease without esophagitis: Secondary | ICD-10-CM | POA: Diagnosis not present

## 2017-01-15 DIAGNOSIS — K7291 Hepatic failure, unspecified with coma: Secondary | ICD-10-CM | POA: Diagnosis not present

## 2017-01-15 DIAGNOSIS — I8500 Esophageal varices without bleeding: Secondary | ICD-10-CM | POA: Diagnosis not present

## 2017-01-15 DIAGNOSIS — R188 Other ascites: Secondary | ICD-10-CM | POA: Diagnosis not present

## 2017-01-15 DIAGNOSIS — E119 Type 2 diabetes mellitus without complications: Secondary | ICD-10-CM | POA: Diagnosis not present

## 2017-01-15 DIAGNOSIS — K7460 Unspecified cirrhosis of liver: Secondary | ICD-10-CM | POA: Diagnosis not present

## 2017-01-15 DIAGNOSIS — I4891 Unspecified atrial fibrillation: Secondary | ICD-10-CM | POA: Diagnosis not present

## 2017-01-15 DIAGNOSIS — N189 Chronic kidney disease, unspecified: Secondary | ICD-10-CM | POA: Diagnosis not present

## 2017-01-15 DIAGNOSIS — I10 Essential (primary) hypertension: Secondary | ICD-10-CM | POA: Diagnosis not present

## 2017-01-19 ENCOUNTER — Emergency Department (HOSPITAL_COMMUNITY)

## 2017-01-19 ENCOUNTER — Encounter (HOSPITAL_COMMUNITY): Payer: Self-pay | Admitting: Physician Assistant

## 2017-01-19 ENCOUNTER — Inpatient Hospital Stay (HOSPITAL_COMMUNITY)
Admission: EM | Admit: 2017-01-19 | Discharge: 2017-01-21 | DRG: 432 | Disposition: A | Discharge status: Hospice | Attending: Internal Medicine | Admitting: Internal Medicine

## 2017-01-19 ENCOUNTER — Other Ambulatory Visit: Payer: Self-pay

## 2017-01-19 DIAGNOSIS — K767 Hepatorenal syndrome: Secondary | ICD-10-CM | POA: Diagnosis present

## 2017-01-19 DIAGNOSIS — E119 Type 2 diabetes mellitus without complications: Secondary | ICD-10-CM

## 2017-01-19 DIAGNOSIS — K219 Gastro-esophageal reflux disease without esophagitis: Secondary | ICD-10-CM | POA: Diagnosis present

## 2017-01-19 DIAGNOSIS — D649 Anemia, unspecified: Secondary | ICD-10-CM | POA: Diagnosis present

## 2017-01-19 DIAGNOSIS — I8510 Secondary esophageal varices without bleeding: Secondary | ICD-10-CM | POA: Diagnosis not present

## 2017-01-19 DIAGNOSIS — Z79899 Other long term (current) drug therapy: Secondary | ICD-10-CM | POA: Diagnosis not present

## 2017-01-19 DIAGNOSIS — I5032 Chronic diastolic (congestive) heart failure: Secondary | ICD-10-CM | POA: Diagnosis not present

## 2017-01-19 DIAGNOSIS — R0902 Hypoxemia: Secondary | ICD-10-CM | POA: Diagnosis not present

## 2017-01-19 DIAGNOSIS — Z66 Do not resuscitate: Secondary | ICD-10-CM | POA: Diagnosis present

## 2017-01-19 DIAGNOSIS — E872 Acidosis: Secondary | ICD-10-CM | POA: Diagnosis present

## 2017-01-19 DIAGNOSIS — I864 Gastric varices: Secondary | ICD-10-CM | POA: Diagnosis present

## 2017-01-19 DIAGNOSIS — I4892 Unspecified atrial flutter: Secondary | ICD-10-CM | POA: Diagnosis present

## 2017-01-19 DIAGNOSIS — E1165 Type 2 diabetes mellitus with hyperglycemia: Secondary | ICD-10-CM | POA: Diagnosis not present

## 2017-01-19 DIAGNOSIS — Z880 Allergy status to penicillin: Secondary | ICD-10-CM

## 2017-01-19 DIAGNOSIS — Z743 Need for continuous supervision: Secondary | ICD-10-CM | POA: Diagnosis not present

## 2017-01-19 DIAGNOSIS — K7460 Unspecified cirrhosis of liver: Secondary | ICD-10-CM | POA: Diagnosis not present

## 2017-01-19 DIAGNOSIS — I959 Hypotension, unspecified: Secondary | ICD-10-CM | POA: Diagnosis present

## 2017-01-19 DIAGNOSIS — R0602 Shortness of breath: Secondary | ICD-10-CM

## 2017-01-19 DIAGNOSIS — E1122 Type 2 diabetes mellitus with diabetic chronic kidney disease: Secondary | ICD-10-CM | POA: Diagnosis present

## 2017-01-19 DIAGNOSIS — K7581 Nonalcoholic steatohepatitis (NASH): Secondary | ICD-10-CM | POA: Diagnosis present

## 2017-01-19 DIAGNOSIS — Q249 Congenital malformation of heart, unspecified: Secondary | ICD-10-CM

## 2017-01-19 DIAGNOSIS — E875 Hyperkalemia: Secondary | ICD-10-CM | POA: Diagnosis present

## 2017-01-19 DIAGNOSIS — N183 Chronic kidney disease, stage 3 (moderate): Secondary | ICD-10-CM | POA: Diagnosis present

## 2017-01-19 DIAGNOSIS — Z7189 Other specified counseling: Secondary | ICD-10-CM

## 2017-01-19 DIAGNOSIS — E785 Hyperlipidemia, unspecified: Secondary | ICD-10-CM | POA: Diagnosis present

## 2017-01-19 DIAGNOSIS — I4891 Unspecified atrial fibrillation: Secondary | ICD-10-CM | POA: Diagnosis not present

## 2017-01-19 DIAGNOSIS — K7469 Other cirrhosis of liver: Principal | ICD-10-CM | POA: Diagnosis present

## 2017-01-19 DIAGNOSIS — F17210 Nicotine dependence, cigarettes, uncomplicated: Secondary | ICD-10-CM | POA: Diagnosis present

## 2017-01-19 DIAGNOSIS — N179 Acute kidney failure, unspecified: Secondary | ICD-10-CM | POA: Diagnosis present

## 2017-01-19 DIAGNOSIS — Z515 Encounter for palliative care: Secondary | ICD-10-CM | POA: Diagnosis not present

## 2017-01-19 DIAGNOSIS — E1142 Type 2 diabetes mellitus with diabetic polyneuropathy: Secondary | ICD-10-CM | POA: Diagnosis not present

## 2017-01-19 DIAGNOSIS — R279 Unspecified lack of coordination: Secondary | ICD-10-CM | POA: Diagnosis not present

## 2017-01-19 DIAGNOSIS — K7291 Hepatic failure, unspecified with coma: Secondary | ICD-10-CM | POA: Diagnosis not present

## 2017-01-19 DIAGNOSIS — Z9102 Food additives allergy status: Secondary | ICD-10-CM

## 2017-01-19 DIAGNOSIS — I8500 Esophageal varices without bleeding: Secondary | ICD-10-CM | POA: Diagnosis present

## 2017-01-19 DIAGNOSIS — R188 Other ascites: Secondary | ICD-10-CM | POA: Diagnosis not present

## 2017-01-19 DIAGNOSIS — I10 Essential (primary) hypertension: Secondary | ICD-10-CM | POA: Diagnosis not present

## 2017-01-19 DIAGNOSIS — Z881 Allergy status to other antibiotic agents status: Secondary | ICD-10-CM

## 2017-01-19 LAB — BASIC METABOLIC PANEL
Anion gap: 14 (ref 5–15)
BUN: 76 mg/dL — AB (ref 6–20)
CALCIUM: 9.4 mg/dL (ref 8.9–10.3)
CO2: 18 mmol/L — ABNORMAL LOW (ref 22–32)
CREATININE: 2.58 mg/dL — AB (ref 0.44–1.00)
Chloride: 96 mmol/L — ABNORMAL LOW (ref 101–111)
GFR calc Af Amer: 21 mL/min — ABNORMAL LOW (ref >60)
GFR, EST NON AFRICAN AMERICAN: 18 mL/min — AB (ref >60)
Glucose, Bld: 142 mg/dL — ABNORMAL HIGH (ref 65–99)
POTASSIUM: 6.9 mmol/L — AB (ref 3.5–5.1)
SODIUM: 128 mmol/L — AB (ref 135–145)

## 2017-01-19 LAB — COMPREHENSIVE METABOLIC PANEL
ALK PHOS: 84 U/L (ref 38–126)
ALT: 25 U/L (ref 14–54)
ANION GAP: 9 (ref 5–15)
AST: 27 U/L (ref 15–41)
Albumin: 3.2 g/dL — ABNORMAL LOW (ref 3.5–5.0)
BUN: 74 mg/dL — ABNORMAL HIGH (ref 6–20)
CALCIUM: 9.3 mg/dL (ref 8.9–10.3)
CO2: 20 mmol/L — ABNORMAL LOW (ref 22–32)
CREATININE: 2.57 mg/dL — AB (ref 0.44–1.00)
Chloride: 97 mmol/L — ABNORMAL LOW (ref 101–111)
GFR, EST AFRICAN AMERICAN: 21 mL/min — AB (ref >60)
GFR, EST NON AFRICAN AMERICAN: 18 mL/min — AB (ref >60)
Glucose, Bld: 225 mg/dL — ABNORMAL HIGH (ref 65–99)
Potassium: 6.3 mmol/L (ref 3.5–5.1)
Sodium: 126 mmol/L — ABNORMAL LOW (ref 135–145)
Total Bilirubin: 1 mg/dL (ref 0.3–1.2)
Total Protein: 6.2 g/dL — ABNORMAL LOW (ref 6.5–8.1)

## 2017-01-19 LAB — URINALYSIS, ROUTINE W REFLEX MICROSCOPIC
Bilirubin Urine: NEGATIVE
GLUCOSE, UA: NEGATIVE mg/dL
Hgb urine dipstick: NEGATIVE
KETONES UR: NEGATIVE mg/dL
LEUKOCYTES UA: NEGATIVE
NITRITE: NEGATIVE
PROTEIN: NEGATIVE mg/dL
Specific Gravity, Urine: 1.011 (ref 1.005–1.030)
pH: 5 (ref 5.0–8.0)

## 2017-01-19 LAB — I-STAT TROPONIN, ED: TROPONIN I, POC: 0 ng/mL (ref 0.00–0.08)

## 2017-01-19 LAB — CBC WITH DIFFERENTIAL/PLATELET
BASOS ABS: 0 10*3/uL (ref 0.0–0.1)
BASOS PCT: 0 %
EOS ABS: 0.2 10*3/uL (ref 0.0–0.7)
Eosinophils Relative: 2 %
HCT: 38 % (ref 36.0–46.0)
HEMOGLOBIN: 13.1 g/dL (ref 12.0–15.0)
Lymphocytes Relative: 20 %
Lymphs Abs: 1.4 10*3/uL (ref 0.7–4.0)
MCH: 31.1 pg (ref 26.0–34.0)
MCHC: 34.5 g/dL (ref 30.0–36.0)
MCV: 90.3 fL (ref 78.0–100.0)
Monocytes Absolute: 0.2 10*3/uL (ref 0.1–1.0)
Monocytes Relative: 3 %
NEUTROS PCT: 75 %
Neutro Abs: 5.1 10*3/uL (ref 1.7–7.7)
Platelets: 254 10*3/uL (ref 150–400)
RBC: 4.21 MIL/uL (ref 3.87–5.11)
RDW: 13.4 % (ref 11.5–15.5)
WBC: 6.9 10*3/uL (ref 4.0–10.5)

## 2017-01-19 LAB — HEMOGLOBIN A1C
HEMOGLOBIN A1C: 8 % — AB (ref 4.8–5.6)
MEAN PLASMA GLUCOSE: 182.9 mg/dL

## 2017-01-19 LAB — GLUCOSE, CAPILLARY: GLUCOSE-CAPILLARY: 275 mg/dL — AB (ref 65–99)

## 2017-01-19 LAB — MRSA PCR SCREENING: MRSA BY PCR: NEGATIVE

## 2017-01-19 MED ORDER — ALBUTEROL SULFATE (2.5 MG/3ML) 0.083% IN NEBU
10.0000 mg | INHALATION_SOLUTION | Freq: Once | RESPIRATORY_TRACT | Status: AC
  Administered 2017-01-19: 10 mg via RESPIRATORY_TRACT
  Filled 2017-01-19 (×2): qty 12

## 2017-01-19 MED ORDER — SIMETHICONE 80 MG PO CHEW
160.0000 mg | CHEWABLE_TABLET | Freq: Every day | ORAL | Status: DC | PRN
  Administered 2017-01-20: 160 mg via ORAL
  Filled 2017-01-19 (×2): qty 2

## 2017-01-19 MED ORDER — DEXTROSE 50 % IV SOLN
25.0000 mL | Freq: Once | INTRAVENOUS | Status: AC
  Administered 2017-01-19: 25 mL via INTRAVENOUS
  Filled 2017-01-19: qty 50

## 2017-01-19 MED ORDER — AMIODARONE HCL 100 MG PO TABS
100.0000 mg | ORAL_TABLET | Freq: Every day | ORAL | Status: DC
  Administered 2017-01-21: 100 mg via ORAL
  Filled 2017-01-19: qty 1

## 2017-01-19 MED ORDER — DEXTROSE 10 % IV SOLN
Freq: Once | INTRAVENOUS | Status: AC
Start: 2017-01-19 — End: 2017-01-19
  Administered 2017-01-19: 17:00:00 via INTRAVENOUS

## 2017-01-19 MED ORDER — ALBUMIN HUMAN 5 % IV SOLN
12.5000 g | Freq: Four times a day (QID) | INTRAVENOUS | Status: DC
  Filled 2017-01-19: qty 250

## 2017-01-19 MED ORDER — CALCIUM GLUCONATE 10 % IV SOLN
1.0000 g | Freq: Once | INTRAVENOUS | Status: AC
  Administered 2017-01-19: 1 g via INTRAVENOUS
  Filled 2017-01-19: qty 10

## 2017-01-19 MED ORDER — ORAL CARE MOUTH RINSE
15.0000 mL | Freq: Two times a day (BID) | OROMUCOSAL | Status: DC
  Administered 2017-01-19: 15 mL via OROMUCOSAL

## 2017-01-19 MED ORDER — ALBUMIN HUMAN 5 % IV SOLN
12.5000 g | INTRAVENOUS | Status: DC
  Administered 2017-01-19 – 2017-01-20 (×2): 12.5 g via INTRAVENOUS
  Filled 2017-01-19 (×3): qty 250

## 2017-01-19 MED ORDER — SPIRONOLACTONE 50 MG PO TABS: 50.0000 mg | ORAL_TABLET | Freq: Every day | ORAL | Status: DC

## 2017-01-19 MED ORDER — ONDANSETRON HCL 4 MG/2ML IJ SOLN
4.0000 mg | Freq: Once | INTRAMUSCULAR | Status: AC
  Administered 2017-01-19: 4 mg via INTRAVENOUS
  Filled 2017-01-19: qty 2

## 2017-01-19 MED ORDER — SODIUM CHLORIDE 0.9 % IV BOLUS (SEPSIS)
250.0000 mL | Freq: Once | INTRAVENOUS | Status: AC
  Administered 2017-01-19: 250 mL via INTRAVENOUS

## 2017-01-19 MED ORDER — INSULIN ASPART 100 UNIT/ML IV SOLN
10.0000 [IU] | Freq: Once | INTRAVENOUS | Status: AC
  Administered 2017-01-19: 10 [IU] via INTRAVENOUS
  Filled 2017-01-19: qty 0.1

## 2017-01-19 MED ORDER — LORAZEPAM 0.5 MG PO TABS
0.5000 mg | ORAL_TABLET | Freq: Four times a day (QID) | ORAL | Status: DC | PRN
  Administered 2017-01-20: 0.5 mg via ORAL
  Filled 2017-01-19: qty 1

## 2017-01-19 MED ORDER — FAMOTIDINE 20 MG PO TABS
20.0000 mg | ORAL_TABLET | Freq: Every day | ORAL | Status: DC
Start: 2017-01-20 — End: 2017-01-21
  Administered 2017-01-20: 20 mg via ORAL
  Filled 2017-01-19: qty 1

## 2017-01-19 MED ORDER — LACTULOSE 10 GM/15ML PO SOLN
20.0000 g | Freq: Three times a day (TID) | ORAL | Status: DC
  Administered 2017-01-19 – 2017-01-20 (×4): 20 g via ORAL
  Filled 2017-01-19 (×6): qty 30

## 2017-01-19 MED ORDER — MORPHINE SULFATE (PF) 4 MG/ML IV SOLN: 1.0000 mg | INTRAVENOUS | Status: DC | PRN

## 2017-01-19 MED ORDER — INSULIN ASPART 100 UNIT/ML ~~LOC~~ SOLN: 0.0000 [IU] | Freq: Three times a day (TID) | SUBCUTANEOUS | Status: DC

## 2017-01-19 MED ORDER — MIDODRINE HCL 5 MG PO TABS
10.0000 mg | ORAL_TABLET | Freq: Once | ORAL | Status: AC
  Administered 2017-01-19: 10 mg via ORAL
  Filled 2017-01-19: qty 2

## 2017-01-19 MED ORDER — SODIUM POLYSTYRENE SULFONATE 15 GM/60ML PO SUSP
30.0000 g | Freq: Once | ORAL | Status: AC
  Administered 2017-01-19: 30 g via ORAL
  Filled 2017-01-19: qty 120

## 2017-01-19 MED ORDER — INSULIN ASPART 100 UNIT/ML ~~LOC~~ SOLN
10.0000 [IU] | Freq: Once | SUBCUTANEOUS | Status: AC
  Administered 2017-01-19: 10 [IU] via SUBCUTANEOUS

## 2017-01-19 MED ORDER — SODIUM CHLORIDE 0.9 % IV SOLN
1.0000 g | Freq: Once | INTRAVENOUS | Status: AC
  Administered 2017-01-19: 1 g via INTRAVENOUS
  Filled 2017-01-19: qty 10

## 2017-01-19 NOTE — ED Provider Notes (Signed)
Natalie Lane is a 71 year old female presenting with 3-week history of worsening shortness of breath.  History of ascites requiring frequent paracenteses.  She receives weekly paracenteses, removing 9 L at each visit.  She presents today with continued worsening shortness of breath.   Potassium elevated at 6.3.  Calcium gluconate started.  Patient was admitted to hospitalist service with plan to go to the stepdown unit, however blood pressure remains low at 80/50.  Hospitalist concerned patient will need pressors.  Requests admission to critical care.  On reevaluation, blood pressure is 114/88.  Per chart review, patient normally runs 80-90/50.  Discussed with hospitalist, patient to be admitted under hospitalist service. Critical Care was not consulted while patient was in the ER.   Franchot Heidelberg, PA-C 01/20/17 0136    Quintella Reichert, MD 01/23/17 1907

## 2017-01-19 NOTE — ED Provider Notes (Signed)
[facility] EMERGENCY DEPARTMENT Provider Note   CSN: [id number] Arrival date & time: 01/19/17  1340     History   Chief Complaint Chief Complaint  Patient presents with  . Shortness of Breath    HPI Natalie Lane is a 71 y.o. female.  The history is provided by the patient and medical records. No language interpreter was used.  Shortness of Breath  Associated symptoms include cough.    Natalie Lane is a 71 y.o. 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  Patient states this is been worsening over the last 3 weeks, but acutely more severe over the last several days.  Associated with abdominal distention and nausea.  She notes history of multiple similar episodes over the last 3 years all of which she has ended up needing paracentesis.  She does note cough and congestion over the last 2 weeks.  She is taking all of her medications as directed with no recent changes. No additional medications taken prior to arrival for symptoms  No alleviating or aggravating factors noted.  Denies fever or chills.   Past Medical History:  Diagnosis Date  . Anemia   . Ascites   . CHD (congenital heart disease)    a. 1979 s/p septal defect repair (? VSD vs ASD), old op notes not available;  b. 04/2016 Echo: EF 60-65%, no rwma, Gr2 DD, mild AS, mild TR, no PFO (prev repaired).  . Chronic diastolic CHF (congestive heart failure) ([place])    04/2016 Echo: EF 60-65%, no rwma, Gr2 DD.  Marland Kitchen Complication of anesthesia   . 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 04/2016 s/p paracentesis.  . Diabetes mellitus, type 2 ([place])   . Dyspnea   . Edema   . Gastric varices    a. s/p banding.  Marland Kitchen GERD (gastroesophageal reflux disease)   . History of blood transfusion    . Hyperlipidemia   . Normal stress echocardiogram 2008  . Pancreatic adenoma   . Paroxysmal atrial flutter ([place])    a. 11/2009 s/p RFCA.  Marland Kitchen PONV (postoperative nausea and vomiting)    with Cholecystectomy  . Tobacco abuse    a. smokes 4 cigarettes/day.    Patient Active Problem List   Diagnosis Date Noted  . Tobacco abuse   . PONV (postoperative nausea and vomiting)   . Paroxysmal atrial flutter ([place])   . Pancreatic adenoma   . History of blood transfusion   . GERD (gastroesophageal reflux disease)   . Gastric varices   . Dyspnea   . Diabetes mellitus, type 2 ([place])   . 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 Complication of anesthesia   . Chronic diastolic CHF (congestive heart failure) ([place])   . Anemia   . SOB (shortness of breath)   . Hyperkalemia 09/02/2016  . Hypotension 09/02/2016  . Ascites of liver 09/02/2016  . Pressure injury of skin 08/24/2016  . ARF (acute renal failure) ([place]) 06/26/2016  . Tobacco use 06/26/2016  . Decompensated liver disease ([place]) 06/26/2016  . Acute kidney injury ([place])   . PAT (paroxysmal atrial tachycardia) ([place]) 05/12/2016  . Ascites 05/10/2016  . Decompensated hepatic cirrhosis ([place]) 05/10/2016  . Esophageal varices ([place]) 01/23/2014  . Cirrhosis of liver with ascites ([place]) 11/16/2013  .  Chest pain at rest 09/23/2011  . Heart palpitations 12/03/2010  . Atrial flutter ([place])   . CHD (congenital heart disease)   . Edema   . Hyperlipidemia   . Poorly controlled type 2 diabetes mellitus with peripheral neuropathy [facility])     Past Surgical History:  Procedure Laterality Date  . ABDOMINAL HYSTERECTOMY    . ASD versus VSD repair  1979   Operative notes not available.  . ATRIAL ABLATION SURGERY  10 /2011  . Micro   x2  . CHOLECYSTECTOMY    . ESOPHAGOGASTRODUODENOSCOPY (EGD) WITH PROPOFOL N/A 11/16/2013   Procedure: ESOPHAGOGASTRODUODENOSCOPY (EGD) WITH PROPOFOL;  Surgeon: Lear Ng, MD;  Location: [facility];   Service: Endoscopy;  Laterality: N/A;  . ESOPHAGOGASTRODUODENOSCOPY (EGD) WITH PROPOFOL N/A 01/23/2014   Procedure: ESOPHAGOGASTRODUODENOSCOPY (EGD) WITH PROPOFOL;  Surgeon: Lear Ng, MD;  Location: [facility];  Service: Endoscopy;  Laterality: N/A;  . GASTRIC VARICES BANDING N/A 11/16/2013   Procedure: GASTRIC VARICES BANDING;  Surgeon: Lear Ng, MD;  Location: [facility];  Service: Endoscopy;  Laterality: N/A;  . GASTRIC VARICES BANDING N/A 01/23/2014   Procedure: GASTRIC VARICES BANDING;  Surgeon: Lear Ng, MD;  Location: [facility];  Service: Endoscopy;  Laterality: N/A;  . HERNIA REPAIR    . IR GENERIC HISTORICAL  05/11/2016   IR PARACENTESIS 05/11/2016 Ascencion Dike, PA-C [facility]-INTERV RAD  . IR PARACENTESIS  07/08/2016  . IR PARACENTESIS  08/09/2016  . IR PARACENTESIS  08/24/2016  . IR PARACENTESIS  09/03/2016  . IR PARACENTESIS  09/16/2016  . IR PARACENTESIS  09/23/2016  . IR PARACENTESIS  10/07/2016  . IR PARACENTESIS  10/14/2016  . IR PARACENTESIS  10/27/2016  . IR PARACENTESIS  11/09/2016  . IR PARACENTESIS  11/30/2016  . IR PARACENTESIS  12/06/2016  . IR PARACENTESIS  12/13/2016  . IR PARACENTESIS  12/23/2016  . IR PARACENTESIS  12/30/2016  . IR PARACENTESIS  01/12/2017  . OTHER SURGICAL HISTORY     partial hysterectomy  . PARACENTESIS  09/03/2016    OB History    No data available       Home Medications    Prior to Admission medications   Medication Sig Start Date End Date Taking? Authorizing Provider  amiodarone (PACERONE) 100 MG tablet Take 1 tablet (100 mg total) by mouth daily. 11/22/16  Yes Georgette Shell, MD  furosemide (LASIX) 80 MG tablet Take 1 tablet (80 mg total) by mouth 2 (two) times daily. 09/05/16  Yes Velvet Bathe, MD  glimepiride (AMARYL) 2 MG tablet Take 1 tablet (2 mg total) by mouth 2 (two) times daily before a meal. 11/12/16  Yes Philemon Kingdom, MD  insulin NPH Human (NOVOLIN N) 100 UNIT/ML injection Inject 20 units  twice daily before meals. Patient taking differently: Inject 15-25 Units into the skin See admin instructions. Inject 25 units into the skin in the morning and 15 units into the skin at night 08/10/16  Yes Elayne Snare, MD  lactulose (CHRONULAC) 10 GM/15ML solution Take 30 mLs (20 g total) by mouth 3 (three) times daily. 09/05/16  Yes Velvet Bathe, MD  LORazepam (ATIVAN) 0.5 MG tablet Take 0.5 mg by mouth every 6 (six) hours as needed for anxiety.   Yes [provider]  ondansetron (ZOFRAN ODT) 4 MG disintegrating tablet Take 1 tablet (4 mg total) by mouth every 8 (eight) hours as needed for nausea or vomiting. 12/04/16  Yes Caccavale, Sophia, PA-C  prochlorperazine (COMPAZINE) 10 MG  tablet Take 10 mg by mouth every 6 (six) hours as needed for nausea/vomiting. 12/14/16  Yes [provider]  ranitidine (ZANTAC) 150 MG capsule Take 150 mg by mouth daily as needed for heartburn.   Yes [provider]  Simethicone (GAS-X PO) Take 1 tablet by mouth daily as needed (gas).   Yes [provider]  spironolactone (ALDACTONE) 50 MG tablet Take 50 mg by mouth daily.  09/15/16  Yes [provider]  glucose blood (ONE TOUCH ULTRA TEST) test strip Use as instructed to check sugar 2 times daily   Dx- E11.42 Patient not taking: Reported on 11/19/2016 10/04/16   Philemon Kingdom, MD  Insulin Pen Needle (CAREFINE PEN NEEDLES) 31G X 6 MM MISC Use to inject insulin 2 times daily Patient not taking: Reported on 11/19/2016 11/16/16   Philemon Kingdom, MD    Family History Family History  Problem Relation Age of Onset  . Hypertension Mother   . Kidney disease Sister   . Depression Paternal Grandfather   . Diabetes Brother     Social History Social History   Tobacco Use  . Smoking status: Former Smoker    Packs/day: 0.50    Years: 36.00    Pack years: 18.00    Types: Cigarettes    Last attempt to quit: 02/16/2016    Years since quitting: 0.9  . Smokeless tobacco: Never  Used  . Tobacco comment: was smoking 4 cigs/day prior to admission.  Substance Use Topics  . Alcohol use: No  . Drug use: No     Allergies   Monosodium glutamate; Red dye; Erythromycin; Penicillins; Statins; Sulfa antibiotics; Glipizide; Iohexol; and Tape   Review of Systems Review of Systems  HENT: Positive for congestion.   Respiratory: Positive for cough and shortness of breath.   Gastrointestinal: Positive for nausea.  All other systems reviewed and are negative.    Physical Exam Updated Vital Signs BP 114/88 (BP Location: Left Arm)   Pulse 88   Temp 98.1 F (36.7 C) (Rectal)   Resp 20   Ht 5\' 3"  (1.6 m)   Wt 72.6 kg (160 lb)   SpO2 100%   BMI 28.34 kg/m   Physical Exam  Constitutional: She is oriented to person, place, and time. She appears well-developed and well-nourished. No distress.  HENT:  Head: Normocephalic and atraumatic.  Cardiovascular: Normal rate, regular rhythm and normal heart sounds.  No murmur heard. Pulmonary/Chest: Effort normal and breath sounds normal. No respiratory distress.  Abdominal: Soft. Bowel sounds are normal. She exhibits distension. There is tenderness.  Musculoskeletal: She exhibits no edema.  Neurological: She is alert and oriented to person, place, and time.  Skin: Skin is warm and dry.  Nursing note and vitals reviewed.    ED Treatments / Results  Labs (all labs ordered are listed, but only abnormal results are displayed) Labs Reviewed  COMPREHENSIVE METABOLIC PANEL - Abnormal; Notable for the following components:      Result Value   Sodium 126 (*)    Potassium 6.3 (*)    Chloride 97 (*)    CO2 20 (*)    Glucose, Bld 225 (*)    BUN 74 (*)    Creatinine, Ser 2.57 (*)    Total Protein 6.2 (*)    Albumin 3.2 (*)    GFR calc non Af Amer 18 (*)    GFR calc Af Amer 21 (*)    All other components within normal limits  CBC WITH DIFFERENTIAL/PLATELET  URINALYSIS, ROUTINE W REFLEX MICROSCOPIC  BASIC METABOLIC PANEL    HEMOGLOBIN A1C  COMPREHENSIVE METABOLIC PANEL  PROTIME-INR  CBC  I-STAT TROPONIN, ED    EKG  EKG Interpretation  Date/Time:  Wednesday January 19 2017 13:53:27 EST Ventricular Rate:  69 PR Interval:    QRS Duration: 139 QT Interval:  419 QTC Calculation: 449 R Axis:   86 Text Interpretation:  Atrial fibrillation Right bundle branch block Minimal ST elevation, inferior leads No significant change since last tracing Confirmed by Deno Etienne [id number]) on 01/19/2017 3:00:36 PM       Radiology Dg Chest 2 View  Result Date: 01/19/2017 CLINICAL DATA:  Shortness of breath over the last week. Cirrhosis of the liver and congestive heart failure. EXAM: CHEST  2 VIEW COMPARISON:  11/19/2016 FINDINGS: Previous median sternotomy. Normal heart size. Normal mediastinal shadows. Chronic scarring at the lung bases. No sign of active infiltrate, mass, effusion or level are collapse. No significant bone finding. IMPRESSION: Chronic basilar pulmonary scarring.  No active process identified. Electronically Signed   By: Nelson Chimes M.D.   On: 01/19/2017 15:12    Procedures Procedures (including critical care time)  Medications Ordered in ED Medications  albumin human 5 % solution 12.5 g (not administered)  amiodarone (PACERONE) tablet 100 mg (not administered)  lactulose (CHRONULAC) 10 GM/15ML solution 20 g (not administered)  LORazepam (ATIVAN) tablet 0.5 mg (not administered)  famotidine (PEPCID) tablet 20 mg (not administered)  simethicone (MYLICON) chewable tablet 160 mg (not administered)  spironolactone (ALDACTONE) tablet 50 mg (not administered)  morphine 4 MG/ML injection 1 mg (not administered)  insulin aspart (novoLOG) injection 0-9 Units (not administered)  ondansetron (ZOFRAN) injection 4 mg (4 mg Intravenous Given 01/19/17 1642)  albuterol (PROVENTIL) (2.5 MG/3ML) 0.083% nebulizer solution 10 mg (10 mg Nebulization Given 01/19/17 1626)  insulin aspart (novoLOG) injection 10 Units (10  Units Intravenous Given 01/19/17 1644)  dextrose 10 % infusion ( Intravenous New Bag/Given 01/19/17 1655)  calcium gluconate inj 10% (1 g) URGENT USE ONLY! (1 g Intravenous Given 01/19/17 1646)     Initial Impression / Assessment and Plan / ED Course  I have reviewed the triage vital signs and the nursing notes.  Pertinent labs & imaging results that were available during my care of the patient were reviewed by me and considered in my medical decision making (see chart for details).    CHIRSTINE DEFRAIN is a 71 y.o. female who presents to ED for shortness of breath which has been progressively worsening over the last 3 years. Hx of ascites 2/2 liver failure and cirrhosis with hx of similar presentations requiring IR paracentesis. BP noted. Hx of chronic hypotension. Per chart review, BP of 92/59 at last ER visit when discharged home. BP's running 80's/50's, not much lower than her baseline. Will continue to monitor. Labs reviewed and notable for hyperkalemia. Treated with calcium, insulin/glucose and albuterol. Will need to repeat EKG and K+. Will need paracentesis. Hospitalist consulted who will admit.   Patient discussed with Dr. Tyrone Nine who agrees with treatment plan.    Final Clinical Impressions(s) / ED Diagnoses   Final diagnoses:  Shortness of breath    ED Discharge Orders    None       Athena Baltz, Ozella Almond, PA-C 01/19/17 [place], [place], DO 01/20/17 1614

## 2017-01-19 NOTE — ED Notes (Signed)
Pt pulled IV out. 2 RN's looked then IV Team called for placement.

## 2017-01-19 NOTE — ED Triage Notes (Addendum)
Pt arrived via [facility] from home c/o SOB. Pt has hx of such with associated ascites. Pt denies CP, N/V/D at this time. Pt is on 2LPM continuous oxygen at home. Arrived to ED on a SFM @ 6LPM w/ an Sp02 of 100%. RA Sp02 not assessed PTA. Pt found to be hypotensive @ 90/50 with a HR of 84 for EMS. Pt is alert and oriented x4.

## 2017-01-19 NOTE — Progress Notes (Signed)
CRITICAL VALUE ALERT  Critical Value:  K 6.9  Date & Time Notied:  01/19/17 2036  Provider Notified: Silas [place]  Orders Received/Actions taken: Orders placed

## 2017-01-19 NOTE — ED Notes (Signed)
Patient transported to X-ray 

## 2017-01-19 NOTE — Consult Note (Signed)
History and Physical [name] KJZ:[id number] DOB: February 25, 1945 DOA: 01/19/2017   PCP: Carol Ada, MD   Patient coming from:  Home    Chief Complaint: Recurrent Ascites   HPI: Natalie Lane is a 71 y.o. female with medical history significant for cirrhosis and ascites due to [facility], with recurrent paracentesis events, for which she requires frequent admissions to the hospital, diabetes, diastolic heart failure, with last echo in March 2018 EF 60-65%, anemia, gastric varices, atrial flutter on amiodarone not on anticoagulation, hyperlipidemia, now presenting with similar symptoms.  Last paracentesis was 1 week ago as outpatient.  Reports her fluid accumulating more frequently.  Nothing makes it better, palpation makes it worse.  No confusion is reported.  She denies any dysuria or gross hematuria.  She denies any chest pain or palpitations.  She has some shortness of breath due to ascites.  She denies any constipation.  She denies any leg swelling.  She reports that fluid removal is between 6 and 9 L each time.  Patient is full code.  ED Course:  BP (!) 85/51   Pulse 82   Temp 98.1 F (36.7 C) (Rectal)   Resp (!) 25   Ht 5\' 3"  (1.6 m)   Wt 72.6 kg (160 lb)   SpO2 98%   BMI 28.34 kg/m    Once again, she was noted to have significant ascites.  Chest x-ray negative for acute process. Potassium was 6.3, calcium gluconate started with Insulin, Albuterol and D10 with close monitoring  Sodium 126, corrected 130 GLucose 225 22.57 White count 6.9 and risks of the CBC is normal TN negative CXR Chronic basilar pulmonary scarring.  No active process identified.   Review of Systems:  As per HPI otherwise all other systems reviewed and are negative  Past Medical History:  Diagnosis Date  . Anemia   . Ascites   . CHD  (congenital heart disease)    a. 1979 s/p septal defect repair (? VSD vs ASD), old op notes not available;  b. 04/2016 Echo: EF 60-65%, no rwma, Gr2 DD, mild AS, mild TR, no PFO (prev repaired).  . Chronic diastolic CHF (congestive heart failure) ([place])    04/2016 Echo: EF 60-65%, no rwma, Gr2 DD.  Marland Kitchen Complication of anesthesia   . 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 04/2016 s/p paracentesis.  . Diabetes mellitus, type 2 ([place])   . Dyspnea   . Edema   . Gastric varices    a. s/p banding.  Marland Kitchen GERD (gastroesophageal reflux disease)   .  History of blood transfusion   . Hyperlipidemia   . Normal stress echocardiogram 2008  . Pancreatic adenoma   . Paroxysmal atrial flutter ([place])    a. 11/2009 s/p RFCA.  Marland Kitchen PONV (postoperative nausea and vomiting)    with Cholecystectomy  . Tobacco abuse    a. smokes 4 cigarettes/day.    Past Surgical History:  Procedure Laterality Date  . ABDOMINAL HYSTERECTOMY    . ASD versus VSD repair  1979   Operative notes not available.  . ATRIAL ABLATION SURGERY  10 /2011  . [place]   x2  . CHOLECYSTECTOMY    . ESOPHAGOGASTRODUODENOSCOPY (EGD) WITH PROPOFOL N/A 11/16/2013   Procedure: ESOPHAGOGASTRODUODENOSCOPY (EGD) WITH PROPOFOL;  Surgeon: Lear Ng, MD;  Location: [place];  Service: Endoscopy;  Laterality: N/A;  . ESOPHAGOGASTRODUODENOSCOPY (EGD) WITH PROPOFOL N/A 01/23/2014   Procedure: ESOPHAGOGASTRODUODENOSCOPY (EGD) WITH PROPOFOL;  Surgeon: Lear Ng, MD;  Location: [place];  Service: Endoscopy;  Laterality: N/A;  . GASTRIC VARICES BANDING N/A 11/16/2013   Procedure: GASTRIC VARICES BANDING;  Surgeon: Lear Ng, MD;  Location: [place];  Service: Endoscopy;  Laterality: N/A;  . GASTRIC VARICES BANDING N/A 01/23/2014   Procedure: GASTRIC VARICES BANDING;  Surgeon: Lear Ng, MD;  Location: [place];  Service: Endoscopy;  Laterality: N/A;  . HERNIA REPAIR    . IR GENERIC HISTORICAL  05/11/2016    IR PARACENTESIS 05/11/2016 Ascencion Dike, PA-C [facility]INTER[facility] RAD  . IR PARACENTESIS  07/08/2016  . IR PARACENTESIS  08/09/2016  . IR PARACENTESIS  08/24/2016  . IR PARACENTESIS  09/03/2016  . IR PARACENTESIS  09/16/2016  . IR PARACENTESIS  09/23/2016  . IR PARACENTESIS  10/07/2016  . IR PARACENTESIS  10/14/2016  . IR PARACENTESIS  10/27/2016  . IR PARACENTESIS  11/09/2016  . IR PARACENTESIS  11/30/2016  . IR PARACENTESIS  12/06/2016  . IR PARACENTESIS  12/13/2016  . IR PARACENTESIS  12/23/2016  . IR PARACENTESIS  12/30/2016  . IR PARACENTESIS  01/12/2017  . OTHER SURGICAL HISTORY     partial hysterectomy  . PARACENTESIS  09/03/2016    Social History Social History   Socioeconomic History  . Marital status: Married    Spouse name: Not on file  . Number of children: 3  . Years of education: Not on file  . Highest education level: Not on file  Social Needs  . Financial resource strain: Not on file  . Food insecurity - worry: Not on file  . Food insecurity - inability: Not on file  . Transportation needs - medical: Not on file  . Transportation needs - non-medical: Not on file  Occupational History  . Occupation: Journalist, newspaper: [employer]  Tobacco Use  . Smoking status: Former Smoker    Packs/day: 0.50    Years: 36.00    Pack years: 18.00    Types: Cigarettes    Last attempt to quit: 02/16/2016    Years since quitting: 0.9  . Smokeless tobacco: Never Used  . Tobacco comment: was smoking 4 cigs/day prior to admission.  Substance and Sexual Activity  . Alcohol use: No  . Drug use: No  . Sexual activity: Not on file  Other Topics Concern  . Not on file  Social History Narrative   Lives in [place] with boyfriend.  Does not routinely exercise.     Allergies  Allergen Reactions  . Monosodium Glutamate Anaphylaxis    MSG  . Red  Dye Anaphylaxis  . Erythromycin Other (See Comments)    Reaction:  Migraine   . Penicillins Other (See Comments)    Reaction:  Migraine    Has patient had a PCN reaction causing immediate rash, facial/tongue/throat swelling, SOB or lightheadedness with hypotension: No Has patient had a PCN reaction causing severe rash involving mucus membranes or skin necrosis: No Has patient had a PCN reaction that required hospitalization No Has patient had a PCN reaction occurring within the last 10 years: No If all of the above answers are "NO", then may proceed with Cephalosporin use.  . Statins Other (See Comments)    Reaction:  Severe leg cramps   . Sulfa Antibiotics Hives  . Glipizide Rash  . Iohexol Rash  . Tape Rash    "caused blisters"    Family History  Problem Relation Age of Onset  . Hypertension Mother   . Kidney disease Sister   . Depression Paternal Grandfather   . Diabetes Brother       Prior to Admission medications   Medication Sig Start Date End Date Taking? Authorizing Provider  amiodarone (PACERONE) 100 MG tablet Take 1 tablet (100 mg total) by mouth daily. Patient not taking: Reported on 12/04/2016 11/22/16   Georgette Shell, MD  amiodarone (PACERONE) 200 MG tablet Take 200 mg by mouth daily. 11/01/16   [provider]  furosemide (LASIX) 80 MG tablet Take 1 tablet (80 mg total) by mouth 2 (two) times daily. 09/05/16   Velvet Bathe, MD  glimepiride (AMARYL) 2 MG tablet Take 1 tablet (2 mg total) by mouth 2 (two) times daily before a meal. 11/12/16   Philemon Kingdom, MD  glucose blood (ONE TOUCH ULTRA TEST) test strip Use as instructed to check sugar 2 times daily   Dx- E11.42 Patient not taking: Reported on 11/19/2016 10/04/16   Philemon Kingdom, MD  insulin NPH Human (NOVOLIN N) 100 UNIT/ML injection Inject 20 units twice daily before meals. Patient taking differently: Inject 15-25 Units into the skin See admin instructions. Inject 25 units into the skin in the morning and 15 units into the skin at night 08/10/16   Elayne Snare, MD  Insulin Pen Needle (CAREFINE PEN NEEDLES) 31G X 6 MM MISC Use to  inject insulin 2 times daily Patient not taking: Reported on 11/19/2016 11/16/16   Philemon Kingdom, MD  lactulose (CHRONULAC) 10 GM/15ML solution Take 30 mLs (20 g total) by mouth 3 (three) times daily. 09/05/16   Velvet Bathe, MD  ondansetron (ZOFRAN ODT) 4 MG disintegrating tablet Take 1 tablet (4 mg total) by mouth every 8 (eight) hours as needed for nausea or vomiting. 12/04/16   Caccavale, Sophia, PA-C  ondansetron (ZOFRAN-ODT) 4 MG disintegrating tablet Take 4 mg by mouth daily as needed for nausea. 08/31/16   [provider]  ranitidine (ZANTAC) 150 MG capsule Take 150 mg by mouth daily as needed for heartburn.    [provider]  Simethicone (GAS-X PO) Take 1 tablet by mouth daily as needed (gas).    [provider]  spironolactone (ALDACTONE) 50 MG tablet Take 1 tablet by mouth daily. 09/15/16   [provider]    Physical Exam:  Vitals:   01/19/17 1357 01/19/17 1400 01/19/17 1400 01/19/17 1429  BP:  (!) 85/51 (!) 85/51   Pulse:  76 82   Resp:  18 (!) 25   Temp:    98.1 F (36.7 C)  TempSrc:    Rectal  SpO2:  97% 98%  Weight: 72.6 kg (160 lb)     Height: 5\' 3"  (1.6 m)      Constitutional: Moderate degree of discomfort, due to increasing ascites, pain, with increased work of breathing due to same.  Very ill-appearing and cachectic Eyes: PERRL, lids and conjunctivae normal ENMT: Mucous membranes are moist, without exudate or lesions  Neck: normal, supple, no masses, no thyromegaly Respiratory: clear to auscultation bilaterally, no wheezing, no crackles. Normal respiratory effort  Cardiovascular: Regular rate and rhythm,  murmur, rubs or gallops. No extremity edema. 2+ pedal pulses. No carotid bruits.  Abdomen: Distended, massive ascites, no hepatosplenomegaly. Bowel sounds positive.  Musculoskeletal: no clubbing / cyanosis. Moves all extremities Skin: no jaundice, No lesions.  Neurologic: Sensation intact  Strength equal in all  extremities     Labs on Admission: I have personally reviewed following labs and imaging studies  CBC: Recent Labs  Lab 01/19/17 1420  WBC 6.9  NEUTROABS 5.1  HGB 13.1  HCT 38.0  MCV 90.3  PLT 211    Basic Metabolic Panel: Recent Labs  Lab 01/19/17 1420  NA 126*  K 6.3*  CL 97*  CO2 20*  GLUCOSE 225*  BUN 74*  CREATININE 2.57*  CALCIUM 9.3    GFR: Estimated Creatinine Clearance: 19.5 mL/min (A) (by C-G formula based on SCr of 2.57 mg/dL (H)).  Liver Function Tests: Recent Labs  Lab 01/19/17 1420  AST 27  ALT 25  ALKPHOS 84  BILITOT 1.0  PROT 6.2*  ALBUMIN 3.2*   No results for input(s): LIPASE, AMYLASE in the last 168 hours. No results for input(s): AMMONIA in the last 168 hours.  Coagulation Profile: No results for input(s): INR, PROTIME in the last 168 hours.  Cardiac Enzymes: No results for input(s): CKTOTAL, CKMB, CKMBINDEX, TROPONINI in the last 168 hours.  BNP (last 3 results) No results for input(s): PROBNP in the last 8760 hours.  HbA1C: No results for input(s): HGBA1C in the last 72 hours.  CBG: No results for input(s): GLUCAP in the last 168 hours.  Lipid Profile: No results for input(s): CHOL, HDL, LDLCALC, TRIG, CHOLHDL, LDLDIRECT in the last 72 hours.  Thyroid Function Tests: No results for input(s): TSH, T4TOTAL, FREET4, T3FREE, THYROIDAB in the last 72 hours.  Anemia Panel: No results for input(s): VITAMINB12, FOLATE, FERRITIN, TIBC, IRON, RETICCTPCT in the last 72 hours.  Urine analysis:    Component Value Date/Time   COLORURINE YELLOW 01/19/2017 [place] 01/19/2017 1458   LABSPEC 1.011 01/19/2017 1458   PHURINE 5.0 01/19/2017 1458   GLUCOSEU NEGATIVE 01/19/2017 1458   HGBUR NEGATIVE 01/19/2017 1458   BILIRUBINUR NEGATIVE 01/19/2017 1458   KETONESUR NEGATIVE 01/19/2017 1458   PROTEINUR NEGATIVE 01/19/2017 1458   NITRITE NEGATIVE 01/19/2017 1458   LEUKOCYTESUR NEGATIVE 01/19/2017 1458    Sepsis  Labs: @LABRCNTIP (procalcitonin:4,lacticidven:4) )No results found for this or any previous visit (from the past 240 hour(s)).   Radiological Exams on Admission: Dg Chest 2 View  Result Date: 01/19/2017 CLINICAL DATA:  Shortness of breath over the last week. Cirrhosis of the liver and congestive heart failure. EXAM: CHEST  2 VIEW COMPARISON:  11/19/2016 FINDINGS: Previous median sternotomy. Normal heart size. Normal mediastinal shadows. Chronic scarring at the lung bases. No sign of active infiltrate, mass, effusion or level are collapse. No significant bone finding. IMPRESSION: Chronic basilar pulmonary scarring.  No active process identified. Electronically Signed   By: Nelson Chimes M.D.   On: 01/19/2017 15:12    EKG: Independently  reviewed.  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  She continues to have therapeutic paracentesis, multiple admissions for same, with fluid removal up to 9 L. IR has been consulted for therapeutic paracentesis, for which Admit to SDU  She is to be maintained with n.p.o. after midnight, as planned paracentesis is to occur in the morning. Close monitoring of Vital signs  Of note, once again her CODE STATUS has been addressed, in view of her worsening clinical status.  She wishes to continue to be full code. Because of her low blood pressure, which is chronic, will also provide albumin 12.5 mg, 3 doses,, will administer every 6 hours. Blood pressure continues to drop, will request CCM involvement.  Pain control as  needed  Acute hyperkalemia, 6.3  Hyperkalemia protocol has been followed in the ER,  has been given IV insulin-D50-bicarbonate-Kayexalate-albuterol treatment  Recheck BMET  Repeat EKG as indicated Check Troponin Telemetry   Acute on chronic kidney disease, the serum creatinine is 2.57, up from 2.48 in July, 1.15 in May, she is not on diuretics. Monitor telemetry, Repeat chemistries in the morning   Diabetes mellitus, insulin-dependent, managed at home with oral and insulin injectable. Glucose 225  Sliding scale insulin Check A1c   History of atrial flutter, CHADSVASC 3 Followed by cardiology, not on anticoagulation given cirrhosis with varices Continue amiodarone and propranolol in the morning  GERD, no acute symptoms Continue PPI   Sharene Butters, PA-C [facility]   01/19/2017, 4:23 PM

## 2017-01-20 ENCOUNTER — Inpatient Hospital Stay (HOSPITAL_COMMUNITY)

## 2017-01-20 ENCOUNTER — Encounter (HOSPITAL_COMMUNITY): Payer: Self-pay

## 2017-01-20 DIAGNOSIS — E1142 Type 2 diabetes mellitus with diabetic polyneuropathy: Secondary | ICD-10-CM

## 2017-01-20 DIAGNOSIS — Z7189 Other specified counseling: Secondary | ICD-10-CM

## 2017-01-20 DIAGNOSIS — I5032 Chronic diastolic (congestive) heart failure: Secondary | ICD-10-CM

## 2017-01-20 DIAGNOSIS — Z515 Encounter for palliative care: Secondary | ICD-10-CM

## 2017-01-20 DIAGNOSIS — E875 Hyperkalemia: Secondary | ICD-10-CM

## 2017-01-20 DIAGNOSIS — K7460 Unspecified cirrhosis of liver: Secondary | ICD-10-CM

## 2017-01-20 DIAGNOSIS — R188 Other ascites: Secondary | ICD-10-CM

## 2017-01-20 DIAGNOSIS — I8510 Secondary esophageal varices without bleeding: Secondary | ICD-10-CM

## 2017-01-20 DIAGNOSIS — E1165 Type 2 diabetes mellitus with hyperglycemia: Secondary | ICD-10-CM

## 2017-01-20 LAB — BASIC METABOLIC PANEL
ANION GAP: 13 (ref 5–15)
Anion gap: 12 (ref 5–15)
BUN: 75 mg/dL — ABNORMAL HIGH (ref 6–20)
BUN: 72 mg/dL — ABNORMAL HIGH (ref 6–20)
CHLORIDE: 98 mmol/L — AB (ref 101–111)
CO2: 16 mmol/L — ABNORMAL LOW (ref 22–32)
CO2: 18 mmol/L — AB (ref 22–32)
Calcium: 8.6 mg/dL — ABNORMAL LOW (ref 8.9–10.3)
Calcium: 9 mg/dL (ref 8.9–10.3)
Chloride: 100 mmol/L — ABNORMAL LOW (ref 101–111)
Creatinine, Ser: 2.6 mg/dL — ABNORMAL HIGH (ref 0.44–1.00)
Creatinine, Ser: 2.63 mg/dL — ABNORMAL HIGH (ref 0.44–1.00)
GFR calc Af Amer: 20 mL/min — ABNORMAL LOW (ref >60)
GFR calc non Af Amer: 18 mL/min — ABNORMAL LOW (ref >60)
GFR calc non Af Amer: 17 mL/min — ABNORMAL LOW (ref >60)
GFR, EST AFRICAN AMERICAN: 20 mL/min — AB (ref >60)
Glucose, Bld: 263 mg/dL — ABNORMAL HIGH (ref 65–99)
Glucose, Bld: 186 mg/dL — ABNORMAL HIGH (ref 65–99)
Potassium: 6 mmol/L — ABNORMAL HIGH (ref 3.5–5.1)
Potassium: 5.3 mmol/L — ABNORMAL HIGH (ref 3.5–5.1)
Sodium: 128 mmol/L — ABNORMAL LOW (ref 135–145)
Sodium: 129 mmol/L — ABNORMAL LOW (ref 135–145)

## 2017-01-20 LAB — CBC
HEMATOCRIT: 31.8 % — AB (ref 36.0–46.0)
HEMOGLOBIN: 10.7 g/dL — AB (ref 12.0–15.0)
MCH: 30.7 pg (ref 26.0–34.0)
MCHC: 33.6 g/dL (ref 30.0–36.0)
MCV: 91.1 fL (ref 78.0–100.0)
Platelets: 225 10*3/uL (ref 150–400)
RBC: 3.49 MIL/uL — AB (ref 3.87–5.11)
RDW: 13.6 % (ref 11.5–15.5)
WBC: 7.2 10*3/uL (ref 4.0–10.5)

## 2017-01-20 LAB — GLUCOSE, CAPILLARY
GLUCOSE-CAPILLARY: 263 mg/dL — AB (ref 65–99)
GLUCOSE-CAPILLARY: 145 mg/dL — AB (ref 65–99)
Glucose-Capillary: 218 mg/dL — ABNORMAL HIGH (ref 65–99)
Glucose-Capillary: 188 mg/dL — ABNORMAL HIGH (ref 65–99)
Glucose-Capillary: 137 mg/dL — ABNORMAL HIGH (ref 65–99)

## 2017-01-20 LAB — COMPREHENSIVE METABOLIC PANEL
ALT: 22 U/L (ref 14–54)
AST: 28 U/L (ref 15–41)
Albumin: 2.8 g/dL — ABNORMAL LOW (ref 3.5–5.0)
Alkaline Phosphatase: 71 U/L (ref 38–126)
Anion gap: 11 (ref 5–15)
BUN: 75 mg/dL — AB (ref 6–20)
CHLORIDE: 97 mmol/L — AB (ref 101–111)
CO2: 18 mmol/L — ABNORMAL LOW (ref 22–32)
Calcium: 8.9 mg/dL (ref 8.9–10.3)
Creatinine, Ser: 2.78 mg/dL — ABNORMAL HIGH (ref 0.44–1.00)
GFR calc Af Amer: 19 mL/min — ABNORMAL LOW (ref >60)
GFR calc non Af Amer: 16 mL/min — ABNORMAL LOW (ref >60)
GLUCOSE: 339 mg/dL — AB (ref 65–99)
POTASSIUM: 6.4 mmol/L — AB (ref 3.5–5.1)
Sodium: 126 mmol/L — ABNORMAL LOW (ref 135–145)
Total Bilirubin: 0.6 mg/dL (ref 0.3–1.2)
Total Protein: 5.1 g/dL — ABNORMAL LOW (ref 6.5–8.1)

## 2017-01-20 LAB — PROTIME-INR
INR: 1.15
PROTHROMBIN TIME: 14.6 s (ref 11.4–15.2)

## 2017-01-20 MED ORDER — LIDOCAINE 2% (20 MG/ML) 5 ML SYRINGE
INTRAMUSCULAR | Status: AC
  Filled 2017-01-20: qty 10

## 2017-01-20 MED ORDER — ALBUMIN HUMAN 25 % IV SOLN
25.0000 g | Freq: Four times a day (QID) | INTRAVENOUS | Status: AC
  Administered 2017-01-20: 25 g via INTRAVENOUS
  Filled 2017-01-20 (×2): qty 100

## 2017-01-20 MED ORDER — MORPHINE SULFATE (CONCENTRATE) 10 MG/0.5ML PO SOLN: 2.5000 mg | ORAL | Status: DC | PRN

## 2017-01-20 MED ORDER — INSULIN DETEMIR 100 UNIT/ML ~~LOC~~ SOLN
5.0000 [IU] | Freq: Every day | SUBCUTANEOUS | Status: DC
  Administered 2017-01-20: 5 [IU] via SUBCUTANEOUS
  Filled 2017-01-20 (×2): qty 0.05

## 2017-01-20 MED ORDER — MIDODRINE HCL 5 MG PO TABS
10.0000 mg | ORAL_TABLET | Freq: Once | ORAL | Status: DC
  Filled 2017-01-20: qty 2

## 2017-01-20 MED ORDER — MIDODRINE HCL 2.5 MG PO TABS
10.0000 mg | ORAL_TABLET | Freq: Once | ORAL | Status: AC
Start: 2017-01-20 — End: 2017-01-20
  Administered 2017-01-20: 10 mg via ORAL
  Filled 2017-01-20: qty 4

## 2017-01-20 MED ORDER — OCTREOTIDE ACETATE 50 MCG/ML IJ SOLN
100.0000 ug | Freq: Three times a day (TID) | INTRAMUSCULAR | Status: DC
  Filled 2017-01-20: qty 2

## 2017-01-20 MED ORDER — MIDODRINE HCL 5 MG PO TABS
10.0000 mg | ORAL_TABLET | Freq: Three times a day (TID) | ORAL | Status: DC
  Administered 2017-01-20 – 2017-01-21 (×2): 10 mg via ORAL
  Filled 2017-01-20 (×2): qty 2

## 2017-01-20 MED ORDER — INSULIN DETEMIR 100 UNIT/ML ~~LOC~~ SOLN: 5.0000 [IU] | Freq: Every day | SUBCUTANEOUS | Status: DC

## 2017-01-20 MED ORDER — OCTREOTIDE ACETATE 50 MCG/ML IJ SOLN
100.0000 ug | Freq: Three times a day (TID) | INTRAMUSCULAR | Status: DC
  Administered 2017-01-20: 100 ug via SUBCUTANEOUS
  Filled 2017-01-20 (×3): qty 2

## 2017-01-20 MED ORDER — ALBUMIN HUMAN 25 % IV SOLN
25.0000 g | Freq: Once | INTRAVENOUS | Status: AC
  Administered 2017-01-20: 25 g via INTRAVENOUS

## 2017-01-20 MED ORDER — SODIUM POLYSTYRENE SULFONATE 15 GM/60ML PO SUSP
30.0000 g | Freq: Once | ORAL | Status: AC
  Administered 2017-01-20: 30 g via ORAL
  Filled 2017-01-20: qty 120

## 2017-01-20 MED ORDER — MIDODRINE HCL 5 MG PO TABS: 10.0000 mg | ORAL_TABLET | Freq: Three times a day (TID) | ORAL | Status: DC

## 2017-01-20 MED ORDER — INSULIN ASPART 100 UNIT/ML ~~LOC~~ SOLN
0.0000 [IU] | SUBCUTANEOUS | Status: DC
  Administered 2017-01-20 (×2): 1 [IU] via SUBCUTANEOUS
  Administered 2017-01-20: 5 [IU] via SUBCUTANEOUS
  Administered 2017-01-20: 3 [IU] via SUBCUTANEOUS
  Administered 2017-01-20 – 2017-01-21 (×2): 1 [IU] via SUBCUTANEOUS

## 2017-01-20 NOTE — Progress Notes (Signed)
Responded to spiritual Care Consult to assist Patient with AD.  Left document with Patient to complete later per her request.  Spoke with patient nurse who share patient status and capacity.  Nurse will page Chaplain when patient is ready.  Jaclynn Major, [place], [facility], Pager [phone]

## 2017-01-20 NOTE — Progress Notes (Signed)
Paged provider on call at beginning of shift regarding patient transfer.  Throughout the day there was much discussion regarding patient's code status and goals of care. Floor patient was assigned to had previously refused to accept patient as she was too unstable for their floor with varying SBPs 60-90s.  Provider came to floor and verified that patient wishes to be DNR but IS NOT ready for comfort measures and wishes to live to be with family and her dogs.  Transfer discontinued to monitor patient closely. Will continue to monitor patient.

## 2017-01-20 NOTE — Progress Notes (Signed)
I had a long conversation with the patient and son about her very poor prognosis as due to her hypotension despite midodrine we cannot perform paracentesis which will help her with her breathing and probably help her with her renal dysfunction. I explained to her the benefits and risk of's CPR and she decided to be DNR/DNI.  and proceed with full comfort measures. At this point in time we will continue her on midodrine and octreotide, these will be DC'd once discharged from the hospital. She is meeting with hosp[facility] to make arrangements to go home with hospice hopefully within the next 24 hours.

## 2017-01-20 NOTE — Progress Notes (Signed)
[facility] [place] spoke with patient, informed service that she now wants to be full code.   RN informed attending, attending met with patient at bedside and per attending; pt is now in agreement for DNR.

## 2017-01-20 NOTE — Progress Notes (Signed)
Patient brought to radiology department for paracentesis.  Her SBP on arrival was in the 60s.  Despite 50g albumin and 10mg  midodrine she continued to have variable, but overall low BP ranging from 12-22 systolic.   Per the patient's request I did leave a voicemail for her son Linus Orn) notifying him of her admission.   She was returned to [facility].  No procedure today.  Discussed with Dr. Aileen Fass.   Brynda Greathouse, MS RD PA-C 10:15 AM

## 2017-01-20 NOTE — Care Management Note (Signed)
Case Management Note  Patient Details  Name: Natalie Lane MRN: [id number] Date of Birth: Sep 03, 1945  Subjective/Objective:    Pt admitted with SOB                Action/Plan:   PTA from home with boyfriend.  Pt has been active with [facility] since October 2018.  Pt wants to remain with agency at discharge.  Currently pt needs paracentesis however SBP ranging from 50-60's.  Palliative consulted for continued goals of care.  CM will continue to follow   Expected Discharge Date:                  Expected Discharge Plan:  Home w Hospice Care  In-House Referral:  Clinical Social Work  Discharge planning Services  CM Consult  Post Acute Care Choice:    Choice offered to:     DME Arranged:    DME Agency:     HH Arranged:    [place] Agency:     Status of Service:     If discussed at [employer], dates discussed:    Additional Comments:  Maryclare Labrador, RN 01/20/2017, 3:13 PM

## 2017-01-20 NOTE — Progress Notes (Signed)
Inpatient Diabetes Program Recommendations  AACE/ADA: New Consensus Statement on Inpatient Glycemic Control (2015)  Target Ranges:  Prepandial:   less than 140 mg/dL      Peak postprandial:   less than 180 mg/dL (1-2 hours)      Critically ill patients:  140 - 180 mg/dL   Lab Results  Component Value Date   GLUCAP 188 (H) 01/20/2017   HGBA1C 8.0 (H) 01/19/2017    Review of Glycemic Control  Diabetes history: DM2 Outpatient Diabetes medications: Novolin N 25 units in am and 15 units QHS, Amaryl 2 mg bid,  Current orders for Inpatient glycemic control: Levemir 5 units QHS, Novolog 0-9 units Q4H  Palliative care following.  Inpatient Diabetes Program Recommendations:     Increase Levemir 8 units bid  Will follow.  Thank you. Lorenda Peck, RD, LDN, CDE Inpatient Diabetes Coordinator [phone]

## 2017-01-20 NOTE — Progress Notes (Signed)
Called report to med surge floor who is refusing to accept patient based on her current blood pressure readings and patient not being a full comfort measure at this time, attending paged to be informed of this. Will continue to monitor patient.

## 2017-01-20 NOTE — Progress Notes (Signed)
Palliative Medicine RN Note:  During chart review, PMT NP Mariana Kaufman noticed payor source is [facility]. I called [facility] rep Amy; they were not notified of pt's trip to ED yesterday or admission to [facility] by family. She reports that [facility] has in place (at the pt's home) an out of facility DNR. This will likely be GIP, but it needs to be reviewed by their manager, as the patient/CG are requesting aggressive care. She will send a rep to [place] now to assist with [place]. Updated Leonard Downing, who is at bedside now.  Marjie Skiff Levar Fayson, RN, BSN, [facility] 01/20/2017 12:04 PM Cell [phone] 8:00-4:00 Monday-Friday Office [phone]

## 2017-01-20 NOTE — Progress Notes (Addendum)
[place] 2C 02 - [facility] GIP RN Visit  This is a related and covered GIP admission of 01/19/17 with [facility] diagnosis of Cirrhosis of Liver, per [facility] MD Dr. Tomasa Hosteller. Patient has an OOF DNR with [facility]. Patient activated EMS after an acute episode of SOB after gradually  worsening SOB over last several days.  Hospice was not notified of admission by family.  Patient was admitted to hospital for Cirrhosis of Liver with recurrent ascites.  Spoke with bedside RN who stated patient has been experiencing hypotension through the night which was not stable enough to have a paracentesis today. Visited with patient at bedside who was oriented, quietly resting with eyes closed in bed with no family/visitors at side. Patient awakens easily yet sleepy with noticeable SOB at rest on room air and labored breaths during conversation.  Patient stated "Very tired and has not been up OOB today and it's hard to catch my breath at times" Patient also verbalized that " my belly is uncomfortable and I am not very hungry" with eyes closing during our conservation.  Patient also stated that she is not "ready to give up and would like to pursue aggressive treatment while in the hospital and not sure of her desire to stay a DNR".  [facility] SW was notified of patient status and will make a patient visit today to confirm patient goals of care.  [facility] CM Sam and bedside RN also made aware of patient statements.  Patient abnormal labs include NA 129, K 5.3, Chl 98, CO2 18, Glucose 186, BUN 72, and Creat 2.63.  Pt currently on following medications: Amiodarone tablet 100 mg PO QD, Insulin (Novolog) sliding scale, Levemir 5units inj at bedtime, Chronulac 10grm/68ml (dose 20g) TID PO. Patient administered following prns in last 24 hours: Ativan 0.5mg  tab at 3244 today and Mylicon tab 160mg  dose at 0249 today.    Patient made aware that [facility] will continue to follow during hospital stay. Please contact [place] with any  questions and concerns.  Thank you, Gar Ponto, RN [place] now found on AMION  At [place] discussed patient care goals and confirmed patients wishes to be a Full Code but remain under [facility] care with comfort care needs only. Patient son came in halfway through visit.  Patient stated to SW " not ready to leave this world".  Bedside RN notified of patient wishes and will notify MD.   At [place] returned to patient bedside with son at side after MD had spoken with patient about interventions going forward at 1430. Patient considering options and still requests to be full code pending additional education about healthcare outcomes.  MD aware of SW conversation with patient and has returned to discuss code status with pt. Please call [facility] for further support or questions. Will continue to follow up on patient.    At 1505, MD notified [facility] that patient will stay at DNR status at this time.

## 2017-01-20 NOTE — Progress Notes (Signed)
MD paged over BP 84/47, MAP 57.  Will not treat as long as patient in asymptomatic.  Will continue to monitor.

## 2017-01-20 NOTE — Consult Note (Signed)
Consultation Note Date: 01/20/2017   Patient Name: Natalie Lane  DOB: Jun 19, 1945  MRN: [id number]  Age / Sex: 71 y.o., female  PCP: Carol Ada, MD Referring Physician: Aileen Fass, Tammi Klippel, MD  Reason for Consultation: Establishing goals of care  HPI/Patient Profile: 71 y.o. female  with past medical history of cirrhosis due to NASH, refractory ascites requiring weekly paracentesis, diabetes, diastolic heart failure, anemia, gastric varices, A fib admitted on 01/19/2017 with SOB and reaccumulating ascites. She was found to be hypotensive and admitted to the hospital for albumin infusion and paracentesis. This morning she was profoundly hypotensive and paracentesis was unable to be performed. Palliative medicine consulted for [place].   Clinical Assessment and Goals of Care:  I reviewed patient's chart and met with her at bedside.  She is currently enrolled with [facility]. She has a DNR in place. She would like this reinstated while she is here.  Her GOC are to be comfortable and and to die at home, spending as much time as she can with her family. She understands that she is at the end stages of her illness. I also contacted her son Nicole Kindred and discussed this with him as well.  Patient lives with her boyfriend, Rush Landmark. Nicole Kindred is able to assist with patient as long as she is able to remain in the home.  PMT RN Larina Bras has contacted [facility] rep and made them aware of this admission and they are to see patient and continue [place] discussion.   Patient is severely SOB and anxious. I discussed use of prn medications with patient's nurse for symptoms.   Primary Decision Maker PATIENT    SUMMARY OF RECOMMENDATIONS   -DNR -D/C home with Hospice- pt currently [facility] patient -Lorazepam .'5mg'$  po q6hr prn anxiety -Morphine concentrate 2.'5mg'$  sublingual q1hr prn for SOB  Code Status/Advance Care  Planning:  DNR   Palliative Prophylaxis:   Frequent Pain Assessment  Additional Recommendations (Limitations, Scope, Preferences):  Avoid Hospitalization  Prognosis:    < 6 months d/t cirrhosis r/t NASH  Discharge Planning: Home with Hospice  Primary Diagnoses: Present on Admission: . Atrial flutter ([place]) . Poorly controlled type 2 diabetes mellitus with peripheral neuropathy ([place]) . Hyperlipidemia . Cirrhosis of liver with ascites ([place]) . Esophageal varices ([place]) . Ascites . Hyperkalemia . GERD (gastroesophageal reflux disease) . Chronic diastolic CHF (congestive heart failure) ([place]) . Anemia   I have reviewed the medical record, interviewed the patient and family, and examined the patient. The following aspects are pertinent.  Past Medical History:  Diagnosis Date  . Anemia   . Ascites   . CHD (congenital heart disease)    a. 1979 s/p septal defect repair (? VSD vs ASD), old op notes not available;  b. 04/2016 Echo: EF 60-65%, no rwma, Gr2 DD, mild AS, mild TR, no PFO (prev repaired).  . Chronic diastolic CHF (congestive heart failure) ([place])    04/2016 Echo: EF 60-65%, no rwma, Gr2 DD.  Marland Kitchen Complication of anesthesia   . Cryptogenic cirrhosis (  [place])    a. 04/2016 s/p paracentesis.  . Diabetes mellitus, type 2 ([name])   . Dyspnea   . Edema   . Gastric varices    a. s/p banding.  Marland Kitchen GERD (gastroesophageal reflux disease)   . History of blood transfusion   . Hyperlipidemia   . Normal stress echocardiogram 2008  . Pancreatic adenoma   . Paroxysmal atrial flutter ([place])    a. 11/2009 s/p RFCA.  Marland Kitchen PONV (postoperative nausea and vomiting)    with Cholecystectomy  . Tobacco abuse    a. smokes 4 cigarettes/day.   Social History   Socioeconomic History  . Marital status: Married    Spouse name: None  . Number of children: 3  . Years of education: None  . Highest education level: None  Social Needs  . Financial resource strain: None  . Food insecurity - worry:  None  . Food insecurity - inability: None  . Transportation needs - medical: None  . Transportation needs - non-medical: None  Occupational History  . Occupation: Journalist, newspaper: [employer]  Tobacco Use  . Smoking status: Former Smoker    Packs/day: 0.50    Years: 36.00    Pack years: 18.00    Types: Cigarettes    Last attempt to quit: 02/16/2016    Years since quitting: 0.9  . Smokeless tobacco: Never Used  . Tobacco comment: was smoking 4 cigs/day prior to admission.  Substance and Sexual Activity  . Alcohol use: No  . Drug use: No  . Sexual activity: None  Other Topics Concern  . None  Social History Narrative   Lives in [place] with boyfriend.  Does not routinely exercise.   Family History  Problem Relation Age of Onset  . Hypertension Mother   . Kidney disease Sister   . Depression Paternal Grandfather   . Diabetes Brother    Scheduled Meds: . amiodarone  100 mg Oral Daily  . famotidine  20 mg Oral Daily  . insulin aspart  0-9 Units Subcutaneous Q4H  . insulin detemir  5 Units Subcutaneous QHS  . lactulose  20 g Oral TID  . mouth rinse  15 mL Mouth Rinse BID   Continuous Infusions: . albumin human     PRN Meds:.LORazepam, morphine CONCENTRATE, simethicone Medications Prior to Admission:  Prior to Admission medications   Medication Sig Start Date End Date Taking? Authorizing Provider  amiodarone (PACERONE) 100 MG tablet Take 1 tablet (100 mg total) by mouth daily. 11/22/16  Yes Georgette Shell, MD  furosemide (LASIX) 80 MG tablet Take 1 tablet (80 mg total) by mouth 2 (two) times daily. 09/05/16  Yes Velvet Bathe, MD  glimepiride (AMARYL) 2 MG tablet Take 1 tablet (2 mg total) by mouth 2 (two) times daily before a meal. 11/12/16  Yes Philemon Kingdom, MD  insulin NPH Human (NOVOLIN N) 100 UNIT/ML injection Inject 20 units twice daily before meals. Patient taking differently: Inject 15-25 Units into the skin See admin instructions. Inject 25  units into the skin in the morning and 15 units into the skin at night 08/10/16  Yes Elayne Snare, MD  lactulose (CHRONULAC) 10 GM/15ML solution Take 30 mLs (20 g total) by mouth 3 (three) times daily. 09/05/16  Yes Velvet Bathe, MD  LORazepam (ATIVAN) 0.5 MG tablet Take 0.5 mg by mouth every 6 (six) hours as needed for anxiety.   Yes [provider]  ondansetron (ZOFRAN ODT) 4 MG disintegrating tablet Take 1  tablet (4 mg total) by mouth every 8 (eight) hours as needed for nausea or vomiting. 12/04/16  Yes Caccavale, Sophia, PA-C  prochlorperazine (COMPAZINE) 10 MG tablet Take 10 mg by mouth every 6 (six) hours as needed for nausea/vomiting. 12/14/16  Yes [provider]  ranitidine (ZANTAC) 150 MG capsule Take 150 mg by mouth daily as needed for heartburn.   Yes [provider]  Simethicone (GAS-X PO) Take 1 tablet by mouth daily as needed (gas).   Yes [provider]  spironolactone (ALDACTONE) 50 MG tablet Take 50 mg by mouth daily.  09/15/16  Yes [provider]  glucose blood (ONE TOUCH ULTRA TEST) test strip Use as instructed to check sugar 2 times daily   Dx- E11.42 Patient not taking: Reported on 11/19/2016 10/04/16   Philemon Kingdom, MD  Insulin Pen Needle (CAREFINE PEN NEEDLES) 31G X 6 MM MISC Use to inject insulin 2 times daily Patient not taking: Reported on 11/19/2016 11/16/16   Philemon Kingdom, MD   Allergies  Allergen Reactions  . Monosodium Glutamate Anaphylaxis    MSG  . Red Dye Anaphylaxis  . Erythromycin Other (See Comments)    Reaction:  Migraine   . Penicillins Other (See Comments)    Reaction:  Migraine  Has patient had a PCN reaction causing immediate rash, facial/tongue/throat swelling, SOB or lightheadedness with hypotension: No Has patient had a PCN reaction causing severe rash involving mucus membranes or skin necrosis: No Has patient had a PCN reaction that required hospitalization No Has patient had a PCN reaction  occurring within the last 10 years: No If all of the above answers are "NO", then may proceed with Cephalosporin use.  . Statins Other (See Comments)    Reaction:  Severe leg cramps   . Sulfa Antibiotics Hives  . Glipizide Rash  . Iohexol Rash  . Tape Rash    "caused blisters"   Review of Systems  Constitutional: Positive for fatigue.  Respiratory: Positive for shortness of breath.   Psychiatric/Behavioral: The patient is nervous/anxious.     Physical Exam  Constitutional: She appears distressed.  Cardiovascular:  tachycardic  Pulmonary/Chest:  Tachypneic, labored  Abdominal: She exhibits ascites. There is tenderness.  Skin: Rash noted.  Psychiatric: Her mood appears anxious.  Nursing note and vitals reviewed.   Vital Signs: BP 96/69 (BP Location: Left Arm)   Pulse 76   Temp 97.7 F (36.5 C) (Oral)   Resp (!) 21   Ht '5\' 3"'$  (1.6 m)   Wt 81.8 kg (180 lb 5.4 oz)   SpO2 97%   BMI 31.95 kg/m  Pain Assessment: No/denies pain   Pain Score: Asleep   SpO2: SpO2: 97 % O2 Device:SpO2: 97 % O2 Flow Rate: .O2 Flow Rate (L/min): 2 L/min  IO: Intake/output summary:   Intake/Output Summary (Last 24 hours) at 01/20/2017 1401 Last data filed at 01/20/2017 0317 Gross per 24 hour  Intake 1135 ml  Output -  Net 1135 ml    LBM:   Baseline Weight: Weight: 72.6 kg (160 lb) Most recent weight: Weight: 81.8 kg (180 lb 5.4 oz)     Palliative Assessment/Data: PPS: 40%     Thank you for this consult. Palliative medicine will continue to follow and assist as needed.   Time In:1115 Time Out: 1230 Time Total: 75 minutes Greater than 50%  of this time was spent counseling and coordinating care related to the above assessment and plan.  Signed by: Mariana Kaufman, AGNP-C Palliative Medicine  Please contact Palliative Medicine Team phone at [phone] for questions and concerns.  For individual provider: See Shea Evans

## 2017-01-20 NOTE — Progress Notes (Signed)
CRITICAL VALUE ALERT  Critical Value:  K. 6.4  Date & Time Notied:  01/20/17 0207  Provider Notified: Silas [place]  Orders Received/Actions taken: orders received

## 2017-01-20 NOTE — Progress Notes (Signed)
[facility] Gastroenterology Progress Note  Natalie Lane 71 y.o. 09-14-1945   Subjective: Resting in bed. Denies abdominal pain.  Objective: Vital signs: Vitals:   01/20/17 1002 01/20/17 1003  BP: (!) 53/37 (!) 66/43  Pulse:    Resp: 15   Temp:    SpO2: 100%   T 98.5, P 74  Physical Exam: Gen: lethargic, elderly, mild acute distress  HEENT: anicteric sclera CV: RRR Chest: CTA B Abd: +distention, diffusely tender with guarding, decreased bowel sounds Ext: no edema  Lab Results: Recent Labs    01/20/17 0104 01/20/17 0648  NA 126* 128*  K 6.4* 6.0*  CL 97* 100*  CO2 18* 16*  GLUCOSE 339* 263*  BUN 75* 75*  CREATININE 2.78* 2.60*  CALCIUM 8.9 8.6*   Recent Labs    01/19/17 1420 01/20/17 0104  AST 27 28  ALT 25 22  ALKPHOS 84 71  BILITOT 1.0 0.6  PROT 6.2* 5.1*  ALBUMIN 3.2* 2.8*   Recent Labs    01/19/17 1420 01/20/17 0104  WBC 6.9 7.2  NEUTROABS 5.1  --   HGB 13.1 10.7*  HCT 38.0 31.8*  MCV 90.3 91.1  PLT 254 225      Assessment/Plan: Decompensated cirrhosis with refractory ascites and worsening renal function, hyperkalemia. Poor prognosis. Planned paracentesis not done due to hypotension. Recommend palliative care/comfort care measures. She has been hesitant to do hospice in the past but seems more open to it now. Appreciate the wonderful care of Dr. Aileen Fass in taking care of Natalie Lane.   Williams C. 01/20/2017, 10:58 AM  Pager [phone]  AFTER 5 PM or on weekends please call [phone] ID: Natalie Lane, female   DOB: 06/24/1945, 71 y.o.   MRN: [id number]

## 2017-01-20 NOTE — Progress Notes (Signed)
[facility] PROGRESS NOTE    Progress Note  Natalie Lane  NFA:[id number] DOB: 03/20/1945 DOA: 01/19/2017 PCP: Carol Ada, MD     Brief Narrative:   Natalie Lane is an 71 y.o. female past medical history of cirrhosis and ascites due to [facility] with recurrent paracentesis for she has required frequent admissions, anemia, gastric varices and a flutter on amiodarone not on anticoagulation who had her last paracentesis a week ago comes in as she has had re-accumulation of her fluids increasing her weight  Assessment/Plan:   Cirrhosis of liver with recurrent ascites  due to [facility] IR has been consulted for possible paracentesis. We will give IV albumin with paracentesis. A long discussion with the patient, she seems to be progressively getting worse, she has had to her paracentesis more frequently it seems like her cirrhosis is progressing. She agreed to meet with palliative care.  Acute hyperkalemia: She was given a dose of Kayexalate x2. Her potassium is still 6.4 (was 6.9), she is becoming mildly acidotic which could be contributing to her hyperkalemia. EKG showed no peaked T waves.  Acute on chronic renal disease stage III: Baseline creatinine 2.2-2.4, at home she is on Lasix and Aldactone despite this she is having very little urine output. It seems like her ascites is resistance to diuretic We will give insulin and recheck potassium this afternoon. Question if this may be hepatorenal syndrome.  Will check urinary sodium she had no red cells on urinalysis.  Poorly controlled type 2 diabetes mellitus with peripheral neuropathy (HCC) Insulin-dependent A1c is pending, blood glucose is going up we will start on long-acting insulin plus sliding scale this should help with metabolic acidosis.  Non-anion gap metabolic acidosis: Likely due to renal disease.  Hx of  atrial flutter ([place]): Cont amiodarone.  Esophageal varices (HCC) No signs of bleeding.   DVT  prophylaxis: SCD Family Communication:none Disposition Plan/Barrier to D/C: unable to determine Code Status:     Code Status Orders  (From admission, onward)        Start     Ordered   01/19/17 1716  Full code  Continuous     01/19/17 1719    Code Status History    Date Active Date Inactive Code Status Order ID Comments User Context   11/19/2016 17:24 11/21/2016 15:52 Full Code [id number]  Georgette Shell, MD ED   09/02/2016 19:52 09/05/2016 17:45 Full Code [id number]  Vianne Bulls, MD ED   08/23/2016 21:11 08/25/2016 14:47 Full Code [id number]  Etta Quill, DO ED   05/10/2016 20:01 05/12/2016 19:03 Full Code [id number]  Modena Jansky, MD Inpatient   08/31/2011 23:59 09/02/2011 19:07 Full Code [id number]  Porfirio Mylar, RN Inpatient        IV Access:    Peripheral IV   Procedures and diagnostic studies:   Dg Chest 2 View  Result Date: 01/19/2017 CLINICAL DATA:  Shortness of breath over the last week. Cirrhosis of the liver and congestive heart failure. EXAM: CHEST  2 VIEW COMPARISON:  11/19/2016 FINDINGS: Previous median sternotomy. Normal heart size. Normal mediastinal shadows. Chronic scarring at the lung bases. No sign of active infiltrate, mass, effusion or level are collapse. No significant bone finding. IMPRESSION: Chronic basilar pulmonary scarring.  No active process identified. Electronically Signed   By: Nelson Chimes M.D.   On: 01/19/2017 15:12     Medical Consultants:    None.  Anti-Infectives:    Subjective:    [name]  she relates her breathing is about the same.  Objective:    Vitals:   01/20/17 0431 01/20/17 0437 01/20/17 0500 01/20/17 0616  BP:   90/60 (!) 87/50  Pulse:      Resp:   15 (!) 21  Temp:  98.4 F (36.9 C)    TempSrc:  Oral    SpO2:   97% 98%  Weight: 81.8 kg (180 lb 5.4 oz)     Height:        Intake/Output Summary (Last 24 hours) at 01/20/2017 0748 Last data filed at 01/20/2017 0317 Gross per 24 hour    Intake 1135 ml  Output -  Net 1135 ml   Filed Weights   01/19/17 1357 01/20/17 0431  Weight: 72.6 kg (160 lb) 81.8 kg (180 lb 5.4 oz)    Exam: General exam: In no acute distress. Respiratory system: Good air movement and clear to auscultation. Cardiovascular system: S1 & S2 heard, RRR. Gastrointestinal system: Significantly distended abdomen, no rebound or guarding. Central nervous system: Alert and oriented. No focal neurological deficits. Extremities: No pedal edema. Skin: No rashes, lesions or ulcers Psychiatry: Judgement and insight appear normal. Mood & affect appropriate.    Data Reviewed:    Labs: Basic Metabolic Panel: Recent Labs  Lab 01/19/17 1420 01/19/17 1946 01/20/17 0104  NA 126* 128* 126*  K 6.3* 6.9* 6.4*  CL 97* 96* 97*  CO2 20* 18* 18*  GLUCOSE 225* 142* 339*  BUN 74* 76* 75*  CREATININE 2.57* 2.58* 2.78*  CALCIUM 9.3 9.4 8.9   GFR Estimated Creatinine Clearance: 19.1 mL/min (A) (by C-G formula based on SCr of 2.78 mg/dL (H)). Liver Function Tests: Recent Labs  Lab 01/19/17 1420 01/20/17 0104  AST 27 28  ALT 25 22  ALKPHOS 84 71  BILITOT 1.0 0.6  PROT 6.2* 5.1*  ALBUMIN 3.2* 2.8*   No results for input(s): LIPASE, AMYLASE in the last 168 hours. No results for input(s): AMMONIA in the last 168 hours. Coagulation profile Recent Labs  Lab 01/20/17 0104  INR 1.15    CBC: Recent Labs  Lab 01/19/17 1420 01/20/17 0104  WBC 6.9 7.2  NEUTROABS 5.1  --   HGB 13.1 10.7*  HCT 38.0 31.8*  MCV 90.3 91.1  PLT 254 225   Cardiac Enzymes: No results for input(s): CKTOTAL, CKMB, CKMBINDEX, TROPONINI in the last 168 hours. BNP (last 3 results) No results for input(s): PROBNP in the last 8760 hours. CBG: Recent Labs  Lab 01/19/17 2137  GLUCAP 275*   D-Dimer: No results for input(s): DDIMER in the last 72 hours. Hgb A1c: Recent Labs    01/19/17 1946  HGBA1C 8.0*   Lipid Profile: No results for input(s): CHOL, HDL, LDLCALC,  TRIG, CHOLHDL, LDLDIRECT in the last 72 hours. Thyroid function studies: No results for input(s): TSH, T4TOTAL, T3FREE, THYROIDAB in the last 72 hours.  Invalid input(s): FREET3 Anemia work up: No results for input(s): VITAMINB12, FOLATE, FERRITIN, TIBC, IRON, RETICCTPCT in the last 72 hours. Sepsis Labs: Recent Labs  Lab 01/19/17 1420 01/20/17 0104  WBC 6.9 7.2   Microbiology Recent Results (from the past 240 hour(s))  MRSA PCR Screening     Status: None   Collection Time: 01/19/17  6:58 PM  Result Value Ref Range Status   MRSA by PCR NEGATIVE NEGATIVE Final    Comment:        The GeneXpert MRSA Assay (FDA approved for NASAL specimens only), is one component of a comprehensive MRSA colonization  surveillance program. It is not intended to diagnose MRSA infection nor to guide or monitor treatment for MRSA infections.      Medications:   . amiodarone  100 mg Oral Daily  . famotidine  20 mg Oral Daily  . insulin aspart  0-9 Units Subcutaneous TID WC  . lactulose  20 g Oral TID  . mouth rinse  15 mL Mouth Rinse BID   Continuous Infusions: . albumin human       LOS: 1 day   Charlynne Cousins  [facility] Pager [phone]  *Please refer to [email or web address], password [email or web address] to get updated schedule on who will round on this patient, as hospitalists switch teams weekly. If 7PM-7AM, please contact night-coverage at [email or web address], password [email or web address] for any overnight needs.  01/20/2017, 7:48 AM

## 2017-01-21 LAB — GLUCOSE, CAPILLARY
GLUCOSE-CAPILLARY: 212 mg/dL — AB (ref 65–99)
Glucose-Capillary: 126 mg/dL — ABNORMAL HIGH (ref 65–99)
Glucose-Capillary: 144 mg/dL — ABNORMAL HIGH (ref 65–99)

## 2017-01-21 LAB — BASIC METABOLIC PANEL
ANION GAP: 11 (ref 5–15)
BUN: 71 mg/dL — AB (ref 6–20)
CHLORIDE: 101 mmol/L (ref 101–111)
CO2: 17 mmol/L — ABNORMAL LOW (ref 22–32)
Calcium: 8.6 mg/dL — ABNORMAL LOW (ref 8.9–10.3)
Creatinine, Ser: 2.58 mg/dL — ABNORMAL HIGH (ref 0.44–1.00)
GFR calc Af Amer: 21 mL/min — ABNORMAL LOW (ref >60)
GFR, EST NON AFRICAN AMERICAN: 18 mL/min — AB (ref >60)
GLUCOSE: 133 mg/dL — AB (ref 65–99)
POTASSIUM: 4.8 mmol/L (ref 3.5–5.1)
Sodium: 129 mmol/L — ABNORMAL LOW (ref 135–145)

## 2017-01-21 MED ORDER — LORAZEPAM 0.5 MG PO TABS: 0.5000 mg | ORAL_TABLET | Freq: Four times a day (QID) | ORAL | 0 refills | Status: AC | PRN

## 2017-01-21 MED ORDER — MORPHINE SULFATE (CONCENTRATE) 10 MG /0.5 ML PO SOLN: 10.0000 mg | ORAL | 0 refills | Status: AC | PRN

## 2017-01-21 MED ORDER — ACETAMINOPHEN 325 MG PO TABS
650.0000 mg | ORAL_TABLET | Freq: Four times a day (QID) | ORAL | Status: DC | PRN
  Administered 2017-01-21: 650 mg via ORAL
  Filled 2017-01-21: qty 2

## 2017-01-21 NOTE — Care Management Note (Addendum)
Case Management Note  Patient Details  Name: Natalie Lane MRN: [id number] Date of Birth: [birth year]  Subjective/Objective:    Pt admitted with SOB                Action/Plan:   PTA from home with boyfriend.  Pt has been active with [facility] since October 2018.  Pt wants to remain with agency at discharge.  Currently pt needs paracentesis however SBP ranging from 50-60's.  Palliative consulted for continued goals of care.  CM will continue to follow   Expected Discharge Date:  01/21/17               Expected Discharge Plan:  Home w Hospice Care  In-House Referral:  Clinical Social Work  Discharge planning Services  CM Consult  Post Acute Care Choice:    Choice offered to:     DME Arranged:    DME Agency:     HH Arranged:    HH Agency:     Status of Service:     If discussed at [employer], dates discussed:    Additional Comments: 01/21/2017  CM arranged for EMS to transport pt home today - ETA 1pm.  CM contacted both [facility] and [name] and informed of ETA of transport.    Pt to discharge home today to care of boyfriend [name].  [facility] will resume home hospice service at discharge - aware of discharge today and will reinitiate services today.  Pt remains distended and requested ambulance transport - EMS has contract with hospice.  Per pt and [facility] pt has all required equipment in the home Maryclare Labrador, [place] 01/21/2017, 11:29 AM

## 2017-01-21 NOTE — Progress Notes (Signed)
[place] met with Patient.  She stated she was looking forward to going home to be with her dogs.  SW and RN talked to her about her code status and she stated she understands and wants to remain a DNR.  She did get tearful and feels devastated about her situation.  SW offered supportive listening during visit and will follow up once she gets home.  Cecilio Asper, [place] [facility] [place]

## 2017-01-21 NOTE — Progress Notes (Addendum)
[place] 2C 02 - [facility] GIP RN Visit  This is a related and covered GIP admission of 01/19/17 with [facility] diagnosis of Cirrhosis of Liver, per [facility] MD Dr. Tomasa Hosteller. Patient has an OOF DNR with [facility]. Patient activated EMS after an acute episode of SOB after gradually  worsening SOB over last several days.  Hospice was not notified of admission by family.  Patient was admitted to hospital for Cirrhosis of Liver with recurrent ascites.  Spoke with bedside RN who stated patient has been experiencing continued hypotension which has made a paracentesis to relieve abdominal distention as an unavailable option at this time. MD has discharged patient home with hospice support per patient wishes to be at home with dogs and caregiver.    Visited patient with [place]. Pt alert and oriented, sitting with HOB at 40 degrees with  no family/visitors at side. Patient is  SOB at rest on room air with labored breaths during conversation.  Patient stated that she has been able to ambulate to the BR with minimal assistance but it tires me out".  Patient also verbalized that " my belly is uncomfortable" and rates pain as a 8 on scale of 10 - bedside RN made aware. Patient confirmed desire to continue DNR status and go home with comfort care measures only. Patient was teary during visit and encouraged patient to voice concerns, listened/supported and initiated education on EOL plan of care - patient verbalized understanding.   Writer contacted [facility] and POA to make aware of discharge plans for today, gave information on patient status and [facility] 24 hour patient triage line and [facility] follow up for today and this weekend.   Patient abnormal labs include NA 129, CO2 17, Glucose 133, BUN 71, and Creat 2.58.  Pt currently on following medications: Amiodarone tablet 100 mg PO QD, Insulin (Novolog) sliding scale, Levemir 5units inj at bedtime, Chronulac 10grm/24ml (dose 20g) TID PO. Patient administered  following prns in last 24 hours: Tylenol 650mg  tab PO at 0356 today and is currently on no continuous medications.    Please contact [place] with any questions and concerns.  Thank you, Gar Ponto, RN [place] now found on AMION  At 1121 - Made bedside RN and [facility] aware of information above.

## 2017-01-21 NOTE — Discharge Summary (Signed)
Physician Discharge Summary  Natalie Lane PZW:[id number] DOB: 1945-05-21 DOA: 01/19/2017  PCP: Carol Ada, MD  Admit date: 01/19/2017 Discharge date: 01/21/2017  Admitted From: Home Disposition:  Home  Recommendations for Outpatient Follow-up:  1. [facility] follow-up patient. 2. Full comfort care  Home Health:no Equipment/Devices:none  Discharge Condition:stable CODE STATUS:DNR Diet recommendation: Heart Healthy  Brief/Interim Summary: 71 y.o. female past medical history of cirrhosis and ascites due to [facility] with recurrent paracentesis for she has required frequent admissions, anemia, gastric varices and a flutter on amiodarone not on anticoagulation who had her last paracentesis a week ago comes in as she has had re-accumulation of her fluids increasing her weight    Discharge Diagnoses:  Active Problems:   Atrial flutter (HCC)   CHD (congenital heart disease)   Hyperlipidemia   Poorly controlled type 2 diabetes mellitus with peripheral neuropathy (HCC)   Cirrhosis of liver with ascites (HCC)   Esophageal varices (HCC)   Ascites   Hyperkalemia   GERD (gastroesophageal reflux disease)   Diabetes mellitus, type 2 (HCC)   Chronic diastolic CHF (congestive heart failure) ([place])   Anemia   Advance care planning   Goals of care, counseling/discussion   Palliative care by specialist  Cirrhosis of liver with recurrent ascites  due to [facility].: IR has been consulted for possible paracentesis, this could not be performed to to severe hypotension. Despite albumin and holding her diuretics her blood pressure remained low. A GI doctor was consulted I had a long discussion with the patient and so did the palliative care team. And explained to her that her condition had progressed rapidly and there were limited options we could offer her. She related that she would go like to go home and be with her family and dogs. Appreciate the palliative care team, we have put her on  lorazepam and morphine for comfort.  Acute hyperkalemia: She was given a dose of Kayexalate x2. And her potassium improved to 4.8.  Acute on chronic renal disease stage III likely due to hepatorenal syndrome: Baseline creatinine 2.2-2.4, at home she is on Lasix and Aldactone despite this she is having very little urine output. It seems like her ascites is resistance to diuretic, her GI doctor was consulted and he agreed to move towards hospice and comfort care. Despite midodrine and off to try she continued to remain hypotensive. Her creatinine remained at 2.5, hospice and palliative care team was consulted and she agreed to move towards comfort care.   Poorly controlled type 2 diabetes mellitus with peripheral neuropathy ([place]) All over glycemic medications were DC'd.  Non-anion gap metabolic acidosis: Likely due to renal disease, despite aggressive treatment her non-anion gap metabolic acidosis continues to progress.  Hx of  atrial flutter ([place]): Cont amiodarone.  Esophageal varices (HCC) No signs of bleeding.   Discharge Instructions  Discharge Instructions    Diet - low sodium heart healthy   Complete by:  As directed    Increase activity slowly   Complete by:  As directed      Allergies as of 01/21/2017      Reactions   Monosodium Glutamate Anaphylaxis   MSG   Red Dye Anaphylaxis   Erythromycin Other (See Comments)   Reaction:  Migraine    Penicillins Other (See Comments)   Reaction:  Migraine  Has patient had a PCN reaction causing immediate rash, facial/tongue/throat swelling, SOB or lightheadedness with hypotension: No Has patient had a PCN reaction causing severe rash involving mucus membranes  or skin necrosis: No Has patient had a PCN reaction that required hospitalization No Has patient had a PCN reaction occurring within the last 10 years: No If all of the above answers are "NO", then may proceed with Cephalosporin use.   Statins Other (See Comments)    Reaction:  Severe leg cramps    Sulfa Antibiotics Hives   Glipizide Rash   Iohexol Rash   Tape Rash   "caused blisters"      Medication List    STOP taking these medications   GAS-X PO   glimepiride 2 MG tablet Commonly known as:  AMARYL   glucose blood test strip Commonly known as:  ONE TOUCH ULTRA TEST   insulin NPH Human 100 UNIT/ML injection Commonly known as:  NOVOLIN N   Insulin Pen Needle 31G X 6 MM Misc Commonly known as:  CAREFINE PEN NEEDLES   lactulose 10 GM/15ML solution Commonly known as:  CHRONULAC   ondansetron 4 MG disintegrating tablet Commonly known as:  ZOFRAN ODT   prochlorperazine 10 MG tablet Commonly known as:  COMPAZINE   ranitidine 150 MG capsule Commonly known as:  ZANTAC   spironolactone 50 MG tablet Commonly known as:  ALDACTONE     TAKE these medications   amiodarone 100 MG tablet Commonly known as:  PACERONE Take 1 tablet (100 mg total) by mouth daily.   furosemide 80 MG tablet Commonly known as:  LASIX Take 1 tablet (80 mg total) by mouth 2 (two) times daily.   LORazepam 0.5 MG tablet Commonly known as:  ATIVAN Take 1 tablet (0.5 mg total) by mouth every 6 (six) hours as needed for anxiety.   morphine CONCENTRATE 10 mg / 0.5 ml concentrated solution Take 0.5 mLs (10 mg total) by mouth every 2 (two) hours as needed for severe pain or shortness of breath.       Allergies  Allergen Reactions  . Monosodium Glutamate Anaphylaxis    MSG  . Red Dye Anaphylaxis  . Erythromycin Other (See Comments)    Reaction:  Migraine   . Penicillins Other (See Comments)    Reaction:  Migraine  Has patient had a PCN reaction causing immediate rash, facial/tongue/throat swelling, SOB or lightheadedness with hypotension: No Has patient had a PCN reaction causing severe rash involving mucus membranes or skin necrosis: No Has patient had a PCN reaction that required hospitalization No Has patient had a PCN reaction occurring within the  last 10 years: No If all of the above answers are "NO", then may proceed with Cephalosporin use.  . Statins Other (See Comments)    Reaction:  Severe leg cramps   . Sulfa Antibiotics Hives  . Glipizide Rash  . Iohexol Rash  . Tape Rash    "caused blisters"    Consultations:  Gastroenterology  Palliative care   Procedures/Studies: Dg Chest 2 View  Result Date: 01/19/2017 CLINICAL DATA:  Shortness of breath over the last week. Cirrhosis of the liver and congestive heart failure. EXAM: CHEST  2 VIEW COMPARISON:  11/19/2016 FINDINGS: Previous median sternotomy. Normal heart size. Normal mediastinal shadows. Chronic scarring at the lung bases. No sign of active infiltrate, mass, effusion or level are collapse. No significant bone finding. IMPRESSION: Chronic basilar pulmonary scarring.  No active process identified. Electronically Signed   By: Nelson Chimes M.D.   On: 01/19/2017 15:12   Ir Paracentesis  Result Date: 01/12/2017 INDICATION: Abdominal distention secondary to recurrent ascites. Request therapeutic paracentesis up tonight L max. EXAM: ULTRASOUND  GUIDED RIGHT LOWER QUADRANT PARACENTESIS MEDICATIONS: None. COMPLICATIONS: None immediate. PROCEDURE: Informed written consent was obtained from the patient after a discussion of the risks, benefits and alternatives to treatment. A timeout was performed prior to the initiation of the procedure. Initial ultrasound scanning demonstrates a large amount of ascites within the right lower abdominal quadrant. The right lower abdomen was prepped and draped in the usual sterile fashion. 1% lidocaine with epinephrine was used for local anesthesia. Following this, a 19 gauge, 7-cm, Yueh catheter was introduced. An ultrasound image was saved for documentation purposes. The paracentesis was performed. The catheter was removed and a dressing was applied. The patient tolerated the procedure well without immediate post procedural complication. FINDINGS: A  total of approximately 9 L of milky yellow fluid was removed. IMPRESSION: Successful ultrasound-guided paracentesis yielding 9 liters of peritoneal fluid. Read by: Ascencion Dike PA-C Electronically Signed   By: Sandi Mariscal M.D.   On: 01/12/2017 11:52   Ir Paracentesis  Result Date: 12/30/2016 INDICATION: Patient with recurrent ascites. Request is made for therapeutic paracentesis of up to 9 liters. The patient to receive 50 g albumin with procedure today. EXAM: ULTRASOUND GUIDED THERAPEUTIC PARACENTESIS MEDICATIONS: 10 mL 2% lidocaine COMPLICATIONS: None immediate. PROCEDURE: Informed written consent was obtained from the patient after a discussion of the risks, benefits and alternatives to treatment. A timeout was performed prior to the initiation of the procedure. Initial ultrasound scanning demonstrates a large amount of ascites within the right lateral abdomen. The right lateral abdomen was prepped and draped in the usual sterile fashion. 2% lidocaine was used for local anesthesia. Following this, a 19 gauge, 7-cm, Yueh catheter was introduced. An ultrasound image was saved for documentation purposes. The paracentesis was performed. The catheter was removed and a dressing was applied. The patient tolerated the procedure well without immediate post procedural complication. FINDINGS: A total of approximately 5.7 liters of chylous fluid was removed. Patient experienced a drop in her blood pressure. Procedure was stopped and patient was given a of 500 mL fluid bolus which resulted in improvement in her blood pressure. Procedure was stopped prior to removal of all fluid. IMPRESSION: Successful ultrasound-guided therapeutic paracentesis yielding 5.7 liters of peritoneal fluid. Read by:  Brynda Greathouse PA-C Electronically Signed   By: Markus Daft M.D.   On: 12/30/2016 16:06   Ir Paracentesis  Result Date: 12/23/2016 INDICATION: Patient with recurrent ascites. Request is made for therapeutic paracentesis of up  to 9 L. Patient to receive albumin postprocedure. EXAM: ULTRASOUND GUIDED THERAPEUTIC PARACENTESIS MEDICATIONS: 10 mL 2% lidocaine COMPLICATIONS: None immediate. PROCEDURE: Informed written consent was obtained from the patient after a discussion of the risks, benefits and alternatives to treatment. A timeout was performed prior to the initiation of the procedure. Initial ultrasound scanning demonstrates a large amount of ascites within the right lateral abdomen. The right lower abdomen was prepped and draped in the usual sterile fashion. 2% lidocaine was used for local anesthesia. Following this, a 19 gauge, 7-cm, Yueh catheter was introduced. An ultrasound image was saved for documentation purposes. The paracentesis was performed. The catheter was removed and a dressing was applied. The patient tolerated the procedure well without immediate post procedural complication. FINDINGS: A total of approximately 9.0 liters of chylous fluid was removed. IMPRESSION: Successful ultrasound-guided therapeutic paracentesis yielding 9.0 liters of peritoneal fluid. Patient received 50 g of albumin with procedure today. Read by:  Brynda Greathouse PA-C Electronically Signed   By: Corrie Mckusick D.O.   On: 12/23/2016 14:15  Subjective: Is not uncomfortable, She would like to go home.  Discharge Exam: Vitals:   01/21/17 0300 01/21/17 0341  BP: (!) 85/47   Pulse: 78 79  Resp: 16   Temp:  98.3 F (36.8 C)  SpO2: 93%    Vitals:   01/21/17 0100 01/21/17 0200 01/21/17 0300 01/21/17 0341  BP: (!) 100/52 (!) 93/54 (!) 85/47   Pulse: 76 78 78 79  Resp: 14 13 16    Temp:    98.3 F (36.8 C)  TempSrc:    Oral  SpO2: 96% 92% 93%   Weight:    82.1 kg (180 lb 16 oz)  Height:        General: Pt is alert, awake, not in acute distress Cardiovascular: RRR, S1/S2 +, no rubs, no gallops Respiratory: CTA bilaterally, no wheezing, no rhonchi Abdominal: Soft, NT, ND, bowel sounds + Extremities: no edema, no  cyanosis    The results of significant diagnostics from this hospitalization (including imaging, microbiology, ancillary and laboratory) are listed below for reference.     Microbiology: Recent Results (from the past 240 hour(s))  MRSA PCR Screening     Status: None   Collection Time: 01/19/17  6:58 PM  Result Value Ref Range Status   MRSA by PCR NEGATIVE NEGATIVE Final    Comment:        The GeneXpert MRSA Assay (FDA approved for NASAL specimens only), is one component of a comprehensive MRSA colonization surveillance program. It is not intended to diagnose MRSA infection nor to guide or monitor treatment for MRSA infections.      Labs: BNP (last 3 results) Recent Labs    05/10/16 1431 06/25/16 1658 11/19/16 1500  BNP 114.7* 148.4* 86.5   Basic Metabolic Panel: Recent Labs  Lab 01/19/17 1946 01/20/17 0104 01/20/17 0648 01/20/17 1230 01/21/17 0335  NA 128* 126* 128* 129* 129*  K 6.9* 6.4* 6.0* 5.3* 4.8  CL 96* 97* 100* 98* 101  CO2 18* 18* 16* 18* 17*  GLUCOSE 142* 339* 263* 186* 133*  BUN 76* 75* 75* 72* 71*  CREATININE 2.58* 2.78* 2.60* 2.63* 2.58*  CALCIUM 9.4 8.9 8.6* 9.0 8.6*   Liver Function Tests: Recent Labs  Lab 01/19/17 1420 01/20/17 0104  AST 27 28  ALT 25 22  ALKPHOS 84 71  BILITOT 1.0 0.6  PROT 6.2* 5.1*  ALBUMIN 3.2* 2.8*   No results for input(s): LIPASE, AMYLASE in the last 168 hours. No results for input(s): AMMONIA in the last 168 hours. CBC: Recent Labs  Lab 01/19/17 1420 01/20/17 0104  WBC 6.9 7.2  NEUTROABS 5.1  --   HGB 13.1 10.7*  HCT 38.0 31.8*  MCV 90.3 91.1  PLT 254 225   Cardiac Enzymes: No results for input(s): CKTOTAL, CKMB, CKMBINDEX, TROPONINI in the last 168 hours. BNP: Invalid input(s): POCBNP CBG: Recent Labs  Lab 01/20/17 1159 01/20/17 1605 01/20/17 2027 01/20/17 2317 01/21/17 0332  GLUCAP 188* 137* 263* 145* 126*   D-Dimer No results for input(s): DDIMER in the last 72 hours. Hgb  A1c Recent Labs    01/19/17 1946  HGBA1C 8.0*   Lipid Profile No results for input(s): CHOL, HDL, LDLCALC, TRIG, CHOLHDL, LDLDIRECT in the last 72 hours. Thyroid function studies No results for input(s): TSH, T4TOTAL, T3FREE, THYROIDAB in the last 72 hours.  Invalid input(s): FREET3 Anemia work up No results for input(s): VITAMINB12, FOLATE, FERRITIN, TIBC, IRON, RETICCTPCT in the last 72 hours. Urinalysis    Component Value Date/Time  COLORURINE YELLOW 01/19/2017 [place] 01/19/2017 1458   LABSPEC 1.011 01/19/2017 1458   PHURINE 5.0 01/19/2017 1458   GLUCOSEU NEGATIVE 01/19/2017 1458   HGBUR NEGATIVE 01/19/2017 1458   BILIRUBINUR NEGATIVE 01/19/2017 1458   KETONESUR NEGATIVE 01/19/2017 1458   PROTEINUR NEGATIVE 01/19/2017 1458   NITRITE NEGATIVE 01/19/2017 1458   LEUKOCYTESUR NEGATIVE 01/19/2017 1458   Sepsis Labs Invalid input(s): PROCALCITONIN,  WBC,  LACTICIDVEN Microbiology Recent Results (from the past 240 hour(s))  MRSA PCR Screening     Status: None   Collection Time: 01/19/17  6:58 PM  Result Value Ref Range Status   MRSA by PCR NEGATIVE NEGATIVE Final    Comment:        The GeneXpert MRSA Assay (FDA approved for NASAL specimens only), is one component of a comprehensive MRSA colonization surveillance program. It is not intended to diagnose MRSA infection nor to guide or monitor treatment for MRSA infections.      Time coordinating discharge: Over 30 minutes  SIGNED:   Charlynne Cousins, MD  [facility] 01/21/2017, 7:38 AM Pager   If 7PM-7AM, please contact night-coverage [email or web address] Password [email or web address]

## 2017-01-31 ENCOUNTER — Emergency Department (HOSPITAL_COMMUNITY)

## 2017-01-31 ENCOUNTER — Inpatient Hospital Stay (HOSPITAL_COMMUNITY)
Admission: EM | Admit: 2017-01-31 | Discharge: 2017-02-02 | DRG: 433 | Disposition: A | Discharge status: Hospice | Attending: Family Medicine | Admitting: Family Medicine

## 2017-01-31 ENCOUNTER — Other Ambulatory Visit: Payer: Self-pay

## 2017-01-31 ENCOUNTER — Encounter (HOSPITAL_COMMUNITY): Payer: Self-pay | Admitting: Family Medicine

## 2017-01-31 DIAGNOSIS — I5032 Chronic diastolic (congestive) heart failure: Secondary | ICD-10-CM | POA: Diagnosis present

## 2017-01-31 DIAGNOSIS — Z87891 Personal history of nicotine dependence: Secondary | ICD-10-CM

## 2017-01-31 DIAGNOSIS — E118 Type 2 diabetes mellitus with unspecified complications: Secondary | ICD-10-CM

## 2017-01-31 DIAGNOSIS — I9589 Other hypotension: Secondary | ICD-10-CM | POA: Diagnosis present

## 2017-01-31 DIAGNOSIS — K769 Liver disease, unspecified: Secondary | ICD-10-CM

## 2017-01-31 DIAGNOSIS — J9811 Atelectasis: Secondary | ICD-10-CM | POA: Diagnosis present

## 2017-01-31 DIAGNOSIS — N184 Chronic kidney disease, stage 4 (severe): Secondary | ICD-10-CM | POA: Diagnosis not present

## 2017-01-31 DIAGNOSIS — E785 Hyperlipidemia, unspecified: Secondary | ICD-10-CM | POA: Diagnosis present

## 2017-01-31 DIAGNOSIS — I4892 Unspecified atrial flutter: Secondary | ICD-10-CM | POA: Diagnosis present

## 2017-01-31 DIAGNOSIS — E871 Hypo-osmolality and hyponatremia: Secondary | ICD-10-CM | POA: Diagnosis present

## 2017-01-31 DIAGNOSIS — R109 Unspecified abdominal pain: Secondary | ICD-10-CM | POA: Diagnosis not present

## 2017-01-31 DIAGNOSIS — E1122 Type 2 diabetes mellitus with diabetic chronic kidney disease: Secondary | ICD-10-CM | POA: Diagnosis present

## 2017-01-31 DIAGNOSIS — R0602 Shortness of breath: Secondary | ICD-10-CM | POA: Diagnosis not present

## 2017-01-31 DIAGNOSIS — Z882 Allergy status to sulfonamides status: Secondary | ICD-10-CM

## 2017-01-31 DIAGNOSIS — D649 Anemia, unspecified: Secondary | ICD-10-CM | POA: Diagnosis present

## 2017-01-31 DIAGNOSIS — E119 Type 2 diabetes mellitus without complications: Secondary | ICD-10-CM

## 2017-01-31 DIAGNOSIS — K7290 Hepatic failure, unspecified without coma: Secondary | ICD-10-CM | POA: Diagnosis not present

## 2017-01-31 DIAGNOSIS — I959 Hypotension, unspecified: Secondary | ICD-10-CM | POA: Diagnosis present

## 2017-01-31 DIAGNOSIS — Z66 Do not resuscitate: Secondary | ICD-10-CM | POA: Diagnosis present

## 2017-01-31 DIAGNOSIS — K7460 Unspecified cirrhosis of liver: Secondary | ICD-10-CM | POA: Diagnosis present

## 2017-01-31 DIAGNOSIS — Z90710 Acquired absence of both cervix and uterus: Secondary | ICD-10-CM

## 2017-01-31 DIAGNOSIS — Z515 Encounter for palliative care: Secondary | ICD-10-CM | POA: Diagnosis present

## 2017-01-31 DIAGNOSIS — Z794 Long term (current) use of insulin: Secondary | ICD-10-CM | POA: Diagnosis not present

## 2017-01-31 DIAGNOSIS — Z888 Allergy status to other drugs, medicaments and biological substances status: Secondary | ICD-10-CM

## 2017-01-31 DIAGNOSIS — I4510 Unspecified right bundle-branch block: Secondary | ICD-10-CM | POA: Diagnosis present

## 2017-01-31 DIAGNOSIS — Z91048 Other nonmedicinal substance allergy status: Secondary | ICD-10-CM

## 2017-01-31 DIAGNOSIS — K7469 Other cirrhosis of liver: Secondary | ICD-10-CM | POA: Diagnosis not present

## 2017-01-31 DIAGNOSIS — Z880 Allergy status to penicillin: Secondary | ICD-10-CM

## 2017-01-31 DIAGNOSIS — Z881 Allergy status to other antibiotic agents status: Secondary | ICD-10-CM

## 2017-01-31 DIAGNOSIS — Z79899 Other long term (current) drug therapy: Secondary | ICD-10-CM

## 2017-01-31 DIAGNOSIS — R531 Weakness: Secondary | ICD-10-CM | POA: Diagnosis present

## 2017-01-31 DIAGNOSIS — R188 Other ascites: Secondary | ICD-10-CM | POA: Diagnosis present

## 2017-01-31 LAB — I-STAT TROPONIN, ED: TROPONIN I, POC: 0 ng/mL (ref 0.00–0.08)

## 2017-01-31 LAB — I-STAT CHEM 8, ED
BUN: 54 mg/dL — ABNORMAL HIGH (ref 6–20)
CALCIUM ION: 1.15 mmol/L (ref 1.15–1.40)
CHLORIDE: 99 mmol/L — AB (ref 101–111)
Creatinine, Ser: 2.2 mg/dL — ABNORMAL HIGH (ref 0.44–1.00)
GLUCOSE: 198 mg/dL — AB (ref 65–99)
HCT: 34 % — ABNORMAL LOW (ref 36.0–46.0)
Hemoglobin: 11.6 g/dL — ABNORMAL LOW (ref 12.0–15.0)
Potassium: 4.7 mmol/L (ref 3.5–5.1)
Sodium: 132 mmol/L — ABNORMAL LOW (ref 135–145)
TCO2: 23 mmol/L (ref 22–32)

## 2017-01-31 LAB — CBC WITH DIFFERENTIAL/PLATELET
BASOS ABS: 0 10*3/uL (ref 0.0–0.1)
BASOS PCT: 1 %
EOS ABS: 0.3 10*3/uL (ref 0.0–0.7)
Eosinophils Relative: 6 %
HEMATOCRIT: 33.1 % — AB (ref 36.0–46.0)
HEMOGLOBIN: 11 g/dL — AB (ref 12.0–15.0)
Lymphocytes Relative: 20 %
Lymphs Abs: 1.2 10*3/uL (ref 0.7–4.0)
MCH: 30.1 pg (ref 26.0–34.0)
MCHC: 33.2 g/dL (ref 30.0–36.0)
MCV: 90.7 fL (ref 78.0–100.0)
MONOS PCT: 4 %
Monocytes Absolute: 0.2 10*3/uL (ref 0.1–1.0)
NEUTROS PCT: 69 %
Neutro Abs: 4.3 10*3/uL (ref 1.7–7.7)
Platelets: 210 10*3/uL (ref 150–400)
RBC: 3.65 MIL/uL — AB (ref 3.87–5.11)
RDW: 13.5 % (ref 11.5–15.5)
WBC: 6.1 10*3/uL (ref 4.0–10.5)

## 2017-01-31 LAB — COMPREHENSIVE METABOLIC PANEL
ALK PHOS: 66 U/L (ref 38–126)
ALT: 19 U/L (ref 14–54)
AST: 26 U/L (ref 15–41)
Albumin: 3 g/dL — ABNORMAL LOW (ref 3.5–5.0)
Anion gap: 8 (ref 5–15)
BUN: 63 mg/dL — ABNORMAL HIGH (ref 6–20)
CALCIUM: 8.7 mg/dL — AB (ref 8.9–10.3)
CO2: 23 mmol/L (ref 22–32)
CREATININE: 2.43 mg/dL — AB (ref 0.44–1.00)
Chloride: 100 mmol/L — ABNORMAL LOW (ref 101–111)
GFR, EST AFRICAN AMERICAN: 22 mL/min — AB (ref >60)
GFR, EST NON AFRICAN AMERICAN: 19 mL/min — AB (ref >60)
Glucose, Bld: 205 mg/dL — ABNORMAL HIGH (ref 65–99)
Potassium: 4.8 mmol/L (ref 3.5–5.1)
SODIUM: 131 mmol/L — AB (ref 135–145)
Total Bilirubin: 0.9 mg/dL (ref 0.3–1.2)
Total Protein: 5.9 g/dL — ABNORMAL LOW (ref 6.5–8.1)

## 2017-01-31 LAB — PROTIME-INR
INR: 1.2
PROTHROMBIN TIME: 15.1 s (ref 11.4–15.2)

## 2017-01-31 LAB — GLUCOSE, CAPILLARY: Glucose-Capillary: 132 mg/dL — ABNORMAL HIGH (ref 65–99)

## 2017-01-31 LAB — AMMONIA: AMMONIA: 40 umol/L — AB (ref 9–35)

## 2017-01-31 MED ORDER — ACETAMINOPHEN 650 MG RE SUPP: 650.0000 mg | Freq: Four times a day (QID) | RECTAL | Status: DC | PRN

## 2017-01-31 MED ORDER — ALBUMIN HUMAN 25 % IV SOLN: 50.0000 g | Freq: Once | INTRAVENOUS | Status: DC | PRN

## 2017-01-31 MED ORDER — FUROSEMIDE 80 MG PO TABS
80.0000 mg | ORAL_TABLET | Freq: Two times a day (BID) | ORAL | Status: DC
  Administered 2017-02-01 – 2017-02-02 (×2): 80 mg via ORAL
  Filled 2017-01-31 (×3): qty 1

## 2017-01-31 MED ORDER — HYDROXYZINE HCL 25 MG PO TABS
25.0000 mg | ORAL_TABLET | Freq: Four times a day (QID) | ORAL | Status: DC | PRN
  Administered 2017-02-01 – 2017-02-02 (×3): 25 mg via ORAL
  Filled 2017-01-31 (×3): qty 1

## 2017-01-31 MED ORDER — INSULIN ASPART 100 UNIT/ML ~~LOC~~ SOLN
0.0000 [IU] | SUBCUTANEOUS | Status: DC
Start: 2017-01-31 — End: 2017-02-02
  Administered 2017-01-31: 1 [IU] via SUBCUTANEOUS
  Administered 2017-02-01: 2 [IU] via SUBCUTANEOUS
  Administered 2017-02-01: 1 [IU] via SUBCUTANEOUS
  Administered 2017-02-01 (×2): 2 [IU] via SUBCUTANEOUS
  Administered 2017-02-02: 3 [IU] via SUBCUTANEOUS
  Administered 2017-02-02: 7 [IU] via SUBCUTANEOUS

## 2017-01-31 MED ORDER — MORPHINE SULFATE (CONCENTRATE) 10 MG/0.5ML PO SOLN
10.0000 mg | ORAL | Status: DC | PRN
  Administered 2017-01-31 – 2017-02-01 (×3): 10 mg via ORAL
  Filled 2017-01-31 (×3): qty 0.5

## 2017-01-31 MED ORDER — ONDANSETRON HCL 4 MG PO TABS: 4.0000 mg | ORAL_TABLET | Freq: Four times a day (QID) | ORAL | Status: DC | PRN

## 2017-01-31 MED ORDER — HEPARIN SODIUM (PORCINE) 5000 UNIT/ML IJ SOLN
5000.0000 [IU] | Freq: Three times a day (TID) | INTRAMUSCULAR | Status: DC
  Administered 2017-01-31 – 2017-02-02 (×5): 5000 [IU] via SUBCUTANEOUS
  Filled 2017-01-31 (×5): qty 1

## 2017-01-31 MED ORDER — SODIUM CHLORIDE 0.9 % IV SOLN: 250.0000 mL | INTRAVENOUS | Status: DC | PRN

## 2017-01-31 MED ORDER — SODIUM CHLORIDE 0.9% FLUSH
3.0000 mL | Freq: Two times a day (BID) | INTRAVENOUS | Status: DC
  Administered 2017-01-31 – 2017-02-02 (×4): 3 mL via INTRAVENOUS

## 2017-01-31 MED ORDER — SODIUM CHLORIDE 0.9% FLUSH: 3.0000 mL | INTRAVENOUS | Status: DC | PRN

## 2017-01-31 MED ORDER — AMIODARONE HCL 200 MG PO TABS
100.0000 mg | ORAL_TABLET | Freq: Every day | ORAL | Status: DC
  Administered 2017-02-01 – 2017-02-02 (×2): 100 mg via ORAL
  Filled 2017-01-31 (×2): qty 1

## 2017-01-31 MED ORDER — LORAZEPAM 0.5 MG PO TABS: 0.5000 mg | ORAL_TABLET | Freq: Four times a day (QID) | ORAL | Status: DC | PRN

## 2017-01-31 MED ORDER — SODIUM CHLORIDE 0.9 % IV BOLUS (SEPSIS): 1000.0000 mL | Freq: Once | INTRAVENOUS | Status: DC

## 2017-01-31 MED ORDER — ONDANSETRON HCL 4 MG/2ML IJ SOLN: 4.0000 mg | Freq: Four times a day (QID) | INTRAMUSCULAR | Status: DC | PRN

## 2017-01-31 MED ORDER — SPIRONOLACTONE 25 MG PO TABS
50.0000 mg | ORAL_TABLET | Freq: Every day | ORAL | Status: DC
  Administered 2017-02-02: 50 mg via ORAL
  Filled 2017-01-31 (×2): qty 2

## 2017-01-31 MED ORDER — ACETAMINOPHEN 325 MG PO TABS
650.0000 mg | ORAL_TABLET | Freq: Four times a day (QID) | ORAL | Status: DC | PRN
  Administered 2017-02-02: 650 mg via ORAL
  Filled 2017-01-31: qty 2

## 2017-01-31 NOTE — ED Notes (Signed)
Report given to asia on floor.  Pt is resting in no distress at this time

## 2017-01-31 NOTE — ED Triage Notes (Signed)
Pt arrives from home via [facility] c/o worsening SOB and abd pain, reports feeling "sleepier" than usual. Pt reports renal and liver failure, reports was scheduled for paracentesis but was unable to keep appt d/t snow last week.  Pt reports living on 2L, SOB at this time. O2 sats 92% on 2L,  O2 increased to 4L, now satting at 99%.

## 2017-01-31 NOTE — ED Provider Notes (Signed)
[facility] EMERGENCY DEPARTMENT Provider Note   CSN: [id number] Arrival date & time: 01/31/17  1439     History   Chief Complaint Chief Complaint  Patient presents with  . Shortness of Breath  . Ascites    HPI Natalie Lane is a 71 y.o. female with a history of decompensated hepatic cirrhosis, renal failure, ascites, DM2 who presents today for evaluation of shortness of breath, ascites and not feeling well.  She reports that she is supposed to get a paracentesis every 2 weeks and normally gets 9 liters removed.  Chart review shows that she last had a paracentesis on 11/28 and had 9 liters of fluids removed.  She denies fevers or chills, no N/V/D.  She has not been taking her morphine for pain relief.  She reports that she had to cancel her previous paracentesis appointment due to the snow storm.   HPI  Past Medical History:  Diagnosis Date  . Anemia   . Ascites   . CHD (congenital heart disease)    a. 1979 s/p septal defect repair (? VSD vs ASD), old op notes not available;  b. 04/2016 Echo: EF 60-65%, no rwma, Gr2 DD, mild AS, mild TR, no PFO (prev repaired).  . Chronic diastolic CHF (congestive heart failure) ([place])    04/2016 Echo: EF 60-65%, no rwma, Gr2 DD.  Marland Kitchen Complication of anesthesia   . 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 04/2016 s/p paracentesis.  . Diabetes mellitus, type 2 ([place])   . Dyspnea   . Edema   . Gastric varices    a. s/p banding.  Marland Kitchen GERD (gastroesophageal reflux disease)   . History of blood transfusion   . Hyperlipidemia   . Normal stress echocardiogram 2008  . Pancreatic adenoma   . Paroxysmal atrial flutter ([place])    a. 11/2009 s/p RFCA.  Marland Kitchen PONV (postoperative nausea and vomiting)    with Cholecystectomy  . Tobacco abuse    a. smokes 4 cigarettes/day.    Patient Active Problem List   Diagnosis Date Noted  . Decompensation of cirrhosis of liver ([place]) 01/31/2017  . CKD (chronic kidney disease), stage IV ([place])   . Advance care  planning   . Goals of care, counseling/discussion   . Palliative care by specialist   . Tobacco abuse   . PONV (postoperative nausea and vomiting)   . Paroxysmal atrial flutter ([place])   . Pancreatic adenoma   . History of blood transfusion   . GERD (gastroesophageal reflux disease)   . Gastric varices   . Dyspnea   . Diabetes mellitus, type 2 ([place])   . 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 Complication of anesthesia   . Chronic diastolic CHF (congestive heart failure) ([place])   . Anemia   . SOB (shortness of breath)   . Hyperkalemia 09/02/2016  . Hypotension 09/02/2016  . Ascites of liver 09/02/2016  . Pressure injury of skin 08/24/2016  . ARF (acute renal failure) ([place]) 06/26/2016  . Tobacco use 06/26/2016  . Decompensated liver disease ([place]) 06/26/2016  . Acute kidney injury ([place])   . PAT (paroxysmal atrial tachycardia) ([place]) 05/12/2016  . Ascites 05/10/2016  . Decompensated hepatic cirrhosis ([place]) 05/10/2016  . Esophageal varices ([place]) 01/23/2014  . Cirrhosis of liver with ascites ([place]) 11/16/2013  . Chest pain at rest 09/23/2011  . Heart palpitations 12/03/2010  . Atrial flutter ([place])   . CHD (congenital heart disease)   . Edema   . Hyperlipidemia   .  Poorly controlled type 2 diabetes mellitus with peripheral neuropathy [facility])     Past Surgical History:  Procedure Laterality Date  . ABDOMINAL HYSTERECTOMY    . ASD versus VSD repair  1979   Operative notes not available.  . ATRIAL ABLATION SURGERY  10 /2011  . [place]   x2  . CHOLECYSTECTOMY    . ESOPHAGOGASTRODUODENOSCOPY (EGD) WITH PROPOFOL N/A 11/16/2013   Procedure: ESOPHAGOGASTRODUODENOSCOPY (EGD) WITH PROPOFOL;  Surgeon: Lear Ng, MD;  Location: [facility];  Service: Endoscopy;  Laterality: N/A;  . ESOPHAGOGASTRODUODENOSCOPY (EGD) WITH PROPOFOL N/A 01/23/2014   Procedure: ESOPHAGOGASTRODUODENOSCOPY (EGD) WITH PROPOFOL;  Surgeon: Lear Ng, MD;  Location: [facility];  Service:  Endoscopy;  Laterality: N/A;  . GASTRIC VARICES BANDING N/A 11/16/2013   Procedure: GASTRIC VARICES BANDING;  Surgeon: Lear Ng, MD;  Location: [facility];  Service: Endoscopy;  Laterality: N/A;  . GASTRIC VARICES BANDING N/A 01/23/2014   Procedure: GASTRIC VARICES BANDING;  Surgeon: Lear Ng, MD;  Location: [facility];  Service: Endoscopy;  Laterality: N/A;  . HERNIA REPAIR    . IR GENERIC HISTORICAL  05/11/2016   IR PARACENTESIS 05/11/2016 Ascencion Dike, PA-C [facility]-INTERV RAD  . IR PARACENTESIS  07/08/2016  . IR PARACENTESIS  08/09/2016  . IR PARACENTESIS  08/24/2016  . IR PARACENTESIS  09/03/2016  . IR PARACENTESIS  09/16/2016  . IR PARACENTESIS  09/23/2016  . IR PARACENTESIS  10/07/2016  . IR PARACENTESIS  10/14/2016  . IR PARACENTESIS  10/27/2016  . IR PARACENTESIS  11/09/2016  . IR PARACENTESIS  11/30/2016  . IR PARACENTESIS  12/06/2016  . IR PARACENTESIS  12/13/2016  . IR PARACENTESIS  12/23/2016  . IR PARACENTESIS  12/30/2016  . IR PARACENTESIS  01/12/2017  . OTHER SURGICAL HISTORY     partial hysterectomy  . PARACENTESIS  09/03/2016    OB History    No data available       Home Medications    Prior to Admission medications   Medication Sig Start Date End Date Taking? Authorizing Provider  amiodarone (PACERONE) 200 MG tablet Take 100 mg by mouth daily.   Yes [provider]  furosemide (LASIX) 80 MG tablet Take 1 tablet (80 mg total) by mouth 2 (two) times daily. 09/05/16  Yes Velvet Bathe, MD  glimepiride (AMARYL) 2 MG tablet Take 2 mg by mouth 2 (two) times daily before a meal.   Yes [provider]  HUMULIN N 100 UNIT/ML injection Inject 15-25 Units into the skin See admin instructions. 25 units in the morning before breakfast and 15 units at bedtime 12/15/16  Yes [provider]  hydrOXYzine (ATARAX/VISTARIL) 25 MG tablet Take 25 mg by mouth every 6 (six) hours as needed for itching.   Yes [provider]  LORazepam  (ATIVAN) 0.5 MG tablet Take 1 tablet (0.5 mg total) by mouth every 6 (six) hours as needed for anxiety. 01/21/17  Yes Charlynne Cousins, MD  Morphine Sulfate (MORPHINE CONCENTRATE) 10 mg / 0.5 ml concentrated solution Take 0.5 mLs (10 mg total) by mouth every 2 (two) hours as needed for severe pain or shortness of breath. 01/21/17  Yes Charlynne Cousins, MD  ondansetron (ZOFRAN-ODT) 4 MG disintegrating tablet Take 4 mg by mouth every 8 (eight) hours as needed for nausea/vomiting. DISSOLVE IN THE MOUTH 12/07/16  Yes [provider]  OXYGEN Inhale 4 L into the lungs continuous.   Yes [provider]  prochlorperazine (COMPAZINE) 10 MG  tablet Take 10 mg by mouth every 6 (six) hours as needed for nausea or vomiting.   Yes [provider]  spironolactone (ALDACTONE) 100 MG tablet Take 50 mg by mouth daily.   Yes [provider]    Family History Family History  Problem Relation Age of Onset  . Hypertension Mother   . Kidney disease Sister   . Depression Paternal Grandfather   . Diabetes Brother     Social History Social History   Tobacco Use  . Smoking status: Former Smoker    Packs/day: 0.50    Years: 36.00    Pack years: 18.00    Types: Cigarettes    Last attempt to quit: 02/16/2016    Years since quitting: 0.9  . Smokeless tobacco: Never Used  . Tobacco comment: was smoking 4 cigs/day prior to admission.  Substance Use Topics  . Alcohol use: No  . Drug use: No     Allergies   Monosodium glutamate; Red dye; Erythromycin; Penicillins; Statins; Sulfa antibiotics; Glipizide; Iohexol; and Tape   Review of Systems Review of Systems  Constitutional: Negative for chills and fever.  HENT: Negative for ear pain and sore throat.   Eyes: Negative for pain and visual disturbance.  Respiratory: Positive for shortness of breath. Negative for cough and chest tightness.   Cardiovascular: Negative for chest pain and palpitations.  Gastrointestinal:  Positive for abdominal distention and abdominal pain. Negative for diarrhea, nausea and vomiting.  Genitourinary: Negative for dysuria and hematuria.  Musculoskeletal: Negative for arthralgias and back pain.  Skin: Negative for color change and rash.  Neurological: Negative for seizures and syncope.  All other systems reviewed and are negative.    Physical Exam Updated Vital Signs BP (!) 81/69 (BP Location: Right Arm)   Pulse 91   Temp 97.9 F (36.6 C) (Oral)   Resp 16   Ht 5\' 3"  (1.6 m)   Wt 72.6 kg (160 lb)   SpO2 100%   BMI 28.34 kg/m   Physical Exam  Constitutional: She is oriented to person, place, and time. She appears well-developed and well-nourished. No distress.  HENT:  Head: Normocephalic and atraumatic.  Eyes: Conjunctivae are normal.  Neck: Neck supple.  Cardiovascular: Normal rate and regular rhythm.  No murmur heard. Pulmonary/Chest: Breath sounds normal. Accessory muscle usage present. No respiratory distress.  Abdominal: Soft. Bowel sounds are normal. She exhibits distension and ascites. There is no tenderness.  Abdomen is markedly distended.    Musculoskeletal:       Right lower leg: She exhibits edema. She exhibits no tenderness.       Left lower leg: She exhibits edema. She exhibits no tenderness.  Neurological: She is alert and oriented to person, place, and time.  Skin: Skin is warm and dry.  Psychiatric: She has a normal mood and affect. Her behavior is normal.  Nursing note and vitals reviewed.    ED Treatments / Results  Labs (all labs ordered are listed, but only abnormal results are displayed) Labs Reviewed  COMPREHENSIVE METABOLIC PANEL - Abnormal; Notable for the following components:      Result Value   Sodium 131 (*)    Chloride 100 (*)    Glucose, Bld 205 (*)    BUN 63 (*)    Creatinine, Ser 2.43 (*)    Calcium 8.7 (*)    Total Protein 5.9 (*)    Albumin 3.0 (*)    GFR calc non Af Amer 19 (*)    GFR  calc Af Amer 22 (*)    All  other components within normal limits  CBC WITH DIFFERENTIAL/PLATELET - Abnormal; Notable for the following components:   RBC 3.65 (*)    Hemoglobin 11.0 (*)    HCT 33.1 (*)    All other components within normal limits  AMMONIA - Abnormal; Notable for the following components:   Ammonia 40 (*)    All other components within normal limits  I-STAT CHEM 8, ED - Abnormal; Notable for the following components:   Sodium 132 (*)    Chloride 99 (*)    BUN 54 (*)    Creatinine, Ser 2.20 (*)    Glucose, Bld 198 (*)    Hemoglobin 11.6 (*)    HCT 34.0 (*)    All other components within normal limits  PROTIME-INR  APTT  I-STAT TROPONIN, ED    EKG  EKG Interpretation  Date/Time:  Monday January 31 2017 15:11:52 EST Ventricular Rate:  88 PR Interval:    QRS Duration: 130 QT Interval:  410 QTC Calculation: 497 R Axis:   47 Text Interpretation:  Accelerated junctional rhythm Right bundle branch block No significant change since last tracing Confirmed by Blanchie Dessert ([id number]) on 01/31/2017 3:33:22 PM       Radiology Dg Chest Port 1 View  Result Date: 01/31/2017 CLINICAL DATA:  Worsening shortness of breath. EXAM: PORTABLE CHEST 1 VIEW COMPARISON:  01/19/2017 FINDINGS: Low volume chest with bandlike opacities at both bases. No edema, effusion, or pneumothorax. Normal heart size and mediastinal contours. Previous median sternotomy IMPRESSION: Low volume chest with bilateral atelectasis. Electronically Signed   By: Monte Fantasia M.D.   On: 01/31/2017 15:25    Procedures Procedures (including critical care time)  Medications Ordered in ED Medications                                         Initial Impression / Assessment and Plan / ED Course  I have reviewed the triage vital signs and the nursing notes.  Pertinent labs & imaging results that were available during my care of the patient were reviewed by me and considered in my medical decision making (see  chart for details).  Clinical Course as of Jan 31 2021  Mon Jan 31, 2017  1557 Spoke with IR who requested I place an order for IR eval and treat and they would get to her today.   [EH]  1853 Paged IR  [EH]  1902 Spoke with IR Doctor McCoullogh who reports he was not told about patient and that it will not get done tonight.    [EH]  1919 Spoke with Dr. Myna Hidalgo who will come see patient  [EH]  1927 Spoke with Dr. Myna Hidalgo and patient's son.  Son reports that last time she was admitted they sent her home with morphine as they were unable to safely drain her ascities   [EH]    Clinical Course User Index [EH] Lorin Glass, PA-C    [name] presents today for evaluation of shortness of breath and increasing ascites with increasing oxygen requirement.  She is being cared for by [facility] with comfort care and is a DNR.  She initially reported that she is multiple weeks over due for drainage of her ascites.  IR was consulted who initially reported that they would get to her today, however later reported they  would be unable to accommodate her today.  Other causes for her increased work of breathing were considered, however based on extent of ascites I suspect that is the cause of her symptoms.  [facility] was consulted for admission.  Son arrived and provided information that IR was unable to perform paracentesis due to concern for death or complications from procedure.  Patient will be admitted for comfort care and IR evaluation in the morning.  The patient was seen by Dr. Maryan Rued who evaluated the patient and agreed with my plan.    At this time there does not appear to be any evidence of an acute emergency medical condition and the patient appears stable for discharge with appropriate outpatient follow up.Diagnosis was discussed with patient who verbalizes understanding and is agreeable to discharge.    Final Clinical Impressions(s) / ED Diagnoses   Final diagnoses:  Decompensation  of cirrhosis of liver (HCC)  Ascites of liver  SOB (shortness of breath)    ED Discharge Orders    None       Ollen Gross 01/31/17 2029    Blanchie Dessert, MD 01/31/17 2209

## 2017-01-31 NOTE — ED Notes (Signed)
Paged IR/Natalie Lane

## 2017-01-31 NOTE — H&P (Signed)
History and Physical    Natalie Lane ZOX:[id number] DOB: 1945/10/17 DOA: 01/31/2017  PCP: Carol Ada, MD   Patient coming from: Home  Chief Complaint: Abd pain, SOB   HPI: Natalie Lane is a pleasant, but unfortunate 71 y.o. female with medical history significant for liver cirrhosis with ascites, chronic kidney disease stage IV, type 2 diabetes mellitus, paroxysmal atrial flutter, and chronic diastolic CHF, now presenting to the emergency department for evaluation of abdominal pain and shortness of breath.  Has advanced liver and kidney disease, had been undergoing regular large volume paracenteses, was unable to undergo the procedure recently due to her chronic hypotension, and recently transitioned to hospice.  She has not been taking her morphine at home despite increasing abdominal pain.  Reports continued worsening in her ascites, now causing pain and shortness of breath.  Denies fevers or chills and denies chest pain.  ED Course: Upon arrival to the ED, patient is found to be afebrile, saturating adequately on her usual 2 L/min of supplemental oxygen, blood pressure 90/45, and vitals otherwise stable.  EKG features and accelerated junctional rhythm with RBBB.  Chest x-ray is notable for bilateral atelectasis.  Chemistry panel features a sodium of 131 and creatinine of 2.45, consistent with her baseline.  Ammonia is slightly elevated, troponin is undetectable, and CBC features a mild ascitic anemia.  IR was consulted by the ED physician for paracentesis, agrees to evaluate the patient, and requests a medical admission evaluation tomorrow.  Patient will be observed on the medical-surgical unit for IR consultation and possible therapeutic paracentesis.  Review of Systems:  All other systems reviewed and apart from HPI, are negative.  Past Medical History:  Diagnosis Date  . Anemia   . Ascites   . CHD (congenital heart disease)    a. 1979 s/p septal defect repair (? VSD vs ASD),  old op notes not available;  b. 04/2016 Echo: EF 60-65%, no rwma, Gr2 DD, mild AS, mild TR, no PFO (prev repaired).  . Chronic diastolic CHF (congestive heart failure) ([place])    04/2016 Echo: EF 60-65%, no rwma, Gr2 DD.  Marland Kitchen Complication of anesthesia   . 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 04/2016 s/p paracentesis.  . Diabetes mellitus, type 2 ([place])   . Dyspnea   . Edema   . Gastric varices    a. s/p banding.  Marland Kitchen GERD (gastroesophageal reflux disease)   . History of blood transfusion   . Hyperlipidemia   . Normal stress echocardiogram 2008  . Pancreatic adenoma   . Paroxysmal atrial flutter ([place])    a. 11/2009 s/p RFCA.  Marland Kitchen PONV (postoperative nausea and vomiting)    with Cholecystectomy  . Tobacco abuse    a. smokes 4 cigarettes/day.    Past Surgical History:  Procedure Laterality Date  . ABDOMINAL HYSTERECTOMY    . ASD versus VSD repair  1979   Operative notes not available.  . ATRIAL ABLATION SURGERY  10 /2011  . [place]   x2  . CHOLECYSTECTOMY    . ESOPHAGOGASTRODUODENOSCOPY (EGD) WITH PROPOFOL N/A 11/16/2013   Procedure: ESOPHAGOGASTRODUODENOSCOPY (EGD) WITH PROPOFOL;  Surgeon: Lear Ng, MD;  Location: [facility];  Service: Endoscopy;  Laterality: N/A;  . ESOPHAGOGASTRODUODENOSCOPY (EGD) WITH PROPOFOL N/A 01/23/2014   Procedure: ESOPHAGOGASTRODUODENOSCOPY (EGD) WITH PROPOFOL;  Surgeon: Lear Ng, MD;  Location: [facility];  Service: Endoscopy;  Laterality: N/A;  . GASTRIC VARICES BANDING N/A 11/16/2013   Procedure: GASTRIC VARICES BANDING;  Surgeon: Lear Ng, MD;  Location: [facility] ENDOSCOPY;  Service: Endoscopy;  Laterality: N/A;  . GASTRIC VARICES BANDING N/A 01/23/2014   Procedure: GASTRIC VARICES BANDING;  Surgeon: Lear Ng, MD;  Location: [facility];  Service: Endoscopy;  Laterality: N/A;  . HERNIA REPAIR    . IR GENERIC HISTORICAL  05/11/2016   IR PARACENTESIS 05/11/2016 Ascencion Dike, PA-C [facility]-INTERV RAD  . IR PARACENTESIS   07/08/2016  . IR PARACENTESIS  08/09/2016  . IR PARACENTESIS  08/24/2016  . IR PARACENTESIS  09/03/2016  . IR PARACENTESIS  09/16/2016  . IR PARACENTESIS  09/23/2016  . IR PARACENTESIS  10/07/2016  . IR PARACENTESIS  10/14/2016  . IR PARACENTESIS  10/27/2016  . IR PARACENTESIS  11/09/2016  . IR PARACENTESIS  11/30/2016  . IR PARACENTESIS  12/06/2016  . IR PARACENTESIS  12/13/2016  . IR PARACENTESIS  12/23/2016  . IR PARACENTESIS  12/30/2016  . IR PARACENTESIS  01/12/2017  . OTHER SURGICAL HISTORY     partial hysterectomy  . PARACENTESIS  09/03/2016     reports that she quit smoking about a year ago. Her smoking use included cigarettes. She has a 18.00 pack-year smoking history. she has never used smokeless tobacco. She reports that she does not drink alcohol or use drugs.  Allergies  Allergen Reactions  . Monosodium Glutamate Anaphylaxis    MSG  . Red Dye Anaphylaxis  . Erythromycin Other (See Comments)    Reaction:  Migraine   . Penicillins Other (See Comments)    Reaction:  Migraine  Has patient had a PCN reaction causing immediate rash, facial/tongue/throat swelling, SOB or lightheadedness with hypotension: No Has patient had a PCN reaction causing severe rash involving mucus membranes or skin necrosis: No Has patient had a PCN reaction that required hospitalization No Has patient had a PCN reaction occurring within the last 10 years: No If all of the above answers are "NO", then may proceed with Cephalosporin use.  . Statins Other (See Comments)    Reaction:  Severe leg cramps   . Sulfa Antibiotics Hives  . Glipizide Rash  . Iohexol Rash  . Tape Rash    MEDICAL "PLASTIC" TAPE CAUSES BLISTERS!!! PLEASE USE AN ALTERNATIVE    Family History  Problem Relation Age of Onset  . Hypertension Mother   . Kidney disease Sister   . Depression Paternal Grandfather   . Diabetes Brother      Prior to Admission medications   Medication Sig Start Date End Date Taking? Authorizing  Provider  amiodarone (PACERONE) 200 MG tablet Take 100 mg by mouth daily.   Yes [provider]  furosemide (LASIX) 80 MG tablet Take 1 tablet (80 mg total) by mouth 2 (two) times daily. 09/05/16  Yes Velvet Bathe, MD  glimepiride (AMARYL) 2 MG tablet Take 2 mg by mouth 2 (two) times daily before a meal.   Yes [provider]  HUMULIN N 100 UNIT/ML injection Inject 15-25 Units into the skin See admin instructions. 25 units in the morning before breakfast and 15 units at bedtime 12/15/16  Yes [provider]  hydrOXYzine (ATARAX/VISTARIL) 25 MG tablet Take 25 mg by mouth every 6 (six) hours as needed for itching.   Yes [provider]  LORazepam (ATIVAN) 0.5 MG tablet Take 1 tablet (0.5 mg total) by mouth every 6 (six) hours as needed for anxiety. 01/21/17  Yes Charlynne Cousins, MD  Morphine Sulfate (MORPHINE CONCENTRATE) 10 mg / 0.5 ml concentrated solution Take  0.5 mLs (10 mg total) by mouth every 2 (two) hours as needed for severe pain or shortness of breath. 01/21/17  Yes Charlynne Cousins, MD  ondansetron (ZOFRAN-ODT) 4 MG disintegrating tablet Take 4 mg by mouth every 8 (eight) hours as needed for nausea/vomiting. DISSOLVE IN THE MOUTH 12/07/16  Yes [provider]  OXYGEN Inhale 4 L into the lungs continuous.   Yes [provider]  prochlorperazine (COMPAZINE) 10 MG tablet Take 10 mg by mouth every 6 (six) hours as needed for nausea or vomiting.   Yes [provider]  spironolactone (ALDACTONE) 100 MG tablet Take 50 mg by mouth daily.   Yes [provider]    Physical Exam: Vitals:   01/31/17 1725 01/31/17 1730 01/31/17 1800 01/31/17 1934  BP: (!) 86/51 90/66 (!) 92/46 (!) 81/69  Pulse: 89 88 86 91  Resp: 18   16  Temp:      TempSrc:      SpO2: 100% 100% 100% 100%  Weight:      Height:          Constitutional: NAD, calm, appears uncomfortable Eyes: PERTLA, lids and conjunctivae normal ENMT: Mucous  membranes are moist. Posterior pharynx clear of any exudate or lesions.   Neck: normal, supple, no masses, no thyromegaly Respiratory: clear to auscultation bilaterally, no wheezing, no crackles. Normal respiratory effort.   Cardiovascular: S1 & S2 heard, regular rate and rhythm. Bilateral pretibial edema. Abdomen: Distended, mild generalized tenderness without rebound pain or guarding. Bowel sounds active.  Musculoskeletal: no clubbing / cyanosis. No joint deformity upper and lower extremities.  Skin: no significant rashes, lesions, ulcers. Warm, dry, well-perfused. Neurologic: CN 2-12 grossly intact. Sensation intact. Strength 5/5 in all 4 limbs.  Psychiatric: Alert and oriented x 3. Pleasant, cooperative.     Labs on Admission: I have personally reviewed following labs and imaging studies  CBC: Recent Labs  Lab 01/31/17 1553 01/31/17 1633  WBC 6.1  --   NEUTROABS 4.3  --   HGB 11.0* 11.6*  HCT 33.1* 34.0*  MCV 90.7  --   PLT 210  --    Basic Metabolic Panel: Recent Labs  Lab 01/31/17 1553 01/31/17 1633  NA 131* 132*  K 4.8 4.7  CL 100* 99*  CO2 23  --   GLUCOSE 205* 198*  BUN 63* 54*  CREATININE 2.43* 2.20*  CALCIUM 8.7*  --    GFR: Estimated Creatinine Clearance: 22.7 mL/min (A) (by C-G formula based on SCr of 2.2 mg/dL (H)). Liver Function Tests: Recent Labs  Lab 01/31/17 1553  AST 26  ALT 19  ALKPHOS 66  BILITOT 0.9  PROT 5.9*  ALBUMIN 3.0*   No results for input(s): LIPASE, AMYLASE in the last 168 hours. Recent Labs  Lab 01/31/17 1553  AMMONIA 40*   Coagulation Profile: Recent Labs  Lab 01/31/17 1553  INR 1.20   Cardiac Enzymes: No results for input(s): CKTOTAL, CKMB, CKMBINDEX, TROPONINI in the last 168 hours. BNP (last 3 results) No results for input(s): PROBNP in the last 8760 hours. HbA1C: No results for input(s): HGBA1C in the last 72 hours. CBG: No results for input(s): GLUCAP in the last 168 hours. Lipid Profile: No results for  input(s): CHOL, HDL, LDLCALC, TRIG, CHOLHDL, LDLDIRECT in the last 72 hours. Thyroid Function Tests: No results for input(s): TSH, T4TOTAL, FREET4, T3FREE, THYROIDAB in the last 72 hours. Anemia Panel: No results for input(s): VITAMINB12, FOLATE, FERRITIN, TIBC, IRON, RETICCTPCT in the last 72 hours.  Urine analysis:    Component Value Date/Time   COLORURINE YELLOW 01/19/2017 [place] 01/19/2017 1458   LABSPEC 1.011 01/19/2017 1458   PHURINE 5.0 01/19/2017 1458   GLUCOSEU NEGATIVE 01/19/2017 1458   HGBUR NEGATIVE 01/19/2017 1458   BILIRUBINUR NEGATIVE 01/19/2017 1458   KETONESUR NEGATIVE 01/19/2017 1458   PROTEINUR NEGATIVE 01/19/2017 1458   NITRITE NEGATIVE 01/19/2017 1458   LEUKOCYTESUR NEGATIVE 01/19/2017 1458   Sepsis Labs: @LABRCNTIP (procalcitonin:4,lacticidven:4) )No results found for this or any previous visit (from the past 240 hour(s)).   Radiological Exams on Admission: Dg Chest Port 1 View  Result Date: 01/31/2017 CLINICAL DATA:  Worsening shortness of breath. EXAM: PORTABLE CHEST 1 VIEW COMPARISON:  01/19/2017 FINDINGS: Low volume chest with bandlike opacities at both bases. No edema, effusion, or pneumothorax. Normal heart size and mediastinal contours. Previous median sternotomy IMPRESSION: Low volume chest with bilateral atelectasis. Electronically Signed   By: Monte Fantasia M.D.   On: 01/31/2017 15:25    EKG: Independently reviewed. Accelerated junctional rhythm.   Assessment/Plan  1. Decompensated liver cirrhosis  - Pt has decompensated cirrhosis, presenting with abd pain and SOB secondary to ascites  - She had been undergoing regular large-volume paracentesis but has recently been unable to tolerate d/t hypotension  - She is a hospice patient, now requested therapeutic paracentesis for comfort, understanding that she may not survive the procedure  - IR was consulted by ED physician, agreed to evaluate patient for paracentesis  - Continue prn  analgesia, diuretics as BP allows, albumin with large-vol paracentesis    2. Hypotension  - SBP in 80-90 range in ED  - This is chronic and related to #1  3. Type II DM  - A1c was 8.2% earlier this month  - Managed at home with glimepiride and Humulin N 15 units qAM and 25 units qPM - Check CBG's and use a SSI with Novolog    4. CKD stage IV  - SCr is stable on admission  - Plan for albumin with large-volume paracentesis   DVT prophylaxis: sq heparin   Code Status: DNR  Family Communication: Son updated at bedside Disposition Plan: Observe on med-surg Consults called: Interventional radiology Admission status: Observation    Vianne Bulls, MD [facility] Pager [phone]  If 7PM-7AM, please contact night-coverage [email or web address] Password [facility]  01/31/2017, 7:36 PM

## 2017-01-31 NOTE — ED Notes (Signed)
[place], [phone], to inform them that patient was in the ED.  Noone answered the phone.

## 2017-01-31 NOTE — ED Notes (Signed)
Pt was given coffee and cream and tolerated this well.  Now transported up to 5W. SHe reports that she feels much better after getting morphine

## 2017-02-01 ENCOUNTER — Encounter (HOSPITAL_COMMUNITY): Payer: Self-pay | Admitting: General Surgery

## 2017-02-01 ENCOUNTER — Observation Stay (HOSPITAL_COMMUNITY)

## 2017-02-01 DIAGNOSIS — E119 Type 2 diabetes mellitus without complications: Secondary | ICD-10-CM | POA: Diagnosis not present

## 2017-02-01 DIAGNOSIS — Z882 Allergy status to sulfonamides status: Secondary | ICD-10-CM | POA: Diagnosis not present

## 2017-02-01 DIAGNOSIS — Z888 Allergy status to other drugs, medicaments and biological substances status: Secondary | ICD-10-CM | POA: Diagnosis not present

## 2017-02-01 DIAGNOSIS — K7290 Hepatic failure, unspecified without coma: Secondary | ICD-10-CM | POA: Diagnosis present

## 2017-02-01 DIAGNOSIS — I4891 Unspecified atrial fibrillation: Secondary | ICD-10-CM | POA: Diagnosis not present

## 2017-02-01 DIAGNOSIS — R188 Other ascites: Secondary | ICD-10-CM | POA: Diagnosis not present

## 2017-02-01 DIAGNOSIS — Z91048 Other nonmedicinal substance allergy status: Secondary | ICD-10-CM | POA: Diagnosis not present

## 2017-02-01 DIAGNOSIS — I10 Essential (primary) hypertension: Secondary | ICD-10-CM | POA: Diagnosis not present

## 2017-02-01 DIAGNOSIS — Z66 Do not resuscitate: Secondary | ICD-10-CM | POA: Diagnosis present

## 2017-02-01 DIAGNOSIS — E118 Type 2 diabetes mellitus with unspecified complications: Secondary | ICD-10-CM | POA: Diagnosis not present

## 2017-02-01 DIAGNOSIS — Z515 Encounter for palliative care: Secondary | ICD-10-CM | POA: Diagnosis present

## 2017-02-01 DIAGNOSIS — I959 Hypotension, unspecified: Secondary | ICD-10-CM | POA: Diagnosis present

## 2017-02-01 DIAGNOSIS — K7291 Hepatic failure, unspecified with coma: Secondary | ICD-10-CM | POA: Diagnosis not present

## 2017-02-01 DIAGNOSIS — I5032 Chronic diastolic (congestive) heart failure: Secondary | ICD-10-CM | POA: Diagnosis present

## 2017-02-01 DIAGNOSIS — I4892 Unspecified atrial flutter: Secondary | ICD-10-CM | POA: Diagnosis present

## 2017-02-01 DIAGNOSIS — K7469 Other cirrhosis of liver: Secondary | ICD-10-CM | POA: Diagnosis not present

## 2017-02-01 DIAGNOSIS — E871 Hypo-osmolality and hyponatremia: Secondary | ICD-10-CM | POA: Diagnosis present

## 2017-02-01 DIAGNOSIS — R531 Weakness: Secondary | ICD-10-CM | POA: Diagnosis present

## 2017-02-01 DIAGNOSIS — N184 Chronic kidney disease, stage 4 (severe): Secondary | ICD-10-CM | POA: Diagnosis present

## 2017-02-01 DIAGNOSIS — Z880 Allergy status to penicillin: Secondary | ICD-10-CM | POA: Diagnosis not present

## 2017-02-01 DIAGNOSIS — R109 Unspecified abdominal pain: Secondary | ICD-10-CM | POA: Diagnosis present

## 2017-02-01 DIAGNOSIS — Z79899 Other long term (current) drug therapy: Secondary | ICD-10-CM | POA: Diagnosis not present

## 2017-02-01 DIAGNOSIS — J9811 Atelectasis: Secondary | ICD-10-CM | POA: Diagnosis present

## 2017-02-01 DIAGNOSIS — I4510 Unspecified right bundle-branch block: Secondary | ICD-10-CM | POA: Diagnosis present

## 2017-02-01 DIAGNOSIS — Z87891 Personal history of nicotine dependence: Secondary | ICD-10-CM | POA: Diagnosis not present

## 2017-02-01 DIAGNOSIS — Z881 Allergy status to other antibiotic agents status: Secondary | ICD-10-CM | POA: Diagnosis not present

## 2017-02-01 DIAGNOSIS — R0602 Shortness of breath: Secondary | ICD-10-CM | POA: Diagnosis present

## 2017-02-01 DIAGNOSIS — I9589 Other hypotension: Secondary | ICD-10-CM | POA: Diagnosis not present

## 2017-02-01 DIAGNOSIS — K7460 Unspecified cirrhosis of liver: Secondary | ICD-10-CM | POA: Diagnosis not present

## 2017-02-01 DIAGNOSIS — Z794 Long term (current) use of insulin: Secondary | ICD-10-CM | POA: Diagnosis not present

## 2017-02-01 DIAGNOSIS — E1122 Type 2 diabetes mellitus with diabetic chronic kidney disease: Secondary | ICD-10-CM | POA: Diagnosis present

## 2017-02-01 HISTORY — PX: IR PARACENTESIS: IMG2679

## 2017-02-01 LAB — GLUCOSE, CAPILLARY
GLUCOSE-CAPILLARY: 70 mg/dL (ref 65–99)
GLUCOSE-CAPILLARY: 75 mg/dL (ref 65–99)
Glucose-Capillary: 140 mg/dL — ABNORMAL HIGH (ref 65–99)
Glucose-Capillary: 151 mg/dL — ABNORMAL HIGH (ref 65–99)
Glucose-Capillary: 167 mg/dL — ABNORMAL HIGH (ref 65–99)
Glucose-Capillary: 177 mg/dL — ABNORMAL HIGH (ref 65–99)

## 2017-02-01 MED ORDER — ALBUMIN HUMAN 25 % IV SOLN
50.0000 g | Freq: Once | INTRAVENOUS | Status: AC | PRN
  Administered 2017-02-01: 50 g via INTRAVENOUS
  Filled 2017-02-01: qty 200

## 2017-02-01 MED ORDER — LIDOCAINE HCL (PF) 2 % IJ SOLN
INTRAMUSCULAR | Status: DC | PRN
  Administered 2017-02-01: 10 mL

## 2017-02-01 MED ORDER — ORAL CARE MOUTH RINSE
15.0000 mL | Freq: Two times a day (BID) | OROMUCOSAL | Status: DC
  Administered 2017-02-01 – 2017-02-02 (×3): 15 mL via OROMUCOSAL

## 2017-02-01 MED ORDER — LIDOCAINE HCL (PF) 2 % IJ SOLN
INTRAMUSCULAR | Status: AC
  Filled 2017-02-01: qty 10

## 2017-02-01 NOTE — Procedures (Signed)
Ultrasound-guided therapeutic paracentesis performed yielding 4.9 liters of chylous colored fluid. No immediate complications.  Natalie Lane 1:55 PM 02/01/2017

## 2017-02-01 NOTE — Care Management Note (Addendum)
Case Management Note  Patient Details  Name: Natalie Lane MRN: [id number] Date of Birth: 1945-10-10  Subjective/Objective:       Decompensated liver  Disease.             Action/Plan: Plan is to d/c to home today with resumption of home hospice. [facility] to provide transportation to home   Expected Discharge Date:    02/02/2017            Expected Discharge Plan:  Home w Hospice Care  In-House Referral:     Discharge planning Services  CM Consult  Post Acute Care Choice:    Choice offered to:      Status of Service:  Completed, signed off  If discussed at Long Length of Stay Meetings, dates discussed:    Additional Comments:  Sharin Mons, RN 02/01/2017, 3:15 PM

## 2017-02-01 NOTE — Progress Notes (Signed)
MD notified  

## 2017-02-01 NOTE — Progress Notes (Addendum)
[facility] 5W-09 [facility] [place] G[facility] RN Visit at 9:30 AM  This is a related and covered GIP admission of 01/31/17 with [facility] diagnosis of cirrhosis, per Dr. Tomasa Hosteller.  Patient has an [place] DNR in the home.  Patient activated EMS after worsening shortness of breath, abdominal pain and lethargy.  Patient was admitted for treatment of decompensated liver cirrhosis.  Patient seen in room, with RN at bedside.  Patient alert and oriented.  Patient short of breath in conversation. She is on 4 L [place] with O2 sats and respirations WNL.  Patient verbalized abdominal discomfort d/t the fluid.  Per RN, patient is scheduled for abdominal paracentesis today and is currently NPO.  Patient reported itching "all over" she believes to be from the pain medication.  RN administered 25 mg Hydroxyzine PO for c/o itching.  Patient has received 2 doses of 10 mg Roxanol for c/o pain in the past 24 hours.  Patient stated she desires to return home with the support of hospice, once the paracentesis is completed and her symptoms are better controlled.  [facility] will follow daily and assist in discharge planning.  Updated [facility] medication list and transfer summary placed on chart.  Please contact with any hospice-related questions or concerns.  Thank you, Freddi Starr RN, BSN [facility] Liaison [phone]  All [facility] hospital liaisons now live on [place].  Addendum @ 2:45 PM:  Spoke to patient and significant other, Bill re: goals of care and EOL wishes.  Discussed the possibility of [facility] vs going home with the support of hospice at time of discharge.  Patient very clear that she would like to pass in her home with her pets.  Patient became tearful and verbalized understanding of her grave condition and limited time.  Offered prayer of comfort per patient request.  [facility] chaplin notified to make visit later this week in her home.  Patient will require [facility] for transportation home and [facility], [facility]  notified.  Patient denies any additional equipment or supply needs.  [facility] RN will visit patient in the home tomorrow.

## 2017-02-01 NOTE — Progress Notes (Signed)
Home Care SW made visit to Patient, Significant Other was at her bedside.  She stated they got almost 5 liters of fluid off and she talked about getting to go home today. No immediate concerns noted and SW will follow up after Patient returns home.  Natalie Lane, [facility] [place]

## 2017-02-01 NOTE — Progress Notes (Signed)
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   Assessment & Plan:  Principal Problem:   Decompensated liver disease ([place]) Active Problems:   Cirrhosis of liver with ascites (HCC)   Hypotension   Diabetes mellitus, type 2 (HCC)   Decompensation of cirrhosis of liver (HCC)   Ascites 4.9L fluid taken off today.  Hypotension, treated with albumin.  Feels better after. -Home with Hospice when able to stand   The patient has had refractory ascites now for several months, and has been enrolled in Hospice, I understand since around October.  From chart review, I can see that her primary GI was ordering outpatient paracentesis for her *at least* every week, until about 2.5 weeks ago, after her last admission, because her prognosis had gotten so much worse (so much so, in fact that she has been offered [employer] by the [facility] team).    Code Status: DNR Family Communication: Partner at bedside Disposition Plan: Monitor overnight, if able to ambulate tomorrow, home with Hospice following.   Subjective: Weak, listless.  Before the paracentesis, much abdominal pain, dyspnea.  After th paracentesis, this was resolved, but she was extremely weak, inattentive, nearly obtunded.  Objective: Vitals:   02/01/17 1249 02/01/17 1344 02/01/17 1358 02/01/17 2017  BP: (!) 74/40 (!) 89/52 (!) 90/54 (!) 95/51  Pulse:  80  89  Resp:  18    Temp:  98 F (36.7 C)    TempSrc:  Oral    SpO2:  100%  96%  Weight:      Height:        Intake/Output Summary (Last 24 hours) at 02/01/2017 2037 Last data filed at 01/31/2017 2215 Gross per 24 hour  Intake 300 ml  Output -  Net 300 ml   Filed Weights   01/31/17 1453  Weight: 72.6 kg (160 lb)    Examination: General appearance: Chronically ill apearing adult female, awake,  listless.   HEENT: Rash around the eyes. No nasal deformity, discharge, epistaxis.  Lips dry.   Cardiac: Tachy, regular, nl S1-S2, no murmurs appreciated.   Respiratory: Tachpyneic.  CTAB without rales or wheezes. Abdomen: Abdomen huge, ascites.   Mildly tender. MSK: No deformities or effusions. Neuro: Awake and alert but tired.  EOMI, moves all extremities. Speech fluent.    Psych: Sensorium intact and responding to questions, attention normal. Affect blunted.  Judgment and insight appear normal.    Data Reviewed: I have personally reviewed following labs and imaging studies:  CBC: Recent Labs  Lab 01/31/17 1553 01/31/17 1633  WBC 6.1  --   NEUTROABS 4.3  --   HGB 11.0* 11.6*  HCT 33.1* 34.0*  MCV 90.7  --   PLT 210  --    Basic Metabolic Panel: Recent Labs  Lab 01/31/17 1553 01/31/17 1633  NA 131* 132*  K 4.8 4.7  CL 100* 99*  CO2 23  --   GLUCOSE 205* 198*  BUN 63* 54*  CREATININE 2.43* 2.20*  CALCIUM 8.7*  --    GFR: Estimated Creatinine Clearance: 22.7 mL/min (A) (by C-G formula based on SCr of 2.2 mg/dL (H)). Liver Function Tests: Recent Labs  Lab 01/31/17 1553  AST 26  ALT 19  ALKPHOS 66  BILITOT 0.9  PROT 5.9*  ALBUMIN 3.0*   No results  for input(s): LIPASE, AMYLASE in the last 168 hours. Recent Labs  Lab 01/31/17 1553  AMMONIA 40*   Coagulation Profile: Recent Labs  Lab 01/31/17 1553  INR 1.20   Cardiac Enzymes: No results for input(s): CKTOTAL, CKMB, CKMBINDEX, TROPONINI in the last 168 hours. BNP (last 3 results) No results for input(s): PROBNP in the last 8760 hours. HbA1C: No results for input(s): HGBA1C in the last 72 hours. CBG: Recent Labs  Lab 02/01/17 0451 02/01/17 0745 02/01/17 1346 02/01/17 1613 02/01/17 2011  GLUCAP 70 75 140* 151* 177*   Lipid Profile: No results for input(s): CHOL, HDL, LDLCALC, TRIG, CHOLHDL, LDLDIRECT in the last 72 hours. Thyroid Function Tests: No results for input(s): TSH, T4TOTAL, FREET4,  T3FREE, THYROIDAB in the last 72 hours. Anemia Panel: No results for input(s): VITAMINB12, FOLATE, FERRITIN, TIBC, IRON, RETICCTPCT in the last 72 hours. Urine analysis:    Component Value Date/Time   COLORURINE YELLOW 01/19/2017 [place] 01/19/2017 1458   LABSPEC 1.011 01/19/2017 1458   PHURINE 5.0 01/19/2017 1458   GLUCOSEU NEGATIVE 01/19/2017 1458   HGBUR NEGATIVE 01/19/2017 1458   BILIRUBINUR NEGATIVE 01/19/2017 1458   KETONESUR NEGATIVE 01/19/2017 1458   PROTEINUR NEGATIVE 01/19/2017 1458   NITRITE NEGATIVE 01/19/2017 1458   LEUKOCYTESUR NEGATIVE 01/19/2017 1458   Sepsis Labs: @LABRCNTIP (procalcitonin:4,lacticidven:4)  )No results found for this or any previous visit (from the past 240 hour(s)).       Radiology Studies: Dg Chest Port 1 View  Result Date: 01/31/2017 CLINICAL DATA:  Worsening shortness of breath. EXAM: PORTABLE CHEST 1 VIEW COMPARISON:  01/19/2017 FINDINGS: Low volume chest with bandlike opacities at both bases. No edema, effusion, or pneumothorax. Normal heart size and mediastinal contours. Previous median sternotomy IMPRESSION: Low volume chest with bilateral atelectasis. Electronically Signed   By: Monte Fantasia M.D.   On: 01/31/2017 15:25   Ir Paracentesis  Result Date: 02/01/2017 INDICATION: H/O NASH wtih recurrent abdominal ascites. Request for therapeutic paracentesis. EXAM: ULTRASOUND GUIDED THERAPEUTIC PARACENTESIS MEDICATIONS: 2% lidocaine COMPLICATIONS: None immediate. PROCEDURE: Informed written consent was obtained from the patient after a discussion of the risks, benefits and alternatives to treatment. A timeout was performed prior to the initiation of the procedure. Initial ultrasound scanning demonstrates a large amount of ascites within the right lower abdominal quadrant. The right lower abdomen was prepped and draped in the usual sterile fashion. 2% lidocaine was used for local anesthesia. Following this, a 19 gauge, 7-cm,  Yueh catheter was introduced. An ultrasound image was saved for documentation purposes. The paracentesis was performed. The catheter was removed and a dressing was applied. The patient tolerated the procedure well without immediate post procedural complication. FINDINGS: A total of approximately 4.9 L of chylous fluid was removed. IMPRESSION: Successful ultrasound-guided paracentesis yielding 4.9 liters of peritoneal fluid. The patient received 50 g of albumin during the procedure. Unfortunately, the procedure had to be terminated after the above mentioned amounts secondary to hypotension. Read by: Saverio Danker, PA-C Electronically Signed   By: Jerilynn Mages.  Shick M.D.   On: 02/01/2017 13:53        Scheduled Meds: . amiodarone  100 mg Oral Daily  . furosemide  80 mg Oral BID  . heparin  5,000 Units Subcutaneous Q8H  . insulin aspart  0-9 Units Subcutaneous Q4H  . lidocaine      . mouth rinse  15 mL Mouth Rinse BID  . sodium chloride flush  3 mL Intravenous Q12H  . spironolactone  50 mg Oral  Daily   Continuous Infusions: . sodium chloride       LOS: 0 days    Time spent: 45 minutes    Edwin Dada, MD [facility] Pager [phone]  If 7PM-7AM, please contact night-coverage [email or web address] Password [facility] 02/01/2017, 8:37 PM

## 2017-02-02 DIAGNOSIS — K7460 Unspecified cirrhosis of liver: Secondary | ICD-10-CM

## 2017-02-02 DIAGNOSIS — R188 Other ascites: Secondary | ICD-10-CM

## 2017-02-02 LAB — GLUCOSE, CAPILLARY
GLUCOSE-CAPILLARY: 115 mg/dL — AB (ref 65–99)
GLUCOSE-CAPILLARY: 96 mg/dL (ref 65–99)
GLUCOSE-CAPILLARY: 327 mg/dL — AB (ref 65–99)
GLUCOSE-CAPILLARY: 316 mg/dL — AB (ref 65–99)
Glucose-Capillary: 207 mg/dL — ABNORMAL HIGH (ref 65–99)

## 2017-02-02 NOTE — Progress Notes (Signed)
[facility] 5W-09 [facility] G[facility] RN Visit at 9:30 AM  This is a related and covered GIP admission of 01/31/17 with [facility] diagnosis of cirrhosis, per Dr. Tomasa Hosteller.  Patient has an [place] DNR in the home.  Patient activated EMS after worsening shortness of breath, abdominal pain and lethargy.  Patient was admitted for treatment of decompensated liver cirrhosis.  Visited patient at bedside. She was sitting up and eating breakfast. Denies pain or discomfort this morning and states she is ready to go home. The plan is for the patient to be discharged today.  [facility] will follow daily and assist in discharge planning.  Please contact with any hospice-related questions or concerns.  Thank you,  Charlett Blake, [facility] Liaison [phone]  All [facility] hospital liaisons now live on [place].

## 2017-02-02 NOTE — Discharge Summary (Addendum)
Physician Discharge Summary  XOEY WARMOTH OVF:[id number] DOB: [birth year] DOA: 01/31/2017  PCP: Carol Ada, MD  Admit date: 01/31/2017 Discharge date: 02/02/2017  Admitted From: Home  Disposition:  Home   Recommendations for Outpatient Follow-up:  1. 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  Ascites The patient was taken for paracentesis and 4.9L were drawn off.  She was hypotensive to the 70s (baseline in the 80s only) after the procedure and was given albumin.  After the procedure, she was groggy and lethargic, but by the following morning she was stable and eager for discharge.  Please reconsider placement of Pleurx drain.  At present, my understanding from discussions with the Hospice liason is that this is being withheld because of nursing or provider concerns that emptying the drain at home would result in low blood pressures that would be problematic for some reason that is not clear to me (at least in the context of clearly defined end-of-life care).  I believe this aversion is misguided for three reasons: -Firstly that her blood pressure drops after paracentesis precisely because of the large volumes that must be taken off with the current strategy of intermittent paracentesis (and on the contrary, frequent small draining from a pleurx would result in LESS fluctuations in BP, not requiring albumin),  -Secondly because intermittent paracentesis necessarily results in waiting until she is suffering terribly from her ascites and breathing before she can be treated, and  -Thirdly because in this patient with likely weeks to live, a fear of  low blood pressure results in our focusing too much on the quantity of her remaining life and not quality.       Discharge Diagnoses:  Principal Problem:   Decompensated liver disease ([place]) Active Problems:   Cirrhosis of liver with ascites (HCC)   Ascites   Hypotension   Diabetes mellitus, type 2 (HCC)   Decompensation of cirrhosis of liver [facility])    Discharge Instructions  Discharge Instructions    Diet - low sodium heart healthy   Complete by:  As directed    Increase activity slowly   Complete by:  As directed      Allergies as of 02/02/2017      Reactions   Monosodium Glutamate Anaphylaxis   MSG   Red Dye Anaphylaxis   Erythromycin Other (See Comments)   Reaction:  Migraine    Penicillins Other (See Comments)   Reaction:  Migraine  Has patient had a PCN reaction causing immediate rash, facial/tongue/throat swelling, SOB or lightheadedness with hypotension: No Has patient had a PCN reaction causing severe rash involving mucus membranes or skin necrosis: No Has patient had a PCN reaction that required hospitalization No Has patient had a PCN reaction occurring within the last 10 years: No If all of the above answers are "NO", then may proceed with Cephalosporin use.   Statins Other (See Comments)   Reaction:  Severe leg cramps    Sulfa Antibiotics Hives   Glipizide Rash   Iohexol Rash   Tape Rash   MEDICAL "PLASTIC" TAPE CAUSES BLISTERS!!! PLEASE USE AN ALTERNATIVE      Medication List    TAKE these medications   amiodarone  200 MG tablet Commonly known as:  PACERONE Take 100 mg by mouth daily.   furosemide 80 MG tablet Commonly known as:  LASIX Take 1 tablet (80 mg total) by mouth 2 (two) times daily.   glimepiride 2 MG tablet Commonly known as:  AMARYL Take 2 mg by mouth 2 (two) times daily before a meal.   HUMULIN N 100 UNIT/ML injection Generic drug:  insulin NPH Human Inject 15-25 Units into the skin See admin instructions. 25 units in the  morning before breakfast and 15 units at bedtime   hydrOXYzine 25 MG tablet Commonly known as:  ATARAX/VISTARIL Take 25 mg by mouth every 6 (six) hours as needed for itching.   LORazepam 0.5 MG tablet Commonly known as:  ATIVAN Take 1 tablet (0.5 mg total) by mouth every 6 (six) hours as needed for anxiety.   morphine CONCENTRATE 10 mg / 0.5 ml concentrated solution Take 0.5 mLs (10 mg total) by mouth every 2 (two) hours as needed for severe pain or shortness of breath.   ondansetron 4 MG disintegrating tablet Commonly known as:  ZOFRAN-ODT Take 4 mg by mouth every 8 (eight) hours as needed for nausea/vomiting. DISSOLVE IN THE MOUTH   OXYGEN Inhale 4 L into the lungs continuous.   prochlorperazine 10 MG tablet Commonly known as:  COMPAZINE Take 10 mg by mouth every 6 (six) hours as needed for nausea or vomiting.   spironolactone 100 MG tablet Commonly known as:  ALDACTONE Take 50 mg by mouth daily.       Allergies  Allergen Reactions  . Monosodium Glutamate Anaphylaxis    MSG  . Red Dye Anaphylaxis  . Erythromycin Other (See Comments)    Reaction:  Migraine   . Penicillins Other (See Comments)    Reaction:  Migraine  Has patient had a PCN reaction causing immediate rash, facial/tongue/throat swelling, SOB or lightheadedness with hypotension: No Has patient had a PCN reaction causing severe rash involving mucus membranes or skin necrosis: No Has patient had a PCN reaction that required hospitalization No Has patient had a PCN reaction occurring within the last 10 years: No If all of the above answers are "NO", then may proceed with Cephalosporin use.  . Statins Other (See Comments)    Reaction:  Severe leg cramps   . Sulfa Antibiotics Hives  . Glipizide Rash  . Iohexol Rash  . Tape Rash    MEDICAL "PLASTIC" TAPE CAUSES BLISTERS!!! PLEASE USE AN ALTERNATIVE    Consultations:  Paracentesis   Procedures/Studies: Dg Chest 2 View  Result Date:  01/19/2017 CLINICAL DATA:  Shortness of breath over the last week. Cirrhosis of the liver and congestive heart failure. EXAM: CHEST  2 VIEW COMPARISON:  11/19/2016 FINDINGS: Previous median sternotomy. Normal heart size. Normal mediastinal shadows. Chronic scarring at the lung bases. No sign of active infiltrate, mass, effusion or level are collapse. No significant bone finding. IMPRESSION: Chronic basilar pulmonary scarring.  No active process identified. Electronically Signed   By: Nelson Chimes M.D.   On: 01/19/2017 15:12   Dg Chest Port 1 View  Result Date: 01/31/2017 CLINICAL DATA:  Worsening shortness of breath. EXAM: PORTABLE CHEST 1 VIEW COMPARISON:  01/19/2017 FINDINGS: Low volume chest with bandlike opacities at both bases. No edema, effusion, or pneumothorax. Normal heart size and mediastinal contours. Previous median sternotomy IMPRESSION: Low volume chest with bilateral atelectasis. Electronically Signed   By: Monte Fantasia M.D.   On: 01/31/2017 15:25   Ir Paracentesis  Result  Date: 02/01/2017 INDICATION: H/O NASH wtih recurrent abdominal ascites. Request for therapeutic paracentesis. EXAM: ULTRASOUND GUIDED THERAPEUTIC PARACENTESIS MEDICATIONS: 2% lidocaine COMPLICATIONS: None immediate. PROCEDURE: Informed written consent was obtained from the patient after a discussion of the risks, benefits and alternatives to treatment. A timeout was performed prior to the initiation of the procedure. Initial ultrasound scanning demonstrates a large amount of ascites within the right lower abdominal quadrant. The right lower abdomen was prepped and draped in the usual sterile fashion. 2% lidocaine was used for local anesthesia. Following this, a 19 gauge, 7-cm, Yueh catheter was introduced. An ultrasound image was saved for documentation purposes. The paracentesis was performed. The catheter was removed and a dressing was applied. The patient tolerated the procedure well without immediate post procedural  complication. FINDINGS: A total of approximately 4.9 L of chylous fluid was removed. IMPRESSION: Successful ultrasound-guided paracentesis yielding 4.9 liters of peritoneal fluid. The patient received 50 g of albumin during the procedure. Unfortunately, the procedure had to be terminated after the above mentioned amounts secondary to hypotension. Read by: Saverio Danker, PA-C Electronically Signed   By: Jerilynn Mages.  Shick M.D.   On: 02/01/2017 13:53   Ir Paracentesis  Result Date: 01/12/2017 INDICATION: Abdominal distention secondary to recurrent ascites. Request therapeutic paracentesis up tonight L max. EXAM: ULTRASOUND GUIDED RIGHT LOWER QUADRANT PARACENTESIS MEDICATIONS: None. COMPLICATIONS: None immediate. PROCEDURE: Informed written consent was obtained from the patient after a discussion of the risks, benefits and alternatives to treatment. A timeout was performed prior to the initiation of the procedure. Initial ultrasound scanning demonstrates a large amount of ascites within the right lower abdominal quadrant. The right lower abdomen was prepped and draped in the usual sterile fashion. 1% lidocaine with epinephrine was used for local anesthesia. Following this, a 19 gauge, 7-cm, Yueh catheter was introduced. An ultrasound image was saved for documentation purposes. The paracentesis was performed. The catheter was removed and a dressing was applied. The patient tolerated the procedure well without immediate post procedural complication. FINDINGS: A total of approximately 9 L of milky yellow fluid was removed. IMPRESSION: Successful ultrasound-guided paracentesis yielding 9 liters of peritoneal fluid. Read by: Ascencion Dike PA-C Electronically Signed   By: Sandi Mariscal M.D.   On: 01/12/2017 11:52       Subjective: Feeling oky.  Drinking coffee by the window. Eager to go home and be with her dogs.  Abdomen still swollen.  Discharge Exam: Vitals:   02/01/17 2017 02/02/17 0420  BP: (!) 95/51 91/63   Pulse: 89 88  Resp: 18 18  Temp: 98.7 F (37.1 C) 98.3 F (36.8 C)  SpO2: 96% 95%   Vitals:   02/01/17 1344 02/01/17 1358 02/01/17 2017 02/02/17 0420  BP: (!) 89/52 (!) 90/54 (!) 95/51 91/63  Pulse: 80  89 88  Resp: 18  18 18   Temp: 98 F (36.7 C)  98.7 F (37.1 C) 98.3 F (36.8 C)  TempSrc: Oral  Oral Oral  SpO2: 100%  96% 95%  Weight:      Height:        General: Pt is alert, awake, appears tired Cardiovascular: RRR, S1/S2 +, no rubs, no gallops Respiratory: CTA bilaterally, no wheezing, no rhonchi Abdominal: Still marked ascites. Extremities: no edema, no cyanosis    The results of significant diagnostics from this hospitalization (including imaging, microbiology, ancillary and laboratory) are listed below for reference.     Microbiology: No results found for this or any previous visit (from the past 240 hour(s)).  Labs: BNP (last 3 results) Recent Labs    05/10/16 1431 06/25/16 1658 11/19/16 1500  BNP 114.7* 148.4* 62.2   Basic Metabolic Panel: Recent Labs  Lab 01/31/17 1553 01/31/17 1633  NA 131* 132*  K 4.8 4.7  CL 100* 99*  CO2 23  --   GLUCOSE 205* 198*  BUN 63* 54*  CREATININE 2.43* 2.20*  CALCIUM 8.7*  --    Liver Function Tests: Recent Labs  Lab 01/31/17 1553  AST 26  ALT 19  ALKPHOS 66  BILITOT 0.9  PROT 5.9*  ALBUMIN 3.0*   No results for input(s): LIPASE, AMYLASE in the last 168 hours. Recent Labs  Lab 01/31/17 1553  AMMONIA 40*   CBC: Recent Labs  Lab 01/31/17 1553 01/31/17 1633  WBC 6.1  --   NEUTROABS 4.3  --   HGB 11.0* 11.6*  HCT 33.1* 34.0*  MCV 90.7  --   PLT 210  --    Cardiac Enzymes: No results for input(s): CKTOTAL, CKMB, CKMBINDEX, TROPONINI in the last 168 hours. BNP: Invalid input(s): POCBNP CBG: Recent Labs  Lab 02/01/17 2011 02/02/17 0013 02/02/17 0418 02/02/17 0829 02/02/17 1152  GLUCAP 177* 207* 115* 96 327*   D-Dimer No results for input(s): DDIMER in the last 72 hours. Hgb  A1c No results for input(s): HGBA1C in the last 72 hours. Lipid Profile No results for input(s): CHOL, HDL, LDLCALC, TRIG, CHOLHDL, LDLDIRECT in the last 72 hours. Thyroid function studies No results for input(s): TSH, T4TOTAL, T3FREE, THYROIDAB in the last 72 hours.  Invalid input(s): FREET3 Anemia work up No results for input(s): VITAMINB12, FOLATE, FERRITIN, TIBC, IRON, RETICCTPCT in the last 72 hours. Urinalysis    Component Value Date/Time   COLORURINE YELLOW 01/19/2017 1458   APPEARANCEUR CLEAR 01/19/2017 1458   LABSPEC 1.011 01/19/2017 1458   PHURINE 5.0 01/19/2017 1458   GLUCOSEU NEGATIVE 01/19/2017 1458   HGBUR NEGATIVE 01/19/2017 1458   BILIRUBINUR NEGATIVE 01/19/2017 1458   KETONESUR NEGATIVE 01/19/2017 1458   PROTEINUR NEGATIVE 01/19/2017 1458   NITRITE NEGATIVE 01/19/2017 1458   LEUKOCYTESUR NEGATIVE 01/19/2017 1458   Sepsis Labs Invalid input(s): PROCALCITONIN,  WBC,  LACTICIDVEN Microbiology No results found for this or any previous visit (from the past 240 hour(s)).   Time coordinating discharge: Over 30 minutes  SIGNED:   Edwin Dada, MD  [facility] 02/02/2017, 1:15 PM   If 7PM-7AM, please contact night-coverage [email or web address] Password [email or web address]

## 2017-02-02 NOTE — Progress Notes (Signed)
Patient discharge teaching given, including activity, diet, follow-up appoints, and medications. Patient verbalized understanding of all discharge instructions. IV access was d/c'd. Vitals are stable. Skin is intact except as charted in most recent assessments. Pt to be escorted out by [facility].

## 2017-02-07 ENCOUNTER — Other Ambulatory Visit: Payer: Self-pay

## 2017-02-07 ENCOUNTER — Emergency Department (HOSPITAL_COMMUNITY)
Admission: EM | Admit: 2017-02-07 | Discharge: 2017-02-07 | Disposition: A | Discharge status: Home / Self Care | Attending: Emergency Medicine | Admitting: Emergency Medicine

## 2017-02-07 ENCOUNTER — Encounter (HOSPITAL_COMMUNITY): Payer: Self-pay

## 2017-02-07 ENCOUNTER — Emergency Department (HOSPITAL_COMMUNITY)
Admit: 2017-02-07 | Discharge: 2017-02-07 | Disposition: A | Discharge status: Home / Self Care | Attending: Emergency Medicine | Admitting: Emergency Medicine

## 2017-02-07 ENCOUNTER — Emergency Department (HOSPITAL_COMMUNITY)

## 2017-02-07 DIAGNOSIS — R109 Unspecified abdominal pain: Secondary | ICD-10-CM | POA: Insufficient documentation

## 2017-02-07 DIAGNOSIS — M549 Dorsalgia, unspecified: Secondary | ICD-10-CM | POA: Insufficient documentation

## 2017-02-07 DIAGNOSIS — N184 Chronic kidney disease, stage 4 (severe): Secondary | ICD-10-CM | POA: Insufficient documentation

## 2017-02-07 DIAGNOSIS — W07XXXA Fall from chair, initial encounter: Secondary | ICD-10-CM | POA: Insufficient documentation

## 2017-02-07 DIAGNOSIS — Y9389 Activity, other specified: Secondary | ICD-10-CM | POA: Diagnosis not present

## 2017-02-07 DIAGNOSIS — I130 Hypertensive heart and chronic kidney disease with heart failure and stage 1 through stage 4 chronic kidney disease, or unspecified chronic kidney disease: Secondary | ICD-10-CM | POA: Diagnosis not present

## 2017-02-07 DIAGNOSIS — S199XXA Unspecified injury of neck, initial encounter: Secondary | ICD-10-CM | POA: Insufficient documentation

## 2017-02-07 DIAGNOSIS — I5032 Chronic diastolic (congestive) heart failure: Secondary | ICD-10-CM | POA: Insufficient documentation

## 2017-02-07 DIAGNOSIS — Y999 Unspecified external cause status: Secondary | ICD-10-CM | POA: Insufficient documentation

## 2017-02-07 DIAGNOSIS — Z87891 Personal history of nicotine dependence: Secondary | ICD-10-CM | POA: Insufficient documentation

## 2017-02-07 DIAGNOSIS — S0990XA Unspecified injury of head, initial encounter: Secondary | ICD-10-CM | POA: Diagnosis present

## 2017-02-07 DIAGNOSIS — Z79899 Other long term (current) drug therapy: Secondary | ICD-10-CM | POA: Diagnosis not present

## 2017-02-07 DIAGNOSIS — R0600 Dyspnea, unspecified: Secondary | ICD-10-CM | POA: Diagnosis not present

## 2017-02-07 DIAGNOSIS — K7031 Alcoholic cirrhosis of liver with ascites: Secondary | ICD-10-CM | POA: Diagnosis not present

## 2017-02-07 DIAGNOSIS — W19XXXA Unspecified fall, initial encounter: Secondary | ICD-10-CM

## 2017-02-07 DIAGNOSIS — T148XXA Other injury of unspecified body region, initial encounter: Secondary | ICD-10-CM | POA: Diagnosis not present

## 2017-02-07 DIAGNOSIS — M542 Cervicalgia: Secondary | ICD-10-CM | POA: Diagnosis not present

## 2017-02-07 DIAGNOSIS — Y92018 Other place in single-family (private) house as the place of occurrence of the external cause: Secondary | ICD-10-CM | POA: Diagnosis not present

## 2017-02-07 LAB — COMPREHENSIVE METABOLIC PANEL
ALBUMIN: 2.9 g/dL — AB (ref 3.5–5.0)
ALT: 16 U/L (ref 14–54)
ANION GAP: 8 (ref 5–15)
AST: 23 U/L (ref 15–41)
Alkaline Phosphatase: 57 U/L (ref 38–126)
BILIRUBIN TOTAL: 0.9 mg/dL (ref 0.3–1.2)
BUN: 65 mg/dL — ABNORMAL HIGH (ref 6–20)
CO2: 22 mmol/L (ref 22–32)
Calcium: 8.7 mg/dL — ABNORMAL LOW (ref 8.9–10.3)
Chloride: 101 mmol/L (ref 101–111)
Creatinine, Ser: 2.45 mg/dL — ABNORMAL HIGH (ref 0.44–1.00)
GFR calc non Af Amer: 19 mL/min — ABNORMAL LOW (ref >60)
GFR, EST AFRICAN AMERICAN: 22 mL/min — AB (ref >60)
GLUCOSE: 146 mg/dL — AB (ref 65–99)
POTASSIUM: 4.8 mmol/L (ref 3.5–5.1)
SODIUM: 131 mmol/L — AB (ref 135–145)
TOTAL PROTEIN: 5.7 g/dL — AB (ref 6.5–8.1)

## 2017-02-07 LAB — CBC
HEMATOCRIT: 34.2 % — AB (ref 36.0–46.0)
Hemoglobin: 11.5 g/dL — ABNORMAL LOW (ref 12.0–15.0)
MCH: 30.3 pg (ref 26.0–34.0)
MCHC: 33.6 g/dL (ref 30.0–36.0)
MCV: 90 fL (ref 78.0–100.0)
Platelets: 212 10*3/uL (ref 150–400)
RBC: 3.8 MIL/uL — ABNORMAL LOW (ref 3.87–5.11)
RDW: 13.7 % (ref 11.5–15.5)
WBC: 6.7 10*3/uL (ref 4.0–10.5)

## 2017-02-07 LAB — URINALYSIS, ROUTINE W REFLEX MICROSCOPIC
Bilirubin Urine: NEGATIVE
Glucose, UA: NEGATIVE mg/dL
Hgb urine dipstick: NEGATIVE
KETONES UR: NEGATIVE mg/dL
LEUKOCYTES UA: NEGATIVE
NITRITE: NEGATIVE
PH: 5 (ref 5.0–8.0)
PROTEIN: NEGATIVE mg/dL
Specific Gravity, Urine: 1.013 (ref 1.005–1.030)

## 2017-02-07 LAB — LIPASE, BLOOD: Lipase: 49 U/L (ref 11–51)

## 2017-02-07 LAB — BRAIN NATRIURETIC PEPTIDE: B Natriuretic Peptide: 97.1 pg/mL (ref 0.0–100.0)

## 2017-02-07 MED ORDER — LIDOCAINE HCL 1 % IJ SOLN
INTRAMUSCULAR | Status: AC
  Filled 2017-02-07: qty 10

## 2017-02-07 NOTE — Procedures (Signed)
Ultrasound-guided  therapeutic paracentesis performed yielding 4.8 liters of milky/chylous fluid. No immediate complications.  Due to patient hypotension only the above amount of fluid was removed today.

## 2017-02-07 NOTE — ED Notes (Signed)
Bed: WA01 Expected date:  Expected time:  Means of arrival:  Comments: 71 yo neck/ back pain post fall last pm

## 2017-02-07 NOTE — ED Triage Notes (Signed)
Pt arrives from home via [facility]. Pt has c/o SOB and a fall yesterday. Pt has a hx of ascites and was last drained 12/17 per hospice nurse. Pt fell on the floor yesterday and hit her back and has some muscle pain in her neck and back with a reported headache. Pt reports taking ibuprofen with no relief. Pt had "25 mg of morphine" per significant other at home. Wheezing auditory at bedside. Pt's abdomen is distended and firm.     Hospice made aware pt is here.

## 2017-02-07 NOTE — Progress Notes (Addendum)
[facility]: RN visit  Patient brought to ED for SOB and headache following a fall on 02/06/2017.  Patient off floor for paracentesis. Spoke with Joline Maxcy, PA and currently there is no plan to admit.   If patient requires ambulance transport please call [facility] for transport home when discharged.   [facility] will continue to follow while in hospital. Please call with any hospice related questions.  Farrel Gordon, RN, [place] [facility] Liaison [phone]  All hospital liaisons are on [place].

## 2017-02-07 NOTE — ED Provider Notes (Signed)
Care assumed from [facility], PA-C, pending patient's return from IR and re-evaluation. See her note for full HPI and workup. In brief, pt on hospice care, reporting s/p mechanical fall. CT head and c-spine neg. Pt with chronic liver dz, gets frequent therapeutic paracentesis and complaining of SOB in ED. Pt sent to IR for therapeutic para in ED. Plan for discharge following return from procedure.  Physical Exam  BP 111/64   Pulse 88   Temp 98 F (36.7 C) (Oral)   Resp 16   Ht 5\' 3"  (1.6 m)   Wt 85.8 kg (189 lb 4 oz)   SpO2 100%   BMI 33.52 kg/m   Physical Exam  Constitutional: She appears well-developed and well-nourished.  Chronically ill-appearing, not in distress. Normal work of breathing.  HENT:  Head: Normocephalic and atraumatic.  Eyes: Conjunctivae are normal.  Cardiovascular: Normal rate.  Pulmonary/Chest: Effort normal.  Abdominal: Soft.  Neurological: She is alert.  Skin: Skin is warm.  Psychiatric: She has a normal mood and affect. Her behavior is normal.  Nursing note and vitals reviewed.   ED Course/Procedures     Procedures  MDM  CMP at baseline. Pt with improvement in SOB since paracentesis. BP prior to discharge 111/65. Pt states she feeling better and is ready for discharge home.  Return precautions discussed.       Robinson, [place] N, PA-C 02/07/17 1807    Gareth Morgan, MD 02/10/17 1316

## 2017-02-07 NOTE — ED Provider Notes (Signed)
[facility] DEPT Provider Note   CSN: [id number] Arrival date & time: 02/07/17  1310     History   Chief Complaint Chief Complaint  Patient presents with  . Fall  . Abdominal Pain  . Back Pain  . Neck Injury    HPI Natalie Lane is a 71 y.o. 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 The patient also sustained a fall from sitting last night. She endorses a constant headache and neck pain that began after the fall. She denies dizziness, lightheadedness, changes to her vision, numbness, or weakness. She took one tablet of morphine PTA.   A review of the patient's record indicates the patient is on home comfort care. She was d/c'ed on 12/19 with recommendations for f/u care to reconsider a Pleurx drain for scheduled OP paracentesis. She was previously d/c'ed home with morphine, but family, the patient's son and daughter-in-law reports she has not been taking the medication at home because she is afraid that she will stop breathing. They state she feels comfortable taking pain medication in the ED because "she is under our care even though she is a DNR", but is afraid she will stop breathing at home.   She is on home hospice with comfort care wishes.    She is a DNR.    The history is provided by the patient. No language interpreter was used.    Past Medical History:  Diagnosis Date  . Anemia   . Ascites   . CHD (congenital heart disease)    a. 1979 s/p septal defect repair (? VSD vs ASD), old op notes not available;  b. 04/2016 Echo: EF 60-65%, no rwma, Gr2 DD, mild AS, mild TR, no PFO (prev repaired).  . Chronic diastolic CHF (congestive heart failure) ([place])    04/2016 Echo: EF 60-65%, no rwma, Gr2 DD.  Marland Kitchen Complication of anesthesia   . 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 04/2016 s/p paracentesis.  . Diabetes mellitus, type 2 ([place])   . Dyspnea   . Edema   . Gastric varices    a. s/p banding.  Marland Kitchen GERD (gastroesophageal reflux disease)   . History of blood transfusion   . Hyperlipidemia   . Normal stress echocardiogram 2008  . Pancreatic adenoma   . Paroxysmal atrial flutter ([place])    a. 11/2009 s/p RFCA.  Marland Kitchen PONV (postoperative nausea and vomiting)    with Cholecystectomy  . Tobacco abuse    a. smokes 4 cigarettes/day.    Patient Active Problem List   Diagnosis Date Noted  . Decompensation of cirrhosis of liver ([place]) 01/31/2017  . CKD (chronic kidney disease), stage IV ([place])   . Advance care planning   . Goals of care, counseling/discussion   . Palliative care by specialist   . Tobacco abuse   . PONV (postoperative nausea and vomiting)   . Paroxysmal atrial flutter ([place])   . Pancreatic adenoma   . History of blood transfusion   . GERD (gastroesophageal reflux disease)   . Gastric varices   . Dyspnea   . Diabetes mellitus, type 2 ([place])   . 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 Complication of anesthesia   . Chronic diastolic CHF (congestive heart failure) ([place])   . Anemia   . SOB (  shortness of breath)   . Hyperkalemia 09/02/2016  . Hypotension 09/02/2016  . Ascites of liver 09/02/2016  . Pressure injury of skin 08/24/2016  . ARF (acute renal failure) ([place]) 06/26/2016  . Tobacco use 06/26/2016  . Decompensated liver disease ([place]) 06/26/2016  . Acute kidney injury ([place])   . PAT (paroxysmal atrial tachycardia) ([place]) 05/12/2016  . Ascites 05/10/2016  . Decompensated hepatic cirrhosis ([place]) 05/10/2016  . Esophageal varices ([place]) 01/23/2014  . Cirrhosis of liver with ascites ([place]) 11/16/2013  . Chest pain at rest 09/23/2011  . Heart palpitations 12/03/2010  . Atrial flutter ([place])   . CHD (congenital heart disease)   . Edema   . Hyperlipidemia   . Poorly controlled type 2 diabetes mellitus with peripheral neuropathy [facility])      Past Surgical History:  Procedure Laterality Date  . ABDOMINAL HYSTERECTOMY    . ASD versus VSD repair  1979   Operative notes not available.  . ATRIAL ABLATION SURGERY  10 /2011  . [place]   x2  . CHOLECYSTECTOMY    . ESOPHAGOGASTRODUODENOSCOPY (EGD) WITH PROPOFOL N/A 11/16/2013   Procedure: ESOPHAGOGASTRODUODENOSCOPY (EGD) WITH PROPOFOL;  Surgeon: Lear Ng, MD;  Location: [place];  Service: Endoscopy;  Laterality: N/A;  . ESOPHAGOGASTRODUODENOSCOPY (EGD) WITH PROPOFOL N/A 01/23/2014   Procedure: ESOPHAGOGASTRODUODENOSCOPY (EGD) WITH PROPOFOL;  Surgeon: Lear Ng, MD;  Location: [place];  Service: Endoscopy;  Laterality: N/A;  . GASTRIC VARICES BANDING N/A 11/16/2013   Procedure: GASTRIC VARICES BANDING;  Surgeon: Lear Ng, MD;  Location: [place];  Service: Endoscopy;  Laterality: N/A;  . GASTRIC VARICES BANDING N/A 01/23/2014   Procedure: GASTRIC VARICES BANDING;  Surgeon: Lear Ng, MD;  Location: [place];  Service: Endoscopy;  Laterality: N/A;  . HERNIA REPAIR    . IR GENERIC HISTORICAL  05/11/2016   IR PARACENTESIS 05/11/2016 Ascencion Dike, PA-C [facility]-INTERV RAD  . IR PARACENTESIS  07/08/2016  . IR PARACENTESIS  08/09/2016  . IR PARACENTESIS  08/24/2016  . IR PARACENTESIS  09/03/2016  . IR PARACENTESIS  09/16/2016  . IR PARACENTESIS  09/23/2016  . IR PARACENTESIS  10/07/2016  . IR PARACENTESIS  10/14/2016  . IR PARACENTESIS  10/27/2016  . IR PARACENTESIS  11/09/2016  . IR PARACENTESIS  11/30/2016  . IR PARACENTESIS  12/06/2016  . IR PARACENTESIS  12/13/2016  . IR PARACENTESIS  12/23/2016  . IR PARACENTESIS  12/30/2016  . IR PARACENTESIS  01/12/2017  . IR PARACENTESIS  02/01/2017  . OTHER SURGICAL HISTORY     partial hysterectomy  . PARACENTESIS  09/03/2016    OB History    No data available       Home Medications    Prior to Admission medications   Medication Sig Start Date End Date Taking? Authorizing  Provider  amiodarone (PACERONE) 200 MG tablet Take 100 mg by mouth daily.   Yes [provider]  furosemide (LASIX) 80 MG tablet Take 1 tablet (80 mg total) by mouth 2 (two) times daily. 09/05/16  Yes Velvet Bathe, MD  glimepiride (AMARYL) 2 MG tablet Take 2 mg by mouth 2 (two) times daily before a meal.   Yes [provider]  HUMULIN N 100 UNIT/ML injection Inject 15-25 Units into the skin See admin instructions. 25 units in the morning before breakfast and 15 units at bedtime 12/15/16  Yes [provider]  hydrOXYzine (ATARAX/VISTARIL) 25 MG tablet Take 25 mg by mouth every 6 (six) hours as needed  for itching.   Yes [provider]  LORazepam (ATIVAN) 0.5 MG tablet Take 1 tablet (0.5 mg total) by mouth every 6 (six) hours as needed for anxiety. 01/21/17  Yes Charlynne Cousins, MD  Morphine Sulfate (MORPHINE CONCENTRATE) 10 mg / 0.5 ml concentrated solution Take 0.5 mLs (10 mg total) by mouth every 2 (two) hours as needed for severe pain or shortness of breath. 01/21/17  Yes Charlynne Cousins, MD  ondansetron (ZOFRAN-ODT) 4 MG disintegrating tablet Take 4 mg by mouth every 8 (eight) hours as needed for nausea/vomiting. DISSOLVE IN THE MOUTH 12/07/16  Yes [provider]  OXYGEN Inhale 4 L into the lungs continuous.   Yes [provider]  prochlorperazine (COMPAZINE) 10 MG tablet Take 10 mg by mouth every 6 (six) hours as needed for nausea or vomiting.   Yes [provider]  Sennosides (SENOKOT PO) Take by mouth.   Yes [provider]  spironolactone (ALDACTONE) 100 MG tablet Take 50 mg by mouth daily.   Yes [provider]    Family History Family History  Problem Relation Age of Onset  . Hypertension Mother   . Kidney disease Sister   . Depression Paternal Grandfather   . Diabetes Brother     Social History Social History   Tobacco Use  . Smoking status: Former Smoker    Packs/day: 0.50    Years:  36.00    Pack years: 18.00    Types: Cigarettes    Last attempt to quit: 02/16/2016    Years since quitting: 0.9  . Smokeless tobacco: Never Used  . Tobacco comment: was smoking 4 cigs/day prior to admission.  Substance Use Topics  . Alcohol use: No  . Drug use: No     Allergies   Monosodium glutamate; Red dye; Erythromycin; Penicillins; Statins; Sulfa antibiotics; Glipizide; Iohexol; and Tape   Review of Systems Review of Systems  Constitutional: Negative for activity change, chills and fever.  HENT: Negative for congestion.   Eyes: Negative for visual disturbance.  Respiratory: Positive for shortness of breath.   Cardiovascular: Negative for chest pain.  Gastrointestinal: Positive for abdominal distention and abdominal pain. Negative for nausea and vomiting.  Genitourinary: Negative for dysuria.  Musculoskeletal: Positive for back pain and neck pain.  Skin: Positive for rash.  Allergic/Immunologic: Positive for immunocompromised state.  Neurological: Positive for headaches. Negative for syncope, weakness and light-headedness.  Psychiatric/Behavioral: Negative for confusion.     Physical Exam Updated Vital Signs BP 97/60   Pulse 86   Temp 98 F (36.7 C) (Oral)   Resp 18   Ht 5\' 3"  (1.6 m)   Wt 85.8 kg (189 lb 4 oz)   SpO2 100%   BMI 33.52 kg/m   Physical Exam  Constitutional: She is oriented to person, place, and time. She appears well-developed and well-nourished. No distress.  HENT:  Head: Normocephalic and atraumatic.  Eyes: Conjunctivae are normal.  Neck: Normal range of motion. Neck supple.  Cardiovascular: Normal rate, regular rhythm, normal heart sounds and intact distal pulses. Exam reveals no gallop and no friction rub.  No murmur heard. Pulmonary/Chest: Effort normal. No stridor. No respiratory distress. She has no wheezes. She has no rales.  Abdominal: Soft. Bowel sounds are normal. She exhibits distension and ascites. There is no rebound, no guarding  and no CVA tenderness.  Severely distended, hard abdomen.  Musculoskeletal: Normal range of motion.  Neurological: She is alert and oriented to person, place, and time.  Skin:  Skin is warm. No rash noted.  There is an erythematous maculopapular rash over the bilateral abdomen and bilateral upper back, concerning for a contact dermatitis.   Psychiatric: Her behavior is normal.  Nursing note and vitals reviewed.    ED Treatments / Results  Labs (all labs ordered are listed, but only abnormal results are displayed) Labs Reviewed  CBC - Abnormal; Notable for the following components:      Result Value   RBC 3.80 (*)    Hemoglobin 11.5 (*)    HCT 34.2 (*)    All other components within normal limits  URINALYSIS, ROUTINE W REFLEX MICROSCOPIC - Abnormal; Notable for the following components:   Color, Urine AMBER (*)    All other components within normal limits  BRAIN NATRIURETIC PEPTIDE  COMPREHENSIVE METABOLIC PANEL  LIPASE, BLOOD    EKG  EKG Interpretation  Date/Time:  Monday February 07 2017 14:28:29 EST Ventricular Rate:  87 PR Interval:    QRS Duration: 128 QT Interval:  385 QTC Calculation: 464 R Axis:   59 Text Interpretation:  Sinus rhythm Right bundle branch block No significant change since last tracing Confirmed by Gareth Morgan [phone]) on 02/07/2017 2:57:28 PM       Radiology Dg Chest 2 View  Result Date: 02/07/2017 CLINICAL DATA:  Per order- dyspnea Pt has c/o SOB and a fall yesterday. Pt states she has no other chest complaints at this time.PT HX: CHF, DM EXAM: CHEST  2 VIEW COMPARISON:  01/31/2017 FINDINGS: Linear opacities are noted at the lung bases consistent with atelectasis, stable from prior study. Lungs are otherwise clear. Lung volumes are low. No pleural effusion or pneumothorax. Cardiac silhouette is normal size. Changes from a previous median sternotomy are stable. No mediastinal or hilar masses or convincing adenopathy. Skeletal structures are  intact. IMPRESSION: 1. No acute cardiopulmonary disease. 2. Low lung volumes with lung base atelectasis stable from the prior study. Electronically Signed   By: Lajean Manes M.D.   On: 02/07/2017 14:58   Ct Head Wo Contrast  Result Date: 02/07/2017 CLINICAL DATA:  Fall yesterday. EXAM: CT HEAD WITHOUT CONTRAST CT CERVICAL SPINE WITHOUT CONTRAST TECHNIQUE: Multidetector CT imaging of the head and cervical spine was performed following the standard protocol without intravenous contrast. Multiplanar CT image reconstructions of the cervical spine were also generated. COMPARISON:  Head CT 12/12/2009 FINDINGS: CT HEAD FINDINGS Brain: Ventricles, cisterns and other CSF spaces are within normal. There is no mass, mass effect, shift of midline structures or acute hemorrhage. There is mild chronic ischemic microvascular disease present. Vascular: No hyperdense vessel or unexpected calcification. Skull: Normal. Negative for fracture or focal lesion. Sinuses/Orbits: No acute finding. Other: None. CT CERVICAL SPINE FINDINGS Alignment: Normal. Skull base and vertebrae: Mild spondylosis of the cervical spine. Vertebral body heights are maintained. Atlantoaxial articulation is within normal. No evidence of acute fracture or subluxation. Soft tissues and spinal canal: No prevertebral fluid or swelling. No visible canal hematoma. Disc levels:  Within normal. Upper chest: Negative. Other: None. IMPRESSION: No acute brain injury. Mild chronic ischemic microvascular disease. No acute cervical spine injury. Mild spondylosis of the cervical spine. Electronically Signed   By: Marin Olp M.D.   On: 02/07/2017 15:30   Ct Cervical Spine Wo Contrast  Result Date: 02/07/2017 CLINICAL DATA:  Fall yesterday. EXAM: CT HEAD WITHOUT CONTRAST CT CERVICAL SPINE WITHOUT CONTRAST TECHNIQUE: Multidetector CT imaging of the head and cervical spine was performed following the standard protocol without intravenous contrast. Multiplanar CT  image reconstructions of the cervical spine were also generated. COMPARISON:  Head CT 12/12/2009 FINDINGS: CT HEAD FINDINGS Brain: Ventricles, cisterns and other CSF spaces are within normal. There is no mass, mass effect, shift of midline structures or acute hemorrhage. There is mild chronic ischemic microvascular disease present. Vascular: No hyperdense vessel or unexpected calcification. Skull: Normal. Negative for fracture or focal lesion. Sinuses/Orbits: No acute finding. Other: None. CT CERVICAL SPINE FINDINGS Alignment: Normal. Skull base and vertebrae: Mild spondylosis of the cervical spine. Vertebral body heights are maintained. Atlantoaxial articulation is within normal. No evidence of acute fracture or subluxation. Soft tissues and spinal canal: No prevertebral fluid or swelling. No visible canal hematoma. Disc levels:  Within normal. Upper chest: Negative. Other: None. IMPRESSION: No acute brain injury. Mild chronic ischemic microvascular disease. No acute cervical spine injury. Mild spondylosis of the cervical spine. Electronically Signed   By: Marin Olp M.D.   On: 02/07/2017 15:30    Procedures Procedures (including critical care time)  Medications Ordered in ED Medications - No data to display   Initial Impression / Assessment and Plan / ED Course  I have reviewed the triage vital signs and the nursing notes.  Pertinent labs & imaging results that were available during my care of the patient were reviewed by me and considered in my medical decision making (see chart for details).     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 She is here with dyspnea as well as a headache and neck pain after she sustained a fall last night. CT head and cervical spine are negative. CXR unchanged from previous. CBC is unremarkable. Labs are pending. Consulted IR who will perform a paracentesis, which is  currently in process. CMP and BNP are pending. Given patient's comfort care status, anticipate d/cing patient to home after paracentesis if she has no other complaints or concerns related to comfort care. May consider further workup, pending patient's wishes, if dyspnea does not resolved after paracentesis. Patient care transferred to [facility] at the end of my shift. Patient presentation, ED course, and plan of care discussed with review of all pertinent labs and imaging. Please see his/her note for further details regarding further ED course and disposition.  Final Clinical Impressions(s) / ED Diagnoses   Final diagnoses:  None    ED Discharge Orders        Ordered    US Paracentesis     02/07/17 1526       Adhrit Krenz A, PA-C 02/07/17 1739    Gareth Morgan, MD 02/10/17 1316

## 2017-02-07 NOTE — Discharge Instructions (Signed)
Return to the ER for worsening shortness of breath, or new or concerning symptoms.

## 2017-02-15 ENCOUNTER — Encounter (HOSPITAL_COMMUNITY): Payer: Self-pay | Admitting: Nurse Practitioner

## 2017-02-15 ENCOUNTER — Emergency Department (HOSPITAL_COMMUNITY)

## 2017-02-15 ENCOUNTER — Emergency Department (HOSPITAL_COMMUNITY)
Admission: EM | Admit: 2017-02-15 | Discharge: 2017-02-15 | Disposition: A | Discharge status: Home / Self Care | Attending: Emergency Medicine | Admitting: Emergency Medicine

## 2017-02-15 DIAGNOSIS — R600 Localized edema: Secondary | ICD-10-CM | POA: Diagnosis present

## 2017-02-15 DIAGNOSIS — N189 Chronic kidney disease, unspecified: Secondary | ICD-10-CM | POA: Diagnosis not present

## 2017-02-15 DIAGNOSIS — E1122 Type 2 diabetes mellitus with diabetic chronic kidney disease: Secondary | ICD-10-CM | POA: Diagnosis not present

## 2017-02-15 DIAGNOSIS — Z79899 Other long term (current) drug therapy: Secondary | ICD-10-CM | POA: Insufficient documentation

## 2017-02-15 DIAGNOSIS — I8500 Esophageal varices without bleeding: Secondary | ICD-10-CM | POA: Diagnosis not present

## 2017-02-15 DIAGNOSIS — R188 Other ascites: Secondary | ICD-10-CM | POA: Insufficient documentation

## 2017-02-15 DIAGNOSIS — I5032 Chronic diastolic (congestive) heart failure: Secondary | ICD-10-CM | POA: Insufficient documentation

## 2017-02-15 DIAGNOSIS — Z794 Long term (current) use of insulin: Secondary | ICD-10-CM | POA: Insufficient documentation

## 2017-02-15 DIAGNOSIS — N184 Chronic kidney disease, stage 4 (severe): Secondary | ICD-10-CM | POA: Diagnosis not present

## 2017-02-15 DIAGNOSIS — K7460 Unspecified cirrhosis of liver: Secondary | ICD-10-CM | POA: Insufficient documentation

## 2017-02-15 DIAGNOSIS — K7291 Hepatic failure, unspecified with coma: Secondary | ICD-10-CM | POA: Diagnosis not present

## 2017-02-15 DIAGNOSIS — Z87891 Personal history of nicotine dependence: Secondary | ICD-10-CM | POA: Diagnosis not present

## 2017-02-15 DIAGNOSIS — I4891 Unspecified atrial fibrillation: Secondary | ICD-10-CM | POA: Diagnosis not present

## 2017-02-15 DIAGNOSIS — R180 Malignant ascites: Secondary | ICD-10-CM

## 2017-02-15 DIAGNOSIS — R1084 Generalized abdominal pain: Secondary | ICD-10-CM | POA: Diagnosis not present

## 2017-02-15 DIAGNOSIS — E119 Type 2 diabetes mellitus without complications: Secondary | ICD-10-CM | POA: Diagnosis not present

## 2017-02-15 DIAGNOSIS — K219 Gastro-esophageal reflux disease without esophagitis: Secondary | ICD-10-CM | POA: Diagnosis not present

## 2017-02-15 DIAGNOSIS — I10 Essential (primary) hypertension: Secondary | ICD-10-CM | POA: Diagnosis not present

## 2017-02-15 MED ORDER — LIDOCAINE HCL 1 % IJ SOLN
INTRAMUSCULAR | Status: AC
  Filled 2017-02-15: qty 20

## 2017-02-15 MED ORDER — LIDOCAINE-EPINEPHRINE (PF) 2 %-1:200000 IJ SOLN
20.0000 mL | Freq: Once | INTRAMUSCULAR | Status: DC
  Filled 2017-02-15: qty 20

## 2017-02-15 NOTE — ED Provider Notes (Signed)
[place] DEPT Provider Note   CSN: [id number] Arrival date & time: 02/15/17  0052     History   Chief Complaint Chief Complaint  Patient presents with  . Requesting Paracentesis  . Shortness of Breath    HPI Natalie Lane is a 72 y.o. female.  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  She has been to the ER multiple times in the past 6 weeks for IR ultrasound-guided paracentesis.  No fevers or chills.  No nausea or vomiting.  Patient is being followed by hospice of [facility]  Past Medical History:  Diagnosis Date  . Anemia   . Ascites   . CHD (congenital heart disease)    a. 1979 s/p septal defect repair (? VSD vs ASD), old op notes not available;  b. 04/2016 Echo: EF 60-65%, no rwma, Gr2 DD, mild AS, mild TR, no PFO (prev repaired).  . Chronic diastolic CHF (congestive heart failure) ([place])    04/2016 Echo: EF 60-65%, no rwma, Gr2 DD.  Marland Kitchen Complication of anesthesia   . 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 04/2016 s/p paracentesis.  . Diabetes mellitus, type 2 ([place])   . Dyspnea   . Edema   . Gastric varices    a. s/p banding.  Marland Kitchen GERD (gastroesophageal reflux disease)   . History of blood transfusion   . Hyperlipidemia   . Normal stress echocardiogram 2008  . Pancreatic adenoma   . Paroxysmal atrial flutter ([place])    a. 11/2009 s/p RFCA.  Marland Kitchen PONV (postoperative nausea and vomiting)    with Cholecystectomy  . Tobacco abuse    a. smokes 4 cigarettes/day.    Patient Active Problem List   Diagnosis Date Noted  . Decompensation of cirrhosis of liver ([place]) 01/31/2017  . CKD (chronic kidney disease), stage IV ([place])   . Advance care planning   . Goals of care, counseling/discussion   . Palliative care by specialist   . Tobacco abuse   . PONV (postoperative nausea and vomiting)   .  Paroxysmal atrial flutter ([place])   . Pancreatic adenoma   . History of blood transfusion   . GERD (gastroesophageal reflux disease)   . Gastric varices   . Dyspnea   . Diabetes mellitus, type 2 ([place])   . 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 Complication of anesthesia   . Chronic diastolic CHF (congestive heart failure) ([place])   . Anemia   . SOB (shortness of breath)   . Hyperkalemia 09/02/2016  . Hypotension 09/02/2016  . Ascites of liver 09/02/2016  . Pressure injury of skin 08/24/2016  . ARF (acute renal failure) ([place]) 06/26/2016  . Tobacco use 06/26/2016  . Decompensated liver disease ([place]) 06/26/2016  . Acute kidney injury ([place])   . PAT (paroxysmal atrial tachycardia) ([place]) 05/12/2016  . Ascites 05/10/2016  . Decompensated hepatic cirrhosis ([place]) 05/10/2016  . Esophageal varices ([place]) 01/23/2014  . Cirrhosis of liver with ascites ([place]) 11/16/2013  . Chest pain at rest 09/23/2011  . Heart palpitations 12/03/2010  . Atrial flutter ([place])   . CHD (congenital heart disease)   . Edema   . Hyperlipidemia   . Poorly controlled type 2 diabetes mellitus with peripheral neuropathy [facility])     Past Surgical History:  Procedure Laterality Date  . ABDOMINAL HYSTERECTOMY    . ASD versus VSD repair  1979  Operative notes not available.  . ATRIAL ABLATION SURGERY  10 /2011  . [place]   x2  . CHOLECYSTECTOMY    . ESOPHAGOGASTRODUODENOSCOPY (EGD) WITH PROPOFOL N/A 11/16/2013   Procedure: ESOPHAGOGASTRODUODENOSCOPY (EGD) WITH PROPOFOL;  Surgeon: Lear Ng, MD;  Location: [facility];  Service: Endoscopy;  Laterality: N/A;  . ESOPHAGOGASTRODUODENOSCOPY (EGD) WITH PROPOFOL N/A 01/23/2014   Procedure: ESOPHAGOGASTRODUODENOSCOPY (EGD) WITH PROPOFOL;  Surgeon: Lear Ng, MD;  Location: [facility];  Service: Endoscopy;  Laterality: N/A;  . GASTRIC VARICES BANDING N/A 11/16/2013   Procedure: GASTRIC VARICES BANDING;  Surgeon: Lear Ng, MD;  Location: [facility];  Service: Endoscopy;  Laterality: N/A;  . GASTRIC VARICES BANDING N/A 01/23/2014   Procedure: GASTRIC VARICES BANDING;  Surgeon: Lear Ng, MD;  Location: [facility];  Service: Endoscopy;  Laterality: N/A;  . HERNIA REPAIR    . IR GENERIC HISTORICAL  05/11/2016   IR PARACENTESIS 05/11/2016 Ascencion Dike, PA-C [facility]-INTERV RAD  . IR PARACENTESIS  07/08/2016  . IR PARACENTESIS  08/09/2016  . IR PARACENTESIS  08/24/2016  . IR PARACENTESIS  09/03/2016  . IR PARACENTESIS  09/16/2016  . IR PARACENTESIS  09/23/2016  . IR PARACENTESIS  10/07/2016  . IR PARACENTESIS  10/14/2016  . IR PARACENTESIS  10/27/2016  . IR PARACENTESIS  11/09/2016  . IR PARACENTESIS  11/30/2016  . IR PARACENTESIS  12/06/2016  . IR PARACENTESIS  12/13/2016  . IR PARACENTESIS  12/23/2016  . IR PARACENTESIS  12/30/2016  . IR PARACENTESIS  01/12/2017  . IR PARACENTESIS  02/01/2017  . OTHER SURGICAL HISTORY     partial hysterectomy  . PARACENTESIS  09/03/2016    OB History    No data available       Home Medications    Prior to Admission medications   Medication Sig Start Date End Date Taking? Authorizing Provider  acetaminophen (TYLENOL) 325 MG tablet Take 325 mg by mouth every 6 (six) hours as needed for moderate pain.   Yes [provider]  amiodarone (PACERONE) 200 MG tablet Take 100 mg by mouth daily.   Yes [provider]  furosemide (LASIX) 80 MG tablet Take 1 tablet (80 mg total) by mouth 2 (two) times daily. 09/05/16  Yes Velvet Bathe, MD  glimepiride (AMARYL) 2 MG tablet Take 2 mg by mouth 2 (two) times daily before a meal.   Yes [provider]  HUMULIN N 100 UNIT/ML injection Inject 15-25 Units into the skin See admin instructions. 25 units in the morning before breakfast and 15 units at bedtime 12/15/16  Yes [provider]  hydroxypropyl methylcellulose / hypromellose (ISOPTO TEARS / GONIOVISC) 2.5 % ophthalmic solution Place 1 drop into both eyes 3 (three)  times daily as needed for dry eyes.   Yes [provider]  hydrOXYzine (ATARAX/VISTARIL) 25 MG tablet Take 25 mg by mouth every 6 (six) hours as needed for itching.   Yes [provider]  LORazepam (ATIVAN) 0.5 MG tablet Take 1 tablet (0.5 mg total) by mouth every 6 (six) hours as needed for anxiety. 01/21/17  Yes Charlynne Cousins, MD  ondansetron (ZOFRAN-ODT) 4 MG disintegrating tablet Take 4 mg by mouth every 8 (eight) hours as needed for nausea/vomiting. DISSOLVE IN THE MOUTH 12/07/16  Yes [provider]  prochlorperazine (COMPAZINE) 10 MG tablet Take 10 mg by mouth every 6 (six) hours as needed for nausea or vomiting.   Yes [provider]  Sennosides (SENOKOT PO)  Take 1 tablet by mouth daily.    Yes [provider]  spironolactone (ALDACTONE) 100 MG tablet Take 50 mg by mouth daily.   Yes [provider]  Morphine Sulfate (MORPHINE CONCENTRATE) 10 mg / 0.5 ml concentrated solution Take 0.5 mLs (10 mg total) by mouth every 2 (two) hours as needed for severe pain or shortness of breath. Patient not taking: Reported on 02/15/2017 01/21/17   Charlynne Cousins, MD  OXYGEN Inhale 4 L into the lungs continuous.    [provider]    Family History Family History  Problem Relation Age of Onset  . Hypertension Mother   . Kidney disease Sister   . Depression Paternal Grandfather   . Diabetes Brother     Social History Social History   Tobacco Use  . Smoking status: Former Smoker    Packs/day: 0.50    Years: 36.00    Pack years: 18.00    Types: Cigarettes    Last attempt to quit: 02/16/2016    Years since quitting: 1.0  . Smokeless tobacco: Never Used  . Tobacco comment: was smoking 4 cigs/day prior to admission.  Substance Use Topics  . Alcohol use: No  . Drug use: No     Allergies   Monosodium glutamate; Red dye; Erythromycin; Penicillins; Statins; Sulfa antibiotics; Glipizide; Iohexol; and Tape   Review of  Systems Review of Systems  All other systems reviewed and are negative.    Physical Exam Updated Vital Signs BP 114/69   Pulse 88   Temp 98.3 F (36.8 C) (Oral)   Resp 17   SpO2 99%   Physical Exam  Constitutional: She is oriented to person, place, and time. She appears well-developed and well-nourished. No distress.  HENT:  Head: Normocephalic and atraumatic.  Eyes: EOM are normal.  Neck: Normal range of motion.  Cardiovascular: Normal rate, regular rhythm and normal heart sounds.  Pulmonary/Chest: Effort normal and breath sounds normal.  Abdominal: Soft. She exhibits distension. There is no tenderness.  Large volume ascites  Musculoskeletal: Normal range of motion.  Neurological: She is alert and oriented to person, place, and time.  Skin: Skin is warm and dry.  Psychiatric: She has a normal mood and affect. Judgment normal.  Nursing note and vitals reviewed.    ED Treatments / Results  Labs (all labs ordered are listed, but only abnormal results are displayed) Labs Reviewed - No data to display  EKG  EKG Interpretation None       Radiology No results found.  Procedures Procedures (including critical care time)  Medications Ordered in ED Medications  lidocaine-EPINEPHrine (XYLOCAINE W/EPI) 2 %-1:200000 (PF) injection 20 mL (not administered)     Initial Impression / Assessment and Plan / ED Course  I have reviewed the triage vital signs and the nursing notes.  Pertinent labs & imaging results that were available during my care of the patient were reviewed by me and considered in my medical decision making (see chart for details).     Large volume ascites.  Patient will benefit from therapeutic paracentesis.  Ultrasound IR guided paracentesis ordered.  Care to Dr Eulis Foster  Final Clinical Impressions(s) / ED Diagnoses   Final diagnoses:  Cirrhosis [facility])    ED Discharge Orders    None       Jola Schmidt, MD 02/15/17 [phone]

## 2017-02-15 NOTE — Procedures (Signed)
PROCEDURE SUMMARY:  Successful US guided paracentesis from the anterior abdomen.  Yielded 8 liters of cloudy light yellow fluid.  No immediate complications.  Pt tolerated well.    Angeletta Goelz S Carter Kaman PA-C 02/15/2017 1:19 PM

## 2017-02-15 NOTE — ED Notes (Signed)
Report given to oncoming RN.

## 2017-02-15 NOTE — ED Notes (Signed)
Pt has arrived to the TCU Rm 29, smiling and denies pain/distress.  Pt has been situated into the room/bed and placed on 3L O2 via [place].  Call bell is in reach.

## 2017-02-15 NOTE — ED Notes (Signed)
ED Provider at bedside.  Pt easily awoken and in no distress.  EDP aware of Vitals and asking for pt to be stood up and make sure she is able to stand/walk w/o distress and good BP.

## 2017-02-15 NOTE — ED Notes (Signed)
[facility] has arrived but once they realized this is a [facility] patient they needed to call dispatch because [facility] is now under contract to transport [facility] patients.  Awaiting [facility] to arrive for transport of pt to home.  Pt's son is at bedside helping feed pt her dinner.  Pt is free of distress.

## 2017-02-15 NOTE — ED Provider Notes (Signed)
15: 10-she has now returned from radiology, where she had paracentesis for volume removal, 8 L.  Review of recent hospitalization discharge summary indicates transition to the OR for placement of Pleurx chronic drainage catheter however that has not been done yet.  Patient is under hospice care.  At this time the patient is alert and comfortable.  Her GI physician is managing her cirrhosis and ascites, and that hospice is coming in to help her.  She recalls that there have been discussions about instilling a Pleurx catheter, but that has not yet been arranged.  Plan-have her contact her PCP, and/or GI doctor, in the morning for arrangements to have the ascites treated either with biweekly drainage procedures or a chronic catheter for slow drainage.  The latter would be preferable, as indicated in the discharge summary, from 02/02/17.   Daleen Bo, MD 02/16/17 [phone]

## 2017-02-15 NOTE — ED Notes (Signed)
[facility] called for transport but no ETA is able to be given.

## 2017-02-15 NOTE — ED Triage Notes (Signed)
Pt presents with c/o shortness of breath, a rigid and distended abdomen/torso consistent with ascites. 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

## 2017-02-15 NOTE — ED Notes (Signed)
Pt's boyfriend/caregiver has called to check up on the pt.  He states she is unable to be taken home by POV and needs an ambulance to bring her into the house.  Dr. Eulis Foster has been made aware and has ok'd [facility] to transport home.

## 2017-02-15 NOTE — ED Notes (Signed)
Pt transported to Ultrasound.  

## 2017-02-15 NOTE — ED Notes (Signed)
Bed: WA29 Expected date:  Expected time:  Means of arrival:  Comments: Hold for room 4

## 2017-02-15 NOTE — Discharge Instructions (Signed)
It appears that you will need drainage of ascites fluid at least twice a week, unless a catheter is put in for permanent drainage.  This can be arranged by your GI doctor.  Please call him in the morning for assistance with this, if he is comfortable proceeding with that plan.

## 2017-02-15 NOTE — ED Notes (Signed)
Bed: TY75 Expected date: 02/15/17 Expected time: 12:51 AM Means of arrival:  Comments:

## 2017-02-15 NOTE — ED Notes (Signed)
Call placed to EMS dispatch has confirmed that a unit will be coming to get pt for transport to home.

## 2017-02-17 ENCOUNTER — Other Ambulatory Visit: Payer: Self-pay

## 2017-02-17 ENCOUNTER — Encounter (HOSPITAL_COMMUNITY): Payer: Self-pay | Admitting: Emergency Medicine

## 2017-02-17 ENCOUNTER — Other Ambulatory Visit: Payer: Self-pay | Admitting: Radiology

## 2017-02-17 ENCOUNTER — Observation Stay (HOSPITAL_COMMUNITY)
Admission: EM | Admit: 2017-02-17 | Discharge: 2017-02-18 | Disposition: A | Discharge status: Home / Self Care | Payer: Medicare Other | Attending: Family Medicine | Admitting: Family Medicine

## 2017-02-17 DIAGNOSIS — Z794 Long term (current) use of insulin: Secondary | ICD-10-CM | POA: Insufficient documentation

## 2017-02-17 DIAGNOSIS — R109 Unspecified abdominal pain: Secondary | ICD-10-CM | POA: Insufficient documentation

## 2017-02-17 DIAGNOSIS — N184 Chronic kidney disease, stage 4 (severe): Secondary | ICD-10-CM | POA: Diagnosis not present

## 2017-02-17 DIAGNOSIS — I129 Hypertensive chronic kidney disease with stage 1 through stage 4 chronic kidney disease, or unspecified chronic kidney disease: Secondary | ICD-10-CM | POA: Diagnosis not present

## 2017-02-17 DIAGNOSIS — E1140 Type 2 diabetes mellitus with diabetic neuropathy, unspecified: Secondary | ICD-10-CM | POA: Insufficient documentation

## 2017-02-17 DIAGNOSIS — R188 Other ascites: Secondary | ICD-10-CM | POA: Diagnosis not present

## 2017-02-17 DIAGNOSIS — Z9981 Dependence on supplemental oxygen: Secondary | ICD-10-CM | POA: Insufficient documentation

## 2017-02-17 DIAGNOSIS — Z79899 Other long term (current) drug therapy: Secondary | ICD-10-CM | POA: Diagnosis not present

## 2017-02-17 DIAGNOSIS — Y69 Unspecified misadventure during surgical and medical care: Secondary | ICD-10-CM | POA: Diagnosis not present

## 2017-02-17 DIAGNOSIS — R0602 Shortness of breath: Secondary | ICD-10-CM | POA: Insufficient documentation

## 2017-02-17 DIAGNOSIS — T8189XA Other complications of procedures, not elsewhere classified, initial encounter: Secondary | ICD-10-CM | POA: Diagnosis not present

## 2017-02-17 DIAGNOSIS — Z87891 Personal history of nicotine dependence: Secondary | ICD-10-CM | POA: Insufficient documentation

## 2017-02-17 DIAGNOSIS — T8131XA Disruption of external operation (surgical) wound, not elsewhere classified, initial encounter: Secondary | ICD-10-CM | POA: Diagnosis not present

## 2017-02-17 DIAGNOSIS — R180 Malignant ascites: Secondary | ICD-10-CM

## 2017-02-17 DIAGNOSIS — I5032 Chronic diastolic (congestive) heart failure: Secondary | ICD-10-CM | POA: Insufficient documentation

## 2017-02-17 LAB — COMPREHENSIVE METABOLIC PANEL
ALK PHOS: 80 U/L (ref 38–126)
ALT: 17 U/L (ref 14–54)
ANION GAP: 7 (ref 5–15)
AST: 21 U/L (ref 15–41)
Albumin: 2.7 g/dL — ABNORMAL LOW (ref 3.5–5.0)
BILIRUBIN TOTAL: 1 mg/dL (ref 0.3–1.2)
BUN: 61 mg/dL — ABNORMAL HIGH (ref 6–20)
CALCIUM: 8.2 mg/dL — AB (ref 8.9–10.3)
CO2: 21 mmol/L — ABNORMAL LOW (ref 22–32)
Chloride: 100 mmol/L — ABNORMAL LOW (ref 101–111)
Creatinine, Ser: 2.17 mg/dL — ABNORMAL HIGH (ref 0.44–1.00)
GFR calc non Af Amer: 22 mL/min — ABNORMAL LOW (ref >60)
GFR, EST AFRICAN AMERICAN: 25 mL/min — AB (ref >60)
GLUCOSE: 345 mg/dL — AB (ref 65–99)
Potassium: 4.8 mmol/L (ref 3.5–5.1)
Sodium: 128 mmol/L — ABNORMAL LOW (ref 135–145)
Total Protein: 5.7 g/dL — ABNORMAL LOW (ref 6.5–8.1)

## 2017-02-17 LAB — CBC WITH DIFFERENTIAL/PLATELET
BASOS PCT: 0 %
Basophils Absolute: 0 10*3/uL (ref 0.0–0.1)
Eosinophils Absolute: 0.3 10*3/uL (ref 0.0–0.7)
Eosinophils Relative: 4 %
HEMATOCRIT: 32.3 % — AB (ref 36.0–46.0)
HEMOGLOBIN: 10.6 g/dL — AB (ref 12.0–15.0)
LYMPHS PCT: 18 %
Lymphs Abs: 1.2 10*3/uL (ref 0.7–4.0)
MCH: 29.2 pg (ref 26.0–34.0)
MCHC: 32.8 g/dL (ref 30.0–36.0)
MCV: 89 fL (ref 78.0–100.0)
MONOS PCT: 5 %
Monocytes Absolute: 0.3 10*3/uL (ref 0.1–1.0)
NEUTROS ABS: 5 10*3/uL (ref 1.7–7.7)
Neutrophils Relative %: 73 %
Platelets: 284 10*3/uL (ref 150–400)
RBC: 3.63 MIL/uL — ABNORMAL LOW (ref 3.87–5.11)
RDW: 13.4 % (ref 11.5–15.5)
WBC: 6.8 10*3/uL (ref 4.0–10.5)

## 2017-02-17 LAB — GLUCOSE, CAPILLARY: Glucose-Capillary: 360 mg/dL — ABNORMAL HIGH (ref 65–99)

## 2017-02-17 LAB — PROTIME-INR
INR: 1.18
Prothrombin Time: 14.9 seconds (ref 11.4–15.2)

## 2017-02-17 MED ORDER — AMIODARONE HCL 200 MG PO TABS
100.0000 mg | ORAL_TABLET | Freq: Every day | ORAL | Status: DC
  Administered 2017-02-17 – 2017-02-18 (×2): 100 mg via ORAL
  Filled 2017-02-17 (×2): qty 1

## 2017-02-17 MED ORDER — GLIMEPIRIDE 2 MG PO TABS
2.0000 mg | ORAL_TABLET | Freq: Two times a day (BID) | ORAL | Status: DC
  Administered 2017-02-17: 2 mg via ORAL
  Filled 2017-02-17: qty 1

## 2017-02-17 MED ORDER — INSULIN NPH (HUMAN) (ISOPHANE) 100 UNIT/ML ~~LOC~~ SUSP
25.0000 [IU] | Freq: Every day | SUBCUTANEOUS | Status: DC
  Administered 2017-02-18: 25 [IU] via SUBCUTANEOUS

## 2017-02-17 MED ORDER — FAMOTIDINE 20 MG PO TABS
20.0000 mg | ORAL_TABLET | Freq: Every day | ORAL | Status: DC
  Administered 2017-02-17 – 2017-02-18 (×2): 20 mg via ORAL
  Filled 2017-02-17 (×2): qty 1

## 2017-02-17 MED ORDER — HYPROMELLOSE (GONIOSCOPIC) 2.5 % OP SOLN: 1.0000 [drp] | Freq: Three times a day (TID) | OPHTHALMIC | Status: DC | PRN

## 2017-02-17 MED ORDER — INSULIN ASPART 100 UNIT/ML ~~LOC~~ SOLN
0.0000 [IU] | Freq: Three times a day (TID) | SUBCUTANEOUS | Status: DC
  Administered 2017-02-18: 3 [IU] via SUBCUTANEOUS
  Administered 2017-02-18: 1 [IU] via SUBCUTANEOUS

## 2017-02-17 MED ORDER — FENTANYL CITRATE (PF) 100 MCG/2ML IJ SOLN
50.0000 ug | Freq: Once | INTRAMUSCULAR | Status: AC
  Administered 2017-02-17: 50 ug via INTRAVENOUS
  Filled 2017-02-17: qty 2

## 2017-02-17 MED ORDER — VANCOMYCIN HCL IN DEXTROSE 1-5 GM/200ML-% IV SOLN
1000.0000 mg | Freq: Once | INTRAVENOUS | Status: AC
  Administered 2017-02-18: 1000 mg via INTRAVENOUS

## 2017-02-17 MED ORDER — HYDROXYZINE HCL 25 MG PO TABS: 25.0000 mg | ORAL_TABLET | Freq: Four times a day (QID) | ORAL | Status: DC | PRN

## 2017-02-17 MED ORDER — FUROSEMIDE 40 MG PO TABS
80.0000 mg | ORAL_TABLET | Freq: Two times a day (BID) | ORAL | Status: DC
  Administered 2017-02-17 – 2017-02-18 (×2): 80 mg via ORAL
  Filled 2017-02-17 (×2): qty 2

## 2017-02-17 MED ORDER — SENNA 8.6 MG PO TABS
1.0000 | ORAL_TABLET | Freq: Two times a day (BID) | ORAL | Status: DC
  Administered 2017-02-17 – 2017-02-18 (×2): 8.6 mg via ORAL
  Filled 2017-02-17 (×2): qty 1

## 2017-02-17 MED ORDER — SPIRONOLACTONE 25 MG PO TABS
50.0000 mg | ORAL_TABLET | Freq: Every day | ORAL | Status: DC
  Administered 2017-02-17 – 2017-02-18 (×2): 50 mg via ORAL
  Filled 2017-02-17 (×2): qty 2

## 2017-02-17 MED ORDER — LORAZEPAM 0.5 MG PO TABS: 0.5000 mg | ORAL_TABLET | Freq: Four times a day (QID) | ORAL | Status: DC | PRN

## 2017-02-17 MED ORDER — POLYVINYL ALCOHOL 1.4 % OP SOLN: 1.0000 [drp] | OPHTHALMIC | Status: DC | PRN

## 2017-02-17 MED ORDER — EQL EAR DROPS OT SOLN: 5.0000 [drp] | Freq: Every day | OTIC | Status: DC

## 2017-02-17 MED ORDER — ACETAMINOPHEN 325 MG PO TABS
325.0000 mg | ORAL_TABLET | Freq: Four times a day (QID) | ORAL | Status: DC | PRN
  Administered 2017-02-17 – 2017-02-18 (×2): 325 mg via ORAL
  Filled 2017-02-17 (×2): qty 1

## 2017-02-17 MED ORDER — ONDANSETRON 4 MG PO TBDP: 4.0000 mg | ORAL_TABLET | Freq: Three times a day (TID) | ORAL | Status: DC | PRN

## 2017-02-17 MED ORDER — PROCHLORPERAZINE MALEATE 10 MG PO TABS: 10.0000 mg | ORAL_TABLET | Freq: Four times a day (QID) | ORAL | Status: DC | PRN

## 2017-02-17 MED ORDER — INSULIN NPH (HUMAN) (ISOPHANE) 100 UNIT/ML ~~LOC~~ SUSP
15.0000 [IU] | Freq: Every day | SUBCUTANEOUS | Status: DC
  Administered 2017-02-17: 15 [IU] via SUBCUTANEOUS
  Filled 2017-02-17: qty 10

## 2017-02-17 NOTE — ED Provider Notes (Signed)
[facility] DEPT Provider Note   CSN: [id number] Arrival date & time: 02/17/17  0931     History   Chief Complaint Chief Complaint  Patient presents with  . Wound Dehiscence    HPI Natalie Lane is a 72 y.o. female.  HPI   72 year old female with past medical history as below here with leakage from paracentesis site.  The patient states that she underwent paracentesis on 1/1.  This was performed by interventional radiology.  She felt improved after that.  Since then, she has had worsening abdominal distention.  Overnight, she began leaking cloudy fluid from her previous paracentesis site.  She has had associated mild nausea.  She endorses generalized abdominal pain.  No vomiting.  No diarrhea.  No fevers.  Patient is with hospice and has nurse care at home but was sent here due to the amount of drainage.  Per review of records, she had been referred for Pleurx catheter placement, but this has not been placed at this time despite recommendations from the hospitalist team.  Patient denies any known fevers.  Denies any worsening of her chronic shortness of breath.  She does she feel like she would benefit from recurrent drainage.  Past Medical History:  Diagnosis Date  . Anemia   . Ascites   . CHD (congenital heart disease)    a. 1979 s/p septal defect repair (? VSD vs ASD), old op notes not available;  b. 04/2016 Echo: EF 60-65%, no rwma, Gr2 DD, mild AS, mild TR, no PFO (prev repaired).  . Chronic diastolic CHF (congestive heart failure) ([place])    04/2016 Echo: EF 60-65%, no rwma, Gr2 DD.  Marland Kitchen Complication of anesthesia   . 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 04/2016 s/p paracentesis.  . Diabetes mellitus, type 2 ([place])   . Dyspnea   . Edema   . Gastric varices    a. s/p banding.  Marland Kitchen GERD (gastroesophageal reflux disease)   . History of blood transfusion   . Hyperlipidemia   . Normal stress echocardiogram 2008  . Pancreatic adenoma   . Paroxysmal atrial  flutter ([place])    a. 11/2009 s/p RFCA.  Marland Kitchen PONV (postoperative nausea and vomiting)    with Cholecystectomy  . Tobacco abuse    a. smokes 4 cigarettes/day.    Patient Active Problem List   Diagnosis Date Noted  . Decompensation of cirrhosis of liver ([place]) 01/31/2017  . CKD (chronic kidney disease), stage IV ([place])   . Advance care planning   . Goals of care, counseling/discussion   . Palliative care by specialist   . Tobacco abuse   . PONV (postoperative nausea and vomiting)   . Paroxysmal atrial flutter ([place])   . Pancreatic adenoma   . History of blood transfusion   . GERD (gastroesophageal reflux disease)   . Gastric varices   . Dyspnea   . Diabetes mellitus, type 2 ([place])   . 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 Complication of anesthesia   . Chronic diastolic CHF (congestive heart failure) ([place])   . Anemia   . SOB (shortness of breath)   . Hyperkalemia 09/02/2016  . Hypotension 09/02/2016  . Ascites of liver 09/02/2016  . Pressure injury of skin 08/24/2016  . ARF (acute renal failure) ([place]) 06/26/2016  . Tobacco use 06/26/2016  . Decompensated liver disease ([place]) 06/26/2016  . Acute kidney injury ([place])   . PAT (paroxysmal atrial tachycardia) ([place]) 05/12/2016  . Ascites 05/10/2016  . Decompensated  hepatic cirrhosis ([place]) 05/10/2016  . Esophageal varices ([place]) 01/23/2014  . Cirrhosis of liver with ascites ([place]) 11/16/2013  . Chest pain at rest 09/23/2011  . Heart palpitations 12/03/2010  . Atrial flutter ([place])   . CHD (congenital heart disease)   . Edema   . Hyperlipidemia   . Poorly controlled type 2 diabetes mellitus with peripheral neuropathy [facility])     Past Surgical History:  Procedure Laterality Date  . ABDOMINAL HYSTERECTOMY    . ASD versus VSD repair  1979   Operative notes not available.  . ATRIAL ABLATION SURGERY  10 /2011  . [place]   x2  . CHOLECYSTECTOMY    . ESOPHAGOGASTRODUODENOSCOPY (EGD) WITH PROPOFOL N/A 11/16/2013   Procedure:  ESOPHAGOGASTRODUODENOSCOPY (EGD) WITH PROPOFOL;  Surgeon: Lear Ng, MD;  Location: [facility];  Service: Endoscopy;  Laterality: N/A;  . ESOPHAGOGASTRODUODENOSCOPY (EGD) WITH PROPOFOL N/A 01/23/2014   Procedure: ESOPHAGOGASTRODUODENOSCOPY (EGD) WITH PROPOFOL;  Surgeon: Lear Ng, MD;  Location: [facility];  Service: Endoscopy;  Laterality: N/A;  . GASTRIC VARICES BANDING N/A 11/16/2013   Procedure: GASTRIC VARICES BANDING;  Surgeon: Lear Ng, MD;  Location: [facility];  Service: Endoscopy;  Laterality: N/A;  . GASTRIC VARICES BANDING N/A 01/23/2014   Procedure: GASTRIC VARICES BANDING;  Surgeon: Lear Ng, MD;  Location: [facility];  Service: Endoscopy;  Laterality: N/A;  . HERNIA REPAIR    . IR GENERIC HISTORICAL  05/11/2016   IR PARACENTESIS 05/11/2016 Ascencion Dike, PA-C [facility]-INTERV RAD  . IR PARACENTESIS  07/08/2016  . IR PARACENTESIS  08/09/2016  . IR PARACENTESIS  08/24/2016  . IR PARACENTESIS  09/03/2016  . IR PARACENTESIS  09/16/2016  . IR PARACENTESIS  09/23/2016  . IR PARACENTESIS  10/07/2016  . IR PARACENTESIS  10/14/2016  . IR PARACENTESIS  10/27/2016  . IR PARACENTESIS  11/09/2016  . IR PARACENTESIS  11/30/2016  . IR PARACENTESIS  12/06/2016  . IR PARACENTESIS  12/13/2016  . IR PARACENTESIS  12/23/2016  . IR PARACENTESIS  12/30/2016  . IR PARACENTESIS  01/12/2017  . IR PARACENTESIS  02/01/2017  . OTHER SURGICAL HISTORY     partial hysterectomy  . PARACENTESIS  09/03/2016    OB History    No data available       Home Medications    Prior to Admission medications   Medication Sig Start Date End Date Taking? Authorizing Provider  acetaminophen (TYLENOL) 325 MG tablet Take 325 mg by mouth every 6 (six) hours as needed for moderate pain.    [provider]  amiodarone (PACERONE) 200 MG tablet Take 100 mg by mouth daily.    [provider]  furosemide (LASIX) 80 MG tablet Take 1 tablet (80 mg total) by mouth 2 (two)  times daily. 09/05/16   Velvet Bathe, MD  glimepiride (AMARYL) 2 MG tablet Take 2 mg by mouth 2 (two) times daily before a meal.    [provider]  HUMULIN N 100 UNIT/ML injection Inject 15-25 Units into the skin See admin instructions. 25 units in the morning before breakfast and 15 units at bedtime 12/15/16   [provider]  hydroxypropyl methylcellulose / hypromellose (ISOPTO TEARS / GONIOVISC) 2.5 % ophthalmic solution Place 1 drop into both eyes 3 (three) times daily as needed for dry eyes.    [provider]  hydrOXYzine (ATARAX/VISTARIL) 25 MG tablet Take 25 mg by mouth every 6 (six) hours as needed for itching.    [provider]  LORazepam (ATIVAN) 0.5 MG tablet Take 1 tablet (0.5 mg total) by mouth every 6 (six) hours as needed for anxiety. 01/21/17   Charlynne Cousins, MD  Morphine Sulfate (MORPHINE CONCENTRATE) 10 mg / 0.5 ml concentrated solution Take 0.5 mLs (10 mg total) by mouth every 2 (two) hours as needed for severe pain or shortness of breath. Patient not taking: Reported on 02/15/2017 01/21/17   Charlynne Cousins, MD  ondansetron (ZOFRAN-ODT) 4 MG disintegrating tablet Take 4 mg by mouth every 8 (eight) hours as needed for nausea/vomiting. DISSOLVE IN THE MOUTH 12/07/16   [provider]  OXYGEN Inhale 4 L into the lungs continuous.    [provider]  prochlorperazine (COMPAZINE) 10 MG tablet Take 10 mg by mouth every 6 (six) hours as needed for nausea or vomiting.    [provider]  Sennosides (SENOKOT PO) Take 1 tablet by mouth daily.     [provider]  spironolactone (ALDACTONE) 100 MG tablet Take 50 mg by mouth daily.    [provider]    Family History Family History  Problem Relation Age of Onset  . Hypertension Mother   . Kidney disease Sister   . Depression Paternal Grandfather   . Diabetes Brother     Social History Social History   Tobacco Use  . Smoking status: Former  Smoker    Packs/day: 0.50    Years: 36.00    Pack years: 18.00    Types: Cigarettes    Last attempt to quit: 02/16/2016    Years since quitting: 1.0  . Smokeless tobacco: Never Used  . Tobacco comment: was smoking 4 cigs/day prior to admission.  Substance Use Topics  . Alcohol use: No  . Drug use: No     Allergies   Monosodium glutamate; Red dye; Erythromycin; Penicillins; Statins; Sulfa antibiotics; Glipizide; Iohexol; and Tape   Review of Systems Review of Systems  Constitutional: Positive for fatigue.  Gastrointestinal: Positive for abdominal distention and abdominal pain.  Skin: Positive for wound.  All other systems reviewed and are negative.    Physical Exam Updated Vital Signs BP 111/66 (BP Location: Left Arm)   Pulse 88   Temp 97.7 F (36.5 C) (Oral)   Resp 20   SpO2 100%   Physical Exam  Constitutional: She is oriented to person, place, and time. She appears well-developed and well-nourished. No distress.  Chronically ill-appearing  HENT:  Head: Normocephalic and atraumatic.  Eyes: Conjunctivae are normal.  Neck: Neck supple.  Cardiovascular: Normal rate, regular rhythm and normal heart sounds. Exam reveals no friction rub.  No murmur heard. Pulmonary/Chest: Effort normal and breath sounds normal. No respiratory distress. She has no wheezes. She has no rales.  Abdominal: Soft. She exhibits no distension.  Midline paracentesis site with easily expressed cloudy drainage.  No surrounding erythema.  Mild diffuse tenderness of the distended abdomen.  Musculoskeletal: She exhibits no edema.  Neurological: She is alert and oriented to person, place, and time. She exhibits normal muscle tone.  Skin: Skin is warm. Capillary refill takes less than 2 seconds.  Psychiatric: She has a normal mood and affect.  Nursing note and vitals reviewed.    ED Treatments / Results  Labs (all labs ordered are listed, but only abnormal results are displayed) Labs Reviewed    CBC WITH DIFFERENTIAL/PLATELET  COMPREHENSIVE METABOLIC PANEL  PROTIME-INR    EKG  EKG Interpretation None       Radiology US Paracentesis  Result  Date: 02/15/2017 INDICATION: NASH cirrhosis. Recurrent ascites. Request for therapeutic paracentesis. EXAM: ULTRASOUND GUIDED ANTERIOR ABDOMEN PARACENTESIS MEDICATIONS: 1% Lidocaine = 10 mL COMPLICATIONS: None immediate. PROCEDURE: Informed written consent was obtained from the patient after a discussion of the risks, benefits and alternatives to treatment. A timeout was performed prior to the initiation of the procedure. Initial ultrasound scanning demonstrates a large amount of ascites within the abdomen. The abdomen was prepped and draped in the usual sterile fashion. 1% lidocaine with epinephrine was used for local anesthesia. Following this, a 19 gauge, 10-cm, Yueh catheter was introduced. An ultrasound image was saved for documentation purposes. The paracentesis was performed. The catheter was removed and a dressing was applied. The patient tolerated the procedure well without immediate post procedural complication. FINDINGS: A total of approximately 8 liters of cloudy light yellow fluid was removed. IMPRESSION: Successful ultrasound-guided paracentesis yielding 8 liters of peritoneal fluid. Read by:  Gareth Eagle, PA-C Electronically Signed   By: Corrie Mckusick D.O.   On: 02/15/2017 13:19    Procedures Procedures (including critical care time)  Medications Ordered in ED Medications - No data to display   Initial Impression / Assessment and Plan / ED Course  I have reviewed the triage vital signs and the nursing notes.  Pertinent labs & imaging results that were available during my care of the patient were reviewed by me and considered in my medical decision making (see chart for details).     2-year-old female here with worsening abdominal distention and shortness of breath.  The patient is currently in hospice for end-stage  cirrhosis.  She is not a transplant candidate.  She is also here because she is leaking from her recent paracentesis site.  I consulted interventional radiology.  They have addressed with the wound.  She will need another therapeutic paracentesis.  However, I had a long discussion with interventional radiology.  The patient is an ideal candidate for a drain placement.  I have consulted hospice as well as the patient's PCP.  They are in agreement.  However, the patient will likely need to be briefly taken off hospice, placed under Medicaid for placement, then return to hospice.  Given that this is her third ER visit in just the last week first similar symptoms, I feel is in her best interest to admit her for drain placement.  Otherwise, labs at baseline. No leukocytosis, baseline bili and renal function - doubt SP.  Discussed with [facility] primary RN, Laurelyn Sickle [phone].  Final Clinical Impressions(s) / ED Diagnoses   Final diagnoses:  None    ED Discharge Orders    None       Duffy Bruce, MD 02/17/17 1930

## 2017-02-17 NOTE — Progress Notes (Addendum)
CSW received call from RN stating patient potential needs to revoke hospice care for further medical treatment. CSW spoke with [facility] RN, [phone], to determine if patient had been provided options regarding revoking hospice care/ patient goals. CSW was informed that the hospice social worker was en-route to hospital to speak with patient.   Kingsley Spittle, [facility] Emergency Room Clinical Social Worker [phone]

## 2017-02-17 NOTE — Progress Notes (Signed)
Home Care SW met with Patient, no other family at bedside.  SW talked to Patient about getting the drain put in and how that is not a part of her Hospice plan of care.  She voiced understanding and stated she wanted the drain.  She stated she felt she needed the drain and will revoke Hospice while she is getting the procedure done.  She understands she can be referred again once she is home.  She signed the Revocation  Form and the Discharge Instruction sheet.  SW spoke to CSW and let her know Patient's wishes and SW spoke to the Doctor during visit.  [facility] Liaison, Audrea Muscat, also know of Patietn's wishes.  Cecilio Asper, [place] [facility] [place]

## 2017-02-17 NOTE — H&P (Addendum)
History and Physical    Natalie Lane DZH:[id number] DOB: 08-Feb-1946 DOA: 02/17/2017  Referring MD/NP/PA: Voncille Lo  PCP: Carol Ada, MD   Patient coming from: home  Chief Complaint: recurrent ascites and leakage from the paracentesis paracentesis site   HPI: Natalie Lane is a 72 y.o. female with known HTN, HLD, DM, severe idiopathic liver cirrhosis, requiring recurrent large volume paracentesis, last one done on January 1st and 8 L fluid removed. Patient reports feeling better at first but started to notice leakage at the site of the injection. She explains worsening abd distension and associated exertional dyspnea that she says its due to worsening fluid. Patient also reports poor oral intake and nausea. Pt is under hospice care.   ED Course: Pt is hemodynamically stable, plan to admit by [facility] team and proceed with drain placement in AM by IR.   Review of Systems:  Constitutional: Negative for fever, chills, diaphoresis HENT: Negative for ear pain, nosebleeds, congestion, facial swelling, rhinorrhea, neck pain, neck stiffness and ear discharge.   Eyes: Negative for pain, discharge, redness, itching and visual disturbance.  Respiratory: Negative for cough, choking, wheezing and stridor.   Cardiovascular: Negative for chest pain, palpitations Gastrointestinal: Positive for abdominal distention.  Genitourinary: Negative for dysuria, urgency, frequency, hematuria, flank pain, decreased urine volume, difficulty urinating and dyspareunia.  Musculoskeletal: Negative for back pain, joint swelling, arthralgias Neurological: Negative for dizziness, tremors, seizures, syncope, facial asymmetry, speech difficulty Hematological: Negative for adenopathy. Does not bruise/bleed easily.  Psychiatric/Behavioral: Negative for hallucinations, behavioral problems, confusion  Past Medical History:  Diagnosis Date  . Anemia   . Ascites   . CHD (congenital heart disease)    a. 1979 s/p septal  defect repair (? VSD vs ASD), old op notes not available;  b. 04/2016 Echo: EF 60-65%, no rwma, Gr2 DD, mild AS, mild TR, no PFO (prev repaired).  . Chronic diastolic CHF (congestive heart failure) ([place])    04/2016 Echo: EF 60-65%, no rwma, Gr2 DD.  Marland Kitchen Complication of anesthesia   . 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 04/2016 s/p paracentesis.  . Diabetes mellitus, type 2 ([place])   . Dyspnea   . Edema   . Gastric varices    a. s/p banding.  Marland Kitchen GERD (gastroesophageal reflux disease)   . History of blood transfusion   . Hyperlipidemia   . Normal stress echocardiogram 2008  . Pancreatic adenoma   . Paroxysmal atrial flutter ([place])    a. 11/2009 s/p RFCA.  Marland Kitchen PONV (postoperative nausea and vomiting)    with Cholecystectomy  . Tobacco abuse    a. smokes 4 cigarettes/day.    Past Surgical History:  Procedure Laterality Date  . ABDOMINAL HYSTERECTOMY    . ASD versus VSD repair  1979   Operative notes not available.  . ATRIAL ABLATION SURGERY  10 /2011  . [place]   x2  . CHOLECYSTECTOMY    . ESOPHAGOGASTRODUODENOSCOPY (EGD) WITH PROPOFOL N/A 11/16/2013   Procedure: ESOPHAGOGASTRODUODENOSCOPY (EGD) WITH PROPOFOL;  Surgeon: Lear Ng, MD;  Location: [place];  Service: Endoscopy;  Laterality: N/A;  . ESOPHAGOGASTRODUODENOSCOPY (EGD) WITH PROPOFOL N/A 01/23/2014   Procedure: ESOPHAGOGASTRODUODENOSCOPY (EGD) WITH PROPOFOL;  Surgeon: Lear Ng, MD;  Location: [place];  Service: Endoscopy;  Laterality: N/A;  . GASTRIC VARICES BANDING N/A 11/16/2013   Procedure: GASTRIC VARICES BANDING;  Surgeon: Lear Ng, MD;  Location: [place];  Service: Endoscopy;  Laterality: N/A;  . GASTRIC VARICES BANDING  N/A 01/23/2014   Procedure: GASTRIC VARICES BANDING;  Surgeon: Lear Ng, MD;  Location: [place];  Service: Endoscopy;  Laterality: N/A;  . HERNIA REPAIR    . IR GENERIC HISTORICAL  05/11/2016   IR PARACENTESIS 05/11/2016 Ascencion Dike, PA-C  [facility]INTER[facility]  . IR PARACENTESIS  07/08/2016  . IR PARACENTESIS  08/09/2016  . IR PARACENTESIS  08/24/2016  . IR PARACENTESIS  09/03/2016  . IR PARACENTESIS  09/16/2016  . IR PARACENTESIS  09/23/2016  . IR PARACENTESIS  10/07/2016  . IR PARACENTESIS  10/14/2016  . IR PARACENTESIS  10/27/2016  . IR PARACENTESIS  11/09/2016  . IR PARACENTESIS  11/30/2016  . IR PARACENTESIS  12/06/2016  . IR PARACENTESIS  12/13/2016  . IR PARACENTESIS  12/23/2016  . IR PARACENTESIS  12/30/2016  . IR PARACENTESIS  01/12/2017  . IR PARACENTESIS  02/01/2017  . OTHER SURGICAL HISTORY     partial hysterectomy  . PARACENTESIS  09/03/2016   Social Hx:  reports that she quit smoking about a year ago. Her smoking use included cigarettes. She has a 18.00 pack-year smoking history. she has never used smokeless tobacco. She reports that she does not drink alcohol or use drugs.  Allergies  Allergen Reactions  . Monosodium Glutamate Anaphylaxis    MSG  . Red Dye Anaphylaxis  . Erythromycin Other (See Comments)    Reaction:  Migraine   . Penicillins Other (See Comments)    Reaction:  Migraine  Has patient had a PCN reaction causing immediate rash, facial/tongue/throat swelling, SOB or lightheadedness with hypotension: No Has patient had a PCN reaction causing severe rash involving mucus membranes or skin necrosis: No Has patient had a PCN reaction that required hospitalization No Has patient had a PCN reaction occurring within the last 10 years: No If all of the above answers are "NO", then may proceed with Cephalosporin use.  . Statins Other (See Comments)    Reaction:  Severe leg cramps   . Sulfa Antibiotics Hives  . Glipizide Rash  . Iohexol Rash  . Tape Rash    MEDICAL "PLASTIC" TAPE CAUSES BLISTERS!!! PLEASE USE AN ALTERNATIVE    Family History  Problem Relation Age of Onset  . Hypertension Mother   . Kidney disease Sister   . Depression Paternal Grandfather   . Diabetes Brother     Prior to Admission  medications   Medication Sig Start Date End Date Taking? Authorizing Provider  acetaminophen (TYLENOL) 325 MG tablet Take 325 mg by mouth every 6 (six) hours as needed for moderate pain.   Yes [provider]  amiodarone (PACERONE) 200 MG tablet Take 100 mg by mouth daily.   Yes [provider]  furosemide (LASIX) 80 MG tablet Take 1 tablet (80 mg total) by mouth 2 (two) times daily. 09/05/16  Yes Velvet Bathe, MD  glimepiride (AMARYL) 2 MG tablet Take 2 mg by mouth 2 (two) times daily before a meal.   Yes [provider]  Homeopathic Products (EQL EAR DROPS) SOLN Place 5 drops into the right ear daily.   Yes [provider]  HUMULIN N 100 UNIT/ML injection Inject 15-25 Units into the skin See admin instructions. 25 units in the morning before breakfast and 15 units at bedtime 12/15/16  Yes [provider]  hydroxypropyl methylcellulose / hypromellose (ISOPTO TEARS / GONIOVISC) 2.5 % ophthalmic solution Place 1 drop into both eyes 3 (three) times daily as needed for dry eyes.   Yes [provider]  hydrOXYzine (ATARAX/VISTARIL) 25 MG tablet Take 25 mg by mouth every 6 (six) hours as needed for itching.   Yes [provider]  LORazepam (ATIVAN) 0.5 MG tablet Take 1 tablet (0.5 mg total) by mouth every 6 (six) hours as needed for anxiety. 01/21/17  Yes Charlynne Cousins, MD  ondansetron (ZOFRAN-ODT) 4 MG disintegrating tablet Take 4 mg by mouth every 8 (eight) hours as needed for nausea/vomiting. DISSOLVE IN THE MOUTH 12/07/16  Yes [provider]  OXYGEN Inhale 4 L into the lungs continuous.   Yes [provider]  prochlorperazine (COMPAZINE) 10 MG tablet Take 10 mg by mouth every 6 (six) hours as needed for nausea or vomiting.   Yes [provider]  ranitidine (ZANTAC) 150 MG tablet Take 150 mg by mouth daily. 02/16/17  Yes [provider]  Sennosides (SENOKOT PO) Take 1 tablet by mouth daily.    Yes  [provider]  spironolactone (ALDACTONE) 100 MG tablet Take 50 mg by mouth daily.   Yes [provider]  Morphine Sulfate (MORPHINE CONCENTRATE) 10 mg / 0.5 ml concentrated solution Take 0.5 mLs (10 mg total) by mouth every 2 (two) hours as needed for severe pain or shortness of breath. Patient not taking: Reported on 02/15/2017 01/21/17   Charlynne Cousins, MD    Physical Exam: Vitals:   02/17/17 0941 02/17/17 0944 02/17/17 1149  BP:  111/66 100/60  Pulse:  88 91  Resp:  20 18  Temp:  97.7 F (36.5 C) 97.6 F (36.4 C)  TempSrc:  Oral Oral  SpO2: 100% 100% 100%    Constitutional: calm, NAD Vitals:   02/17/17 0941 02/17/17 0944 02/17/17 1149  BP:  111/66 100/60  Pulse:  88 91  Resp:  20 18  Temp:  97.7 F (36.5 C) 97.6 F (36.4 C)  TempSrc:  Oral Oral  SpO2: 100% 100% 100%   Eyes: PERRL, lids and conjunctivae normal ENMT: Mucous membranes are moist. Posterior pharynx clear of any exudate or lesions.Normal dentition.  Neck: normal, supple, no masses, no thyromegaly Respiratory: diminished breath sounds at bases  Cardiovascular: Regular rate and rhythm, no rubs / gallops. 2+ pedal pulses. No carotid bruits.  Abdomen: Abd distension with fluid wave, slightly tender to touch. No hepatosplenomegaly. Bowel sounds positive.  Musculoskeletal: No joint deformity upper and lower extremities. Good ROM, no contractures.  Skin: no ulcers. No induration Neurologic: CN 2-12 grossly intact. Sensation intact, DTR normal. Strength 5/5 in all 4.  Psychiatric: Normal judgment and insight. Alert and oriented x 3. Normal mood.   Labs on Admission: I have personally reviewed following labs and imaging studies  CBC: Recent Labs  Lab 02/17/17 1015  WBC 6.8  NEUTROABS 5.0  HGB 10.6*  HCT 32.3*  MCV 89.0  PLT 809   Basic Metabolic Panel: Recent Labs  Lab 02/17/17 1015  NA 128*  K 4.8  CL 100*  CO2 21*  GLUCOSE 345*  BUN 61*  CREATININE 2.17*  CALCIUM 8.2*    Liver Function Tests: Recent Labs  Lab 02/17/17 1015  AST 21  ALT 17  ALKPHOS 80  BILITOT 1.0  PROT 5.7*  ALBUMIN 2.7*   Coagulation Profile: Recent Labs  Lab 02/17/17 1015  INR 1.18   Urine analysis:    Component Value Date/Time   COLORURINE AMBER (A) 02/07/2017 1401   APPEARANCEUR CLEAR 02/07/2017 1401   LABSPEC 1.013 02/07/2017 1401   PHURINE 5.0 02/07/2017 1401   GLUCOSEU NEGATIVE 02/07/2017 1401  HGBUR NEGATIVE 02/07/2017 1401   BILIRUBINUR NEGATIVE 02/07/2017 1401   KETONESUR NEGATIVE 02/07/2017 1401   PROTEINUR NEGATIVE 02/07/2017 1401   NITRITE NEGATIVE 02/07/2017 1401   LEUKOCYTESUR NEGATIVE 02/07/2017 1401   Radiological Exams on Admission: US Paracentesis Result Date: 02/15/2017 Successful ultrasound-guided paracentesis yielding 8 liters of peritoneal fluid.  EKG: pending   Assessment/Plan Active Problems:   Ascites, idiopathic liver cirrhosis - requiring frequent large volume paracentesis - last one done 02/15/2017 and 8 L fluid removed - ED PA spoke with IR and hospice team - pt is candidate for drain placement but for this to be done, pt has to be temporarily taken off hospice, placed under Medicaid and after this is done, can be placed back on hospice  - plan to place drain in AM - continue Lasix and Spironolactone     CKD stage III - Cr in the past month ~ 2.7 - current Cr appears to be at baseline - BMP in AM    DM type II with complications of nephropathy - place on home medical regimen insulin and hold oral glimepiride  - add SSI     Hyponatremia - due to volume status - will monitor while in hospital   DVT prophylaxis: SCD's Code Status: DNR Family Communication: Pt updated at bedside Disposition Plan: to be determined, likely home in AM Consults called: IR Admission status: observation    Faye Ramsay MD [facility] Pager [phone]  If 7PM-7AM, please contact night-coverage [email or web address] Password  [facility]  02/17/2017, 12:48 PM

## 2017-02-17 NOTE — ED Triage Notes (Signed)
Per EMS pt has paracentesis on New Year's Day; site "opened up" today; dressing has been applied.

## 2017-02-17 NOTE — Clinical Social Work Note (Signed)
Clinical Social Work Assessment  Patient Details  Name: Natalie Lane MRN: [id number] Date of Birth: 04/07/1945  Date of referral:  02/17/17               Reason for consult:  Discharge Planning                Permission sought to share information with:    Permission granted to share information::     Name::        Agency::     Relationship::     Contact Information:     Housing/Transportation Living arrangements for the past 2 months:  Single Family Home Source of Information:  Patient Patient Interpreter Needed:  None Criminal Activity/Legal Involvement Pertinent to Current Situation/Hospitalization:    Significant Relationships:  Significant Other Lives with:  Self, Significant Other Do you feel safe going back to the place where you live?  Yes Need for family participation in patient care:     Care giving concerns:  Patient from home using hospice services potentially needing to revoke.    Social Worker assessment / plan:  CSW met with patient via bedside to discuss current discharge plans/ hospice care. Patient currently lives at home with boyfriend who has moved in to assist her with care. Patient is receiving Hospice and Palliative Care services through [facility] while at home. At this time, patient is being admitted for drain placement- patient has revoked Hospice Care services in order for Medicare to cover surgery expensive's. Patient voiced desire to return home with long-term boyfriend if hospital stay goes smoothly. Patient already has needed equipment at home. CSW notified RN and EDP. CSW also notified RN CM in the event patient returns home.   Employment status:  Retired Forensic scientist:  Medicare PT Recommendations:  Not assessed at this time Information / Referral to community resources:     Patient/Family's Response to care:  Patient appreciated CSW.   Patient/Family's Understanding of and Emotional Response to Diagnosis, Current Treatment, and  Prognosis: Unknown.   Emotional Assessment Appearance:  Appears stated age Attitude/Demeanor/Rapport:    Affect (typically observed):  Accepting, Pleasant, Happy Orientation:  Oriented to Self, Oriented to Place, Oriented to  Time, Oriented to Situation Alcohol / Substance use:    Psych involvement (Current and /or in the community):  No (Comment)  Discharge Needs  Concerns to be addressed:  No discharge needs identified Readmission within the last 30 days:  Yes Current discharge risk:  None Barriers to Discharge:  No Barriers Identified   [name], LCSW 02/17/2017, 1:12 PM

## 2017-02-17 NOTE — ED Notes (Signed)
Pt given ice chips per EDP approval. RN assisted pt to call son.  Pts son told pt he would be here in about an hour.

## 2017-02-17 NOTE — ED Notes (Signed)
ED TO INPATIENT HANDOFF REPORT  Name/Age/Gender Natalie Lane 72 y.o. female  Code Status Code Status History    Date Active Date Inactive Code Status Order ID Comments User Context   01/31/2017 19:36 02/02/2017 19:33 DNR [id number]  Vianne Bulls, MD ED   01/20/2017 12:12 01/21/2017 18:41 DNR [id number]  Earlie Counts, NP Inpatient   01/19/2017 17:19 01/20/2017 12:12 Full Code [id number]  Rondel Jumbo, PA-C ED   11/19/2016 17:24 11/21/2016 15:52 Full Code [id number]  Georgette Shell, MD ED   09/02/2016 19:52 09/05/2016 17:45 Full Code [id number]  Vianne Bulls, MD ED   08/23/2016 21:11 08/25/2016 14:47 Full Code [id number]  Etta Quill, DO ED   05/10/2016 20:01 05/12/2016 19:03 Full Code [id number]  Modena Jansky, MD Inpatient   08/31/2011 23:59 09/02/2011 19:07 Full Code [id number]  Porfirio Mylar, RN Inpatient    Questions for Most Recent Historical Code Status (Order [id number])    Question Answer Comment   In the event of cardiac or respiratory ARREST Do not call a "code blue"    In the event of cardiac or respiratory ARREST Do not perform Intubation, CPR, defibrillation or ACLS    In the event of cardiac or respiratory ARREST Use medication by any route, position, wound care, and other measures to relive pain and suffering. May use oxygen, suction and manual treatment of airway obstruction as needed for comfort.         Advance Directive Documentation     Most Recent Value  Type of Advance Directive  Out of facility DNR (pink MOST or yellow form)  Pre-existing out of facility DNR order (yellow form or pink MOST form)  Yellow form placed in chart (order not valid for inpatient use)  "MOST" Form in Place?  No data      Home/SNF/Other Home  Chief Complaint Abd Pain  Level of Care/Admitting Diagnosis ED Disposition    ED Disposition Condition Comment   Admit  Hospital [facility] [[id number]]  Level of Care: Med-Surg [16]  Diagnosis:  Ascites [938101]  Admitting Physician: Maryruth Hancock  Attending Physician: Theodis Blaze [[id number]]  PT Class (Do Not Modify): Observation [104]  PT Acc Code (Do Not Modify): Observation [10022]       Medical History Past Medical History:  Diagnosis Date  . Anemia   . Ascites   . CHD (congenital heart disease)    a. 1979 s/p septal defect repair (? VSD vs ASD), old op notes not available;  b. 04/2016 Echo: EF 60-65%, no rwma, Gr2 DD, mild AS, mild TR, no PFO (prev repaired).  . Chronic diastolic CHF (congestive heart failure) ([place])    04/2016 Echo: EF 60-65%, no rwma, Gr2 DD.  Marland Kitchen Complication of anesthesia   . 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 04/2016 s/p paracentesis.  . Diabetes mellitus, type 2 ([place])   . Dyspnea   . Edema   . Gastric varices    a. s/p banding.  Marland Kitchen GERD (gastroesophageal reflux disease)   . History of blood transfusion   . Hyperlipidemia   . Normal stress echocardiogram 2008  . Pancreatic adenoma   . Paroxysmal atrial flutter ([place])    a. 11/2009 s/p RFCA.  Marland Kitchen PONV (postoperative nausea and vomiting)    with Cholecystectomy  . Tobacco abuse    a. smokes 4 cigarettes/day.    Allergies Allergies  Allergen Reactions  . Monosodium Glutamate Anaphylaxis    MSG  .  Red Dye Anaphylaxis  . Erythromycin Other (See Comments)    Reaction:  Migraine   . Penicillins Other (See Comments)    Reaction:  Migraine  Has patient had a PCN reaction causing immediate rash, facial/tongue/throat swelling, SOB or lightheadedness with hypotension: No Has patient had a PCN reaction causing severe rash involving mucus membranes or skin necrosis: No Has patient had a PCN reaction that required hospitalization No Has patient had a PCN reaction occurring within the last 10 years: No If all of the above answers are "NO", then may proceed with Cephalosporin use.  . Statins Other (See Comments)    Reaction:  Severe leg cramps   . Sulfa Antibiotics Hives  . Glipizide Rash  .  Iohexol Rash  . Tape Rash    MEDICAL "PLASTIC" TAPE CAUSES BLISTERS!!! PLEASE USE AN ALTERNATIVE    IV Location/Drains/Wounds Patient Lines/Drains/Airways Status   Active Line/Drains/Airways    Name:   Placement date:   Placement time:   Site:   Days:   Peripheral IV 02/17/17 Left Hand   02/17/17    1012    Hand   less than 1   Pressure Injury 08/23/16 Stage II -  Partial thickness loss of dermis presenting as a shallow open ulcer with a red, pink wound bed without slough. circular 0.5 cm length and width.  This is NOT a pressure injury; it is a full thickness wound   08/23/16    2352     178          Labs/Imaging Results for orders placed or performed during the hospital encounter of 02/17/17 (from the past 48 hour(s))  CBC with Differential     Status: Abnormal   Collection Time: 02/17/17 10:15 AM  Result Value Ref Range   WBC 6.8 4.0 - 10.5 K/uL   RBC 3.63 (L) 3.87 - 5.11 MIL/uL   Hemoglobin 10.6 (L) 12.0 - 15.0 g/dL   HCT 32.3 (L) 36.0 - 46.0 %   MCV 89.0 78.0 - 100.0 fL   MCH 29.2 26.0 - 34.0 pg   MCHC 32.8 30.0 - 36.0 g/dL   RDW 13.4 11.5 - 15.5 %   Platelets 284 150 - 400 K/uL   Neutrophils Relative % 73 %   Lymphocytes Relative 18 %   Monocytes Relative 5 %   Eosinophils Relative 4 %   Basophils Relative 0 %   Neutro Abs 5.0 1.7 - 7.7 K/uL   Lymphs Abs 1.2 0.7 - 4.0 K/uL   Monocytes Absolute 0.3 0.1 - 1.0 K/uL   Eosinophils Absolute 0.3 0.0 - 0.7 K/uL   Basophils Absolute 0.0 0.0 - 0.1 K/uL   WBC Morphology TOXIC GRANULATION   Comprehensive metabolic panel     Status: Abnormal   Collection Time: 02/17/17 10:15 AM  Result Value Ref Range   Sodium 128 (L) 135 - 145 mmol/L   Potassium 4.8 3.5 - 5.1 mmol/L   Chloride 100 (L) 101 - 111 mmol/L   CO2 21 (L) 22 - 32 mmol/L   Glucose, Bld 345 (H) 65 - 99 mg/dL   BUN 61 (H) 6 - 20 mg/dL   Creatinine, Ser 2.17 (H) 0.44 - 1.00 mg/dL   Calcium 8.2 (L) 8.9 - 10.3 mg/dL   Total Protein 5.7 (L) 6.5 - 8.1 g/dL   Albumin  2.7 (L) 3.5 - 5.0 g/dL   AST 21 15 - 41 U/L   ALT 17 14 - 54 U/L   Alkaline Phosphatase 80 38 -  126 U/L   Total Bilirubin 1.0 0.3 - 1.2 mg/dL   GFR calc non Af Amer 22 (L) >60 mL/min   GFR calc Af Amer 25 (L) >60 mL/min    Comment: (NOTE) The eGFR has been calculated using the CKD EPI equation. This calculation has not been validated in all clinical situations. eGFR's persistently <60 mL/min signify possible Chronic Kidney Disease.    Anion gap 7 5 - 15  Protime-INR     Status: None   Collection Time: 02/17/17 10:15 AM  Result Value Ref Range   Prothrombin Time 14.9 11.4 - 15.2 seconds   INR 1.18    No results found.  Pending Labs Unresulted Labs (From admission, onward)   None      Vitals/Pain Today's Vitals   02/17/17 0944 02/17/17 1149 02/17/17 1331 02/17/17 1333  BP: 111/66 100/60 (!) 79/51   Pulse: 88 91 85   Resp: '20 18 17   '$ Temp: 97.7 F (36.5 C) 97.6 F (36.4 C)    TempSrc: Oral Oral    SpO2: 100% 100% 98%   PainSc:    4     Isolation Precautions No active isolations  Medications Medications  fentaNYL (SUBLIMAZE) injection 50 mcg (50 mcg Intravenous Given 02/17/17 1333)    Mobility walks with device

## 2017-02-17 NOTE — ED Notes (Signed)
Bed: JO83 Expected date:  Expected time:  Means of arrival:  Comments: EMS 72 yo abd pain

## 2017-02-18 ENCOUNTER — Observation Stay (HOSPITAL_COMMUNITY): Payer: Medicare Other

## 2017-02-18 ENCOUNTER — Encounter (HOSPITAL_COMMUNITY): Payer: Self-pay | Admitting: Interventional Radiology

## 2017-02-18 ENCOUNTER — Ambulatory Visit (HOSPITAL_COMMUNITY): Payer: Self-pay

## 2017-02-18 DIAGNOSIS — R180 Malignant ascites: Secondary | ICD-10-CM | POA: Diagnosis not present

## 2017-02-18 DIAGNOSIS — K7460 Unspecified cirrhosis of liver: Secondary | ICD-10-CM | POA: Diagnosis not present

## 2017-02-18 DIAGNOSIS — R188 Other ascites: Secondary | ICD-10-CM | POA: Diagnosis not present

## 2017-02-18 DIAGNOSIS — R100 Acute abdomen: Secondary | ICD-10-CM | POA: Diagnosis not present

## 2017-02-18 HISTORY — PX: IR PERC TUN PERIT CATH WO PORT S&I /IMAG: IMG2327

## 2017-02-18 LAB — BASIC METABOLIC PANEL
ANION GAP: 7 (ref 5–15)
BUN: 57 mg/dL — ABNORMAL HIGH (ref 6–20)
CO2: 22 mmol/L (ref 22–32)
Calcium: 8.5 mg/dL — ABNORMAL LOW (ref 8.9–10.3)
Chloride: 103 mmol/L (ref 101–111)
Creatinine, Ser: 2.12 mg/dL — ABNORMAL HIGH (ref 0.44–1.00)
GFR calc Af Amer: 26 mL/min — ABNORMAL LOW (ref >60)
GFR, EST NON AFRICAN AMERICAN: 22 mL/min — AB (ref >60)
Glucose, Bld: 186 mg/dL — ABNORMAL HIGH (ref 65–99)
POTASSIUM: 4.6 mmol/L (ref 3.5–5.1)
SODIUM: 132 mmol/L — AB (ref 135–145)

## 2017-02-18 LAB — CBC WITH DIFFERENTIAL/PLATELET
BASOS ABS: 0 10*3/uL (ref 0.0–0.1)
Basophils Relative: 1 %
Eosinophils Absolute: 0.5 10*3/uL (ref 0.0–0.7)
Eosinophils Relative: 8 %
HEMATOCRIT: 31.1 % — AB (ref 36.0–46.0)
Hemoglobin: 10.5 g/dL — ABNORMAL LOW (ref 12.0–15.0)
LYMPHS PCT: 23 %
Lymphs Abs: 1.3 10*3/uL (ref 0.7–4.0)
MCH: 29.9 pg (ref 26.0–34.0)
MCHC: 33.8 g/dL (ref 30.0–36.0)
MCV: 88.6 fL (ref 78.0–100.0)
Monocytes Absolute: 0.3 10*3/uL (ref 0.1–1.0)
Monocytes Relative: 6 %
NEUTROS ABS: 3.7 10*3/uL (ref 1.7–7.7)
Neutrophils Relative %: 62 %
PLATELETS: 270 10*3/uL (ref 150–400)
RBC: 3.51 MIL/uL — ABNORMAL LOW (ref 3.87–5.11)
RDW: 13.5 % (ref 11.5–15.5)
WBC: 5.9 10*3/uL (ref 4.0–10.5)

## 2017-02-18 LAB — GLUCOSE, CAPILLARY
GLUCOSE-CAPILLARY: 126 mg/dL — AB (ref 65–99)
Glucose-Capillary: 110 mg/dL — ABNORMAL HIGH (ref 65–99)
Glucose-Capillary: 218 mg/dL — ABNORMAL HIGH (ref 65–99)

## 2017-02-18 MED ORDER — FENTANYL CITRATE (PF) 100 MCG/2ML IJ SOLN
INTRAMUSCULAR | Status: AC
  Filled 2017-02-18: qty 4

## 2017-02-18 MED ORDER — LIDOCAINE HCL 1 % IJ SOLN
INTRAMUSCULAR | Status: AC
  Filled 2017-02-18: qty 20

## 2017-02-18 MED ORDER — FENTANYL CITRATE (PF) 100 MCG/2ML IJ SOLN
INTRAMUSCULAR | Status: AC | PRN
  Administered 2017-02-18 (×2): 50 ug via INTRAVENOUS

## 2017-02-18 MED ORDER — MIDAZOLAM HCL 2 MG/2ML IJ SOLN
INTRAMUSCULAR | Status: AC
  Filled 2017-02-18: qty 4

## 2017-02-18 MED ORDER — VANCOMYCIN HCL IN DEXTROSE 1-5 GM/200ML-% IV SOLN
INTRAVENOUS | Status: AC
  Administered 2017-02-18: 1000 mg via INTRAVENOUS
  Filled 2017-02-18: qty 200

## 2017-02-18 MED ORDER — NALOXONE HCL 0.4 MG/ML IJ SOLN
INTRAMUSCULAR | Status: AC
  Filled 2017-02-18: qty 1

## 2017-02-18 MED ORDER — FLUMAZENIL 0.5 MG/5ML IV SOLN
INTRAVENOUS | Status: AC
  Filled 2017-02-18: qty 5

## 2017-02-18 MED ORDER — MIDAZOLAM HCL 2 MG/2ML IJ SOLN
INTRAMUSCULAR | Status: AC | PRN
  Administered 2017-02-18 (×2): 1 mg via INTRAVENOUS

## 2017-02-18 NOTE — Sedation Documentation (Signed)
Patient denies pain and is resting comfortably.  

## 2017-02-18 NOTE — Procedures (Signed)
Interventional Radiology Procedure Note  Procedure: Tunneled peritoneal drainage catheter  Complications: None  Estimated Blood Loss: < 10 mL  Findings: PleurX catheter placed via RLQ approach.  Milky ascites return.  Total of 4.8 L removed.  Venetia Night. Kathlene Cote, M.D Pager:  [phone]

## 2017-02-18 NOTE — Progress Notes (Signed)
Discharge instructions reviewed with patient and patient verbalized understanding. Patient informs the nurse that she will need ambulance transport home. Case management on call to be made aware.

## 2017-02-18 NOTE — Progress Notes (Signed)
Referring Physician(s): Smith,C/Myers,I  Supervising Physician: Aletta Edouard  Patient Status:  [facility] - In-pt  Chief Complaint:  Recurrent ascites, end stage liver disease/cirrhosis  Subjective: Pt familiar to IR service from multiple paracenteses, last on 02/15/17 yielding 8 liters. She has end stage NASH cirrhosis, CKD, DM, HTN. She is now under hospice care and request received for tunneled peritoneal drain placement due to need for recurrent large volume paracenteses.She currently denies fever,HA, CP,cough, back pain,N/V or bleeding. She does have some dyspnea and abd distension. Past Medical History:  Diagnosis Date  . Anemia   . Ascites   . CHD (congenital heart disease)    a. 1979 s/p septal defect repair (? VSD vs ASD), old op notes not available;  b. 04/2016 Echo: EF 60-65%, no rwma, Gr2 DD, mild AS, mild TR, no PFO (prev repaired).  . Chronic diastolic CHF (congestive heart failure) ([place])    04/2016 Echo: EF 60-65%, no rwma, Gr2 DD.  Marland Kitchen Complication of anesthesia   . 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 04/2016 s/p paracentesis.  . Diabetes mellitus, type 2 ([place])   . Dyspnea   . Edema   . Gastric varices    a. s/p banding.  Marland Kitchen GERD (gastroesophageal reflux disease)   . History of blood transfusion   . Hyperlipidemia   . Normal stress echocardiogram 2008  . Pancreatic adenoma   . Paroxysmal atrial flutter ([place])    a. 11/2009 s/p RFCA.  Marland Kitchen PONV (postoperative nausea and vomiting)    with Cholecystectomy  . Tobacco abuse    a. smokes 4 cigarettes/day.   Past Surgical History:  Procedure Laterality Date  . ABDOMINAL HYSTERECTOMY    . ASD versus VSD repair  1979   Operative notes not available.  . ATRIAL ABLATION SURGERY  10 /2011  . [place]   x2  . CHOLECYSTECTOMY    . ESOPHAGOGASTRODUODENOSCOPY (EGD) WITH PROPOFOL N/A 11/16/2013   Procedure: ESOPHAGOGASTRODUODENOSCOPY (EGD) WITH PROPOFOL;  Surgeon: Lear Ng, MD;  Location: [facility];   Service: Endoscopy;  Laterality: N/A;  . ESOPHAGOGASTRODUODENOSCOPY (EGD) WITH PROPOFOL N/A 01/23/2014   Procedure: ESOPHAGOGASTRODUODENOSCOPY (EGD) WITH PROPOFOL;  Surgeon: Lear Ng, MD;  Location: [facility];  Service: Endoscopy;  Laterality: N/A;  . GASTRIC VARICES BANDING N/A 11/16/2013   Procedure: GASTRIC VARICES BANDING;  Surgeon: Lear Ng, MD;  Location: [place];  Service: Endoscopy;  Laterality: N/A;  . GASTRIC VARICES BANDING N/A 01/23/2014   Procedure: GASTRIC VARICES BANDING;  Surgeon: Lear Ng, MD;  Location: [place];  Service: Endoscopy;  Laterality: N/A;  . HERNIA REPAIR    . IR GENERIC HISTORICAL  05/11/2016   IR PARACENTESIS 05/11/2016 Ascencion Dike, PA-C [facility]  . IR PARACENTESIS  07/08/2016  . IR PARACENTESIS  08/09/2016  . IR PARACENTESIS  08/24/2016  . IR PARACENTESIS  09/03/2016  . IR PARACENTESIS  09/16/2016  . IR PARACENTESIS  09/23/2016  . IR PARACENTESIS  10/07/2016  . IR PARACENTESIS  10/14/2016  . IR PARACENTESIS  10/27/2016  . IR PARACENTESIS  11/09/2016  . IR PARACENTESIS  11/30/2016  . IR PARACENTESIS  12/06/2016  . IR PARACENTESIS  12/13/2016  . IR PARACENTESIS  12/23/2016  . IR PARACENTESIS  12/30/2016  . IR PARACENTESIS  01/12/2017  . IR PARACENTESIS  02/01/2017  . OTHER SURGICAL HISTORY     partial hysterectomy  . PARACENTESIS  09/03/2016      Allergies: Monosodium glutamate; Red dye; Erythromycin; Penicillins;  Statins; Sulfa antibiotics; Glipizide; Iohexol; and Tape  Medications: Prior to Admission medications   Medication Sig Start Date End Date Taking? Authorizing Provider  acetaminophen (TYLENOL) 325 MG tablet Take 325 mg by mouth every 6 (six) hours as needed for moderate pain.   Yes [provider]  amiodarone (PACERONE) 200 MG tablet Take 100 mg by mouth daily.   Yes [provider]  furosemide (LASIX) 80 MG tablet Take 1 tablet (80 mg total) by mouth 2 (two) times daily. 09/05/16   Yes Velvet Bathe, MD  glimepiride (AMARYL) 2 MG tablet Take 2 mg by mouth 2 (two) times daily before a meal.   Yes [provider]  Homeopathic Products (EQL EAR DROPS) SOLN Place 5 drops into the right ear daily.   Yes [provider]  HUMULIN N 100 UNIT/ML injection Inject 15-25 Units into the skin See admin instructions. 25 units in the morning before breakfast and 15 units at bedtime 12/15/16  Yes [provider]  hydroxypropyl methylcellulose / hypromellose (ISOPTO TEARS / GONIOVISC) 2.5 % ophthalmic solution Place 1 drop into both eyes 3 (three) times daily as needed for dry eyes.   Yes [provider]  hydrOXYzine (ATARAX/VISTARIL) 25 MG tablet Take 25 mg by mouth every 6 (six) hours as needed for itching.   Yes [provider]  LORazepam (ATIVAN) 0.5 MG tablet Take 1 tablet (0.5 mg total) by mouth every 6 (six) hours as needed for anxiety. 01/21/17  Yes Charlynne Cousins, MD  ondansetron (ZOFRAN-ODT) 4 MG disintegrating tablet Take 4 mg by mouth every 8 (eight) hours as needed for nausea/vomiting. DISSOLVE IN THE MOUTH 12/07/16  Yes [provider]  OXYGEN Inhale 4 L into the lungs continuous.   Yes [provider]  prochlorperazine (COMPAZINE) 10 MG tablet Take 10 mg by mouth every 6 (six) hours as needed for nausea or vomiting.   Yes [provider]  ranitidine (ZANTAC) 150 MG tablet Take 150 mg by mouth daily. 02/16/17  Yes [provider]  Sennosides (SENOKOT PO) Take 1 tablet by mouth daily.    Yes [provider]  spironolactone (ALDACTONE) 100 MG tablet Take 50 mg by mouth daily.   Yes [provider]  Morphine Sulfate (MORPHINE CONCENTRATE) 10 mg / 0.5 ml concentrated solution Take 0.5 mLs (10 mg total) by mouth every 2 (two) hours as needed for severe pain or shortness of breath. Patient not taking: Reported on 02/15/2017 01/21/17   Charlynne Cousins, MD     Vital Signs: BP (!)  94/56 (BP Location: Left Arm)   Pulse 83   Temp 98.6 F (37 C) (Oral)   Resp 18   Ht 5\' 3"  (1.6 m)   Wt 178 lb 9.2 oz (81 kg)   SpO2 99%   BMI 31.63 kg/m   Physical Exam awake/alert; chest- CTA bilat; heart- RRR, +murmur; abd- dist,few BS, mild diffuse tenderness; no LE edema  Imaging: US Paracentesis  Result Date: 02/15/2017 INDICATION: NASH cirrhosis. Recurrent ascites. Request for therapeutic paracentesis. EXAM: ULTRASOUND GUIDED ANTERIOR ABDOMEN PARACENTESIS MEDICATIONS: 1% Lidocaine = 10 mL COMPLICATIONS: None immediate. PROCEDURE: Informed written consent was obtained from the patient after a discussion of the risks, benefits and alternatives to treatment. A timeout was performed prior to the initiation of the procedure. Initial ultrasound scanning demonstrates a large amount of ascites within the abdomen. The abdomen was prepped and draped in the usual sterile fashion. 1% lidocaine with epinephrine was used for local anesthesia.  Following this, a 19 gauge, 10-cm, Yueh catheter was introduced. An ultrasound image was saved for documentation purposes. The paracentesis was performed. The catheter was removed and a dressing was applied. The patient tolerated the procedure well without immediate post procedural complication. FINDINGS: A total of approximately 8 liters of cloudy light yellow fluid was removed. IMPRESSION: Successful ultrasound-guided paracentesis yielding 8 liters of peritoneal fluid. Read by:  Gareth Eagle, PA-C Electronically Signed   By: Corrie Mckusick D.O.   On: 02/15/2017 13:19    Labs:  CBC: Recent Labs    01/31/17 1553 01/31/17 1633 02/07/17 1400 02/17/17 1015 02/18/17 0350  WBC 6.1  --  6.7 6.8 5.9  HGB 11.0* 11.6* 11.5* 10.6* 10.5*  HCT 33.1* 34.0* 34.2* 32.3* 31.1*  PLT 210  --  212 284 270    COAGS: Recent Labs    05/10/16 1431 06/26/16 0006  11/19/16 1500 01/20/17 0104 01/31/17 1553 02/17/17 1015  INR 1.32 1.27   < > 1.18 1.15 1.20 1.18  APTT  44* 38*  --  33  --   --   --    < > = values in this interval not displayed.    BMP: Recent Labs    01/31/17 1553 01/31/17 1633 02/07/17 1700 02/17/17 1015 02/18/17 0350  NA 131* 132* 131* 128* 132*  K 4.8 4.7 4.8 4.8 4.6  CL 100* 99* 101 100* 103  CO2 23  --  22 21* 22  GLUCOSE 205* 198* 146* 345* 186*  BUN 63* 54* 65* 61* 57*  CALCIUM 8.7*  --  8.7* 8.2* 8.5*  CREATININE 2.43* 2.20* 2.45* 2.17* 2.12*  GFRNONAA 19*  --  19* 22* 22*  GFRAA 22*  --  22* 25* 26*    LIVER FUNCTION TESTS: Recent Labs    01/20/17 0104 01/31/17 1553 02/07/17 1700 02/17/17 1015  BILITOT 0.6 0.9 0.9 1.0  AST 28 26 23 21   ALT 22 19 16 17   ALKPHOS 71 66 57 80  PROT 5.1* 5.9* 5.7* 5.7*  ALBUMIN 2.8* 3.0* 2.9* 2.7*    Assessment and Plan: Pt with hx end stage NASH cirrhosis, CKD, DM, HTN. She is now under hospice care and request received for tunneled peritoneal drain placement due to need for recurrent large volume paracenteses.Risks and benefits discussed with the patient including bleeding, infection, damage to adjacent structures, bowel perforation/fistula connection, and sepsis. All of the patient's questions were answered, patient is agreeable to proceed. Consent signed and in chart.     Electronically Signed: D. Rowe Robert, PA-C 02/18/2017, 8:53 AM   I spent a total of 20 minutes at the the patient's bedside AND on the patient's hospital floor or unit, greater than 50% of which was counseling/coordinating care for tunneled peritoneal drain placement    Patient ID: Natalie Lane, female   DOB: 1945-03-25, 72 y.o.   MRN: [id number]

## 2017-02-18 NOTE — Progress Notes (Addendum)
Per [facility] rep pt revoked hospice services so she could have pleurex placed. This CM met with pt at bedside to ask her who she would like to assist her with her Pleurex drain at home and she states that she would like to have [facility] re-consulted to come back to her home. MD made aware and MaryAnn from [facility] contacted with referral. This CM asked unit secretary to order Pleurex drain canisters for pt to take home with her. This CM also started filling out order form for additional canisters to be sent to pt home. MD paged to fill out order form. RN to alert CM when form filled out so form can be faxed to pleurex canister company. Marney Doctor RN,BSN,NCM [phone]

## 2017-02-18 NOTE — Progress Notes (Signed)
[facility] Liaison: RN visit  Notified by Marney Doctor, [facility] of patient/family request for [facility] services at home after discharge. Chart and patient information under review by [facility] physician. Hospice eligibility pending at this time.  Writer spoke with patient and her son, Linus Orn, at bedside to initiate education related to hospice philosophy, services and team approach to care. Patient and family verbalized understanding of information given. Per discussion, plan is for discharge to home by [facility].  Please send signed and completed DNR form home with patient/family. Patient will need prescriptions for discharge comfort medications.  DME needs have been discussed, patient currently has the following equipment in the home: 3N1, walker, shower chair, Oxygen. There is no need for additional equipment at this time. [facility] information and contact numbers given to patient and family at time of visit. Above information shared with Marney Doctor, CMRN.  Please call with any hospice related questions.  Thank you for this referral.  Farrel Gordon, RN, [facility] Liaison [phone] ? Hospital liaisons are now on [place]

## 2017-02-18 NOTE — Progress Notes (Signed)
Forms printed and [facility] notified of need to transport. Dr. Quincy Simmonds made aware of need for out of facility DNR form MD to come to the unit to sign form.

## 2017-02-18 NOTE — Progress Notes (Signed)
Patient states that nurse from hospice will come to home on Monday for assistance with paracentesis drain.

## 2017-02-18 NOTE — Discharge Summary (Signed)
Physician Discharge Summary  Natalie Lane  MGQ:[id number]  DOB: 05/13/1945  DOA: 02/17/2017 PCP: Carol Ada, MD  Admit date: 02/17/2017 Discharge date: 02/18/2017  Admitted From: Home  Disposition:  Home   Recommendations for Outpatient Follow-up:  1. Follow up with PCP in 1-2 weeks 2. Follow up with Hospice  3. Cath drain every 2-3 day or if abdominal distention occurs  4. Please obtain BMP/CBC in one week to monitor Hgb and Cr   Home Health: Hospice  Equipment/Devices: Abdominal pleurex   Discharge Condition: Hospice  CODE STATUS: DNR  Diet recommendation: Heart Healthy/Carb modify   Brief/Interim Summary: HPI by Dr Doyle Askew: Natalie Lane is a 72 y.o. female with known HTN, HLD, DM, severe idiopathic liver cirrhosis, requiring recurrent large volume paracentesis, last one done on January 1st and 8 L fluid removed. Patient reports feeling better at first but started to notice leakage at the site of the injection. She explains worsening abd distension and associated exertional dyspnea that she says its due to worsening fluid. Patient also reports poor oral intake and nausea. Pt is under hospice care.   Patient admitted for drain placement by IR. Patient underwent PleuX cath placement via RLQ [place], removed 4.8 L of milky ascites. Patient tolerated well procedure and clinically stable. Patient diet was advanced and tolerated well. Hospice referral was made again and patient was discharge home.   Subjective: Patient seen and examined post op. Doing well c/o mild tenderness at the RLQ. No other concern. Stated her belly is less distended and feels close to baseline. 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 Patient on Hospice. Continue Lasix and Aldactone   CKD stage III  Cr at baseline   Check BMP in 1 week   DM type 2 stable  Continue home meds with no changes in doses at this time   Hyponatremia  Due to volume status  Improved  Continue to monitor   All other chronic medical condition were stable during the hospitalization.  On the day of the discharge the patient's vitals were stable, and no other acute medical condition were reported by patient. the patient was felt safe to be discharge to home with hospice   Discharge Instructions  You were cared for by a hospitalist during your hospital stay. If you have any questions about your discharge medications or the care you received while you were in the hospital after you are discharged, you can call the unit and asked to speak with the hospitalist on call if the hospitalist that took care of you is not available. Once you are discharged, your primary care physician will handle any further medical issues. Please note that NO REFILLS for any discharge medications will be authorized once you are discharged, as it is imperative that you return to your primary care physician (or establish a relationship with a primary care physician if you do not have one) for your aftercare needs so that they can reassess your need for medications and monitor your lab values.  Discharge Instructions    Call MD for:  difficulty breathing, headache or visual disturbances   Complete by:  As directed    Call MD for:  extreme fatigue   Complete by:  As directed    Call MD for:  hives   Complete by:  As directed  Call MD for:  persistant dizziness or light-headedness   Complete by:  As directed    Call MD for:  persistant nausea and vomiting   Complete by:  As directed    Call MD for:  redness, tenderness, or signs of infection (pain, swelling, redness, odor or green/yellow discharge around incision site)   Complete by:  As directed    Call MD for:  severe uncontrolled pain   Complete by:  As directed    Call MD for:  temperature >100.4    Complete by:  As directed    Diet - low sodium heart healthy   Complete by:  As directed    Discharge instructions   Complete by:  As directed    Peritoneal drain every 2-3 days, or when feels distended.  Follow up with PCP   Increase activity slowly   Complete by:  As directed      Allergies as of 02/18/2017      Reactions   Monosodium Glutamate Anaphylaxis   MSG   Red Dye Anaphylaxis   Erythromycin Other (See Comments)   Reaction:  Migraine    Penicillins Other (See Comments)   Reaction:  Migraine  Has patient had a PCN reaction causing immediate rash, facial/tongue/throat swelling, SOB or lightheadedness with hypotension: No Has patient had a PCN reaction causing severe rash involving mucus membranes or skin necrosis: No Has patient had a PCN reaction that required hospitalization No Has patient had a PCN reaction occurring within the last 10 years: No If all of the above answers are "NO", then may proceed with Cephalosporin use.   Statins Other (See Comments)   Reaction:  Severe leg cramps    Sulfa Antibiotics Hives   Glipizide Rash   Iohexol Rash   Tape Rash   MEDICAL "PLASTIC" TAPE CAUSES BLISTERS!!! PLEASE USE AN ALTERNATIVE      Medication List    TAKE these medications   acetaminophen 325 MG tablet Commonly known as:  TYLENOL Take 325 mg by mouth every 6 (six) hours as needed for moderate pain.   amiodarone 200 MG tablet Commonly known as:  PACERONE Take 100 mg by mouth daily.   EQL EAR DROPS Soln Place 5 drops into the right ear daily.   furosemide 80 MG tablet Commonly known as:  LASIX Take 1 tablet (80 mg total) by mouth 2 (two) times daily.   glimepiride 2 MG tablet Commonly known as:  AMARYL Take 2 mg by mouth 2 (two) times daily before a meal.   HUMULIN N 100 UNIT/ML injection Generic drug:  insulin NPH Human Inject 15-25 Units into the skin See admin instructions. 25 units in the morning before breakfast and 15 units at bedtime    hydroxypropyl methylcellulose / hypromellose 2.5 % ophthalmic solution Commonly known as:  ISOPTO TEARS / GONIOVISC Place 1 drop into both eyes 3 (three) times daily as needed for dry eyes.   hydrOXYzine 25 MG tablet Commonly known as:  ATARAX/VISTARIL Take 25 mg by mouth every 6 (six) hours as needed for itching.   LORazepam 0.5 MG tablet Commonly known as:  ATIVAN Take 1 tablet (0.5 mg total) by mouth every 6 (six) hours as needed for anxiety.   morphine CONCENTRATE 10 mg / 0.5 ml concentrated solution Take 0.5 mLs (10 mg total) by mouth every 2 (two) hours as needed for severe pain or shortness of breath.   ondansetron 4 MG disintegrating tablet Commonly known as:  ZOFRAN-ODT Take 4 mg by  mouth every 8 (eight) hours as needed for nausea/vomiting. DISSOLVE IN THE MOUTH   OXYGEN Inhale 4 L into the lungs continuous.   prochlorperazine 10 MG tablet Commonly known as:  COMPAZINE Take 10 mg by mouth every 6 (six) hours as needed for nausea or vomiting.   ranitidine 150 MG tablet Commonly known as:  ZANTAC Take 150 mg by mouth daily.   SENOKOT PO Take 1 tablet by mouth daily.   spironolactone 100 MG tablet Commonly known as:  ALDACTONE Take 50 mg by mouth daily.      Follow-up Information    Carol Ada, MD. Schedule an appointment as soon as possible for a visit in 1 week(s).   Specialty:  Family Medicine Why:  Hospital follow up  Contact information: [address], [address] [place] [address] [phone]          Allergies  Allergen Reactions  . Monosodium Glutamate Anaphylaxis    MSG  . Red Dye Anaphylaxis  . Erythromycin Other (See Comments)    Reaction:  Migraine   . Penicillins Other (See Comments)    Reaction:  Migraine  Has patient had a PCN reaction causing immediate rash, facial/tongue/throat swelling, SOB or lightheadedness with hypotension: No Has patient had a PCN reaction causing severe rash involving mucus membranes or skin  necrosis: No Has patient had a PCN reaction that required hospitalization No Has patient had a PCN reaction occurring within the last 10 years: No If all of the above answers are "NO", then may proceed with Cephalosporin use.  . Statins Other (See Comments)    Reaction:  Severe leg cramps   . Sulfa Antibiotics Hives  . Glipizide Rash  . Iohexol Rash  . Tape Rash    MEDICAL "PLASTIC" TAPE CAUSES BLISTERS!!! PLEASE USE AN ALTERNATIVE    Consultations:  IR    Procedures/Studies: Dg Chest 2 View  Result Date: 02/07/2017 CLINICAL DATA:  Per order- dyspnea Pt has c/o SOB and a fall yesterday. Pt states she has no other chest complaints at this time.PT HX: CHF, DM EXAM: CHEST  2 VIEW COMPARISON:  01/31/2017 FINDINGS: Linear opacities are noted at the lung bases consistent with atelectasis, stable from prior study. Lungs are otherwise clear. Lung volumes are low. No pleural effusion or pneumothorax. Cardiac silhouette is normal size. Changes from a previous median sternotomy are stable. No mediastinal or hilar masses or convincing adenopathy. Skeletal structures are intact. IMPRESSION: 1. No acute cardiopulmonary disease. 2. Low lung volumes with lung base atelectasis stable from the prior study. Electronically Signed   By: Lajean Manes M.D.   On: 02/07/2017 14:58   Ct Head Wo Contrast  Result Date: 02/07/2017 CLINICAL DATA:  Fall yesterday. EXAM: CT HEAD WITHOUT CONTRAST CT CERVICAL SPINE WITHOUT CONTRAST TECHNIQUE: Multidetector CT imaging of the head and cervical spine was performed following the standard protocol without intravenous contrast. Multiplanar CT image reconstructions of the cervical spine were also generated. COMPARISON:  Head CT 12/12/2009 FINDINGS: CT HEAD FINDINGS Brain: Ventricles, cisterns and other CSF spaces are within normal. There is no mass, mass effect, shift of midline structures or acute hemorrhage. There is mild chronic ischemic microvascular disease present.  Vascular: No hyperdense vessel or unexpected calcification. Skull: Normal. Negative for fracture or focal lesion. Sinuses/Orbits: No acute finding. Other: None. CT CERVICAL SPINE FINDINGS Alignment: Normal. Skull base and vertebrae: Mild spondylosis of the cervical spine. Vertebral body heights are maintained. Atlantoaxial articulation is within normal. No evidence of acute fracture  or subluxation. Soft tissues and spinal canal: No prevertebral fluid or swelling. No visible canal hematoma. Disc levels:  Within normal. Upper chest: Negative. Other: None. IMPRESSION: No acute brain injury. Mild chronic ischemic microvascular disease. No acute cervical spine injury. Mild spondylosis of the cervical spine. Electronically Signed   By: Marin Olp M.D.   On: 02/07/2017 15:30   Ct Cervical Spine Wo Contrast  Result Date: 02/07/2017 CLINICAL DATA:  Fall yesterday. EXAM: CT HEAD WITHOUT CONTRAST CT CERVICAL SPINE WITHOUT CONTRAST TECHNIQUE: Multidetector CT imaging of the head and cervical spine was performed following the standard protocol without intravenous contrast. Multiplanar CT image reconstructions of the cervical spine were also generated. COMPARISON:  Head CT 12/12/2009 FINDINGS: CT HEAD FINDINGS Brain: Ventricles, cisterns and other CSF spaces are within normal. There is no mass, mass effect, shift of midline structures or acute hemorrhage. There is mild chronic ischemic microvascular disease present. Vascular: No hyperdense vessel or unexpected calcification. Skull: Normal. Negative for fracture or focal lesion. Sinuses/Orbits: No acute finding. Other: None. CT CERVICAL SPINE FINDINGS Alignment: Normal. Skull base and vertebrae: Mild spondylosis of the cervical spine. Vertebral body heights are maintained. Atlantoaxial articulation is within normal. No evidence of acute fracture or subluxation. Soft tissues and spinal canal: No prevertebral fluid or swelling. No visible canal hematoma. Disc levels:  Within  normal. Upper chest: Negative. Other: None. IMPRESSION: No acute brain injury. Mild chronic ischemic microvascular disease. No acute cervical spine injury. Mild spondylosis of the cervical spine. Electronically Signed   By: Marin Olp M.D.   On: 02/07/2017 15:30   US Paracentesis  Result Date: 02/15/2017 INDICATION: NASH cirrhosis. Recurrent ascites. Request for therapeutic paracentesis. EXAM: ULTRASOUND GUIDED ANTERIOR ABDOMEN PARACENTESIS MEDICATIONS: 1% Lidocaine = 10 mL COMPLICATIONS: None immediate. PROCEDURE: Informed written consent was obtained from the patient after a discussion of the risks, benefits and alternatives to treatment. A timeout was performed prior to the initiation of the procedure. Initial ultrasound scanning demonstrates a large amount of ascites within the abdomen. The abdomen was prepped and draped in the usual sterile fashion. 1% lidocaine with epinephrine was used for local anesthesia. Following this, a 19 gauge, 10-cm, Yueh catheter was introduced. An ultrasound image was saved for documentation purposes. The paracentesis was performed. The catheter was removed and a dressing was applied. The patient tolerated the procedure well without immediate post procedural complication. FINDINGS: A total of approximately 8 liters of cloudy light yellow fluid was removed. IMPRESSION: Successful ultrasound-guided paracentesis yielding 8 liters of peritoneal fluid. Read by:  Gareth Eagle, PA-C Electronically Signed   By: Corrie Mckusick D.O.   On: 02/15/2017 13:19   US Paracentesis  Result Date: 02/09/2017 INDICATION: Patient with history of NASH cirrhosis, recurrent ascites; request made for therapeutic paracentesis. EXAM: ULTRASOUND GUIDED THERAPEUTIC  PARACENTESIS MEDICATIONS: None. COMPLICATIONS: None immediate. PROCEDURE: Informed written consent was obtained from the patient after a discussion of the risks, benefits and alternatives to treatment. A timeout was performed prior to the  initiation of the procedure. Initial ultrasound scanning demonstrates a large amount of ascites within the right lower abdominal quadrant. The right lower abdomen was prepped and draped in the usual sterile fashion. 1% lidocaine was used for local anesthesia. Following this, a Yueh catheter was introduced. An ultrasound image was saved for documentation purposes. The paracentesis was performed. The catheter was removed and a dressing was applied. The patient tolerated the procedure well without immediate post procedural complication. FINDINGS: A total of approximately 4.8 liters of milky/chylous  fluid was removed. IMPRESSION: Successful ultrasound-guided therapeutic paracentesis yielding 4.8 liters of peritoneal fluid. Due to patient hypotension only the above amount of fluid was removed today. Read by: Rowe Robert, PA-C Electronically Signed   By: Jerilynn Mages.  Shick M.D.   On: 02/07/2017 16:34   Ir Perc Athena Masse Perit Cath Wo Port  Result Date: 02/18/2017 CLINICAL DATA:  Refractory ascites secondary to cirrhosis of the liver. Placement of tunneled peritoneal drainage catheter has been requested due to problems with significant hypotension with recent periodic large volume paracentesis procedures and overall poor prognosis. The last paracentesis procedure was performed on 02/15/2017 yielding 8 L of fluid. EXAM: INSERTION OF TUNNELED PERITONEAL DRAINAGE CATHETER ANESTHESIA/SEDATION: 2.0 Mg IV Versed; 100 mcg IV Fentanyl. Total Moderate Sedation Time 16 minutes. The patient's level of consciousness and physiologic status were continuously monitored during the procedure by Radiology nursing. MEDICATIONS: 1 gram IV vancomycin. Antibiotic was administered in an appropriate time interval for the procedure. FLUOROSCOPY TIME:  6 seconds.  3.2 mGy. PROCEDURE: The procedure, risks, benefits, and alternatives were explained to the patient. Questions regarding the procedure were encouraged and answered. The patient understands and  consents to the procedure. A time-out was performed prior to initiating the procedure. The right abdominal wall was prepped with chlorhexidine in a sterile fashion, and a sterile drape was applied covering the operative field. A sterile gown and sterile gloves were used for the procedure. Local anesthesia was provided with 1% Lidocaine. Ultrasound image documentation was performed. Fluoroscopy during the procedure and fluoroscopic spot radiograph confirms appropriate catheter position. After creating a small skin incision, a 19 gauge needle was advanced into the peritoneal cavity under ultrasound guidance. A guide wire was then advanced under fluoroscopy into the peritoneal cavity. Peritoneal access was dilated serially and a 16-French peel-away sheath placed. A 15.5 French tunneled PleurX catheter was placed. This was tunneled from an incision 5 cm below the peritoneal access to the access site. The catheter was advanced through the peel-away sheath. The sheath was then removed. Final catheter positioning was confirmed with a fluoroscopic spot image. The peritoneal access incision was closed with subcutaneous 3-0 Monocryl and subcuticular 4-0 Vicryl. Dermabond was applied to the incision. A Prolene retention suture was applied at the catheter exit site. Large volume paracentesis was performed through the new catheter utilizing drainage bottles. COMPLICATIONS: None. FINDINGS: The catheter was placed via the right abdominal wall. Approximately 4.8 liters of ascites was able to be removed after catheter placement. IMPRESSION: Placement of tunneled peritoneal drainage catheter via right abdominal approach. 4.8 liters of ascites was removed today after catheter placement. Electronically Signed   By: Aletta Edouard M.D.   On: 02/18/2017 13:15   Dg Chest Port 1 View  Result Date: 01/31/2017 CLINICAL DATA:  Worsening shortness of breath. EXAM: PORTABLE CHEST 1 VIEW COMPARISON:  01/19/2017 FINDINGS: Low volume chest  with bandlike opacities at both bases. No edema, effusion, or pneumothorax. Normal heart size and mediastinal contours. Previous median sternotomy IMPRESSION: Low volume chest with bilateral atelectasis. Electronically Signed   By: Monte Fantasia M.D.   On: 01/31/2017 15:25   Ir Paracentesis  Result Date: 02/01/2017 INDICATION: H/O NASH wtih recurrent abdominal ascites. Request for therapeutic paracentesis. EXAM: ULTRASOUND GUIDED THERAPEUTIC PARACENTESIS MEDICATIONS: 2% lidocaine COMPLICATIONS: None immediate. PROCEDURE: Informed written consent was obtained from the patient after a discussion of the risks, benefits and alternatives to treatment. A timeout was performed prior to the initiation of the procedure. Initial ultrasound scanning demonstrates a large amount of  ascites within the right lower abdominal quadrant. The right lower abdomen was prepped and draped in the usual sterile fashion. 2% lidocaine was used for local anesthesia. Following this, a 19 gauge, 7-cm, Yueh catheter was introduced. An ultrasound image was saved for documentation purposes. The paracentesis was performed. The catheter was removed and a dressing was applied. The patient tolerated the procedure well without immediate post procedural complication. FINDINGS: A total of approximately 4.9 L of chylous fluid was removed. IMPRESSION: Successful ultrasound-guided paracentesis yielding 4.9 liters of peritoneal fluid. The patient received 50 g of albumin during the procedure. Unfortunately, the procedure had to be terminated after the above mentioned amounts secondary to hypotension. Read by: Saverio Danker, PA-C Electronically Signed   By: Jerilynn Mages.  Shick M.D.   On: 02/01/2017 13:53    Discharge Exam: Vitals:   02/18/17 1105 02/18/17 1432  BP: 103/61 (!) 92/49  Pulse: 99 100  Resp: 12 15  Temp:  98.4 F (36.9 C)  SpO2: 100% 100%   Vitals:   02/18/17 1050 02/18/17 1055 02/18/17 1105 02/18/17 1432  BP: 109/75 109/65 103/61 (!)  92/49  Pulse: (!) 101 (!) 101 99 100  Resp: 11 12 12 15   Temp:    98.4 F (36.9 C)  TempSrc:    Oral  SpO2: 100% 100% 100% 100%  Weight:      Height:        General: NAD  Cardiovascular: RRR, S1/S2 +, no rubs, no gallops Respiratory: CTA bilaterally, no wheezing, no rhonchi Abdominal: Soft, mild distended, pleurX cath in RLQ, mild tenderness at surgical site  Extremities: no edema   The results of significant diagnostics from this hospitalization (including imaging, microbiology, ancillary and laboratory) are listed below for reference.     Microbiology: No results found for this or any previous visit (from the past 240 hour(s)).   Labs: BNP (last 3 results) Recent Labs    06/25/16 1658 11/19/16 1500 02/07/17 1400  BNP 148.4* 92.4 17.0   Basic Metabolic Panel: Recent Labs  Lab 02/17/17 1015 02/18/17 0350  NA 128* 132*  K 4.8 4.6  CL 100* 103  CO2 21* 22  GLUCOSE 345* 186*  BUN 61* 57*  CREATININE 2.17* 2.12*  CALCIUM 8.2* 8.5*   Liver Function Tests: Recent Labs  Lab 02/17/17 1015  AST 21  ALT 17  ALKPHOS 80  BILITOT 1.0  PROT 5.7*  ALBUMIN 2.7*   No results for input(s): LIPASE, AMYLASE in the last 168 hours. No results for input(s): AMMONIA in the last 168 hours. CBC: Recent Labs  Lab 02/17/17 1015 02/18/17 0350  WBC 6.8 5.9  NEUTROABS 5.0 3.7  HGB 10.6* 10.5*  HCT 32.3* 31.1*  MCV 89.0 88.6  PLT 284 270   Cardiac Enzymes: No results for input(s): CKTOTAL, CKMB, CKMBINDEX, TROPONINI in the last 168 hours. BNP: Invalid input(s): POCBNP CBG: Recent Labs  Lab 02/17/17 2139 02/18/17 0805 02/18/17 1245  GLUCAP 360* 110* 126*   D-Dimer No results for input(s): DDIMER in the last 72 hours. Hgb A1c No results for input(s): HGBA1C in the last 72 hours. Lipid Profile No results for input(s): CHOL, HDL, LDLCALC, TRIG, CHOLHDL, LDLDIRECT in the last 72 hours. Thyroid function studies No results for input(s): TSH, T4TOTAL, T3FREE,  THYROIDAB in the last 72 hours.  Invalid input(s): FREET3 Anemia work up No results for input(s): VITAMINB12, FOLATE, FERRITIN, TIBC, IRON, RETICCTPCT in the last 72 hours. Urinalysis    Component Value Date/Time   COLORURINE AMBER (A)  02/07/2017 1401   APPEARANCEUR CLEAR 02/07/2017 1401   LABSPEC 1.013 02/07/2017 1401   PHURINE 5.0 02/07/2017 1401   GLUCOSEU NEGATIVE 02/07/2017 1401   HGBUR NEGATIVE 02/07/2017 1401   BILIRUBINUR NEGATIVE 02/07/2017 1401   KETONESUR NEGATIVE 02/07/2017 1401   PROTEINUR NEGATIVE 02/07/2017 1401   NITRITE NEGATIVE 02/07/2017 1401   LEUKOCYTESUR NEGATIVE 02/07/2017 1401   Sepsis Labs Invalid input(s): PROCALCITONIN,  WBC,  LACTICIDVEN Microbiology No results found for this or any previous visit (from the past 240 hour(s)).   Time coordinating discharge: 30 minutes  SIGNED:  Chipper Oman, MD  [facility] 02/18/2017, 4:24 PM  Pager please text page via  [email or web address]

## 2017-02-19 DIAGNOSIS — R188 Other ascites: Secondary | ICD-10-CM | POA: Diagnosis not present

## 2017-02-19 DIAGNOSIS — K7291 Hepatic failure, unspecified with coma: Secondary | ICD-10-CM | POA: Diagnosis not present

## 2017-02-19 DIAGNOSIS — I10 Essential (primary) hypertension: Secondary | ICD-10-CM | POA: Diagnosis not present

## 2017-02-19 DIAGNOSIS — K7460 Unspecified cirrhosis of liver: Secondary | ICD-10-CM | POA: Diagnosis not present

## 2017-02-19 DIAGNOSIS — I4891 Unspecified atrial fibrillation: Secondary | ICD-10-CM | POA: Diagnosis not present

## 2017-02-19 DIAGNOSIS — I8500 Esophageal varices without bleeding: Secondary | ICD-10-CM | POA: Diagnosis not present

## 2017-02-19 DIAGNOSIS — E119 Type 2 diabetes mellitus without complications: Secondary | ICD-10-CM | POA: Diagnosis not present

## 2017-02-19 DIAGNOSIS — N189 Chronic kidney disease, unspecified: Secondary | ICD-10-CM | POA: Diagnosis not present

## 2017-02-19 DIAGNOSIS — K219 Gastro-esophageal reflux disease without esophagitis: Secondary | ICD-10-CM | POA: Diagnosis not present

## 2017-02-21 DIAGNOSIS — I4891 Unspecified atrial fibrillation: Secondary | ICD-10-CM | POA: Diagnosis not present

## 2017-02-21 DIAGNOSIS — E119 Type 2 diabetes mellitus without complications: Secondary | ICD-10-CM | POA: Diagnosis not present

## 2017-02-21 DIAGNOSIS — K7460 Unspecified cirrhosis of liver: Secondary | ICD-10-CM | POA: Diagnosis not present

## 2017-02-21 DIAGNOSIS — R188 Other ascites: Secondary | ICD-10-CM | POA: Diagnosis not present

## 2017-02-21 DIAGNOSIS — I10 Essential (primary) hypertension: Secondary | ICD-10-CM | POA: Diagnosis not present

## 2017-02-21 DIAGNOSIS — K7291 Hepatic failure, unspecified with coma: Secondary | ICD-10-CM | POA: Diagnosis not present

## 2017-02-23 DIAGNOSIS — K7460 Unspecified cirrhosis of liver: Secondary | ICD-10-CM | POA: Diagnosis not present

## 2017-02-23 DIAGNOSIS — I10 Essential (primary) hypertension: Secondary | ICD-10-CM | POA: Diagnosis not present

## 2017-02-23 DIAGNOSIS — K7291 Hepatic failure, unspecified with coma: Secondary | ICD-10-CM | POA: Diagnosis not present

## 2017-02-23 DIAGNOSIS — I4891 Unspecified atrial fibrillation: Secondary | ICD-10-CM | POA: Diagnosis not present

## 2017-02-23 DIAGNOSIS — R188 Other ascites: Secondary | ICD-10-CM | POA: Diagnosis not present

## 2017-02-23 DIAGNOSIS — E119 Type 2 diabetes mellitus without complications: Secondary | ICD-10-CM | POA: Diagnosis not present

## 2017-02-24 DIAGNOSIS — R188 Other ascites: Secondary | ICD-10-CM | POA: Diagnosis not present

## 2017-02-24 DIAGNOSIS — K7460 Unspecified cirrhosis of liver: Secondary | ICD-10-CM | POA: Diagnosis not present

## 2017-02-24 DIAGNOSIS — K7291 Hepatic failure, unspecified with coma: Secondary | ICD-10-CM | POA: Diagnosis not present

## 2017-02-24 DIAGNOSIS — I4891 Unspecified atrial fibrillation: Secondary | ICD-10-CM | POA: Diagnosis not present

## 2017-02-24 DIAGNOSIS — I10 Essential (primary) hypertension: Secondary | ICD-10-CM | POA: Diagnosis not present

## 2017-02-24 DIAGNOSIS — E119 Type 2 diabetes mellitus without complications: Secondary | ICD-10-CM | POA: Diagnosis not present

## 2017-02-25 DIAGNOSIS — I4891 Unspecified atrial fibrillation: Secondary | ICD-10-CM | POA: Diagnosis not present

## 2017-02-25 DIAGNOSIS — I10 Essential (primary) hypertension: Secondary | ICD-10-CM | POA: Diagnosis not present

## 2017-02-25 DIAGNOSIS — R188 Other ascites: Secondary | ICD-10-CM | POA: Diagnosis not present

## 2017-02-25 DIAGNOSIS — K7460 Unspecified cirrhosis of liver: Secondary | ICD-10-CM | POA: Diagnosis not present

## 2017-02-25 DIAGNOSIS — K7291 Hepatic failure, unspecified with coma: Secondary | ICD-10-CM | POA: Diagnosis not present

## 2017-02-25 DIAGNOSIS — E119 Type 2 diabetes mellitus without complications: Secondary | ICD-10-CM | POA: Diagnosis not present

## 2017-02-28 DIAGNOSIS — R188 Other ascites: Secondary | ICD-10-CM | POA: Diagnosis not present

## 2017-02-28 DIAGNOSIS — K7460 Unspecified cirrhosis of liver: Secondary | ICD-10-CM | POA: Diagnosis not present

## 2017-02-28 DIAGNOSIS — I4891 Unspecified atrial fibrillation: Secondary | ICD-10-CM | POA: Diagnosis not present

## 2017-02-28 DIAGNOSIS — K7291 Hepatic failure, unspecified with coma: Secondary | ICD-10-CM | POA: Diagnosis not present

## 2017-02-28 DIAGNOSIS — E119 Type 2 diabetes mellitus without complications: Secondary | ICD-10-CM | POA: Diagnosis not present

## 2017-02-28 DIAGNOSIS — I10 Essential (primary) hypertension: Secondary | ICD-10-CM | POA: Diagnosis not present

## 2017-03-01 DIAGNOSIS — E119 Type 2 diabetes mellitus without complications: Secondary | ICD-10-CM | POA: Diagnosis not present

## 2017-03-01 DIAGNOSIS — I4891 Unspecified atrial fibrillation: Secondary | ICD-10-CM | POA: Diagnosis not present

## 2017-03-01 DIAGNOSIS — R188 Other ascites: Secondary | ICD-10-CM | POA: Diagnosis not present

## 2017-03-01 DIAGNOSIS — I10 Essential (primary) hypertension: Secondary | ICD-10-CM | POA: Diagnosis not present

## 2017-03-01 DIAGNOSIS — K7291 Hepatic failure, unspecified with coma: Secondary | ICD-10-CM | POA: Diagnosis not present

## 2017-03-01 DIAGNOSIS — K7460 Unspecified cirrhosis of liver: Secondary | ICD-10-CM | POA: Diagnosis not present

## 2017-03-02 DIAGNOSIS — I4891 Unspecified atrial fibrillation: Secondary | ICD-10-CM | POA: Diagnosis not present

## 2017-03-02 DIAGNOSIS — E119 Type 2 diabetes mellitus without complications: Secondary | ICD-10-CM | POA: Diagnosis not present

## 2017-03-02 DIAGNOSIS — K7460 Unspecified cirrhosis of liver: Secondary | ICD-10-CM | POA: Diagnosis not present

## 2017-03-02 DIAGNOSIS — R188 Other ascites: Secondary | ICD-10-CM | POA: Diagnosis not present

## 2017-03-02 DIAGNOSIS — K7291 Hepatic failure, unspecified with coma: Secondary | ICD-10-CM | POA: Diagnosis not present

## 2017-03-02 DIAGNOSIS — I10 Essential (primary) hypertension: Secondary | ICD-10-CM | POA: Diagnosis not present

## 2017-03-04 DIAGNOSIS — R188 Other ascites: Secondary | ICD-10-CM | POA: Diagnosis not present

## 2017-03-04 DIAGNOSIS — I10 Essential (primary) hypertension: Secondary | ICD-10-CM | POA: Diagnosis not present

## 2017-03-04 DIAGNOSIS — I4891 Unspecified atrial fibrillation: Secondary | ICD-10-CM | POA: Diagnosis not present

## 2017-03-04 DIAGNOSIS — K7460 Unspecified cirrhosis of liver: Secondary | ICD-10-CM | POA: Diagnosis not present

## 2017-03-04 DIAGNOSIS — K7291 Hepatic failure, unspecified with coma: Secondary | ICD-10-CM | POA: Diagnosis not present

## 2017-03-04 DIAGNOSIS — E119 Type 2 diabetes mellitus without complications: Secondary | ICD-10-CM | POA: Diagnosis not present

## 2017-03-06 DIAGNOSIS — I4891 Unspecified atrial fibrillation: Secondary | ICD-10-CM | POA: Diagnosis not present

## 2017-03-06 DIAGNOSIS — E119 Type 2 diabetes mellitus without complications: Secondary | ICD-10-CM | POA: Diagnosis not present

## 2017-03-06 DIAGNOSIS — I10 Essential (primary) hypertension: Secondary | ICD-10-CM | POA: Diagnosis not present

## 2017-03-06 DIAGNOSIS — K7291 Hepatic failure, unspecified with coma: Secondary | ICD-10-CM | POA: Diagnosis not present

## 2017-03-06 DIAGNOSIS — R188 Other ascites: Secondary | ICD-10-CM | POA: Diagnosis not present

## 2017-03-06 DIAGNOSIS — K7460 Unspecified cirrhosis of liver: Secondary | ICD-10-CM | POA: Diagnosis not present

## 2017-03-07 DIAGNOSIS — E119 Type 2 diabetes mellitus without complications: Secondary | ICD-10-CM | POA: Diagnosis not present

## 2017-03-07 DIAGNOSIS — R188 Other ascites: Secondary | ICD-10-CM | POA: Diagnosis not present

## 2017-03-07 DIAGNOSIS — I10 Essential (primary) hypertension: Secondary | ICD-10-CM | POA: Diagnosis not present

## 2017-03-07 DIAGNOSIS — K7291 Hepatic failure, unspecified with coma: Secondary | ICD-10-CM | POA: Diagnosis not present

## 2017-03-07 DIAGNOSIS — I4891 Unspecified atrial fibrillation: Secondary | ICD-10-CM | POA: Diagnosis not present

## 2017-03-07 DIAGNOSIS — K7460 Unspecified cirrhosis of liver: Secondary | ICD-10-CM | POA: Diagnosis not present

## 2017-03-08 DIAGNOSIS — K7460 Unspecified cirrhosis of liver: Secondary | ICD-10-CM | POA: Diagnosis not present

## 2017-03-08 DIAGNOSIS — R188 Other ascites: Secondary | ICD-10-CM | POA: Diagnosis not present

## 2017-03-08 DIAGNOSIS — K7291 Hepatic failure, unspecified with coma: Secondary | ICD-10-CM | POA: Diagnosis not present

## 2017-03-08 DIAGNOSIS — I4891 Unspecified atrial fibrillation: Secondary | ICD-10-CM | POA: Diagnosis not present

## 2017-03-08 DIAGNOSIS — I10 Essential (primary) hypertension: Secondary | ICD-10-CM | POA: Diagnosis not present

## 2017-03-08 DIAGNOSIS — E119 Type 2 diabetes mellitus without complications: Secondary | ICD-10-CM | POA: Diagnosis not present

## 2017-03-10 DIAGNOSIS — K7291 Hepatic failure, unspecified with coma: Secondary | ICD-10-CM | POA: Diagnosis not present

## 2017-03-10 DIAGNOSIS — E119 Type 2 diabetes mellitus without complications: Secondary | ICD-10-CM | POA: Diagnosis not present

## 2017-03-10 DIAGNOSIS — R188 Other ascites: Secondary | ICD-10-CM | POA: Diagnosis not present

## 2017-03-10 DIAGNOSIS — I10 Essential (primary) hypertension: Secondary | ICD-10-CM | POA: Diagnosis not present

## 2017-03-10 DIAGNOSIS — K7460 Unspecified cirrhosis of liver: Secondary | ICD-10-CM | POA: Diagnosis not present

## 2017-03-10 DIAGNOSIS — I4891 Unspecified atrial fibrillation: Secondary | ICD-10-CM | POA: Diagnosis not present

## 2017-03-11 DIAGNOSIS — I4891 Unspecified atrial fibrillation: Secondary | ICD-10-CM | POA: Diagnosis not present

## 2017-03-11 DIAGNOSIS — I10 Essential (primary) hypertension: Secondary | ICD-10-CM | POA: Diagnosis not present

## 2017-03-11 DIAGNOSIS — K7291 Hepatic failure, unspecified with coma: Secondary | ICD-10-CM | POA: Diagnosis not present

## 2017-03-11 DIAGNOSIS — R188 Other ascites: Secondary | ICD-10-CM | POA: Diagnosis not present

## 2017-03-11 DIAGNOSIS — E119 Type 2 diabetes mellitus without complications: Secondary | ICD-10-CM | POA: Diagnosis not present

## 2017-03-11 DIAGNOSIS — K7460 Unspecified cirrhosis of liver: Secondary | ICD-10-CM | POA: Diagnosis not present

## 2017-03-14 ENCOUNTER — Ambulatory Visit: Payer: Self-pay | Admitting: Internal Medicine

## 2017-03-14 DIAGNOSIS — I4891 Unspecified atrial fibrillation: Secondary | ICD-10-CM | POA: Diagnosis not present

## 2017-03-14 DIAGNOSIS — Z0289 Encounter for other administrative examinations: Secondary | ICD-10-CM

## 2017-03-14 DIAGNOSIS — E119 Type 2 diabetes mellitus without complications: Secondary | ICD-10-CM | POA: Diagnosis not present

## 2017-03-14 DIAGNOSIS — K7291 Hepatic failure, unspecified with coma: Secondary | ICD-10-CM | POA: Diagnosis not present

## 2017-03-14 DIAGNOSIS — R188 Other ascites: Secondary | ICD-10-CM | POA: Diagnosis not present

## 2017-03-14 DIAGNOSIS — I10 Essential (primary) hypertension: Secondary | ICD-10-CM | POA: Diagnosis not present

## 2017-03-14 DIAGNOSIS — K7460 Unspecified cirrhosis of liver: Secondary | ICD-10-CM | POA: Diagnosis not present

## 2017-03-15 DIAGNOSIS — K7291 Hepatic failure, unspecified with coma: Secondary | ICD-10-CM | POA: Diagnosis not present

## 2017-03-15 DIAGNOSIS — I10 Essential (primary) hypertension: Secondary | ICD-10-CM | POA: Diagnosis not present

## 2017-03-15 DIAGNOSIS — K7460 Unspecified cirrhosis of liver: Secondary | ICD-10-CM | POA: Diagnosis not present

## 2017-03-15 DIAGNOSIS — E119 Type 2 diabetes mellitus without complications: Secondary | ICD-10-CM | POA: Diagnosis not present

## 2017-03-15 DIAGNOSIS — I4891 Unspecified atrial fibrillation: Secondary | ICD-10-CM | POA: Diagnosis not present

## 2017-03-15 DIAGNOSIS — R188 Other ascites: Secondary | ICD-10-CM | POA: Diagnosis not present

## 2017-03-16 DIAGNOSIS — I4891 Unspecified atrial fibrillation: Secondary | ICD-10-CM | POA: Diagnosis not present

## 2017-03-16 DIAGNOSIS — K7460 Unspecified cirrhosis of liver: Secondary | ICD-10-CM | POA: Diagnosis not present

## 2017-03-16 DIAGNOSIS — I10 Essential (primary) hypertension: Secondary | ICD-10-CM | POA: Diagnosis not present

## 2017-03-16 DIAGNOSIS — K7291 Hepatic failure, unspecified with coma: Secondary | ICD-10-CM | POA: Diagnosis not present

## 2017-03-16 DIAGNOSIS — E119 Type 2 diabetes mellitus without complications: Secondary | ICD-10-CM | POA: Diagnosis not present

## 2017-03-16 DIAGNOSIS — R188 Other ascites: Secondary | ICD-10-CM | POA: Diagnosis not present

## 2017-03-17 DIAGNOSIS — I10 Essential (primary) hypertension: Secondary | ICD-10-CM | POA: Diagnosis not present

## 2017-03-17 DIAGNOSIS — I4891 Unspecified atrial fibrillation: Secondary | ICD-10-CM | POA: Diagnosis not present

## 2017-03-17 DIAGNOSIS — K7291 Hepatic failure, unspecified with coma: Secondary | ICD-10-CM | POA: Diagnosis not present

## 2017-03-17 DIAGNOSIS — K7460 Unspecified cirrhosis of liver: Secondary | ICD-10-CM | POA: Diagnosis not present

## 2017-03-17 DIAGNOSIS — R188 Other ascites: Secondary | ICD-10-CM | POA: Diagnosis not present

## 2017-03-17 DIAGNOSIS — E119 Type 2 diabetes mellitus without complications: Secondary | ICD-10-CM | POA: Diagnosis not present

## 2017-03-18 DIAGNOSIS — R188 Other ascites: Secondary | ICD-10-CM | POA: Diagnosis not present

## 2017-03-18 DIAGNOSIS — E119 Type 2 diabetes mellitus without complications: Secondary | ICD-10-CM | POA: Diagnosis not present

## 2017-03-18 DIAGNOSIS — I10 Essential (primary) hypertension: Secondary | ICD-10-CM | POA: Diagnosis not present

## 2017-03-18 DIAGNOSIS — N189 Chronic kidney disease, unspecified: Secondary | ICD-10-CM | POA: Diagnosis not present

## 2017-03-18 DIAGNOSIS — K219 Gastro-esophageal reflux disease without esophagitis: Secondary | ICD-10-CM | POA: Diagnosis not present

## 2017-03-18 DIAGNOSIS — I8500 Esophageal varices without bleeding: Secondary | ICD-10-CM | POA: Diagnosis not present

## 2017-03-18 DIAGNOSIS — I4891 Unspecified atrial fibrillation: Secondary | ICD-10-CM | POA: Diagnosis not present

## 2017-03-18 DIAGNOSIS — K7291 Hepatic failure, unspecified with coma: Secondary | ICD-10-CM | POA: Diagnosis not present

## 2017-03-18 DIAGNOSIS — K7460 Unspecified cirrhosis of liver: Secondary | ICD-10-CM | POA: Diagnosis not present

## 2017-03-21 DIAGNOSIS — I10 Essential (primary) hypertension: Secondary | ICD-10-CM | POA: Diagnosis not present

## 2017-03-21 DIAGNOSIS — K7291 Hepatic failure, unspecified with coma: Secondary | ICD-10-CM | POA: Diagnosis not present

## 2017-03-21 DIAGNOSIS — I4891 Unspecified atrial fibrillation: Secondary | ICD-10-CM | POA: Diagnosis not present

## 2017-03-21 DIAGNOSIS — E119 Type 2 diabetes mellitus without complications: Secondary | ICD-10-CM | POA: Diagnosis not present

## 2017-03-21 DIAGNOSIS — K7460 Unspecified cirrhosis of liver: Secondary | ICD-10-CM | POA: Diagnosis not present

## 2017-03-21 DIAGNOSIS — R188 Other ascites: Secondary | ICD-10-CM | POA: Diagnosis not present

## 2017-03-22 DIAGNOSIS — R188 Other ascites: Secondary | ICD-10-CM | POA: Diagnosis not present

## 2017-03-22 DIAGNOSIS — K7460 Unspecified cirrhosis of liver: Secondary | ICD-10-CM | POA: Diagnosis not present

## 2017-03-22 DIAGNOSIS — I4891 Unspecified atrial fibrillation: Secondary | ICD-10-CM | POA: Diagnosis not present

## 2017-03-22 DIAGNOSIS — K7291 Hepatic failure, unspecified with coma: Secondary | ICD-10-CM | POA: Diagnosis not present

## 2017-03-22 DIAGNOSIS — E119 Type 2 diabetes mellitus without complications: Secondary | ICD-10-CM | POA: Diagnosis not present

## 2017-03-22 DIAGNOSIS — I10 Essential (primary) hypertension: Secondary | ICD-10-CM | POA: Diagnosis not present

## 2017-03-23 DIAGNOSIS — E119 Type 2 diabetes mellitus without complications: Secondary | ICD-10-CM | POA: Diagnosis not present

## 2017-03-23 DIAGNOSIS — K7291 Hepatic failure, unspecified with coma: Secondary | ICD-10-CM | POA: Diagnosis not present

## 2017-03-23 DIAGNOSIS — R188 Other ascites: Secondary | ICD-10-CM | POA: Diagnosis not present

## 2017-03-23 DIAGNOSIS — I10 Essential (primary) hypertension: Secondary | ICD-10-CM | POA: Diagnosis not present

## 2017-03-23 DIAGNOSIS — K7460 Unspecified cirrhosis of liver: Secondary | ICD-10-CM | POA: Diagnosis not present

## 2017-03-23 DIAGNOSIS — I4891 Unspecified atrial fibrillation: Secondary | ICD-10-CM | POA: Diagnosis not present

## 2017-03-25 DIAGNOSIS — K7291 Hepatic failure, unspecified with coma: Secondary | ICD-10-CM | POA: Diagnosis not present

## 2017-03-25 DIAGNOSIS — I10 Essential (primary) hypertension: Secondary | ICD-10-CM | POA: Diagnosis not present

## 2017-03-25 DIAGNOSIS — K7460 Unspecified cirrhosis of liver: Secondary | ICD-10-CM | POA: Diagnosis not present

## 2017-03-25 DIAGNOSIS — I4891 Unspecified atrial fibrillation: Secondary | ICD-10-CM | POA: Diagnosis not present

## 2017-03-25 DIAGNOSIS — R188 Other ascites: Secondary | ICD-10-CM | POA: Diagnosis not present

## 2017-03-25 DIAGNOSIS — E119 Type 2 diabetes mellitus without complications: Secondary | ICD-10-CM | POA: Diagnosis not present

## 2017-03-27 DIAGNOSIS — R188 Other ascites: Secondary | ICD-10-CM | POA: Diagnosis not present

## 2017-03-27 DIAGNOSIS — I4891 Unspecified atrial fibrillation: Secondary | ICD-10-CM | POA: Diagnosis not present

## 2017-03-27 DIAGNOSIS — K7460 Unspecified cirrhosis of liver: Secondary | ICD-10-CM | POA: Diagnosis not present

## 2017-03-27 DIAGNOSIS — E119 Type 2 diabetes mellitus without complications: Secondary | ICD-10-CM | POA: Diagnosis not present

## 2017-03-27 DIAGNOSIS — K7291 Hepatic failure, unspecified with coma: Secondary | ICD-10-CM | POA: Diagnosis not present

## 2017-03-27 DIAGNOSIS — I10 Essential (primary) hypertension: Secondary | ICD-10-CM | POA: Diagnosis not present

## 2017-03-28 DIAGNOSIS — I4891 Unspecified atrial fibrillation: Secondary | ICD-10-CM | POA: Diagnosis not present

## 2017-03-28 DIAGNOSIS — K7460 Unspecified cirrhosis of liver: Secondary | ICD-10-CM | POA: Diagnosis not present

## 2017-03-28 DIAGNOSIS — I10 Essential (primary) hypertension: Secondary | ICD-10-CM | POA: Diagnosis not present

## 2017-03-28 DIAGNOSIS — E119 Type 2 diabetes mellitus without complications: Secondary | ICD-10-CM | POA: Diagnosis not present

## 2017-03-28 DIAGNOSIS — R188 Other ascites: Secondary | ICD-10-CM | POA: Diagnosis not present

## 2017-03-28 DIAGNOSIS — K7291 Hepatic failure, unspecified with coma: Secondary | ICD-10-CM | POA: Diagnosis not present

## 2017-03-29 DIAGNOSIS — R188 Other ascites: Secondary | ICD-10-CM | POA: Diagnosis not present

## 2017-03-29 DIAGNOSIS — E119 Type 2 diabetes mellitus without complications: Secondary | ICD-10-CM | POA: Diagnosis not present

## 2017-03-29 DIAGNOSIS — K7291 Hepatic failure, unspecified with coma: Secondary | ICD-10-CM | POA: Diagnosis not present

## 2017-03-29 DIAGNOSIS — I10 Essential (primary) hypertension: Secondary | ICD-10-CM | POA: Diagnosis not present

## 2017-03-29 DIAGNOSIS — K7460 Unspecified cirrhosis of liver: Secondary | ICD-10-CM | POA: Diagnosis not present

## 2017-03-29 DIAGNOSIS — I4891 Unspecified atrial fibrillation: Secondary | ICD-10-CM | POA: Diagnosis not present

## 2017-03-30 DIAGNOSIS — I4891 Unspecified atrial fibrillation: Secondary | ICD-10-CM | POA: Diagnosis not present

## 2017-03-30 DIAGNOSIS — I10 Essential (primary) hypertension: Secondary | ICD-10-CM | POA: Diagnosis not present

## 2017-03-30 DIAGNOSIS — K7460 Unspecified cirrhosis of liver: Secondary | ICD-10-CM | POA: Diagnosis not present

## 2017-03-30 DIAGNOSIS — K7291 Hepatic failure, unspecified with coma: Secondary | ICD-10-CM | POA: Diagnosis not present

## 2017-03-30 DIAGNOSIS — E119 Type 2 diabetes mellitus without complications: Secondary | ICD-10-CM | POA: Diagnosis not present

## 2017-03-30 DIAGNOSIS — R188 Other ascites: Secondary | ICD-10-CM | POA: Diagnosis not present

## 2017-04-01 DIAGNOSIS — E119 Type 2 diabetes mellitus without complications: Secondary | ICD-10-CM | POA: Diagnosis not present

## 2017-04-01 DIAGNOSIS — I10 Essential (primary) hypertension: Secondary | ICD-10-CM | POA: Diagnosis not present

## 2017-04-01 DIAGNOSIS — R188 Other ascites: Secondary | ICD-10-CM | POA: Diagnosis not present

## 2017-04-01 DIAGNOSIS — I4891 Unspecified atrial fibrillation: Secondary | ICD-10-CM | POA: Diagnosis not present

## 2017-04-01 DIAGNOSIS — K7460 Unspecified cirrhosis of liver: Secondary | ICD-10-CM | POA: Diagnosis not present

## 2017-04-01 DIAGNOSIS — K7291 Hepatic failure, unspecified with coma: Secondary | ICD-10-CM | POA: Diagnosis not present

## 2017-04-02 DIAGNOSIS — K7460 Unspecified cirrhosis of liver: Secondary | ICD-10-CM | POA: Diagnosis not present

## 2017-04-02 DIAGNOSIS — I10 Essential (primary) hypertension: Secondary | ICD-10-CM | POA: Diagnosis not present

## 2017-04-02 DIAGNOSIS — R188 Other ascites: Secondary | ICD-10-CM | POA: Diagnosis not present

## 2017-04-02 DIAGNOSIS — E119 Type 2 diabetes mellitus without complications: Secondary | ICD-10-CM | POA: Diagnosis not present

## 2017-04-02 DIAGNOSIS — K7291 Hepatic failure, unspecified with coma: Secondary | ICD-10-CM | POA: Diagnosis not present

## 2017-04-02 DIAGNOSIS — I4891 Unspecified atrial fibrillation: Secondary | ICD-10-CM | POA: Diagnosis not present

## 2017-04-03 DIAGNOSIS — E119 Type 2 diabetes mellitus without complications: Secondary | ICD-10-CM | POA: Diagnosis not present

## 2017-04-03 DIAGNOSIS — K7460 Unspecified cirrhosis of liver: Secondary | ICD-10-CM | POA: Diagnosis not present

## 2017-04-03 DIAGNOSIS — I10 Essential (primary) hypertension: Secondary | ICD-10-CM | POA: Diagnosis not present

## 2017-04-03 DIAGNOSIS — R188 Other ascites: Secondary | ICD-10-CM | POA: Diagnosis not present

## 2017-04-03 DIAGNOSIS — K7291 Hepatic failure, unspecified with coma: Secondary | ICD-10-CM | POA: Diagnosis not present

## 2017-04-03 DIAGNOSIS — I4891 Unspecified atrial fibrillation: Secondary | ICD-10-CM | POA: Diagnosis not present

## 2017-04-04 ENCOUNTER — Emergency Department (HOSPITAL_COMMUNITY)

## 2017-04-04 ENCOUNTER — Inpatient Hospital Stay (HOSPITAL_COMMUNITY)
Admission: EM | Admit: 2017-04-04 | Discharge: 2017-04-15 | DRG: 682 | Disposition: E | Attending: Internal Medicine | Admitting: Internal Medicine

## 2017-04-04 ENCOUNTER — Other Ambulatory Visit: Payer: Self-pay

## 2017-04-04 ENCOUNTER — Encounter: Payer: Self-pay | Admitting: Adult Health

## 2017-04-04 ENCOUNTER — Encounter (HOSPITAL_COMMUNITY): Payer: Self-pay

## 2017-04-04 DIAGNOSIS — K7460 Unspecified cirrhosis of liver: Secondary | ICD-10-CM | POA: Diagnosis not present

## 2017-04-04 DIAGNOSIS — Z882 Allergy status to sulfonamides status: Secondary | ICD-10-CM

## 2017-04-04 DIAGNOSIS — I4891 Unspecified atrial fibrillation: Secondary | ICD-10-CM | POA: Diagnosis present

## 2017-04-04 DIAGNOSIS — Z515 Encounter for palliative care: Secondary | ICD-10-CM | POA: Diagnosis present

## 2017-04-04 DIAGNOSIS — E1142 Type 2 diabetes mellitus with diabetic polyneuropathy: Secondary | ICD-10-CM | POA: Diagnosis present

## 2017-04-04 DIAGNOSIS — E785 Hyperlipidemia, unspecified: Secondary | ICD-10-CM | POA: Diagnosis present

## 2017-04-04 DIAGNOSIS — F419 Anxiety disorder, unspecified: Secondary | ICD-10-CM | POA: Diagnosis present

## 2017-04-04 DIAGNOSIS — E871 Hypo-osmolality and hyponatremia: Secondary | ICD-10-CM | POA: Diagnosis present

## 2017-04-04 DIAGNOSIS — K7469 Other cirrhosis of liver: Secondary | ICD-10-CM | POA: Diagnosis present

## 2017-04-04 DIAGNOSIS — Z91048 Other nonmedicinal substance allergy status: Secondary | ICD-10-CM

## 2017-04-04 DIAGNOSIS — G9341 Metabolic encephalopathy: Secondary | ICD-10-CM | POA: Diagnosis present

## 2017-04-04 DIAGNOSIS — I5032 Chronic diastolic (congestive) heart failure: Secondary | ICD-10-CM | POA: Diagnosis present

## 2017-04-04 DIAGNOSIS — I4892 Unspecified atrial flutter: Secondary | ICD-10-CM | POA: Diagnosis present

## 2017-04-04 DIAGNOSIS — R188 Other ascites: Secondary | ICD-10-CM | POA: Diagnosis not present

## 2017-04-04 DIAGNOSIS — Z90710 Acquired absence of both cervix and uterus: Secondary | ICD-10-CM | POA: Diagnosis not present

## 2017-04-04 DIAGNOSIS — Z818 Family history of other mental and behavioral disorders: Secondary | ICD-10-CM | POA: Diagnosis not present

## 2017-04-04 DIAGNOSIS — N179 Acute kidney failure, unspecified: Principal | ICD-10-CM | POA: Diagnosis present

## 2017-04-04 DIAGNOSIS — K767 Hepatorenal syndrome: Secondary | ICD-10-CM | POA: Diagnosis present

## 2017-04-04 DIAGNOSIS — R4020 Unspecified coma: Secondary | ICD-10-CM | POA: Diagnosis not present

## 2017-04-04 DIAGNOSIS — F411 Generalized anxiety disorder: Secondary | ICD-10-CM

## 2017-04-04 DIAGNOSIS — E1122 Type 2 diabetes mellitus with diabetic chronic kidney disease: Secondary | ICD-10-CM | POA: Diagnosis present

## 2017-04-04 DIAGNOSIS — S299XXA Unspecified injury of thorax, initial encounter: Secondary | ICD-10-CM | POA: Diagnosis not present

## 2017-04-04 DIAGNOSIS — I10 Essential (primary) hypertension: Secondary | ICD-10-CM | POA: Diagnosis not present

## 2017-04-04 DIAGNOSIS — K7290 Hepatic failure, unspecified without coma: Secondary | ICD-10-CM | POA: Diagnosis present

## 2017-04-04 DIAGNOSIS — N184 Chronic kidney disease, stage 4 (severe): Secondary | ICD-10-CM | POA: Diagnosis present

## 2017-04-04 DIAGNOSIS — Z8249 Family history of ischemic heart disease and other diseases of the circulatory system: Secondary | ICD-10-CM

## 2017-04-04 DIAGNOSIS — Z833 Family history of diabetes mellitus: Secondary | ICD-10-CM | POA: Diagnosis not present

## 2017-04-04 DIAGNOSIS — E119 Type 2 diabetes mellitus without complications: Secondary | ICD-10-CM | POA: Diagnosis not present

## 2017-04-04 DIAGNOSIS — Z794 Long term (current) use of insulin: Secondary | ICD-10-CM

## 2017-04-04 DIAGNOSIS — Z881 Allergy status to other antibiotic agents status: Secondary | ICD-10-CM

## 2017-04-04 DIAGNOSIS — K7581 Nonalcoholic steatohepatitis (NASH): Secondary | ICD-10-CM | POA: Diagnosis present

## 2017-04-04 DIAGNOSIS — K7682 Hepatic encephalopathy: Secondary | ICD-10-CM

## 2017-04-04 DIAGNOSIS — F0390 Unspecified dementia without behavioral disturbance: Secondary | ICD-10-CM | POA: Diagnosis present

## 2017-04-04 DIAGNOSIS — T1490XA Injury, unspecified, initial encounter: Secondary | ICD-10-CM | POA: Diagnosis not present

## 2017-04-04 DIAGNOSIS — Z841 Family history of disorders of kidney and ureter: Secondary | ICD-10-CM

## 2017-04-04 DIAGNOSIS — S199XXA Unspecified injury of neck, initial encounter: Secondary | ICD-10-CM | POA: Diagnosis not present

## 2017-04-04 DIAGNOSIS — Z87891 Personal history of nicotine dependence: Secondary | ICD-10-CM | POA: Diagnosis not present

## 2017-04-04 DIAGNOSIS — Z66 Do not resuscitate: Secondary | ICD-10-CM | POA: Diagnosis present

## 2017-04-04 DIAGNOSIS — K219 Gastro-esophageal reflux disease without esophagitis: Secondary | ICD-10-CM | POA: Diagnosis present

## 2017-04-04 DIAGNOSIS — Z888 Allergy status to other drugs, medicaments and biological substances status: Secondary | ICD-10-CM

## 2017-04-04 DIAGNOSIS — K117 Disturbances of salivary secretion: Secondary | ICD-10-CM

## 2017-04-04 DIAGNOSIS — E875 Hyperkalemia: Secondary | ICD-10-CM | POA: Diagnosis not present

## 2017-04-04 DIAGNOSIS — S0990XA Unspecified injury of head, initial encounter: Secondary | ICD-10-CM | POA: Diagnosis not present

## 2017-04-04 DIAGNOSIS — E860 Dehydration: Secondary | ICD-10-CM | POA: Diagnosis present

## 2017-04-04 DIAGNOSIS — Z9049 Acquired absence of other specified parts of digestive tract: Secondary | ICD-10-CM

## 2017-04-04 DIAGNOSIS — D631 Anemia in chronic kidney disease: Secondary | ICD-10-CM | POA: Diagnosis present

## 2017-04-04 DIAGNOSIS — K7291 Hepatic failure, unspecified with coma: Secondary | ICD-10-CM | POA: Diagnosis not present

## 2017-04-04 DIAGNOSIS — S3993XA Unspecified injury of pelvis, initial encounter: Secondary | ICD-10-CM | POA: Diagnosis not present

## 2017-04-04 DIAGNOSIS — I864 Gastric varices: Secondary | ICD-10-CM | POA: Diagnosis present

## 2017-04-04 DIAGNOSIS — Z880 Allergy status to penicillin: Secondary | ICD-10-CM | POA: Diagnosis not present

## 2017-04-04 LAB — CBC WITH DIFFERENTIAL/PLATELET
BASOS ABS: 0 10*3/uL (ref 0.0–0.1)
BLASTS: 0 %
Band Neutrophils: 0 %
Basophils Relative: 0 %
Eosinophils Absolute: 0 10*3/uL (ref 0.0–0.7)
Eosinophils Relative: 0 %
HEMATOCRIT: 41 % (ref 36.0–46.0)
Hemoglobin: 13.6 g/dL (ref 12.0–15.0)
LYMPHS PCT: 3 %
Lymphs Abs: 0.4 10*3/uL — ABNORMAL LOW (ref 0.7–4.0)
MCH: 28.9 pg (ref 26.0–34.0)
MCHC: 33.2 g/dL (ref 30.0–36.0)
MCV: 87.2 fL (ref 78.0–100.0)
METAMYELOCYTES PCT: 0 %
MONOS PCT: 9 %
Monocytes Absolute: 1.3 10*3/uL — ABNORMAL HIGH (ref 0.1–1.0)
Myelocytes: 0 %
NEUTROS ABS: 13 10*3/uL — AB (ref 1.7–7.7)
Neutrophils Relative %: 88 %
Other: 0 %
Platelets: 295 10*3/uL (ref 150–400)
Promyelocytes Absolute: 0 %
RBC: 4.7 MIL/uL (ref 3.87–5.11)
RDW: 15 % (ref 11.5–15.5)
Smear Review: ADEQUATE
WBC: 14.7 10*3/uL — AB (ref 4.0–10.5)
nRBC: 0 /100 WBC

## 2017-04-04 LAB — URINALYSIS, ROUTINE W REFLEX MICROSCOPIC
BILIRUBIN URINE: NEGATIVE
Glucose, UA: NEGATIVE mg/dL
Hgb urine dipstick: NEGATIVE
KETONES UR: NEGATIVE mg/dL
LEUKOCYTES UA: NEGATIVE
NITRITE: NEGATIVE
Protein, ur: NEGATIVE mg/dL
Specific Gravity, Urine: 1.013 (ref 1.005–1.030)
pH: 5 (ref 5.0–8.0)

## 2017-04-04 LAB — COMPREHENSIVE METABOLIC PANEL
ALK PHOS: 93 U/L (ref 38–126)
ALT: 19 U/L (ref 14–54)
AST: 35 U/L (ref 15–41)
Albumin: 2.5 g/dL — ABNORMAL LOW (ref 3.5–5.0)
Anion gap: 16 — ABNORMAL HIGH (ref 5–15)
BILIRUBIN TOTAL: 1.2 mg/dL (ref 0.3–1.2)
BUN: 110 mg/dL — AB (ref 6–20)
CALCIUM: 9.1 mg/dL (ref 8.9–10.3)
CO2: 14 mmol/L — ABNORMAL LOW (ref 22–32)
CREATININE: 4.13 mg/dL — AB (ref 0.44–1.00)
Chloride: 100 mmol/L — ABNORMAL LOW (ref 101–111)
GFR calc Af Amer: 12 mL/min — ABNORMAL LOW (ref >60)
GFR calc non Af Amer: 10 mL/min — ABNORMAL LOW (ref >60)
Glucose, Bld: 292 mg/dL — ABNORMAL HIGH (ref 65–99)
Potassium: 6.9 mmol/L (ref 3.5–5.1)
Sodium: 130 mmol/L — ABNORMAL LOW (ref 135–145)
TOTAL PROTEIN: 6.4 g/dL — AB (ref 6.5–8.1)

## 2017-04-04 LAB — AMMONIA: AMMONIA: 115 umol/L — AB (ref 9–35)

## 2017-04-04 MED ORDER — MORPHINE SULFATE (PF) 4 MG/ML IV SOLN
1.0000 mg | INTRAVENOUS | Status: DC | PRN
  Administered 2017-04-05 (×2): 1 mg via INTRAVENOUS
  Filled 2017-04-04 (×2): qty 1

## 2017-04-04 MED ORDER — SODIUM CHLORIDE 0.9% FLUSH: 3.0000 mL | INTRAVENOUS | Status: DC | PRN

## 2017-04-04 MED ORDER — ONDANSETRON 4 MG PO TBDP: 4.0000 mg | ORAL_TABLET | Freq: Four times a day (QID) | ORAL | Status: DC | PRN

## 2017-04-04 MED ORDER — SODIUM CHLORIDE 0.9% FLUSH
3.0000 mL | Freq: Two times a day (BID) | INTRAVENOUS | Status: DC
  Administered 2017-04-04: 3 mL via INTRAVENOUS

## 2017-04-04 MED ORDER — LORAZEPAM 2 MG/ML PO CONC: 1.0000 mg | ORAL | Status: DC | PRN

## 2017-04-04 MED ORDER — SODIUM CHLORIDE 0.9 % IV SOLN: 250.0000 mL | INTRAVENOUS | Status: DC | PRN

## 2017-04-04 MED ORDER — ONDANSETRON HCL 4 MG/2ML IJ SOLN: 4.0000 mg | Freq: Four times a day (QID) | INTRAMUSCULAR | Status: DC | PRN

## 2017-04-04 MED ORDER — SODIUM CHLORIDE 0.9 % IV SOLN
1.0000 g | Freq: Once | INTRAVENOUS | Status: DC
  Filled 2017-04-04: qty 10

## 2017-04-04 MED ORDER — LORAZEPAM 2 MG/ML IJ SOLN: 1.0000 mg | INTRAMUSCULAR | Status: DC | PRN

## 2017-04-04 MED ORDER — LORAZEPAM 1 MG PO TABS: 1.0000 mg | ORAL_TABLET | ORAL | Status: DC | PRN

## 2017-04-04 MED ORDER — MORPHINE SULFATE (CONCENTRATE) 10 MG/0.5ML PO SOLN: 5.0000 mg | ORAL | Status: DC | PRN

## 2017-04-04 NOTE — ED Provider Notes (Signed)
[facility] EMERGENCY DEPARTMENT Provider Note   CSN: [id number] Arrival date & time: 04/09/2017  1857     History   Chief Complaint Chief Complaint  Patient presents with  . Fall    HPI Natalie Lane is a 72 y.o. female.  HPI  72 year old female in hospice/DNR secondary to idiopathic cirrhosis/chronic ascites, diabetes, congenital heart disease, heart failure, proximal small A. fib presents to the emergency department [facility] after an unwitnessed fall.  Facility was concerned about hip pain. Decline mental status x 7d. Marland Kitchen  EMS reports no appreciated deformity/change in functional status.   Past Medical History:  Diagnosis Date  . Anemia   . Ascites   . CHD (congenital heart disease)    a. 1979 s/p septal defect repair (? VSD vs ASD), old op notes not available;  b. 04/2016 Echo: EF 60-65%, no rwma, Gr2 DD, mild AS, mild TR, no PFO (prev repaired).  . Chronic diastolic CHF (congestive heart failure) ([place])    04/2016 Echo: EF 60-65%, no rwma, Gr2 DD.  Marland Kitchen Complication of anesthesia   . 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 04/2016 s/p paracentesis.  . Diabetes mellitus, type 2 ([place])   . Dyspnea   . Edema   . Gastric varices    a. s/p banding.  Marland Kitchen GERD (gastroesophageal reflux disease)   . History of blood transfusion   . Hyperlipidemia   . Normal stress echocardiogram 2008  . Pancreatic adenoma   . Paroxysmal atrial flutter ([place])    a. 11/2009 s/p RFCA.  Marland Kitchen PONV (postoperative nausea and vomiting)    with Cholecystectomy  . Tobacco abuse    a. smokes 4 cigarettes/day.    Patient Active Problem List   Diagnosis Date Noted  . Hyponatremia 04/08/2017  . Decompensation of cirrhosis of liver ([place]) 01/31/2017  . CKD (chronic kidney disease), stage IV ([place])   . Advance care planning   . Goals of care, counseling/discussion   . Palliative care by specialist   . Tobacco abuse   . PONV (postoperative nausea and vomiting)   . Paroxysmal  atrial flutter ([place])   . Pancreatic adenoma   . History of blood transfusion   . GERD (gastroesophageal reflux disease)   . Gastric varices   . Dyspnea   . Diabetes mellitus, type 2 ([place])   . 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 Complication of anesthesia   . Chronic diastolic CHF (congestive heart failure) ([place])   . Anemia   . SOB (shortness of breath)   . Hyperkalemia 09/02/2016  . Hypotension 09/02/2016  . Ascites of liver 09/02/2016  . Pressure injury of skin 08/24/2016  . ARF (acute renal failure) ([place]) 06/26/2016  . Tobacco use 06/26/2016  . Decompensated liver disease ([place]) 06/26/2016  . Acute kidney injury ([place])   . PAT (paroxysmal atrial tachycardia) ([place]) 05/12/2016  . Ascites 05/10/2016  . Decompensated hepatic cirrhosis ([place]) 05/10/2016  . Esophageal varices ([place]) 01/23/2014  . Cirrhosis of liver with ascites ([place]) 11/16/2013  . Chest pain at rest 09/23/2011  . Heart palpitations 12/03/2010  . Atrial flutter ([place])   . CHD (congenital heart disease)   . Edema   . Hyperlipidemia   . Poorly controlled type 2 diabetes mellitus with peripheral neuropathy [facility])     Past Surgical History:  Procedure Laterality Date  . ABDOMINAL HYSTERECTOMY    . ASD versus VSD repair  1979   Operative notes not available.  . ATRIAL ABLATION SURGERY  10 /2011  . [place]   x2  . CHOLECYSTECTOMY    . ESOPHAGOGASTRODUODENOSCOPY (EGD) WITH PROPOFOL N/A 11/16/2013   Procedure: ESOPHAGOGASTRODUODENOSCOPY (EGD) WITH PROPOFOL;  Surgeon: Lear Ng, MD;  Location: [facility];  Service: Endoscopy;  Laterality: N/A;  . ESOPHAGOGASTRODUODENOSCOPY (EGD) WITH PROPOFOL N/A 01/23/2014   Procedure: ESOPHAGOGASTRODUODENOSCOPY (EGD) WITH PROPOFOL;  Surgeon: Lear Ng, MD;  Location: [facility];  Service: Endoscopy;  Laterality: N/A;  . GASTRIC VARICES BANDING N/A 11/16/2013   Procedure: GASTRIC VARICES BANDING;  Surgeon: Lear Ng, MD;  Location: [facility];   Service: Endoscopy;  Laterality: N/A;  . GASTRIC VARICES BANDING N/A 01/23/2014   Procedure: GASTRIC VARICES BANDING;  Surgeon: Lear Ng, MD;  Location: [facility];  Service: Endoscopy;  Laterality: N/A;  . HERNIA REPAIR    . IR GENERIC HISTORICAL  05/11/2016   IR PARACENTESIS 05/11/2016 Ascencion Dike, PA-C [facility]-INTERV RAD  . IR PARACENTESIS  07/08/2016  . IR PARACENTESIS  08/09/2016  . IR PARACENTESIS  08/24/2016  . IR PARACENTESIS  09/03/2016  . IR PARACENTESIS  09/16/2016  . IR PARACENTESIS  09/23/2016  . IR PARACENTESIS  10/07/2016  . IR PARACENTESIS  10/14/2016  . IR PARACENTESIS  10/27/2016  . IR PARACENTESIS  11/09/2016  . IR PARACENTESIS  11/30/2016  . IR PARACENTESIS  12/06/2016  . IR PARACENTESIS  12/13/2016  . IR PARACENTESIS  12/23/2016  . IR PARACENTESIS  12/30/2016  . IR PARACENTESIS  01/12/2017  . IR PARACENTESIS  02/01/2017  . IR PERC TUN PERIT CATH WO PORT S&I Dartha Lodge  02/18/2017  . OTHER SURGICAL HISTORY     partial hysterectomy  . PARACENTESIS  09/03/2016    OB History    No data available       Home Medications    Prior to Admission medications   Medication Sig Start Date End Date Taking? Authorizing Provider  acetaminophen (TYLENOL) 325 MG tablet Take 325 mg by mouth every 6 (six) hours as needed for moderate pain.   Yes [provider]  amiodarone (PACERONE) 200 MG tablet Take 100 mg by mouth daily.   Yes [provider]  bisacodyl (DULCOLAX) 10 MG suppository Place 10 mg rectally daily as needed for moderate constipation.   Yes [provider]  furosemide (LASIX) 80 MG tablet Take 1 tablet (80 mg total) by mouth 2 (two) times daily. Patient taking differently: Take 80 mg by mouth daily.  09/05/16  Yes Velvet Bathe, MD  hydroxypropyl methylcellulose / hypromellose (ISOPTO TEARS / GONIOVISC) 2.5 % ophthalmic solution Place 1 drop into both eyes 3 (three) times daily as needed for dry eyes.   Yes [provider]  hydrOXYzine  (ATARAX/VISTARIL) 25 MG tablet Take 25 mg by mouth every 6 (six) hours as needed for itching.   Yes [provider]  LORazepam (ATIVAN) 0.5 MG tablet Take 1 tablet (0.5 mg total) by mouth every 6 (six) hours as needed for anxiety. 01/21/17  Yes Charlynne Cousins, MD  magnesium hydroxide (MILK OF MAGNESIA) 400 MG/5ML suspension Take 30 mLs by mouth daily as needed for mild constipation.   Yes [provider]  Morphine Sulfate (MORPHINE CONCENTRATE) 10 mg / 0.5 ml concentrated solution Take 0.5 mLs (10 mg total) by mouth every 2 (two) hours as needed for severe pain or shortness of breath. 01/21/17  Yes Charlynne Cousins, MD  OXYGEN Inhale 4 L into the lungs continuous.   Yes [provider]  prochlorperazine (  COMPAZINE) 10 MG tablet Take 10 mg by mouth every 6 (six) hours as needed for nausea or vomiting.   Yes [provider]  ranitidine (ZANTAC) 150 MG tablet Take 150 mg by mouth daily. 02/16/17  Yes [provider]  senna (SENOKOT) 8.6 MG tablet Take 1 tablet by mouth daily.   Yes [provider]  spironolactone (ALDACTONE) 100 MG tablet Take 50 mg by mouth daily.   Yes [provider]  glimepiride (AMARYL) 2 MG tablet Take 2 mg by mouth 2 (two) times daily before a meal.    [provider]  Homeopathic Products (EQL EAR DROPS) SOLN Place 5 drops into the right ear daily.    [provider]  HUMULIN N 100 UNIT/ML injection Inject 15-25 Units into the skin See admin instructions. 25 units in the morning before breakfast and 15 units at bedtime 12/15/16   [provider]  ondansetron (ZOFRAN-ODT) 4 MG disintegrating tablet Take 4 mg by mouth every 8 (eight) hours as needed for nausea/vomiting. DISSOLVE IN THE MOUTH 12/07/16   [provider]    Family History Family History  Problem Relation Age of Onset  . Hypertension Mother   . Kidney disease Sister   . Depression Paternal Grandfather   .  Diabetes Brother     Social History Social History   Tobacco Use  . Smoking status: Former Smoker    Packs/day: 0.50    Years: 36.00    Pack years: 18.00    Types: Cigarettes    Last attempt to quit: 02/16/2016    Years since quitting: 1.1  . Smokeless tobacco: Never Used  . Tobacco comment: was smoking 4 cigs/day prior to admission.  Substance Use Topics  . Alcohol use: No  . Drug use: No     Allergies   Monosodium glutamate; Red dye; Erythromycin; Penicillins; Statins; Sulfa antibiotics; Glipizide; Iohexol; and Tape   Review of Systems Review of Systems  Unable to perform ROS: Dementia     Physical Exam Updated Vital Signs BP (!) 92/47   Pulse 82   Temp 98.2 F (36.8 C) (Oral)   Resp 18   SpO2 98%   Physical Exam  Physical Exam Vitals:   04/05/17 0400 04/05/17 0600  BP: 97/76 (!) 92/47  Pulse: 91 82  Resp:    Temp:    SpO2: 98% 98%   Constitutional: Patient is in no acute distress Head: Normocephalic and atraumatic.  Eyes: Extraocular motion intact, no scleral icterus Neck: Supple without meningismus, mass, or overt JVD; arrived with C collar Respiratory: Effort normal and breath sounds normal. No respiratory distress. CV: Heart regular rate and rhythm, no obvious murmurs.  Pulses +2 and symmetric Abdomen: Soft, non-tender, non-distended MSK: Extremities are atraumatic without deformity, ROM intact Skin: Warm, dry, intact Neuro: CN II-XII intact; UE/LE fAROM MS 5/5  Psychiatric: Mood and affect are normal.  ED Treatments / Results  Labs (all labs ordered are listed, but only abnormal results are displayed) Labs Reviewed  CBC WITH DIFFERENTIAL/PLATELET - Abnormal; Notable for the following components:      Result Value   WBC 14.7 (*)    Neutro Abs 13.0 (*)    Lymphs Abs 0.4 (*)    Monocytes Absolute 1.3 (*)    All other components within normal limits  COMPREHENSIVE METABOLIC PANEL - Abnormal; Notable for the following components:   Sodium  130 (*)    Potassium 6.9 (*)    Chloride 100 (*)  CO2 14 (*)    Glucose, Bld 292 (*)    BUN 110 (*)    Creatinine, Ser 4.13 (*)    Total Protein 6.4 (*)    Albumin 2.5 (*)    GFR calc non Af Amer 10 (*)    GFR calc Af Amer 12 (*)    Anion gap 16 (*)    All other components within normal limits  AMMONIA - Abnormal; Notable for the following components:   Ammonia 115 (*)    All other components within normal limits  URINALYSIS, ROUTINE W REFLEX MICROSCOPIC    EKG  EKG Interpretation  Date/Time:  Monday April 04 2017 19:42:09 EST Ventricular Rate:  101 PR Interval:  226 QRS Duration: 178 QT Interval:  414 QTC Calculation: 536 R Axis:   112 Text Interpretation:  Sinus tachycardia with 1st degree A-V block Right bundle branch block Septal infarct , age undetermined T wave abnormality, consider inferior ischemia Abnormal ECG Confirmed by Sherwood Gambler [phone]) on 04/05/2017 8:58:37 PM       Radiology Dg Chest 2 View  Result Date: 03/25/2017 CLINICAL DATA:  Unwitnessed fall. EXAM: CHEST  2 VIEW COMPARISON:  Radiograph 02/07/2017 FINDINGS: Post median sternotomy. Low lung volumes. Bibasilar atelectasis or scarring. No pneumothorax. No definite pleural effusion. Peritoneal catheter in the right upper quadrant. No acute osseous abnormalities are seen. IMPRESSION: Low lung volumes with bibasilar atelectasis or scarring. No evident acute traumatic injury. Electronically Signed   By: Jeb Levering M.D.   On: 04/09/2017 22:12   Dg Pelvis 1-2 Views  Result Date: 04/11/2017 CLINICAL DATA:  Unwitnessed fall today. EXAM: PELVIS - 1-2 VIEW COMPARISON:  Abdominal CT reformats 12/04/2016 FINDINGS: The cortical margins of the bony pelvis are intact. No fracture. Pubic symphysis and sacroiliac joints are congruent. Both femoral heads are well-seated in the respective acetabula. Chronic subcutaneous calcification adjacent to the right greater trochanter. IMPRESSION: No pelvic fracture.  Electronically Signed   By: Jeb Levering M.D.   On: 04/03/2017 22:13   Ct Head Wo Contrast  Result Date: 04/11/2017 CLINICAL DATA:  Unwitnessed fall. Pain. Altered level of consciousness. EXAM: CT HEAD WITHOUT CONTRAST CT CERVICAL SPINE WITHOUT CONTRAST TECHNIQUE: Multidetector CT imaging of the head and cervical spine was performed following the standard protocol without intravenous contrast. Multiplanar CT image reconstructions of the cervical spine were also generated. COMPARISON:  02/07/2017 FINDINGS: CT HEAD FINDINGS Brain: Chronic small vessel ischemic disease of periventricular white matter. No acute intracranial hemorrhage, midline shift or edema. No large vascular territory infarct. No intra-axial mass nor extra-axial fluid collections. No hydrocephalus. Midline fourth ventricle and basal cisterns. Cerebellum is unremarkable. Vascular: No hyperdense vessels or unexpected calcifications. Skull: Normal Sinuses/Orbits: No significant mucosal thickening or air-fluid levels. Clear mastoids. Intact orbits and globes. No retrobulbar hemorrhage or hematoma. Other: None CT CERVICAL SPINE FINDINGS Alignment: Maintained cervical lordosis. Intact craniocervical relationship. Intact atlantodental interval. Skull base and vertebrae: No skull fracture. Soft tissues and spinal canal: No prevertebral fluid or swelling. No visible canal hematoma. Disc levels: No focal disc herniation, significant central or neural foraminal encroachment. No jumped or perched facets. Upper chest: No pneumothorax or pulmonary consolidation. Other: None IMPRESSION: 1. No acute intracranial abnormality. Minimal chronic small vessel ischemic disease. 2. No acute cervical spine fracture or posttraumatic listhesis. Electronically Signed   By: Ashley Royalty M.D.   On: 04/07/2017 20:43   Ct Cervical Spine Wo Contrast  Result Date: 04/11/2017 CLINICAL DATA:  Unwitnessed fall. Pain. Altered level of consciousness.  EXAM: CT HEAD WITHOUT  CONTRAST CT CERVICAL SPINE WITHOUT CONTRAST TECHNIQUE: Multidetector CT imaging of the head and cervical spine was performed following the standard protocol without intravenous contrast. Multiplanar CT image reconstructions of the cervical spine were also generated. COMPARISON:  02/07/2017 FINDINGS: CT HEAD FINDINGS Brain: Chronic small vessel ischemic disease of periventricular white matter. No acute intracranial hemorrhage, midline shift or edema. No large vascular territory infarct. No intra-axial mass nor extra-axial fluid collections. No hydrocephalus. Midline fourth ventricle and basal cisterns. Cerebellum is unremarkable. Vascular: No hyperdense vessels or unexpected calcifications. Skull: Normal Sinuses/Orbits: No significant mucosal thickening or air-fluid levels. Clear mastoids. Intact orbits and globes. No retrobulbar hemorrhage or hematoma. Other: None CT CERVICAL SPINE FINDINGS Alignment: Maintained cervical lordosis. Intact craniocervical relationship. Intact atlantodental interval. Skull base and vertebrae: No skull fracture. Soft tissues and spinal canal: No prevertebral fluid or swelling. No visible canal hematoma. Disc levels: No focal disc herniation, significant central or neural foraminal encroachment. No jumped or perched facets. Upper chest: No pneumothorax or pulmonary consolidation. Other: None IMPRESSION: 1. No acute intracranial abnormality. Minimal chronic small vessel ischemic disease. 2. No acute cervical spine fracture or posttraumatic listhesis. Electronically Signed   By: Ashley Royalty M.D.   On: 04/03/2017 20:43    Procedures Procedures (including critical care time)  Medications Ordered in ED Medications  morphine CONCENTRATE 10 MG/0.5ML oral solution 5 mg (not administered)    Or  morphine CONCENTRATE 10 MG/0.5ML oral solution 5 mg (not administered)  morphine 4 MG/ML injection 1 mg (not administered)  LORazepam (ATIVAN) tablet 1 mg (not administered)    Or  LORazepam  (ATIVAN) 2 MG/ML concentrated solution 1 mg (not administered)    Or  LORazepam (ATIVAN) injection 1 mg (not administered)  ondansetron (ZOFRAN-ODT) disintegrating tablet 4 mg (not administered)    Or  ondansetron (ZOFRAN) injection 4 mg (not administered)  sodium chloride flush (NS) 0.9 % injection 3 mL (3 mLs Intravenous Given 04/01/2017 2326)  sodium chloride flush (NS) 0.9 % injection 3 mL (not administered)  0.9 %  sodium chloride infusion (not administered)     Initial Impression / Assessment and Plan / ED Course  I have reviewed the triage vital signs and the nursing notes.  Pertinent labs & imaging results that were available during my care of the patient were reviewed by me and considered in my medical decision making (see chart for details).    72 year old female in hospice/DNR secondary to idiopathic cirrhosis/chronic ascites, diabetes, congenital heart disease, heart failure, proximal small A. fib presents to the emergency department [facility] after an unwitnessed fall.  Facility was concerned about hip pain.  Patient's baseline cognitive/functional status.  EMS reports no appreciated deformity/change in functional status.  Family members at bedside patient reports decline mental status x 7d.  Physical exams unremarkable for acute injury.  Review of CT head/C-spine no acute findings.  Review chest x-ray no evidence of acute infectious etiology.  Review of pelvis no findings for fracture/dislocation.  Labs with mild leukocytosis of 14,000, stable H&H, urine profile no infectious etiology, patient with hyperkalemia at 6 along with AK I.  Ammonia elevated in the 100s with history/clinical presentation consistent with hepatic encephalopathy.  Normal saline bolus along with calcium gluconate given in the emergency department noted changes on EKG slight widening of the QRS.  Discussion with family members at bedside although patient is DNR/hospice plan for admission for  comfort care.    Final Clinical Impressions(s) / ED Diagnoses  Final diagnoses:  AKI (acute kidney injury) ([place])  Hyperkalemia  Hepatic encephalopathy [facility])    ED Discharge Orders    None       Willette Alma, DO 04/05/17 [place], MD 04-09-17 0100

## 2017-04-04 NOTE — ED Triage Notes (Signed)
[facility]- pt coming from [place] after an unwitnessed fall. Facility reported pt had hip pain but pt was moving with EMS. Pt confused and at baseline. DNR with patient. C-collar in place. 120/79, HR 90, RR20

## 2017-04-04 NOTE — Progress Notes (Signed)
This encounter was created in error - please disregard.

## 2017-04-04 NOTE — H&P (Signed)
TRH H&P   Patient Demographics:    Natalie Lane, is a 72 y.o. female  MRN: [id number]   DOB - 1945/07/22  Admit Date - 04/11/2017  Outpatient Primary MD for the patient is Carol Ada, MD  Referring MD/NP/PA:  Sherwood Gambler  Outpatient Specialists:   Patient coming from:  SNF  Chief Complaint  Patient presents with  . Fall      HPI:    Natalie Lane  is a 72 y.o. female, w Pafib, CHF (diastolic), Dm2, Gerd, Cirrhosis, gastric varices, was at [facility], apparently had fall at [facility] and sent to ER for evaluation and then was found to have ARF and severe hyperkalemia.     Per family they wish her to be CMO,  Her son does not want her to be treated for her ARF, or hyperkalemia.   In ED,  CT brain IMPRESSION: 1. No acute intracranial abnormality. Minimal chronic small vessel ischemic disease. 2. No acute cervical spine fracture or posttraumatic listhesis.  CXR  IMPRESSION: Low lung volumes with bibasilar atelectasis or scarring. No evident acute traumatic injury.  Pelvic Xray IMPRESSION: No pelvic fracture.  urinalysis negative  Ammonia 115 (prev 40)  Na 130, K 6.9, Bun 110, Creatinine 4.13  Ast 35, Alt 19  Glucose 292  Hco3=14, AG 16  Pt will be admitted for ARF, Hyperkalemia.  Family desires CMO    Review of systems:    In addition to the HPI above, pt appears confused, unable to answer appropriately.   No Fever-chills, No Headache, No changes with Vision or hearing, No problems swallowing food or Liquids, No Chest pain, Cough or Shortness of Breath, No Abdominal pain, No Nausea or Vommitting, Bowel movements are regular, No Blood in stool or Urine, No dysuria, No new skin rashes or bruises, No new joints pains-aches,  No new weakness, tingling, numbness in any extremity, No recent weight gain or loss, No polyuria, polydypsia or  polyphagia, No significant Mental Stressors.  A full 10 point Review of Systems was done, except as stated above, all other Review of Systems were negative.   With Past History of the following :    Past Medical History:  Diagnosis Date  . Anemia   . Ascites   . CHD (congenital heart disease)    a. 1979 s/p septal defect repair (? VSD vs ASD), old op notes not available;  b. 04/2016 Echo: EF 60-65%, no rwma, Gr2 DD, mild AS, mild TR, no PFO (prev repaired).  . Chronic diastolic CHF (congestive heart failure) ([place])    04/2016 Echo: EF 60-65%, no rwma, Gr2 DD.  Marland Kitchen Complication of anesthesia   . 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 04/2016 s/p paracentesis.  . Diabetes mellitus, type 2 ([place])   . Dyspnea   . Edema   . Gastric varices    a. s/p banding.  Marland Kitchen GERD (gastroesophageal  reflux disease)   . History of blood transfusion   . Hyperlipidemia   . Normal stress echocardiogram 2008  . Pancreatic adenoma   . Paroxysmal atrial flutter ([place])    a. 11/2009 s/p RFCA.  Marland Kitchen PONV (postoperative nausea and vomiting)    with Cholecystectomy  . Tobacco abuse    a. smokes 4 cigarettes/day.      Past Surgical History:  Procedure Laterality Date  . ABDOMINAL HYSTERECTOMY    . ASD versus VSD repair  1979   Operative notes not available.  . ATRIAL ABLATION SURGERY  10 /2011  . [place]   x2  . CHOLECYSTECTOMY    . ESOPHAGOGASTRODUODENOSCOPY (EGD) WITH PROPOFOL N/A 11/16/2013   Procedure: ESOPHAGOGASTRODUODENOSCOPY (EGD) WITH PROPOFOL;  Surgeon: Lear Ng, MD;  Location: [place];  Service: Endoscopy;  Laterality: N/A;  . ESOPHAGOGASTRODUODENOSCOPY (EGD) WITH PROPOFOL N/A 01/23/2014   Procedure: ESOPHAGOGASTRODUODENOSCOPY (EGD) WITH PROPOFOL;  Surgeon: Lear Ng, MD;  Location: [place];  Service: Endoscopy;  Laterality: N/A;  . GASTRIC VARICES BANDING N/A 11/16/2013   Procedure: GASTRIC VARICES BANDING;  Surgeon: Lear Ng, MD;  Location: [place];  Service: Endoscopy;  Laterality: N/A;  . GASTRIC VARICES BANDING N/A 01/23/2014   Procedure: GASTRIC VARICES BANDING;  Surgeon: Lear Ng, MD;  Location: [place];  Service: Endoscopy;  Laterality: N/A;  . HERNIA REPAIR    . IR GENERIC HISTORICAL  05/11/2016   IR PARACENTESIS 05/11/2016 Ascencion Dike, PA-C [facility]-INTERV RAD  . IR PARACENTESIS  07/08/2016  . IR PARACENTESIS  08/09/2016  . IR PARACENTESIS  08/24/2016  . IR PARACENTESIS  09/03/2016  . IR PARACENTESIS  09/16/2016  . IR PARACENTESIS  09/23/2016  . IR PARACENTESIS  10/07/2016  . IR PARACENTESIS  10/14/2016  . IR PARACENTESIS  10/27/2016  . IR PARACENTESIS  11/09/2016  . IR PARACENTESIS  11/30/2016  . IR PARACENTESIS  12/06/2016  . IR PARACENTESIS  12/13/2016  . IR PARACENTESIS  12/23/2016  . IR PARACENTESIS  12/30/2016  . IR PARACENTESIS  01/12/2017  . IR PARACENTESIS  02/01/2017  . IR PERC TUN PERIT CATH WO PORT S&I Dartha Lodge  02/18/2017  . OTHER SURGICAL HISTORY     partial hysterectomy  . PARACENTESIS  09/03/2016      Social History:     Social History   Tobacco Use  . Smoking status: Former Smoker    Packs/day: 0.50    Years: 36.00    Pack years: 18.00    Types: Cigarettes    Last attempt to quit: 02/16/2016    Years since quitting: 1.1  . Smokeless tobacco: Never Used  . Tobacco comment: was smoking 4 cigs/day prior to admission.  Substance Use Topics  . Alcohol use: No     Lives - at SNF  Mobility -  unclear   Family History :     Family History  Problem Relation Age of Onset  . Hypertension Mother   . Kidney disease Sister   . Depression Paternal Grandfather   . Diabetes Brother       Home Medications:   Prior to Admission medications   Medication Sig Start Date End Date Taking? Authorizing Provider  acetaminophen (TYLENOL) 325 MG tablet Take 325 mg by mouth every 6 (six) hours as needed for moderate pain.   Yes [provider]  amiodarone (PACERONE) 200 MG tablet Take  100 mg by mouth daily.   Yes [provider]  bisacodyl (DULCOLAX) 10  MG suppository Place 10 mg rectally daily as needed for moderate constipation.   Yes [provider]  furosemide (LASIX) 80 MG tablet Take 1 tablet (80 mg total) by mouth 2 (two) times daily. Patient taking differently: Take 80 mg by mouth daily.  09/05/16  Yes Velvet Bathe, MD  hydroxypropyl methylcellulose / hypromellose (ISOPTO TEARS / GONIOVISC) 2.5 % ophthalmic solution Place 1 drop into both eyes 3 (three) times daily as needed for dry eyes.   Yes [provider]  hydrOXYzine (ATARAX/VISTARIL) 25 MG tablet Take 25 mg by mouth every 6 (six) hours as needed for itching.   Yes [provider]  LORazepam (ATIVAN) 0.5 MG tablet Take 1 tablet (0.5 mg total) by mouth every 6 (six) hours as needed for anxiety. 01/21/17  Yes Charlynne Cousins, MD  magnesium hydroxide (MILK OF MAGNESIA) 400 MG/5ML suspension Take 30 mLs by mouth daily as needed for mild constipation.   Yes [provider]  Morphine Sulfate (MORPHINE CONCENTRATE) 10 mg / 0.5 ml concentrated solution Take 0.5 mLs (10 mg total) by mouth every 2 (two) hours as needed for severe pain or shortness of breath. 01/21/17  Yes Charlynne Cousins, MD  OXYGEN Inhale 4 L into the lungs continuous.   Yes [provider]  prochlorperazine (COMPAZINE) 10 MG tablet Take 10 mg by mouth every 6 (six) hours as needed for nausea or vomiting.   Yes [provider]  ranitidine (ZANTAC) 150 MG tablet Take 150 mg by mouth daily. 02/16/17  Yes [provider]  senna (SENOKOT) 8.6 MG tablet Take 1 tablet by mouth daily.   Yes [provider]  spironolactone (ALDACTONE) 100 MG tablet Take 50 mg by mouth daily.   Yes [provider]  glimepiride (AMARYL) 2 MG tablet Take 2 mg by mouth 2 (two) times daily before a meal.    [provider]  Homeopathic Products (EQL EAR DROPS) SOLN Place 5 drops into  the right ear daily.    [provider]  HUMULIN N 100 UNIT/ML injection Inject 15-25 Units into the skin See admin instructions. 25 units in the morning before breakfast and 15 units at bedtime 12/15/16   [provider]  ondansetron (ZOFRAN-ODT) 4 MG disintegrating tablet Take 4 mg by mouth every 8 (eight) hours as needed for nausea/vomiting. DISSOLVE IN THE MOUTH 12/07/16   [provider]     Allergies:     Allergies  Allergen Reactions  . Monosodium Glutamate Anaphylaxis    MSG  . Red Dye Anaphylaxis  . Erythromycin Other (See Comments)    Reaction:  Migraine   . Penicillins Other (See Comments)    Reaction:  Migraine  Has patient had a PCN reaction causing immediate rash, facial/tongue/throat swelling, SOB or lightheadedness with hypotension: No Has patient had a PCN reaction causing severe rash involving mucus membranes or skin necrosis: No Has patient had a PCN reaction that required hospitalization No Has patient had a PCN reaction occurring within the last 10 years: No If all of the above answers are "NO", then may proceed with Cephalosporin use.  . Statins Other (See Comments)    Reaction:  Severe leg cramps   . Sulfa Antibiotics Hives  . Glipizide Rash  . Iohexol Rash  . Tape Rash    MEDICAL "PLASTIC" TAPE CAUSES BLISTERS!!! PLEASE USE AN ALTERNATIVE     Physical Exam:   Vitals  Blood pressure 94/65, pulse 97, temperature 98.2 F (36.8 C), temperature source  Oral, resp. rate 18, SpO2 98 %.   1. General  lying in bed in NAD,    2. Normal affect and insight, Not Suicidal or Homicidal, Awake Alert, Oriented X 1 (person. Responds to name).  3. No F.N deficits, ALL C.Nerves Intact, Strength 5/5 all 4 extremities, Sensation intact all 4 extremities, Plantars down going.  4. Ears and Eyes appear Normal, Conjunctivae clear, PERRLA. Moist Oral Mucosa.  5. Supple Neck, No JVD, No cervical lymphadenopathy appriciated, No Carotid Bruits.  6.  Symmetrical Chest wall movement, Good air movement bilaterally, CTAB.  7. RRR, No Gallops, Rubs or Murmurs, No Parasternal Heave.  8. Abdominal distension, Positive Bowel Sounds, Abdomen Soft, No tenderness, No organomegaly appriciated,No rebound -guarding or rigidity.  9.  No Cyanosis, Normal Skin Turgor, No Skin Rash or Bruise.  10. Good muscle tone,  joints appear normal , no effusions, Normal ROM.  11. No Palpable Lymph Nodes in Neck or Axillae    Data Review:    CBC Recent Labs  Lab 03/30/2017 2058  WBC 14.7*  HGB 13.6  HCT 41.0  PLT 295  MCV 87.2  MCH 28.9  MCHC 33.2  RDW 15.0  LYMPHSABS 0.4*  MONOABS 1.3*  EOSABS 0.0  BASOSABS 0.0   ------------------------------------------------------------------------------------------------------------------  Chemistries  Recent Labs  Lab 03/24/2017 2058  NA 130*  K 6.9*  CL 100*  CO2 14*  GLUCOSE 292*  BUN 110*  CREATININE 4.13*  CALCIUM 9.1  AST 35  ALT 19  ALKPHOS 93  BILITOT 1.2   ------------------------------------------------------------------------------------------------------------------ CrCl cannot be calculated (Unknown ideal weight.). ------------------------------------------------------------------------------------------------------------------ No results for input(s): TSH, T4TOTAL, T3FREE, THYROIDAB in the last 72 hours.  Invalid input(s): FREET3  Coagulation profile No results for input(s): INR, PROTIME in the last 168 hours. ------------------------------------------------------------------------------------------------------------------- No results for input(s): DDIMER in the last 72 hours. -------------------------------------------------------------------------------------------------------------------  Cardiac Enzymes No results for input(s): CKMB, TROPONINI, MYOGLOBIN in the last 168 hours.  Invalid input(s):  CK ------------------------------------------------------------------------------------------------------------------    Component Value Date/Time   BNP 97.1 02/07/2017 1400     ---------------------------------------------------------------------------------------------------------------  Urinalysis    Component Value Date/Time   COLORURINE YELLOW 04/01/2017 2115   APPEARANCEUR CLEAR 04/10/2017 2115   LABSPEC 1.013 03/25/2017 2115   PHURINE 5.0 04/09/2017 2115   GLUCOSEU NEGATIVE 03/21/2017 2115   HGBUR NEGATIVE 04/11/2017 2115   BILIRUBINUR NEGATIVE 04/02/2017 2115   [place] NEGATIVE 03/20/2017 2115   PROTEINUR NEGATIVE 04/14/2017 2115   NITRITE NEGATIVE 03/18/2017 2115   LEUKOCYTESUR NEGATIVE 04/13/2017 2115    ----------------------------------------------------------------------------------------------------------------   Imaging Results:    Dg Chest 2 View  Result Date: 03/31/2017 CLINICAL DATA:  Unwitnessed fall. EXAM: CHEST  2 VIEW COMPARISON:  Radiograph 02/07/2017 FINDINGS: Post median sternotomy. Low lung volumes. Bibasilar atelectasis or scarring. No pneumothorax. No definite pleural effusion. Peritoneal catheter in the right upper quadrant. No acute osseous abnormalities are seen. IMPRESSION: Low lung volumes with bibasilar atelectasis or scarring. No evident acute traumatic injury. Electronically Signed   By: Jeb Levering M.D.   On: 04/08/2017 22:12   Dg Pelvis 1-2 Views  Result Date: 03/23/2017 CLINICAL DATA:  Unwitnessed fall today. EXAM: PELVIS - 1-2 VIEW COMPARISON:  Abdominal CT reformats 12/04/2016 FINDINGS: The cortical margins of the bony pelvis are intact. No fracture. Pubic symphysis and sacroiliac joints are congruent. Both femoral heads are well-seated in the respective acetabula. Chronic subcutaneous calcification adjacent to the right greater trochanter. IMPRESSION: No pelvic fracture. Electronically Signed   By: Jeb Levering M.D.   On:  04/14/2017 22:13   Ct Head Wo Contrast  Result Date: 04/12/2017 CLINICAL DATA:  Unwitnessed fall. Pain. Altered level of consciousness. EXAM: CT HEAD WITHOUT CONTRAST CT CERVICAL SPINE WITHOUT CONTRAST TECHNIQUE: Multidetector CT imaging of the head and cervical spine was performed following the standard protocol without intravenous contrast. Multiplanar CT image reconstructions of the cervical spine were also generated. COMPARISON:  02/07/2017 FINDINGS: CT HEAD FINDINGS Brain: Chronic small vessel ischemic disease of periventricular white matter. No acute intracranial hemorrhage, midline shift or edema. No large vascular territory infarct. No intra-axial mass nor extra-axial fluid collections. No hydrocephalus. Midline fourth ventricle and basal cisterns. Cerebellum is unremarkable. Vascular: No hyperdense vessels or unexpected calcifications. Skull: Normal Sinuses/Orbits: No significant mucosal thickening or air-fluid levels. Clear mastoids. Intact orbits and globes. No retrobulbar hemorrhage or hematoma. Other: None CT CERVICAL SPINE FINDINGS Alignment: Maintained cervical lordosis. Intact craniocervical relationship. Intact atlantodental interval. Skull base and vertebrae: No skull fracture. Soft tissues and spinal canal: No prevertebral fluid or swelling. No visible canal hematoma. Disc levels: No focal disc herniation, significant central or neural foraminal encroachment. No jumped or perched facets. Upper chest: No pneumothorax or pulmonary consolidation. Other: None IMPRESSION: 1. No acute intracranial abnormality. Minimal chronic small vessel ischemic disease. 2. No acute cervical spine fracture or posttraumatic listhesis. Electronically Signed   By: Ashley Royalty M.D.   On: 04/13/2017 20:43   Ct Cervical Spine Wo Contrast  Result Date: 04/09/2017 CLINICAL DATA:  Unwitnessed fall. Pain. Altered level of consciousness. EXAM: CT HEAD WITHOUT CONTRAST CT CERVICAL SPINE WITHOUT CONTRAST TECHNIQUE:  Multidetector CT imaging of the head and cervical spine was performed following the standard protocol without intravenous contrast. Multiplanar CT image reconstructions of the cervical spine were also generated. COMPARISON:  02/07/2017 FINDINGS: CT HEAD FINDINGS Brain: Chronic small vessel ischemic disease of periventricular white matter. No acute intracranial hemorrhage, midline shift or edema. No large vascular territory infarct. No intra-axial mass nor extra-axial fluid collections. No hydrocephalus. Midline fourth ventricle and basal cisterns. Cerebellum is unremarkable. Vascular: No hyperdense vessels or unexpected calcifications. Skull: Normal Sinuses/Orbits: No significant mucosal thickening or air-fluid levels. Clear mastoids. Intact orbits and globes. No retrobulbar hemorrhage or hematoma. Other: None CT CERVICAL SPINE FINDINGS Alignment: Maintained cervical lordosis. Intact craniocervical relationship. Intact atlantodental interval. Skull base and vertebrae: No skull fracture. Soft tissues and spinal canal: No prevertebral fluid or swelling. No visible canal hematoma. Disc levels: No focal disc herniation, significant central or neural foraminal encroachment. No jumped or perched facets. Upper chest: No pneumothorax or pulmonary consolidation. Other: None IMPRESSION: 1. No acute intracranial abnormality. Minimal chronic small vessel ischemic disease. 2. No acute cervical spine fracture or posttraumatic listhesis. Electronically Signed   By: Ashley Royalty M.D.   On: 03/21/2017 20:43       Assessment & Plan:    Active Problems:   ARF (acute renal failure) (HCC)   Hyperkalemia   Hyponatremia    ARF likely secondary to dehydration Hyperkalemia Hyponatremia CIrrhosis (cryptogenic) Hyperammonemia Dm2   Pt will be admitted for CMO as per son's wish.  Morphine 1mg  iv q2h prn pain Ativan 1mg  iv q2h prn pain Please contact palliative care for consult in am  DVT Prophylaxis - SCDs   AM Labs  Ordered, also please review Full Orders  Family Communication: Admission, patients condition and plan of care including tests being ordered have been discussed with the patient and son  who indicate understanding and agree with the plan and Code Status.  Code Status DNR  Likely DC to  SNF or hospice  Condition GUARDED    Consults called: none  Admission status: inpatient   Time spent in minutes : 45   Jani Gravel M.D on 04/12/2017 at 11:43 PM  Between 7am to 7pm - Pager - [phone] . After 7pm go to [email or web address] - password [facility]  [facility] - Office  [phone]

## 2017-04-05 MED ORDER — SODIUM CHLORIDE 0.9 % IV SOLN
2.0000 mg/h | INTRAVENOUS | Status: DC
  Administered 2017-04-05: 1 mg/h via INTRAVENOUS
  Filled 2017-04-05: qty 10

## 2017-04-05 NOTE — Progress Notes (Addendum)
Hospitalist progress note   Natalie Lane  JAS:[id number] DOB: 04-Mar-1945 DOA: 04/08/2017 PCP: Carol Ada, MD   Specialists:   Brief Narrative:  72 year old female on hospice for the past 9 months secondary to idiopathic cirrhosis and chronic ascites with frequent paracentesis diabetes mellitus congenital heart disease heart failure A. fib admitted from [facility] after a fall- Had been in facility for the past 5 daysll had a CT head CT spine and found to have hyperkalemia with AK I she also had ammonia elevated in the 419F of metabolic encephalopathy  Was admitted because of multiple lab abnormalities and ultimately family requested palliative care input  Assessment & Plan:   Assessment:  The primary encounter diagnosis was AKI (acute kidney injury) ([place]). Diagnoses of Hyperkalemia and Hepatic encephalopathy ([place]) were also pertinent to this visit.  I have consulted palliative care on the patient and had a long discussion with the family with regards to goals of care-I do believe that she has less than 48 hours to live and although family is coming back from out of town-she has a boyfriend who was here until [place] do not think that given her current clinical state hypotension hyperammonemia and acute kidney injury we would do anything other than palliative care and because of patient's restlessness I think morphine drip is indicated I will discontinue telemetry and labs as well as all other monitoring and see if we can get her to a MedSurg bed anticipate hospital death   Would discontinue prophylaxis discussed for long time with family at the bedside discontinue telemetry monitoring can go to [place] if possible and ultimately hospital death    Subjective: Oriented and writhing in b disoriented and writhing in bed unable to clearly  Objective: Vitals:   04/05/17 0400 04/05/17 0600 04/05/17 0900 04/05/17 1000  BP: 97/76 (!) 92/47 95/76 (!) 84/58  Pulse: 91 82 88   Resp:       Temp:      TempSrc:      SpO2: 98% 98% 97%    No intake or output data in the 24 hours ending 04/05/17 1149 There were no vitals filed for this visit.    Examination: Eyes open but not coherent, pupils dilated, writhing in bed Chest clear without added sounds S1-S2 no murmur, abdomen slight distention Lower extremities are slightly swollen Cannot asses neuro exam     Data Reviewed: I have personally reviewed following labs and imaging studies  CBC: Recent Labs  Lab 04/14/2017 2058  WBC 14.7*  NEUTROABS 13.0*  HGB 13.6  HCT 41.0  MCV 87.2  PLT 790   Basic Metabolic Panel: Recent Labs  Lab 04/14/2017 2058  NA 130*  K 6.9*  CL 100*  CO2 14*  GLUCOSE 292*  BUN 110*  CREATININE 4.13*  CALCIUM 9.1   GFR: CrCl cannot be calculated (Unknown ideal weight.). Liver Function Tests: Recent Labs  Lab 03/26/2017 2058  AST 35  ALT 19  ALKPHOS 93  BILITOT 1.2  PROT 6.4*  ALBUMIN 2.5*   No results for input(s): LIPASE, AMYLASE in the last 168 hours. Recent Labs  Lab 03/23/2017 2058  AMMONIA 115*   Coagulation Profile: No results for input(s): INR, PROTIME in the last 168 hours. Cardiac Enzymes: No results for input(s): CKTOTAL, CKMB, CKMBINDEX, TROPONINI in the last 168 hours. CBG: No results for input(s): GLUCAP in the last 168 hours. Urine analysis:    Component Value Date/Time   COLORURINE YELLOW 04/10/2017 2115   APPEARANCEUR CLEAR 03/31/2017 2115  LABSPEC 1.013 03/23/2017 2115   PHURINE 5.0 03/21/2017 2115   GLUCOSEU NEGATIVE 04/13/2017 2115   HGBUR NEGATIVE 04/01/2017 2115   BILIRUBINUR NEGATIVE 04/02/2017 2115   KETONESUR NEGATIVE 04/12/2017 2115   PROTEINUR NEGATIVE 03/24/2017 2115   NITRITE NEGATIVE 04/07/2017 2115   LEUKOCYTESUR NEGATIVE 04/12/2017 2115     Radiology Studies: Reviewed images personally in health database    Scheduled Meds: . sodium chloride flush  3 mL Intravenous Q12H   Continuous Infusions: . sodium chloride        LOS: 1 day    Time spent: 45 min    Verneita Griffes, MD [facility] (P) [phone]   If 7PM-7AM, please contact night-coverage [email or web address] Password [facility] 04/05/2017, 11:49 AM

## 2017-04-05 NOTE — Progress Notes (Signed)
Patient was resting comfortably at this time and family requested minimal interaction and touch at this time

## 2017-04-05 NOTE — ED Provider Notes (Signed)
I saw and evaluated the patient, reviewed the resident's note and I agree with the findings and plan.   EKG Interpretation  Date/Time:  Monday April 04 2017 19:42:09 EST Ventricular Rate:  101 PR Interval:  226 QRS Duration: 178 QT Interval:  414 QTC Calculation: 536 R Axis:   112 Text Interpretation:  Sinus tachycardia with 1st degree A-V block Right bundle branch block Septal infarct , age undetermined T wave abnormality, consider inferior ischemia Abnormal ECG Confirmed by Sherwood Gambler [id number]) on 04/08/2017 8:58:37 PM       Patient presents after fall. Worsening AMS in setting of in hospice care for cirrhosis. Found to have hepatic encephalopathy (not had before) and worsening renal function (AKI on chronic kidney disease). K of 6.9. QRS wider than before. Discussed with family for quite some time. At this time they prefer comfort care, as treatment would only delay her ultimate demise. I think this is reasonable. They understand not treating will lead to death, probably by hyperkalemia. Will treat as comfort care and admit for treatment/support.   CRITICAL CARE Performed by: Ephraim Hamburger   Total critical care time: 30 minutes  Critical care time was exclusive of separately billable procedures and treating other patients.  Critical care was necessary to treat or prevent imminent or life-threatening deterioration.  Critical care was time spent personally by me on the following activities: development of treatment plan with patient and/or surrogate as well as nursing, discussions with consultants, evaluation of patient's response to treatment, examination of patient, obtaining history from patient or surrogate, ordering and performing treatments and interventions, ordering and review of laboratory studies, ordering and review of radiographic studies, pulse oximetry and re-evaluation of patient's condition.    Sherwood Gambler, MD 04/05/17 [phone]

## 2017-04-05 NOTE — ED Notes (Signed)
Pts family at bedside with patient. Per family, they believe patient is in pain due to restlessness and inabilty to console. Used modified pain scale for advanced dementia and pain score is currently 9/10. Will medicate patient with prn morphine.

## 2017-04-06 DIAGNOSIS — R188 Other ascites: Secondary | ICD-10-CM

## 2017-04-06 DIAGNOSIS — K7682 Hepatic encephalopathy: Secondary | ICD-10-CM

## 2017-04-06 DIAGNOSIS — F411 Generalized anxiety disorder: Secondary | ICD-10-CM

## 2017-04-06 DIAGNOSIS — K117 Disturbances of salivary secretion: Secondary | ICD-10-CM

## 2017-04-06 DIAGNOSIS — N179 Acute kidney failure, unspecified: Principal | ICD-10-CM

## 2017-04-06 DIAGNOSIS — K7460 Unspecified cirrhosis of liver: Secondary | ICD-10-CM

## 2017-04-06 DIAGNOSIS — Z515 Encounter for palliative care: Secondary | ICD-10-CM

## 2017-04-06 DIAGNOSIS — K7290 Hepatic failure, unspecified without coma: Secondary | ICD-10-CM

## 2017-04-06 LAB — MRSA PCR SCREENING: MRSA by PCR: NEGATIVE

## 2017-04-06 MED ORDER — MORPHINE BOLUS VIA INFUSION
1.0000 mg | INTRAVENOUS | Status: DC | PRN
  Administered 2017-04-06: 1 mg via INTRAVENOUS
  Filled 2017-04-06: qty 1

## 2017-04-06 MED ORDER — GLYCOPYRROLATE 0.2 MG/ML IJ SOLN
0.2000 mg | INTRAMUSCULAR | Status: DC
Start: 2017-04-06 — End: 2017-04-06
  Administered 2017-04-06: 0.2 mg via INTRAVENOUS
  Filled 2017-04-06: qty 1

## 2017-04-06 MED ORDER — GLYCOPYRROLATE 0.2 MG/ML IJ SOLN
0.3000 mg | INTRAMUSCULAR | Status: DC | PRN
  Administered 2017-04-06: 0.3 mg via INTRAVENOUS
  Filled 2017-04-06: qty 2

## 2017-04-06 MED ORDER — LORAZEPAM 2 MG/ML IJ SOLN: 0.5000 mg | INTRAMUSCULAR | Status: DC | PRN

## 2017-04-06 MED ORDER — MORPHINE BOLUS VIA INFUSION
1.0000 mg | INTRAVENOUS | Status: DC | PRN
  Administered 2017-04-06: 2 mg via INTRAVENOUS
  Filled 2017-04-06: qty 2

## 2017-04-06 MED ORDER — ORAL CARE MOUTH RINSE
15.0000 mL | Freq: Two times a day (BID) | OROMUCOSAL | Status: DC
  Administered 2017-04-06: 15 mL via OROMUCOSAL

## 2017-04-06 MED ORDER — SCOPOLAMINE 1 MG/3DAYS TD PT72
1.0000 | MEDICATED_PATCH | TRANSDERMAL | Status: DC
  Administered 2017-04-06: 1.5 mg via TRANSDERMAL
  Filled 2017-04-06: qty 1

## 2017-04-15 NOTE — Progress Notes (Signed)
Wasted 225 ml of morphine with Noralee Stain RN in the sink this afternoon at approximately 2 PM

## 2017-04-15 NOTE — Progress Notes (Signed)
Pt is Body donor:  Conservation officer, historic buildings Program:  Donor # [id number] [phone] [email or web address]

## 2017-04-15 NOTE — Progress Notes (Signed)
Home Care SW made visit to Patient after learning she had been admitted during a respite stay at [facility].  Her sons were all present and talked about how she had fallen at the facility and that was how she came here.  They stated they just wanted her to have a comfortable death and are okay with her remaining at the hospital.  No other concerns noted during visit.  SW will continue to follow.  Cecilio Asper, [facility] [place]

## 2017-04-15 NOTE — Progress Notes (Signed)
Nutrition Brief Note  Chart reviewed. Pt now transitioning to comfort care.  No further nutrition interventions warranted at this time.  Please re-consult as needed.   Koral Thaden A. Advay Volante, RD, LDN, CDE Pager: [phone] After hours Pager: [phone]  

## 2017-04-15 NOTE — Progress Notes (Signed)
[facility] Liaison: RN visit  Patient experience an unwitnessed fall at [facility] and was brought to [facility] for evaluation. Family reported decline in mental status over past 7 days. The decision was made by family and [facility] medical staff to admit for comfort care. This is a related and covered GIP admission of 028/18/2019 with [facility] diagnosis of Cirrhosis of the Liver per Dr. Tomasa Hosteller. Code status: DNR.   Patient is receiving comfort care only and is an anticipated hospital death.   Admission diagnosis: ARF and hyperkalemia.   Day of hospital GIP LOS: 2  Patient is receiving Robinul for secretions,scopolamine patch 1mg /3days 1.5mg , morphine IV continuous 2-5mg /hr,  Ativan 1mg  PRN SL or IV  for anxiety.   Visited with family at bedside. Patient is appears more comfortable, she does have audible gurgling from secretions and RN is giving PRN dose of Robinul. She does not respond to my voice.   Goals of care: comfort care only.  Discharge planning: anticipated hospital death.  Communication to PCG: Spoke with family at bedside and offered emotional support.   Communication to IDG: Notified Dr. Tomasa Hosteller of admission, RN, CSW and Chaplain are aware of admission. Transfer summary and medication list  placed on the shadow chart.   Please call with any Hospice related questions.  Thank you,  Farrel Gordon, RN, [facility] Liaison [phone]  [facility] hospital liaisons are on [place].

## 2017-04-15 NOTE — Progress Notes (Signed)
Chaplain came to patient's room and discovered that the family's Doristine Bosworth was there. Chaplain entered quietly and appropriately yielded to the meaningful ministry being offered by the [place]. Family responded well to the care of the [facility] and needs pertaining to comfort and prayer were met. Doristine Bosworth shared that he had a solid end of life and post death conversation with the family.

## 2017-04-15 NOTE — Discharge Summary (Signed)
Death Summary  Natalie Lane:[id number] DOB: [birth year] DOA: 04-29-2017  PCP: Carol Ada, MD  Admit date: 2017/04/29 Date of Death: 05-01-2017 Time of Death: 12:48  History of present illness:  Per Dr. Maudie Mercury, Natalie Lane  is a 72 y.o. female, w Pafib, CHF (diastolic), Dm2, Gerd, Cirrhosis, gastric varices, was at [facility], apparently had fall at [facility] and sent to ER for evaluation and then was found to have ARF and severe hyperkalemia. Per family they wish her to be CMO,  Her son does not want her to be treated for her ARF, or hyperkalemia.   Final Diagnoses:  Patient was admitted to the hospital with acute renal failure with severe hyperkalemia, she also has a history of end-stage liver disease for which she has been on hospice for the past 9 months, complicated by recurrent ascites with Pleurx catheter placement.  Per family wishes, care has been focused on comfort, palliative care was also consulted and have followed patient while hospitalized.  She passed away on 2017/05/01 at 12:48.  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    Significant Diagnostic Studies: Dg Chest 2 View  Result Date: 29-Apr-2017 CLINICAL DATA:  Unwitnessed fall. EXAM: CHEST  2 VIEW COMPARISON:  Radiograph 02/07/2017 FINDINGS: Post median sternotomy. Low lung volumes. Bibasilar atelectasis or scarring. No pneumothorax. No definite pleural effusion. Peritoneal catheter in the right upper quadrant. No acute osseous abnormalities are seen. IMPRESSION: Low lung volumes with bibasilar atelectasis or scarring. No evident acute traumatic injury. Electronically Signed   By: Jeb Levering M.D.   On: 04/29/17 22:12   Dg Pelvis 1-2 Views  Result Date:  Apr 29, 2017 CLINICAL DATA:  Unwitnessed fall today. EXAM: PELVIS - 1-2 VIEW COMPARISON:  Abdominal CT reformats 12/04/2016 FINDINGS: The cortical margins of the bony pelvis are intact. No fracture. Pubic symphysis and sacroiliac joints are congruent. Both femoral heads are well-seated in the respective acetabula. Chronic subcutaneous calcification adjacent to the right greater trochanter. IMPRESSION: No pelvic fracture. Electronically Signed   By: Jeb Levering M.D.   On: 2017/04/29 22:13   Ct Head Wo Contrast  Result Date: 2017/04/29 CLINICAL DATA:  Unwitnessed fall. Pain. Altered level of consciousness. EXAM: CT HEAD WITHOUT CONTRAST CT CERVICAL SPINE WITHOUT CONTRAST TECHNIQUE: Multidetector CT imaging of the head and cervical spine was performed following the standard protocol without intravenous contrast. Multiplanar CT image reconstructions of the cervical spine were also generated. COMPARISON:  02/07/2017 FINDINGS: CT HEAD FINDINGS Brain: Chronic small vessel ischemic disease of periventricular white matter. No acute intracranial hemorrhage, midline shift or edema. No large vascular territory infarct. No intra-axial mass nor extra-axial fluid collections. No hydrocephalus. Midline fourth ventricle and basal cisterns. Cerebellum is unremarkable. Vascular: No hyperdense vessels or unexpected calcifications. Skull: Normal Sinuses/Orbits: No significant mucosal thickening or air-fluid levels. Clear mastoids. Intact orbits and globes. No retrobulbar hemorrhage or hematoma. Other: None CT CERVICAL SPINE FINDINGS Alignment: Maintained cervical lordosis. Intact craniocervical relationship. Intact atlantodental interval. Skull base and vertebrae: No skull fracture. Soft tissues and spinal canal: No prevertebral fluid or swelling. No visible canal hematoma. Disc levels: No focal disc herniation, significant central or neural foraminal encroachment. No jumped or perched facets. Upper chest: No pneumothorax or  pulmonary consolidation. Other: None IMPRESSION: 1. No acute intracranial abnormality. Minimal chronic small vessel ischemic disease. 2. No  acute cervical spine fracture or posttraumatic listhesis. Electronically Signed   By: Ashley Royalty M.D.   On: 03/29/2017 20:43   Ct Cervical Spine Wo Contrast  Result Date: 04/02/2017 CLINICAL DATA:  Unwitnessed fall. Pain. Altered level of consciousness. EXAM: CT HEAD WITHOUT CONTRAST CT CERVICAL SPINE WITHOUT CONTRAST TECHNIQUE: Multidetector CT imaging of the head and cervical spine was performed following the standard protocol without intravenous contrast. Multiplanar CT image reconstructions of the cervical spine were also generated. COMPARISON:  02/07/2017 FINDINGS: CT HEAD FINDINGS Brain: Chronic small vessel ischemic disease of periventricular white matter. No acute intracranial hemorrhage, midline shift or edema. No large vascular territory infarct. No intra-axial mass nor extra-axial fluid collections. No hydrocephalus. Midline fourth ventricle and basal cisterns. Cerebellum is unremarkable. Vascular: No hyperdense vessels or unexpected calcifications. Skull: Normal Sinuses/Orbits: No significant mucosal thickening or air-fluid levels. Clear mastoids. Intact orbits and globes. No retrobulbar hemorrhage or hematoma. Other: None CT CERVICAL SPINE FINDINGS Alignment: Maintained cervical lordosis. Intact craniocervical relationship. Intact atlantodental interval. Skull base and vertebrae: No skull fracture. Soft tissues and spinal canal: No prevertebral fluid or swelling. No visible canal hematoma. Disc levels: No focal disc herniation, significant central or neural foraminal encroachment. No jumped or perched facets. Upper chest: No pneumothorax or pulmonary consolidation. Other: None IMPRESSION: 1. No acute intracranial abnormality. Minimal chronic small vessel ischemic disease. 2. No acute cervical spine fracture or posttraumatic listhesis. Electronically Signed    By: Ashley Royalty M.D.   On: 04/10/2017 20:43    Microbiology: Recent Results (from the past 240 hour(s))  MRSA PCR Screening     Status: None   Collection Time: 2017-04-17  9:56 AM  Result Value Ref Range Status   MRSA by PCR NEGATIVE NEGATIVE Final    Comment:        The GeneXpert MRSA Assay (FDA approved for NASAL specimens only), is one component of a comprehensive MRSA colonization surveillance program. It is not intended to diagnose MRSA infection nor to guide or monitor treatment for MRSA infections. Performed at [facility], [place] [address] [place], [place] [address]      Labs: Basic Metabolic Panel: Recent Labs  Lab 04/14/2017 2058  NA 130*  K 6.9*  CL 100*  CO2 14*  GLUCOSE 292*  BUN 110*  CREATININE 4.13*  CALCIUM 9.1   Liver Function Tests: Recent Labs  Lab 03/25/2017 2058  AST 35  ALT 19  ALKPHOS 93  BILITOT 1.2  PROT 6.4*  ALBUMIN 2.5*   No results for input(s): LIPASE, AMYLASE in the last 168 hours. Recent Labs  Lab 03/24/2017 2058  AMMONIA 115*   CBC: Recent Labs  Lab 03/27/2017 2058  WBC 14.7*  NEUTROABS 13.0*  HGB 13.6  HCT 41.0  MCV 87.2  PLT 295   Cardiac Enzymes: No results for input(s): CKTOTAL, CKMB, CKMBINDEX, TROPONINI in the last 168 hours. D-Dimer No results for input(s): DDIMER in the last 72 hours. BNP: Invalid input(s): POCBNP CBG: No results for input(s): GLUCAP in the last 168 hours. Anemia work up No results for input(s): VITAMINB12, FOLATE, FERRITIN, TIBC, IRON, RETICCTPCT in the last 72 hours. Urinalysis    Component Value Date/Time   COLORURINE YELLOW 04/09/2017 2115   APPEARANCEUR CLEAR 04/11/2017 2115   LABSPEC 1.013 04/13/2017 2115   PHURINE 5.0 03/25/2017 2115   GLUCOSEU NEGATIVE 04/11/2017 2115   HGBUR NEGATIVE 04/11/2017 2115   BILIRUBINUR NEGATIVE 04/07/2017 2115   [place] NEGATIVE 04/11/2017 2115   PROTEINUR NEGATIVE 03/31/2017 2115  NITRITE NEGATIVE 04/05/2017 2115   LEUKOCYTESUR  NEGATIVE 03/27/2017 2115   Sepsis Labs Invalid input(s): PROCALCITONIN,  WBC,  LACTICIDVEN   SIGNED:  Marzetta Board, MD  [facility] 2017/04/29, 4:41 PM Pager   If 7PM-7AM, please contact night-coverage [email or web address] Password [email or web address]

## 2017-04-15 NOTE — Consult Note (Signed)
Consultation Note Date: 2017-04-28   Patient Name: Natalie Lane  DOB: [birth year]  MRN: [id number]  Age / Sex: 72 y.o., female  PCP: Carol Ada, MD Referring Physician: Caren Griffins, MD  Reason for Consultation: Non pain symptom management and Terminal Care  HPI/Patient Profile: 72 y.o. female  with past medical history of cirrhosis, recurrent ascites, aflutter, HLD, DM, CHF, GERD, anemia, smoker  admitted on 03/26/2017 after fall from SNF. 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 In ED, patient found to have severe hyperkalemia (6.9) and acute renal failure (BUN/Cr 110, 4.13). CT head and pelvis negative for acute findings. Ammonia 115. Family requests comfort measures only. Palliative medicine consultation for EOL care.   Clinical Assessment and Goals of Care: I have reviewed medical records, discussed with care team, and met with son Nicole Kindred) and daughter-in-laws Olivia Mackie and [place]) at bedside to discuss diagnosis, prognosis, GOC, EOL wishes, disposition and options.  Introduced Palliative Medicine as specialized medical care for people living with serious illness. It focuses on providing relief from the symptoms and stress of a serious illness.   Upon arrival to room, patient appears distressed with agitation and audible secretions. Placed orders at bedside including scheduled robinul and scopolamine patch. Initially gave morphine 35m bolus via infusion with minimal relief. Shortly after, I gave morphine 267mbolus via infusion. RN administered robinul 0.52m30mV and placed scopolamine patch. We also repositioned the patient.   We discussed a brief life review of the patient. She has been receiving hospice services secondary to cirrhosis and recurrent ascites. Patient lives with boyfriend, BilRush Landmarkrior to hospitalization, [facility]. TraOlivia Mackiecalls series of events leading up to hospitalization. The family is surprised she lived past christmas due to decline in functional and nutritional status.   They confirmed focus on keeping her comfortable. They have been preparing for her death for many months. TraOlivia Mackieeaks of her wish for body donation to [facility] she can help students learn and advance medicine. Ms. 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 I explained likely hours but not to be surprised if it was a day or two. Reassured her that our focus will be to ensure comfort and dignity at EOL. Educated on EOL expectations and medication regimen to ensure comfort.   Spiritual individual. Family would appreciate a visit for chaplain.  Therapeutic listening as family shares many stories of Ms. WheMee Hivesmotional/spiritual support provided. Questions and concerns addressed.    SUMMARY OF RECOMMENDATIONS    DNR/DNI  Comfort measures only. Discontinued interventions not aimed at comfort.   Symptom management--see below.  Spiritual consult for EOL care. Family requests prayer.  Patient not stable for transfer. Appears to be getting closer to EOL. Anticipate hospital death.  Code Status/Advance Care Planning:  DNR  Symptom Management:   Continue Morphine infusion 52mg73m  RN may bolus via infusion 1-52mg 31mmin59m pain/dyspnea/air hunger  Scopolamine patch  Robinul 0.63m IV q4h  Robinul 0.328mIV q4h prn secretions  Ativan 0.26m326mV q4h prn anxiety  Palliative Prophylaxis:   Aspiration, Delirium Protocol, Frequent Pain Assessment, Oral Care and Turn Reposition  Additional Recommendations (Limitations, Scope, Preferences):  Full Comfort Care  Psycho-social/Spiritual:   Desire for further Chaplaincy support: yes  Additional Recommendations: Caregiving  Support/Resources and Education on Hospice  Prognosis:   Hours -  Days  Discharge Planning: Anticipated Hospital Death      Primary Diagnoses: Present on Admission: . ARF (acute renal failure) (HCCPah[place] Hyperkalemia   I have reviewed the medical record, interviewed the patient and family, and examined the patient. The following aspects are pertinent.  Past Medical History:  Diagnosis Date  . Anemia   . Ascites   . CHD (congenital heart disease)    a. 1979 s/p septal defect repair (? VSD vs ASD), old op notes not available;  b. 04/2016 Echo: EF 60-65%, no rwma, Gr2 DD, mild AS, mild TR, no PFO (prev repaired).  . Chronic diastolic CHF (congestive heart failure) (HCCDiagonal  04/2016 Echo: EF 60-65%, no rwma, Gr2 DD.  . CMarland Kitchenmplication of anesthesia   . Cryptogenic cirrhosis (HCCSaltillo  a. 04/2016 s/p paracentesis.  . Diabetes mellitus, type 2 (HCCEarle . Dyspnea   . Edema   . Gastric varices    a. s/p banding.  . GMarland KitchenRD (gastroesophageal reflux disease)   . History of blood transfusion   . Hyperlipidemia   . Normal stress echocardiogram 2008  . Pancreatic adenoma   . Paroxysmal atrial flutter (HCCBuckshot  a. 11/2009 s/p RFCA.  . PMarland KitchenNV (postoperative nausea and vomiting)    with Cholecystectomy  . Tobacco abuse    a. smokes 4 cigarettes/day.   Social History   Socioeconomic History  . Marital status: Married    Spouse name: None  . Number of children: 3  . Years of education: None  . Highest education level: None  Social Needs  . Financial resource strain: None  . Food insecurity - worry: None  . Food insecurity - inability: None  . Transportation needs - medical: None  . Transportation needs - non-medical: None  Occupational History  . 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 Smoking status: Former Smoker    Packs/day: 0.50    Years: 36.00    Pack years: 18.00    Types: Cigarettes    Last attempt to quit: 02/16/2016    Years since quitting: 1.1  . Smokeless tobacco: Never Used  . Tobacco comment: was smoking  4 cigs/day prior to admission.  Substance and Sexual Activity  . Alcohol use: No  . Drug use: No  . Sexual activity: None  Other Topics Concern  . None  Social History Narrative   [place].  Does not routinely exercise.   Family History  Problem Relation Age of Onset  . Hypertension Mother   . Kidney disease Sister   . Depression Paternal Grandfather   . Diabetes Brother    Scheduled Meds: . glycopyrrolate  0.2 mg Intravenous Q4H  . mouth rinse  15 mL Mouth Rinse BID  . scopolamine  1 patch Transdermal Q72H  . sodium chloride flush  3 mL Intravenous Q12H   Continuous Infusions: . sodium chloride    . morphine 2 mg/hr (04/05/17 1716)   PRN Meds:.sodium chloride, glycopyrrolate, LORazepam, morphine, ondansetron **OR** ondansetron (ZOFRAN)  IV, sodium chloride flush Medications Prior to Admission:  Prior to Admission medications   Medication Sig Start Date End Date Taking? Authorizing Provider  acetaminophen (TYLENOL) 325 MG tablet Take 325 mg by mouth every 6 (six) hours as needed for moderate pain.   Yes [provider]  amiodarone (PACERONE) 200 MG tablet Take 100 mg by mouth daily.   Yes [provider]  bisacodyl (DULCOLAX) 10 MG suppository Place 10 mg rectally daily as needed for moderate constipation.   Yes [provider]  furosemide (LASIX) 80 MG tablet Take 1 tablet (80 mg total) by mouth 2 (two) times daily. Patient taking differently: Take 80 mg by mouth daily.  09/05/16  Yes Velvet Bathe, MD  hydroxypropyl methylcellulose / hypromellose (ISOPTO TEARS / GONIOVISC) 2.5 % ophthalmic solution Place 1 drop into both eyes 3 (three) times daily as needed for dry eyes.   Yes [provider]  hydrOXYzine (ATARAX/VISTARIL) 25 MG tablet Take 25 mg by mouth every 6 (six) hours as needed for itching.   Yes [provider]  LORazepam (ATIVAN) 0.5 MG tablet Take 1 tablet (0.5 mg total) by mouth every 6 (six) hours as  needed for anxiety. 01/21/17  Yes Charlynne Cousins, MD  magnesium hydroxide (MILK OF MAGNESIA) 400 MG/5ML suspension Take 30 mLs by mouth daily as needed for mild constipation.   Yes [provider]  Morphine Sulfate (MORPHINE CONCENTRATE) 10 mg / 0.5 ml concentrated solution Take 0.5 mLs (10 mg total) by mouth every 2 (two) hours as needed for severe pain or shortness of breath. 01/21/17  Yes Charlynne Cousins, MD  OXYGEN Inhale 4 L into the lungs continuous.   Yes [provider]  prochlorperazine (COMPAZINE) 10 MG tablet Take 10 mg by mouth every 6 (six) hours as needed for nausea or vomiting.   Yes [provider]  ranitidine (ZANTAC) 150 MG tablet Take 150 mg by mouth daily. 02/16/17  Yes [provider]  senna (SENOKOT) 8.6 MG tablet Take 1 tablet by mouth daily.   Yes [provider]  spironolactone (ALDACTONE) 100 MG tablet Take 50 mg by mouth daily.   Yes [provider]  glimepiride (AMARYL) 2 MG tablet Take 2 mg by mouth 2 (two) times daily before a meal.    [provider]  Homeopathic Products (EQL EAR DROPS) SOLN Place 5 drops into the right ear daily.    [provider]  HUMULIN N 100 UNIT/ML injection Inject 15-25 Units into the skin See admin instructions. 25 units in the morning before breakfast and 15 units at bedtime 12/15/16   [provider]  ondansetron (ZOFRAN-ODT) 4 MG disintegrating tablet Take 4 mg by mouth every 8 (eight) hours as needed for nausea/vomiting. DISSOLVE IN THE MOUTH 12/07/16   [provider]   Allergies  Allergen Reactions  . Monosodium Glutamate Anaphylaxis    MSG  . Red Dye Anaphylaxis  . Erythromycin Other (See Comments)    Reaction:  Migraine   . Penicillins Other (See Comments)    Reaction:  Migraine  Has patient had a PCN reaction causing immediate rash, facial/tongue/throat swelling, SOB or lightheadedness with hypotension: No Has patient had a PCN  reaction causing severe rash involving mucus membranes or skin necrosis: No Has patient had a PCN reaction that required hospitalization No Has patient had a PCN reaction occurring within the last 10 years: No If all of the above answers are "NO", then may proceed with Cephalosporin use.  Marland Kitchen  Statins Other (See Comments)    Reaction:  Severe leg cramps   . Sulfa Antibiotics Hives  . Glipizide Rash  . Iohexol Rash  . Tape Rash    MEDICAL "PLASTIC" TAPE CAUSES BLISTERS!!! PLEASE USE AN ALTERNATIVE   Review of Systems  Unable to perform ROS: Acuity of condition   Physical Exam  Constitutional: She appears lethargic. She appears distressed.  HENT:  Head: Normocephalic and atraumatic.  Cardiovascular: Regular rhythm.  Pulmonary/Chest: She is in respiratory distress. She has rhonchi.  Irregular respiration, periods of apnea. Audible secretions that are causing distress.  Neurological: She appears lethargic.  Psychiatric: She is agitated. She is noncommunicative.  Nursing note and vitals reviewed.  Vital Signs: BP 108/80 (BP Location: Right Arm)   Pulse 88   Temp 98 F (36.7 C) (Axillary)   Resp 18   SpO2 98%  Pain Assessment: PAINAD     SpO2: SpO2: 98 % O2 Device:SpO2: 98 % O2 Flow Rate: .   IO: Intake/output summary:   Intake/Output Summary (Last 24 hours) at 2017/05/03 1010 Last data filed at 04/05/2017 2100 Gross per 24 hour  Intake 11.8 ml  Output -  Net 11.8 ml    LBM:   Baseline Weight:   Most recent weight:       Palliative Assessment/Data: PPS 10%   Flowsheet Rows     Most Recent Value  Intake Tab  Referral Department  Hospitalist  Unit at Time of Referral  Med/Surg Unit  Palliative Care Primary Diagnosis  -- [decompensated cirrhosis, ARF]  Palliative Care Type  Return patient Palliative Care  Date first seen by Palliative Care  03-May-2017  Clinical Assessment  Palliative Performance Scale Score  10%  Psychosocial & Spiritual Assessment  Palliative Care  Outcomes  Patient/Family meeting held?  Yes  Who was at the meeting?  son and two [place] goals of care, Provided end of life care assistance, Provided psychosocial or spiritual support, Improved pain interventions, Improved non-pain symptom therapy, Counseled regarding hospice      Time In: 0840 Time Out: 1000 Time Total: 84mn Greater than 50%  of this time was spent counseling and coordinating care related to the above assessment and plan.  Signed by:  MIhor Dow FNP-C Palliative Medicine Team  Phone: [phone]: [phone]  Please contact Palliative Medicine Team phone at [phone] questions and concerns.  For individual provider: See AShea Evans

## 2017-04-15 DEATH — deceased

## 2018-02-07 IMAGING — US IR PARACENTESIS
1 series · 2 of 2 positions shown · non-contrast
Comparison: none

INDICATION: Patient with recurrent ascites. Request is made for therapeutic
paracentesis of up to 9 liters. Patient to receive 50 g albumin with
procedure.

[Series 1: ir (id) (id)/(id)/(id) ir · 2 of 2 slices shown]
[im 1/2]
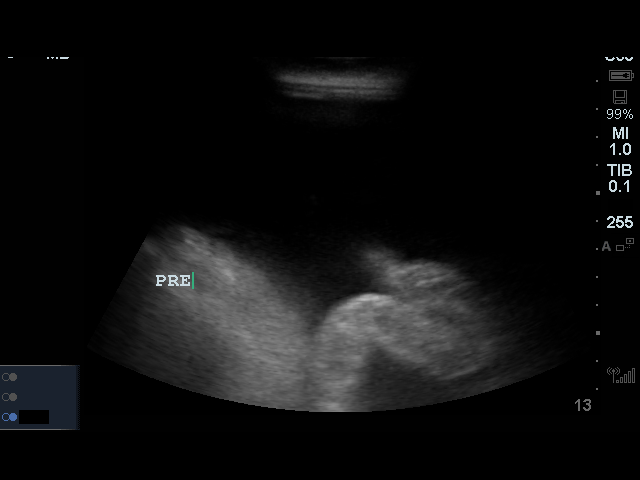
[im 2/2]
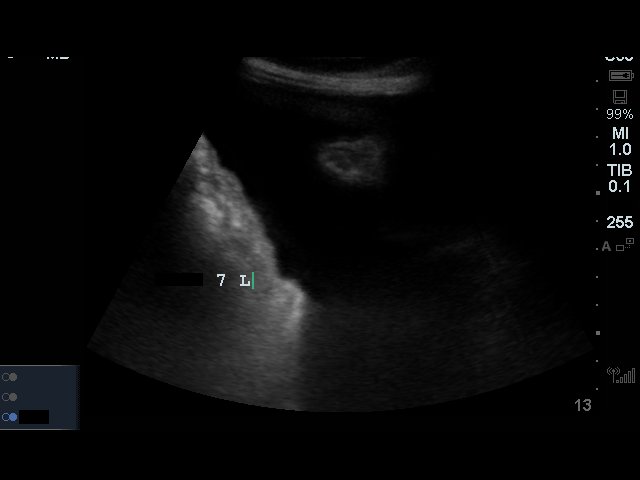

[2 of 2 positions shown; findings below may reference images not displayed]

EXAM:
ULTRASOUND GUIDED THERAPEUTIC PARACENTESIS

MEDICATIONS:
10 mL 1% lidocaine

COMPLICATIONS:
None immediate.

PROCEDURE:
Informed written consent was obtained from the patient after a
discussion of the risks, benefits and alternatives to treatment. A
timeout was performed prior to the initiation of the procedure.

Initial ultrasound scanning demonstrates a large amount of ascites
within the right lateral abdomen. The right lateral abdomen was
prepped and draped in the usual sterile fashion. 1% lidocaine was
used for local anesthesia.

Following this, a 19 gauge, 7-cm, Yueh catheter was introduced. An
ultrasound image was saved for documentation purposes. The
paracentesis was performed. The catheter was removed and a dressing
was applied. The patient tolerated the procedure well without
immediate post procedural complication.
FINDINGS: A total of approximately 7.0 liters of chylous fluid was removed.
IMPRESSION: Successful ultrasound-guided therapeutic paracentesis yielding
liters of peritoneal fluid.

## 2018-03-26 IMAGING — US IR PARACENTESIS
1 series · 1 of 1 positions shown · non-contrast
Comparison: none

INDICATION: Abdominal distention secondary to recurrent ascites. Request
therapeutic paracentesis.

[Series 1: ir (id) (id)/(id)/(id) ir · 1 of 1 slices shown]
[im 1/1]
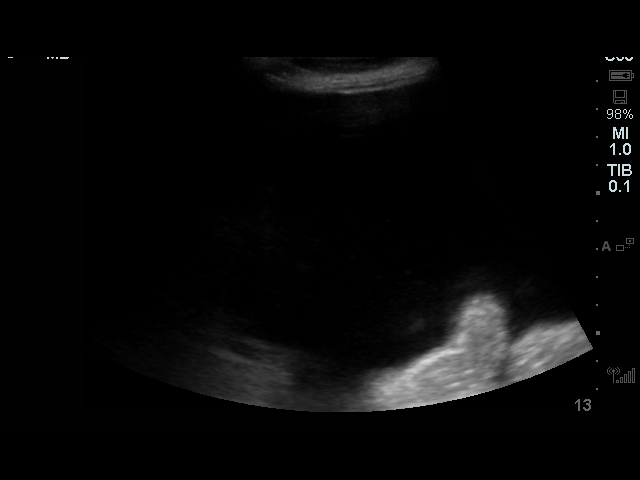

[1 of 1 positions shown; findings below may reference images not displayed]

EXAM:
ULTRASOUND GUIDED LEFT LOWER QUADRANT PARACENTESIS

MEDICATIONS:
None.

COMPLICATIONS:
None immediate.

PROCEDURE:
Informed written consent was obtained from the patient after a
discussion of the risks, benefits and alternatives to treatment. A
timeout was performed prior to the initiation of the procedure.

Initial ultrasound scanning demonstrates a large amount of ascites
within the left lower abdominal quadrant. The left lower abdomen was
prepped and draped in the usual sterile fashion. 1% lidocaine with
epinephrine was used for local anesthesia.

Following this, a 19 gauge, 7-cm, Yueh catheter was introduced. An
ultrasound image was saved for documentation purposes. The
paracentesis was performed. The catheter was removed and a dressing
was applied. The patient tolerated the procedure well without
immediate post procedural complication.
FINDINGS: A total of approximately 9 L of milky yellow fluid was removed.
IMPRESSION: Successful ultrasound-guided paracentesis yielding 9 liters of
peritoneal fluid.

## 2018-04-08 IMAGING — US IR PARACENTESIS
1 series · 3 of 3 positions shown · non-contrast
Comparison: none

INDICATION: Patient with a history of NASH cirrhosis with recurrent abdominal.
Request is made for therapeutic paracentesis up to 9 L. the patient
is to get 50 g of albumin over 5 L.

[Series 1: ir (id) (id)/(id)/(id) ir · 3 of 3 slices shown]
[im 1/3]
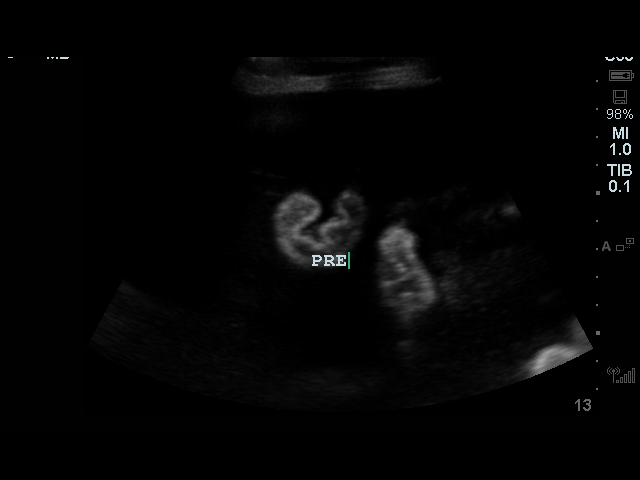
[im 2/3]
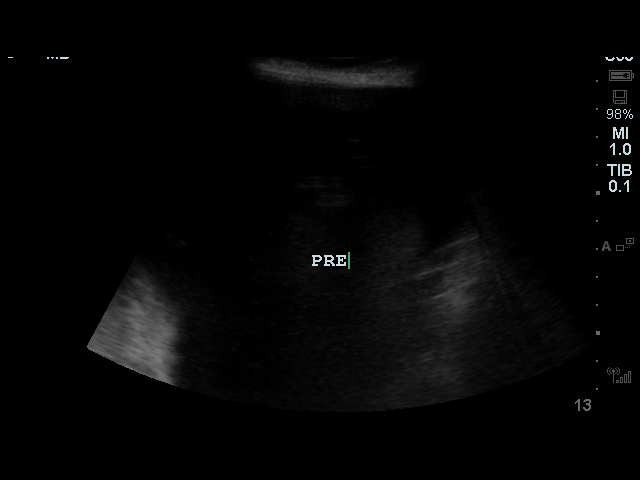
[im 3/3]
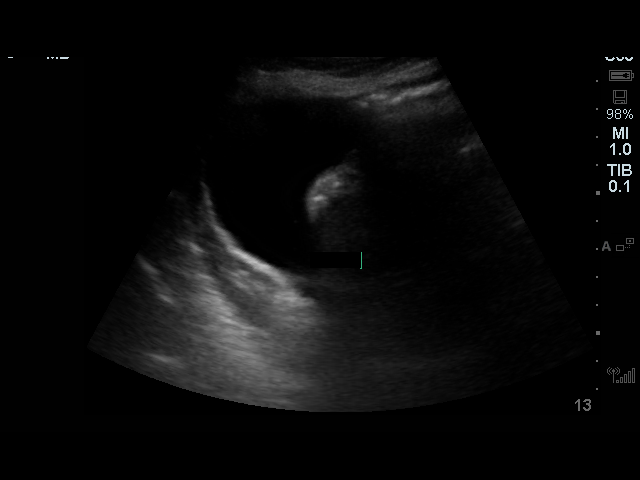

[3 of 3 positions shown; findings below may reference images not displayed]

EXAM:
ULTRASOUND GUIDED THERAPEUTIC PARACENTESIS

MEDICATIONS:
1% lidocaine

COMPLICATIONS:
None immediate.

PROCEDURE:
Informed written consent was obtained from the patient after a
discussion of the risks, benefits and alternatives to treatment. A
timeout was performed prior to the initiation of the procedure.

Initial ultrasound scanning demonstrates a large amount of ascites
within the right lower abdominal quadrant. The right lower abdomen
was prepped and draped in the usual sterile fashion. 1% lidocaine
was used for local anesthesia.

Following this, a 19 gauge, 7-cm, Yueh catheter was introduced. An
ultrasound image was saved for documentation purposes. The
paracentesis was performed. The catheter was removed and a dressing
was applied. The patient tolerated the procedure well without
immediate post procedural complication.
FINDINGS: A total of approximately 9 L of chylous fluid was removed.
IMPRESSION: Successful ultrasound-guided paracentesis yielding 9 liters of
peritoneal fluid.

## 2018-04-25 IMAGING — US IR PARACENTESIS
1 series · 2 of 2 positions shown · non-contrast
Comparison: none

INDICATION: Patient with recurrent ascites. Request is made for therapeutic
paracentesis of up to 9 liters. The patient to receive 50 g albumin
with procedure today.

[Series 1: ir (id) (id)/(id)/(id) ir · 2 of 2 slices shown]
[im 1/2]
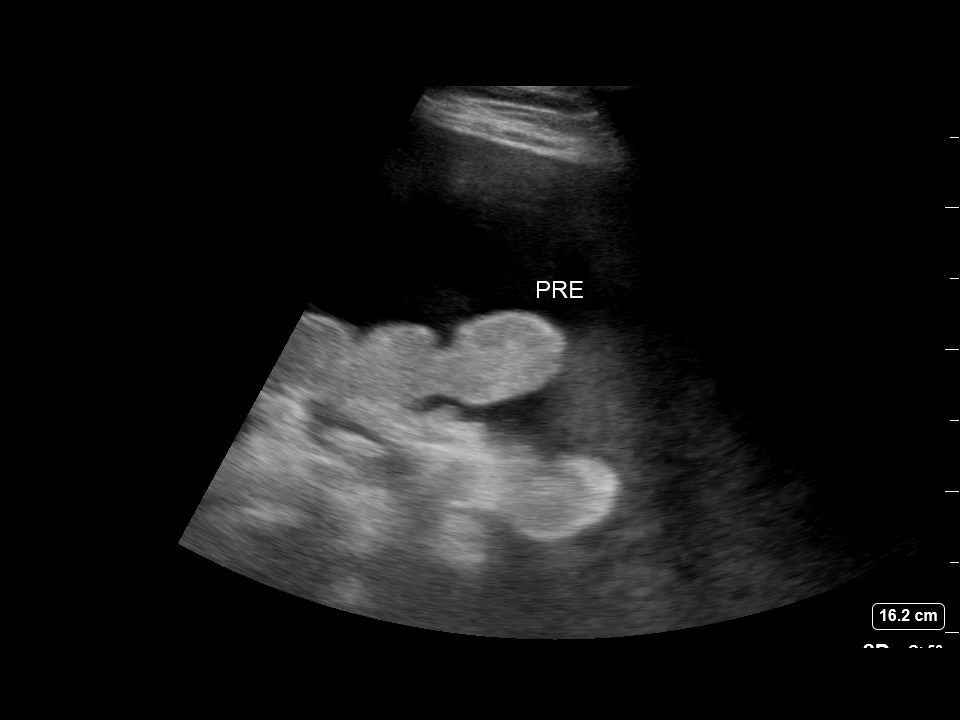
[im 2/2]
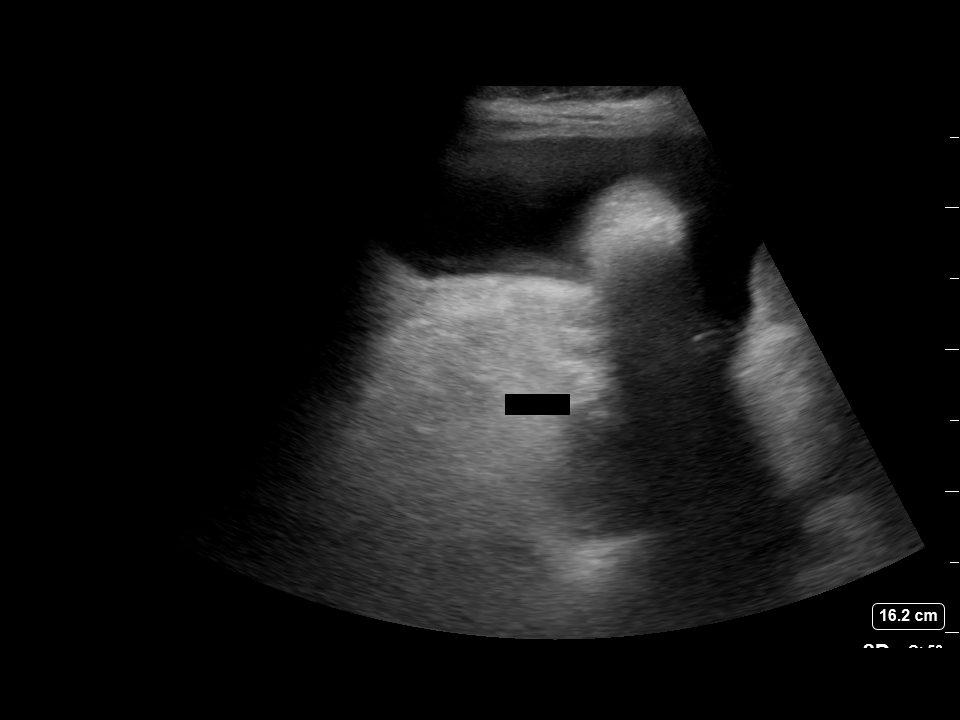

[2 of 2 positions shown; findings below may reference images not displayed]

EXAM:
ULTRASOUND GUIDED THERAPEUTIC PARACENTESIS

MEDICATIONS:
10 mL 2% lidocaine

COMPLICATIONS:
None immediate.

PROCEDURE:
Informed written consent was obtained from the patient after a
discussion of the risks, benefits and alternatives to treatment. A
timeout was performed prior to the initiation of the procedure.

Initial ultrasound scanning demonstrates a large amount of ascites
within the right lateral abdomen. The right lateral abdomen was
prepped and draped in the usual sterile fashion. 2% lidocaine was
used for local anesthesia.

Following this, a 19 gauge, 7-cm, Yueh catheter was introduced. An
ultrasound image was saved for documentation purposes. The
paracentesis was performed. The catheter was removed and a dressing
was applied. The patient tolerated the procedure well without
immediate post procedural complication.
FINDINGS: A total of approximately 5.7 liters of chylous fluid was removed.
Patient experienced a drop in her blood pressure. Procedure was
stopped and patient was given a of 500 mL fluid bolus which resulted
in improvement in her blood pressure. Procedure was stopped prior to
removal of all fluid.
IMPRESSION: Successful ultrasound-guided therapeutic paracentesis yielding
liters of peritoneal fluid.

## 2018-06-14 IMAGING — US IR PERC TUN PERIT CATH WO PORT S&I /IMAG
1 series · 2 of 2 positions shown · non-contrast
Comparison: none

CLINICAL DATA: Refractory ascites secondary to cirrhosis of the
liver. Placement of tunneled peritoneal drainage catheter has been
requested due to problems with significant hypotension with recent
periodic large volume paracentesis procedures and overall poor
prognosis. The last paracentesis procedure was performed on
02/15/2017 yielding 8 L of fluid.

[Series 1: ir (id) (id)/(id)/(id) ir · 2 of 2 slices shown]
[im 1/2]
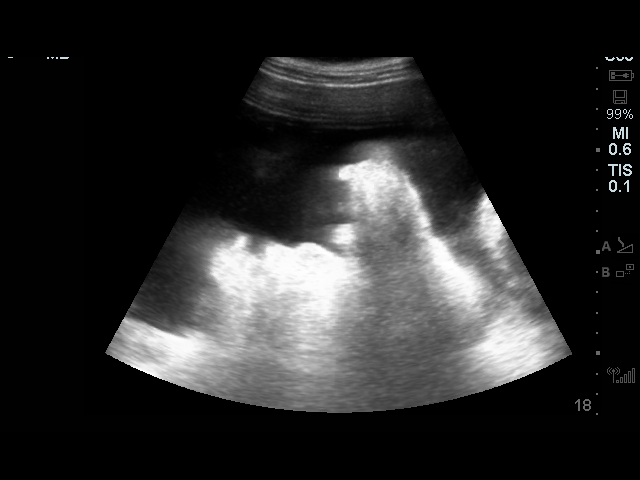
[im 2/2]
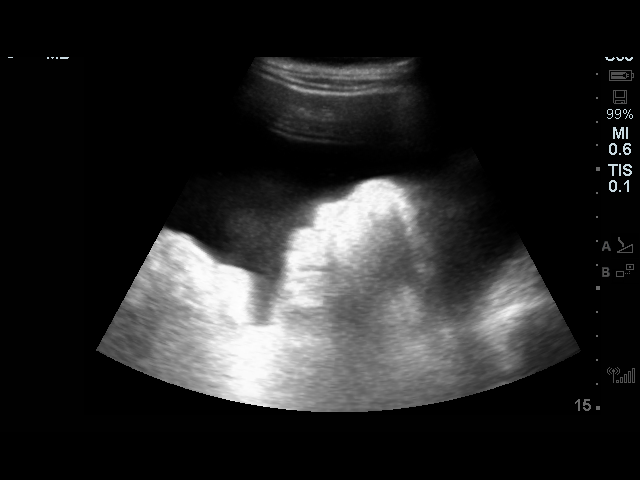

[2 of 2 positions shown; findings below may reference images not displayed]

EXAM:
INSERTION OF TUNNELED PERITONEAL DRAINAGE CATHETER

ANESTHESIA/SEDATION:
2.0 Mg IV Versed; 100 mcg IV Fentanyl.

Total Moderate Sedation Time

16 minutes.

The patient's level of consciousness and physiologic status were
continuously monitored during the procedure by Radiology nursing.

MEDICATIONS:
1 gram IV vancomycin. Antibiotic was administered in an appropriate
time interval for the procedure.

FLUOROSCOPY TIME:  6 seconds.  3.2 mGy.

PROCEDURE:
The procedure, risks, benefits, and alternatives were explained to
the patient. Questions regarding the procedure were encouraged and
answered. The patient understands and consents to the procedure. A
time-out was performed prior to initiating the procedure.

The right abdominal wall was prepped with chlorhexidine in a sterile
fashion, and a sterile drape was applied covering the operative
field. A sterile gown and sterile gloves were used for the
procedure. Local anesthesia was provided with 1% Lidocaine.
Ultrasound image documentation was performed. Fluoroscopy during the
procedure and fluoroscopic spot radiograph confirms appropriate
catheter position.

After creating a small skin incision, a 19 gauge needle was advanced
into the peritoneal cavity under ultrasound guidance. A guide wire
was then advanced under fluoroscopy into the peritoneal cavity.
Peritoneal access was dilated serially and a 16-French peel-away
sheath placed.

A [DATE] French tunneled PleurX catheter was placed. This was tunneled
from an incision 5 cm below the peritoneal access to the access
site. The catheter was advanced through the peel-away sheath. The
sheath was then removed. Final catheter positioning was confirmed
with a fluoroscopic spot image.

The peritoneal access incision was closed with subcutaneous 3-0
Monocryl and subcuticular 4-0 Vicryl. Dermabond was applied to the
incision. A Prolene retention suture was applied at the catheter
exit site. Large volume paracentesis was performed through the new
catheter utilizing drainage bottles.

COMPLICATIONS:
None.
FINDINGS: The catheter was placed via the right abdominal wall. Approximately
4.8 liters of ascites was able to be removed after catheter
placement.
IMPRESSION: Placement of tunneled peritoneal drainage catheter via right
abdominal approach. 4.8 liters of ascites was removed today after
catheter placement.
